# Patient Record
Sex: Male | Born: 1945 | Race: White | Hispanic: No | Smoking: Never smoker
Health system: Southern US, Community
[De-identification: ages and names within clinical notes are randomized; demographics above are authoritative.]

## PROBLEM LIST (undated history)

## (undated) DIAGNOSIS — D129 Benign neoplasm of anus and anal canal: Secondary | ICD-10-CM

## (undated) DIAGNOSIS — Z9989 Dependence on other enabling machines and devices: Secondary | ICD-10-CM

## (undated) DIAGNOSIS — Z862 Personal history of diseases of the blood and blood-forming organs and certain disorders involving the immune mechanism: Secondary | ICD-10-CM

## (undated) DIAGNOSIS — M25569 Pain in unspecified knee: Secondary | ICD-10-CM

## (undated) DIAGNOSIS — G4733 Obstructive sleep apnea (adult) (pediatric): Secondary | ICD-10-CM

## (undated) DIAGNOSIS — N400 Enlarged prostate without lower urinary tract symptoms: Secondary | ICD-10-CM

## (undated) DIAGNOSIS — R0602 Shortness of breath: Secondary | ICD-10-CM

## (undated) DIAGNOSIS — E119 Type 2 diabetes mellitus without complications: Secondary | ICD-10-CM

## (undated) DIAGNOSIS — R51 Headache: Secondary | ICD-10-CM

## (undated) DIAGNOSIS — M549 Dorsalgia, unspecified: Secondary | ICD-10-CM

## (undated) DIAGNOSIS — F32A Depression, unspecified: Secondary | ICD-10-CM

## (undated) DIAGNOSIS — I639 Cerebral infarction, unspecified: Secondary | ICD-10-CM

## (undated) DIAGNOSIS — R109 Unspecified abdominal pain: Secondary | ICD-10-CM

## (undated) DIAGNOSIS — M1990 Unspecified osteoarthritis, unspecified site: Secondary | ICD-10-CM

## (undated) DIAGNOSIS — IMO0001 Reserved for inherently not codable concepts without codable children: Secondary | ICD-10-CM

## (undated) DIAGNOSIS — Z8639 Personal history of other endocrine, nutritional and metabolic disease: Secondary | ICD-10-CM

## (undated) DIAGNOSIS — D128 Benign neoplasm of rectum: Secondary | ICD-10-CM

## (undated) DIAGNOSIS — I509 Heart failure, unspecified: Secondary | ICD-10-CM

## (undated) DIAGNOSIS — I4891 Unspecified atrial fibrillation: Secondary | ICD-10-CM

## (undated) DIAGNOSIS — F329 Major depressive disorder, single episode, unspecified: Secondary | ICD-10-CM

## (undated) DIAGNOSIS — K219 Gastro-esophageal reflux disease without esophagitis: Secondary | ICD-10-CM

## (undated) DIAGNOSIS — K5730 Diverticulosis of large intestine without perforation or abscess without bleeding: Secondary | ICD-10-CM

## (undated) DIAGNOSIS — G8929 Other chronic pain: Secondary | ICD-10-CM

## (undated) DIAGNOSIS — F419 Anxiety disorder, unspecified: Secondary | ICD-10-CM

## (undated) DIAGNOSIS — H409 Unspecified glaucoma: Secondary | ICD-10-CM

## (undated) DIAGNOSIS — R609 Edema, unspecified: Secondary | ICD-10-CM

## (undated) DIAGNOSIS — M2550 Pain in unspecified joint: Secondary | ICD-10-CM

## (undated) DIAGNOSIS — C61 Malignant neoplasm of prostate: Secondary | ICD-10-CM

## (undated) DIAGNOSIS — Z972 Presence of dental prosthetic device (complete) (partial): Secondary | ICD-10-CM

## (undated) DIAGNOSIS — J189 Pneumonia, unspecified organism: Secondary | ICD-10-CM

## (undated) DIAGNOSIS — I10 Essential (primary) hypertension: Secondary | ICD-10-CM

## (undated) DIAGNOSIS — G43D0 Abdominal migraine, not intractable: Secondary | ICD-10-CM

## (undated) DIAGNOSIS — Z9889 Other specified postprocedural states: Secondary | ICD-10-CM

## (undated) DIAGNOSIS — K429 Umbilical hernia without obstruction or gangrene: Secondary | ICD-10-CM

## (undated) DIAGNOSIS — C4490 Unspecified malignant neoplasm of skin, unspecified: Secondary | ICD-10-CM

## (undated) DIAGNOSIS — I6529 Occlusion and stenosis of unspecified carotid artery: Secondary | ICD-10-CM

## (undated) DIAGNOSIS — E785 Hyperlipidemia, unspecified: Secondary | ICD-10-CM

## (undated) HISTORY — DX: Occlusion and stenosis of unspecified carotid artery: I65.29

## (undated) HISTORY — DX: Dorsalgia, unspecified: M54.9

## (undated) HISTORY — PX: TONSILLECTOMY: SUR1361

## (undated) HISTORY — PX: PROSTATE BIOPSY: SHX241

## (undated) HISTORY — DX: Depression, unspecified: F32.A

## (undated) HISTORY — DX: Dependence on other enabling machines and devices: Z99.89

## (undated) HISTORY — DX: Personal history of diseases of the blood and blood-forming organs and certain disorders involving the immune mechanism: Z86.2

## (undated) HISTORY — DX: Gastro-esophageal reflux disease without esophagitis: K21.9

## (undated) HISTORY — DX: Pain in unspecified joint: M25.50

## (undated) HISTORY — DX: Benign neoplasm of anus and anal canal: D12.9

## (undated) HISTORY — DX: Unspecified osteoarthritis, unspecified site: M19.90

## (undated) HISTORY — DX: Shortness of breath: R06.02

## (undated) HISTORY — PX: FRACTURE SURGERY: SHX138

## (undated) HISTORY — PX: OTHER SURGICAL HISTORY: SHX169

## (undated) HISTORY — PX: CARDIAC CATHETERIZATION: SHX172

## (undated) HISTORY — DX: Pain in unspecified knee: M25.569

## (undated) HISTORY — DX: Edema, unspecified: R60.9

## (undated) HISTORY — PX: SHOULDER ARTHROSCOPY: SHX128

## (undated) HISTORY — PX: CAROTID ENDARTERECTOMY: SUR193

## (undated) HISTORY — PX: APPENDECTOMY: SHX54

## (undated) HISTORY — PX: HERNIA REPAIR: SHX51

## (undated) HISTORY — DX: Anxiety disorder, unspecified: F41.9

## (undated) HISTORY — DX: Hyperlipidemia, unspecified: E78.5

## (undated) HISTORY — DX: Heart failure, unspecified: I50.9

## (undated) HISTORY — DX: Type 2 diabetes mellitus without complications: E11.9

## (undated) HISTORY — DX: Obstructive sleep apnea (adult) (pediatric): G47.33

## (undated) HISTORY — DX: Personal history of diseases of the blood and blood-forming organs and certain disorders involving the immune mechanism: Z86.39

## (undated) HISTORY — DX: Essential (primary) hypertension: I10

## (undated) HISTORY — DX: Malignant neoplasm of prostate: C61

## (undated) HISTORY — DX: Unspecified glaucoma: H40.9

## (undated) HISTORY — DX: Benign neoplasm of rectum: D12.8

## (undated) HISTORY — DX: Diverticulosis of large intestine without perforation or abscess without bleeding: K57.30

## (undated) HISTORY — DX: Major depressive disorder, single episode, unspecified: F32.9

---

## 1989-08-01 HISTORY — PX: LEG SURGERY: SHX1003

## 2000-02-19 ENCOUNTER — Inpatient Hospital Stay (HOSPITAL_COMMUNITY): Admission: EM | Admit: 2000-02-19 | Discharge: 2000-02-23 | Payer: Self-pay | Admitting: Family Medicine

## 2000-02-19 ENCOUNTER — Encounter: Payer: Self-pay | Admitting: Family Medicine

## 2000-02-19 ENCOUNTER — Encounter: Payer: Self-pay | Admitting: Emergency Medicine

## 2000-02-29 ENCOUNTER — Encounter: Admission: RE | Admit: 2000-02-29 | Discharge: 2000-02-29 | Payer: Self-pay | Admitting: Family Medicine

## 2000-03-03 ENCOUNTER — Encounter: Admission: RE | Admit: 2000-03-03 | Discharge: 2000-03-03 | Payer: Self-pay | Admitting: Family Medicine

## 2000-04-05 ENCOUNTER — Encounter: Admission: RE | Admit: 2000-04-05 | Discharge: 2000-04-05 | Payer: Self-pay | Admitting: Family Medicine

## 2000-05-04 ENCOUNTER — Encounter: Admission: RE | Admit: 2000-05-04 | Discharge: 2000-05-04 | Payer: Self-pay | Admitting: Family Medicine

## 2000-05-31 ENCOUNTER — Encounter: Admission: RE | Admit: 2000-05-31 | Discharge: 2000-05-31 | Payer: Self-pay | Admitting: Family Medicine

## 2000-06-30 ENCOUNTER — Encounter: Admission: RE | Admit: 2000-06-30 | Discharge: 2000-06-30 | Payer: Self-pay | Admitting: *Deleted

## 2000-06-30 ENCOUNTER — Encounter: Admission: RE | Admit: 2000-06-30 | Discharge: 2000-06-30 | Payer: Self-pay | Admitting: Family Medicine

## 2000-06-30 ENCOUNTER — Encounter: Payer: Self-pay | Admitting: *Deleted

## 2000-07-11 ENCOUNTER — Encounter: Admission: RE | Admit: 2000-07-11 | Discharge: 2000-07-11 | Payer: Self-pay | Admitting: Family Medicine

## 2000-08-01 HISTORY — PX: SHOULDER ARTHROSCOPY: SHX128

## 2000-08-31 ENCOUNTER — Encounter: Admission: RE | Admit: 2000-08-31 | Discharge: 2000-08-31 | Payer: Self-pay | Admitting: Family Medicine

## 2000-09-26 ENCOUNTER — Encounter: Admission: RE | Admit: 2000-09-26 | Discharge: 2000-09-26 | Payer: Self-pay | Admitting: Family Medicine

## 2000-10-02 ENCOUNTER — Encounter: Admission: RE | Admit: 2000-10-02 | Discharge: 2000-10-02 | Payer: Self-pay | Admitting: Family Medicine

## 2000-10-23 ENCOUNTER — Encounter: Admission: RE | Admit: 2000-10-23 | Discharge: 2000-10-23 | Payer: Self-pay | Admitting: Family Medicine

## 2000-11-21 ENCOUNTER — Encounter: Admission: RE | Admit: 2000-11-21 | Discharge: 2000-11-21 | Payer: Self-pay | Admitting: Family Medicine

## 2000-12-12 ENCOUNTER — Inpatient Hospital Stay (HOSPITAL_COMMUNITY): Admission: EM | Admit: 2000-12-12 | Discharge: 2000-12-14 | Payer: Self-pay | Admitting: Emergency Medicine

## 2000-12-12 ENCOUNTER — Encounter: Payer: Self-pay | Admitting: Emergency Medicine

## 2000-12-21 ENCOUNTER — Encounter: Admission: RE | Admit: 2000-12-21 | Discharge: 2000-12-21 | Payer: Self-pay | Admitting: Family Medicine

## 2000-12-29 ENCOUNTER — Encounter: Admission: RE | Admit: 2000-12-29 | Discharge: 2000-12-29 | Payer: Self-pay | Admitting: Family Medicine

## 2001-03-30 ENCOUNTER — Encounter: Admission: RE | Admit: 2001-03-30 | Discharge: 2001-03-30 | Payer: Self-pay | Admitting: Family Medicine

## 2001-04-16 ENCOUNTER — Encounter: Admission: RE | Admit: 2001-04-16 | Discharge: 2001-04-16 | Payer: Self-pay | Admitting: Family Medicine

## 2001-05-02 ENCOUNTER — Encounter: Admission: RE | Admit: 2001-05-02 | Discharge: 2001-05-02 | Payer: Self-pay | Admitting: Sports Medicine

## 2001-05-16 ENCOUNTER — Encounter: Admission: RE | Admit: 2001-05-16 | Discharge: 2001-05-16 | Payer: Self-pay | Admitting: Family Medicine

## 2001-05-18 ENCOUNTER — Encounter: Admission: RE | Admit: 2001-05-18 | Discharge: 2001-05-18 | Payer: Self-pay | Admitting: Family Medicine

## 2001-05-18 ENCOUNTER — Encounter: Payer: Self-pay | Admitting: Sports Medicine

## 2001-05-30 ENCOUNTER — Encounter: Admission: RE | Admit: 2001-05-30 | Discharge: 2001-05-30 | Payer: Self-pay | Admitting: Family Medicine

## 2001-06-06 ENCOUNTER — Encounter: Admission: RE | Admit: 2001-06-06 | Discharge: 2001-06-06 | Payer: Self-pay | Admitting: Family Medicine

## 2001-06-19 ENCOUNTER — Encounter: Admission: RE | Admit: 2001-06-19 | Discharge: 2001-06-19 | Payer: Self-pay | Admitting: Family Medicine

## 2001-06-22 ENCOUNTER — Encounter: Admission: RE | Admit: 2001-06-22 | Discharge: 2001-06-22 | Payer: Self-pay | Admitting: Family Medicine

## 2001-07-19 ENCOUNTER — Encounter: Admission: RE | Admit: 2001-07-19 | Discharge: 2001-07-19 | Payer: Self-pay | Admitting: Family Medicine

## 2001-08-02 ENCOUNTER — Encounter: Admission: RE | Admit: 2001-08-02 | Discharge: 2001-08-02 | Payer: Self-pay | Admitting: Family Medicine

## 2001-08-15 ENCOUNTER — Encounter: Admission: RE | Admit: 2001-08-15 | Discharge: 2001-08-15 | Payer: Self-pay | Admitting: Family Medicine

## 2001-08-29 ENCOUNTER — Encounter: Admission: RE | Admit: 2001-08-29 | Discharge: 2001-08-29 | Payer: Self-pay | Admitting: Family Medicine

## 2001-09-13 ENCOUNTER — Encounter: Admission: RE | Admit: 2001-09-13 | Discharge: 2001-09-13 | Payer: Self-pay | Admitting: Family Medicine

## 2001-10-03 ENCOUNTER — Encounter: Admission: RE | Admit: 2001-10-03 | Discharge: 2001-10-03 | Payer: Self-pay | Admitting: Family Medicine

## 2001-10-17 ENCOUNTER — Encounter: Admission: RE | Admit: 2001-10-17 | Discharge: 2001-10-17 | Payer: Self-pay | Admitting: Family Medicine

## 2001-10-29 ENCOUNTER — Encounter: Admission: RE | Admit: 2001-10-29 | Discharge: 2001-10-29 | Payer: Self-pay | Admitting: Family Medicine

## 2001-12-13 ENCOUNTER — Encounter: Admission: RE | Admit: 2001-12-13 | Discharge: 2001-12-13 | Payer: Self-pay | Admitting: Family Medicine

## 2001-12-14 ENCOUNTER — Encounter: Admission: RE | Admit: 2001-12-14 | Discharge: 2001-12-14 | Payer: Self-pay | Admitting: Family Medicine

## 2001-12-18 ENCOUNTER — Encounter: Admission: RE | Admit: 2001-12-18 | Discharge: 2001-12-18 | Payer: Self-pay | Admitting: Family Medicine

## 2002-01-02 ENCOUNTER — Encounter: Admission: RE | Admit: 2002-01-02 | Discharge: 2002-01-02 | Payer: Self-pay | Admitting: Family Medicine

## 2002-01-17 ENCOUNTER — Encounter: Admission: RE | Admit: 2002-01-17 | Discharge: 2002-01-17 | Payer: Self-pay | Admitting: Family Medicine

## 2002-02-13 ENCOUNTER — Encounter: Admission: RE | Admit: 2002-02-13 | Discharge: 2002-02-13 | Payer: Self-pay | Admitting: Family Medicine

## 2002-02-21 ENCOUNTER — Encounter: Admission: RE | Admit: 2002-02-21 | Discharge: 2002-02-21 | Payer: Self-pay | Admitting: Sports Medicine

## 2002-03-06 ENCOUNTER — Encounter: Admission: RE | Admit: 2002-03-06 | Discharge: 2002-03-06 | Payer: Self-pay | Admitting: Family Medicine

## 2002-03-20 ENCOUNTER — Encounter: Admission: RE | Admit: 2002-03-20 | Discharge: 2002-03-20 | Payer: Self-pay | Admitting: Family Medicine

## 2002-04-17 ENCOUNTER — Encounter: Admission: RE | Admit: 2002-04-17 | Discharge: 2002-04-17 | Payer: Self-pay | Admitting: Family Medicine

## 2002-05-01 ENCOUNTER — Encounter: Admission: RE | Admit: 2002-05-01 | Discharge: 2002-05-01 | Payer: Self-pay | Admitting: Family Medicine

## 2002-05-16 ENCOUNTER — Encounter: Admission: RE | Admit: 2002-05-16 | Discharge: 2002-05-16 | Payer: Self-pay | Admitting: Family Medicine

## 2002-05-22 ENCOUNTER — Encounter: Admission: RE | Admit: 2002-05-22 | Discharge: 2002-05-22 | Payer: Self-pay | Admitting: Family Medicine

## 2002-06-21 ENCOUNTER — Encounter: Admission: RE | Admit: 2002-06-21 | Discharge: 2002-06-21 | Payer: Self-pay | Admitting: Family Medicine

## 2002-07-04 ENCOUNTER — Encounter: Admission: RE | Admit: 2002-07-04 | Discharge: 2002-07-04 | Payer: Self-pay | Admitting: Sports Medicine

## 2002-08-19 ENCOUNTER — Encounter: Admission: RE | Admit: 2002-08-19 | Discharge: 2002-08-19 | Payer: Self-pay | Admitting: Sports Medicine

## 2002-08-30 ENCOUNTER — Encounter: Admission: RE | Admit: 2002-08-30 | Discharge: 2002-08-30 | Payer: Self-pay | Admitting: Family Medicine

## 2002-12-16 ENCOUNTER — Inpatient Hospital Stay (HOSPITAL_COMMUNITY): Admission: EM | Admit: 2002-12-16 | Discharge: 2002-12-18 | Payer: Self-pay | Admitting: Emergency Medicine

## 2002-12-18 ENCOUNTER — Encounter: Payer: Self-pay | Admitting: Sports Medicine

## 2002-12-25 ENCOUNTER — Encounter: Admission: RE | Admit: 2002-12-25 | Discharge: 2002-12-25 | Payer: Self-pay | Admitting: Family Medicine

## 2002-12-31 ENCOUNTER — Encounter: Admission: RE | Admit: 2002-12-31 | Discharge: 2002-12-31 | Payer: Self-pay | Admitting: Family Medicine

## 2003-01-23 ENCOUNTER — Encounter: Admission: RE | Admit: 2003-01-23 | Discharge: 2003-01-23 | Payer: Self-pay | Admitting: Family Medicine

## 2003-02-17 ENCOUNTER — Encounter: Payer: Self-pay | Admitting: Otolaryngology

## 2003-02-17 ENCOUNTER — Ambulatory Visit (HOSPITAL_COMMUNITY): Admission: RE | Admit: 2003-02-17 | Discharge: 2003-02-17 | Payer: Self-pay | Admitting: Otolaryngology

## 2003-06-09 ENCOUNTER — Encounter: Admission: RE | Admit: 2003-06-09 | Discharge: 2003-06-09 | Payer: Self-pay | Admitting: Family Medicine

## 2003-09-16 ENCOUNTER — Encounter: Admission: RE | Admit: 2003-09-16 | Discharge: 2003-09-16 | Payer: Self-pay | Admitting: Family Medicine

## 2003-10-13 ENCOUNTER — Encounter: Admission: RE | Admit: 2003-10-13 | Discharge: 2003-10-13 | Payer: Self-pay | Admitting: Family Medicine

## 2003-12-09 ENCOUNTER — Encounter: Admission: RE | Admit: 2003-12-09 | Discharge: 2003-12-09 | Payer: Self-pay | Admitting: Family Medicine

## 2004-01-15 ENCOUNTER — Ambulatory Visit (HOSPITAL_COMMUNITY): Admission: RE | Admit: 2004-01-15 | Discharge: 2004-01-15 | Payer: Self-pay | Admitting: Preventative Medicine

## 2004-02-20 ENCOUNTER — Encounter: Admission: RE | Admit: 2004-02-20 | Discharge: 2004-02-20 | Payer: Self-pay | Admitting: Family Medicine

## 2004-03-10 ENCOUNTER — Ambulatory Visit (HOSPITAL_COMMUNITY): Admission: RE | Admit: 2004-03-10 | Discharge: 2004-03-10 | Payer: Self-pay | Admitting: Gastroenterology

## 2004-03-10 ENCOUNTER — Encounter (INDEPENDENT_AMBULATORY_CARE_PROVIDER_SITE_OTHER): Payer: Self-pay | Admitting: *Deleted

## 2004-03-10 DIAGNOSIS — K5730 Diverticulosis of large intestine without perforation or abscess without bleeding: Secondary | ICD-10-CM

## 2004-03-10 DIAGNOSIS — D128 Benign neoplasm of rectum: Secondary | ICD-10-CM

## 2004-03-10 HISTORY — DX: Benign neoplasm of rectum: D12.8

## 2004-03-10 HISTORY — DX: Diverticulosis of large intestine without perforation or abscess without bleeding: K57.30

## 2004-08-01 HISTORY — PX: TENDON REPAIR: SHX5111

## 2004-12-22 ENCOUNTER — Ambulatory Visit (HOSPITAL_COMMUNITY): Admission: RE | Admit: 2004-12-22 | Discharge: 2004-12-22 | Payer: Self-pay | Admitting: Family Medicine

## 2004-12-22 ENCOUNTER — Ambulatory Visit: Payer: Self-pay | Admitting: Family Medicine

## 2004-12-29 ENCOUNTER — Ambulatory Visit: Payer: Self-pay | Admitting: Family Medicine

## 2005-05-03 ENCOUNTER — Ambulatory Visit: Payer: Self-pay | Admitting: Family Medicine

## 2005-05-25 ENCOUNTER — Ambulatory Visit: Payer: Self-pay | Admitting: Family Medicine

## 2005-06-08 ENCOUNTER — Ambulatory Visit: Payer: Self-pay | Admitting: Family Medicine

## 2005-07-08 ENCOUNTER — Ambulatory Visit: Payer: Self-pay | Admitting: Family Medicine

## 2005-08-09 ENCOUNTER — Ambulatory Visit: Payer: Self-pay | Admitting: Sports Medicine

## 2005-09-16 ENCOUNTER — Ambulatory Visit: Payer: Self-pay | Admitting: Sports Medicine

## 2005-09-30 ENCOUNTER — Ambulatory Visit: Payer: Self-pay | Admitting: Sports Medicine

## 2005-11-02 ENCOUNTER — Ambulatory Visit: Payer: Self-pay | Admitting: Family Medicine

## 2005-12-09 ENCOUNTER — Ambulatory Visit: Payer: Self-pay | Admitting: Family Medicine

## 2006-06-06 ENCOUNTER — Ambulatory Visit: Payer: Self-pay | Admitting: Family Medicine

## 2006-06-13 ENCOUNTER — Ambulatory Visit: Payer: Self-pay | Admitting: Family Medicine

## 2006-06-21 ENCOUNTER — Ambulatory Visit: Payer: Self-pay | Admitting: Family Medicine

## 2006-06-27 ENCOUNTER — Ambulatory Visit: Payer: Self-pay | Admitting: Psychology

## 2006-07-05 ENCOUNTER — Ambulatory Visit: Payer: Self-pay | Admitting: Family Medicine

## 2006-08-04 ENCOUNTER — Encounter: Admission: RE | Admit: 2006-08-04 | Discharge: 2006-08-04 | Payer: Self-pay | Admitting: Sports Medicine

## 2006-08-07 ENCOUNTER — Ambulatory Visit: Payer: Self-pay | Admitting: Psychology

## 2006-09-04 ENCOUNTER — Ambulatory Visit: Payer: Self-pay | Admitting: Psychology

## 2006-09-28 DIAGNOSIS — D179 Benign lipomatous neoplasm, unspecified: Secondary | ICD-10-CM | POA: Insufficient documentation

## 2006-09-28 DIAGNOSIS — K5732 Diverticulitis of large intestine without perforation or abscess without bleeding: Secondary | ICD-10-CM | POA: Insufficient documentation

## 2006-09-28 DIAGNOSIS — G44209 Tension-type headache, unspecified, not intractable: Secondary | ICD-10-CM

## 2006-09-28 DIAGNOSIS — E7800 Pure hypercholesterolemia, unspecified: Secondary | ICD-10-CM

## 2006-09-28 DIAGNOSIS — K219 Gastro-esophageal reflux disease without esophagitis: Secondary | ICD-10-CM

## 2006-09-28 DIAGNOSIS — G2589 Other specified extrapyramidal and movement disorders: Secondary | ICD-10-CM

## 2006-09-28 DIAGNOSIS — F5232 Male orgasmic disorder: Secondary | ICD-10-CM

## 2006-09-28 DIAGNOSIS — D126 Benign neoplasm of colon, unspecified: Secondary | ICD-10-CM

## 2006-09-28 DIAGNOSIS — E785 Hyperlipidemia, unspecified: Secondary | ICD-10-CM | POA: Insufficient documentation

## 2006-09-28 DIAGNOSIS — E669 Obesity, unspecified: Secondary | ICD-10-CM

## 2006-09-28 DIAGNOSIS — F329 Major depressive disorder, single episode, unspecified: Secondary | ICD-10-CM

## 2006-09-28 DIAGNOSIS — F528 Other sexual dysfunction not due to a substance or known physiological condition: Secondary | ICD-10-CM

## 2006-10-30 ENCOUNTER — Telehealth: Payer: Self-pay | Admitting: *Deleted

## 2006-11-01 ENCOUNTER — Ambulatory Visit: Payer: Self-pay | Admitting: Family Medicine

## 2006-11-01 DIAGNOSIS — K429 Umbilical hernia without obstruction or gangrene: Secondary | ICD-10-CM | POA: Insufficient documentation

## 2006-11-20 ENCOUNTER — Encounter: Admission: RE | Admit: 2006-11-20 | Discharge: 2006-11-20 | Payer: Self-pay | Admitting: General Surgery

## 2006-11-22 ENCOUNTER — Ambulatory Visit (HOSPITAL_BASED_OUTPATIENT_CLINIC_OR_DEPARTMENT_OTHER): Admission: RE | Admit: 2006-11-22 | Discharge: 2006-11-22 | Payer: Self-pay | Admitting: General Surgery

## 2006-11-29 ENCOUNTER — Encounter: Payer: Self-pay | Admitting: Family Medicine

## 2007-04-17 ENCOUNTER — Encounter: Payer: Self-pay | Admitting: Family Medicine

## 2007-04-19 ENCOUNTER — Encounter: Payer: Self-pay | Admitting: Family Medicine

## 2007-04-19 ENCOUNTER — Ambulatory Visit: Payer: Self-pay | Admitting: Family Medicine

## 2007-04-19 DIAGNOSIS — R609 Edema, unspecified: Secondary | ICD-10-CM

## 2007-04-19 LAB — CONVERTED CEMR LAB
AST: 17 units/L (ref 0–37)
CO2: 23 meq/L (ref 19–32)
Creatinine, Ser: 1.15 mg/dL (ref 0.40–1.50)
Direct LDL: 189 mg/dL — ABNORMAL HIGH
Glucose, Bld: 88 mg/dL (ref 70–99)
MCHC: 32.4 g/dL (ref 30.0–36.0)
MCV: 87.7 fL (ref 78.0–100.0)
RBC: 5.94 M/uL — ABNORMAL HIGH (ref 4.22–5.81)
RDW: 14.5 % — ABNORMAL HIGH (ref 11.5–14.0)
Total Protein: 6.8 g/dL (ref 6.0–8.3)

## 2007-04-20 ENCOUNTER — Encounter: Payer: Self-pay | Admitting: Family Medicine

## 2007-04-30 ENCOUNTER — Telehealth: Payer: Self-pay | Admitting: *Deleted

## 2007-05-25 ENCOUNTER — Ambulatory Visit: Payer: Self-pay | Admitting: Family Medicine

## 2007-10-16 ENCOUNTER — Encounter (INDEPENDENT_AMBULATORY_CARE_PROVIDER_SITE_OTHER): Payer: Self-pay | Admitting: *Deleted

## 2007-10-16 ENCOUNTER — Encounter: Admission: RE | Admit: 2007-10-16 | Discharge: 2007-10-16 | Payer: Self-pay | Admitting: *Deleted

## 2007-10-16 ENCOUNTER — Ambulatory Visit: Payer: Self-pay | Admitting: Family Medicine

## 2007-10-16 DIAGNOSIS — M79609 Pain in unspecified limb: Secondary | ICD-10-CM

## 2007-10-16 LAB — CONVERTED CEMR LAB: Anti Nuclear Antibody(ANA): NEGATIVE

## 2007-11-09 ENCOUNTER — Ambulatory Visit: Payer: Self-pay | Admitting: Psychology

## 2007-11-14 ENCOUNTER — Ambulatory Visit: Payer: Self-pay | Admitting: Psychology

## 2007-11-15 ENCOUNTER — Encounter: Payer: Self-pay | Admitting: Family Medicine

## 2007-11-15 ENCOUNTER — Ambulatory Visit: Payer: Self-pay | Admitting: Family Medicine

## 2007-11-15 LAB — CONVERTED CEMR LAB
AST: 19 units/L (ref 0–37)
CO2: 24 meq/L (ref 19–32)
Calcium: 8.9 mg/dL (ref 8.4–10.5)
Chloride: 106 meq/L (ref 96–112)
Direct LDL: 148 mg/dL — ABNORMAL HIGH
Total Bilirubin: 0.5 mg/dL (ref 0.3–1.2)
Total Protein: 6.4 g/dL (ref 6.0–8.3)

## 2007-11-26 ENCOUNTER — Ambulatory Visit: Payer: Self-pay | Admitting: Psychology

## 2007-11-27 ENCOUNTER — Encounter: Payer: Self-pay | Admitting: Family Medicine

## 2007-12-11 ENCOUNTER — Ambulatory Visit: Payer: Self-pay | Admitting: Psychology

## 2007-12-13 ENCOUNTER — Encounter: Payer: Self-pay | Admitting: Family Medicine

## 2007-12-27 ENCOUNTER — Ambulatory Visit: Payer: Self-pay | Admitting: Family Medicine

## 2007-12-27 ENCOUNTER — Encounter: Payer: Self-pay | Admitting: Family Medicine

## 2007-12-27 ENCOUNTER — Encounter: Payer: Self-pay | Admitting: *Deleted

## 2007-12-27 LAB — CONVERTED CEMR LAB: PSA: 2.81 ng/mL (ref 0.10–4.00)

## 2007-12-31 ENCOUNTER — Telehealth: Payer: Self-pay | Admitting: *Deleted

## 2008-01-01 ENCOUNTER — Ambulatory Visit: Payer: Self-pay | Admitting: Psychology

## 2008-01-01 ENCOUNTER — Telehealth: Payer: Self-pay | Admitting: *Deleted

## 2008-01-03 ENCOUNTER — Telehealth: Payer: Self-pay | Admitting: *Deleted

## 2008-01-16 ENCOUNTER — Ambulatory Visit: Payer: Self-pay | Admitting: Psychology

## 2008-01-25 ENCOUNTER — Ambulatory Visit: Payer: Self-pay | Admitting: Psychology

## 2008-02-12 ENCOUNTER — Ambulatory Visit: Payer: Self-pay | Admitting: Psychology

## 2008-02-27 ENCOUNTER — Ambulatory Visit: Payer: Self-pay | Admitting: Psychology

## 2008-03-12 ENCOUNTER — Ambulatory Visit: Payer: Self-pay | Admitting: Psychology

## 2008-03-26 ENCOUNTER — Ambulatory Visit: Payer: Self-pay | Admitting: Psychology

## 2008-04-16 ENCOUNTER — Telehealth: Payer: Self-pay | Admitting: *Deleted

## 2008-04-23 ENCOUNTER — Ambulatory Visit: Payer: Self-pay | Admitting: Psychology

## 2008-05-21 ENCOUNTER — Ambulatory Visit: Payer: Self-pay | Admitting: Psychology

## 2008-05-28 ENCOUNTER — Ambulatory Visit: Payer: Self-pay | Admitting: Family Medicine

## 2008-05-28 DIAGNOSIS — I10 Essential (primary) hypertension: Secondary | ICD-10-CM

## 2008-05-29 ENCOUNTER — Ambulatory Visit: Payer: Self-pay | Admitting: Family Medicine

## 2008-05-29 ENCOUNTER — Encounter: Payer: Self-pay | Admitting: Family Medicine

## 2008-05-29 LAB — CONVERTED CEMR LAB
CO2: 22 meq/L (ref 19–32)
Chloride: 105 meq/L (ref 96–112)
Cholesterol: 190 mg/dL (ref 0–200)
Glucose, Bld: 100 mg/dL — ABNORMAL HIGH (ref 70–99)
Potassium: 4.5 meq/L (ref 3.5–5.3)
Sodium: 139 meq/L (ref 135–145)
Total CHOL/HDL Ratio: 5.3
VLDL: 22 mg/dL (ref 0–40)

## 2008-06-04 ENCOUNTER — Ambulatory Visit: Payer: Self-pay | Admitting: Psychology

## 2008-06-09 ENCOUNTER — Encounter: Payer: Self-pay | Admitting: Family Medicine

## 2008-06-11 ENCOUNTER — Telehealth: Payer: Self-pay | Admitting: Family Medicine

## 2008-06-12 ENCOUNTER — Telehealth: Payer: Self-pay | Admitting: Family Medicine

## 2008-06-18 ENCOUNTER — Ambulatory Visit: Payer: Self-pay | Admitting: Psychology

## 2008-06-19 ENCOUNTER — Ambulatory Visit: Payer: Self-pay | Admitting: Family Medicine

## 2008-07-02 ENCOUNTER — Ambulatory Visit: Payer: Self-pay | Admitting: Psychology

## 2008-07-16 ENCOUNTER — Ambulatory Visit: Payer: Self-pay | Admitting: Psychology

## 2008-07-21 ENCOUNTER — Ambulatory Visit: Payer: Self-pay | Admitting: Family Medicine

## 2008-08-20 ENCOUNTER — Ambulatory Visit: Payer: Self-pay | Admitting: Psychology

## 2008-09-10 ENCOUNTER — Ambulatory Visit: Payer: Self-pay | Admitting: Psychology

## 2008-09-24 ENCOUNTER — Ambulatory Visit: Payer: Self-pay | Admitting: Psychology

## 2008-10-22 ENCOUNTER — Ambulatory Visit: Payer: Self-pay | Admitting: Psychology

## 2008-11-05 ENCOUNTER — Ambulatory Visit: Payer: Self-pay | Admitting: Psychology

## 2008-12-03 ENCOUNTER — Ambulatory Visit: Payer: Self-pay | Admitting: Psychology

## 2008-12-17 ENCOUNTER — Ambulatory Visit: Payer: Self-pay | Admitting: Psychology

## 2009-02-04 ENCOUNTER — Ambulatory Visit: Payer: Self-pay | Admitting: Psychology

## 2009-02-11 ENCOUNTER — Ambulatory Visit: Payer: Self-pay | Admitting: Psychology

## 2009-03-06 ENCOUNTER — Ambulatory Visit: Payer: Self-pay | Admitting: Psychology

## 2009-03-25 ENCOUNTER — Ambulatory Visit: Payer: Self-pay | Admitting: Psychology

## 2009-03-30 ENCOUNTER — Telehealth: Payer: Self-pay | Admitting: Family Medicine

## 2009-04-22 ENCOUNTER — Ambulatory Visit: Payer: Self-pay | Admitting: Psychology

## 2009-04-23 ENCOUNTER — Ambulatory Visit (HOSPITAL_BASED_OUTPATIENT_CLINIC_OR_DEPARTMENT_OTHER): Admission: RE | Admit: 2009-04-23 | Discharge: 2009-04-23 | Payer: Self-pay | Admitting: Orthopedic Surgery

## 2009-05-04 ENCOUNTER — Telehealth: Payer: Self-pay | Admitting: Family Medicine

## 2009-05-28 ENCOUNTER — Ambulatory Visit: Payer: Self-pay | Admitting: Psychology

## 2009-07-01 ENCOUNTER — Ambulatory Visit: Payer: Self-pay | Admitting: Psychology

## 2009-07-15 ENCOUNTER — Ambulatory Visit: Payer: Self-pay | Admitting: Psychology

## 2009-08-12 ENCOUNTER — Ambulatory Visit: Payer: Self-pay | Admitting: Psychology

## 2009-10-06 ENCOUNTER — Ambulatory Visit: Payer: Self-pay | Admitting: Psychology

## 2009-10-16 ENCOUNTER — Encounter: Payer: Self-pay | Admitting: Family Medicine

## 2009-10-21 ENCOUNTER — Ambulatory Visit: Payer: Self-pay | Admitting: Psychology

## 2009-11-04 ENCOUNTER — Ambulatory Visit: Payer: Self-pay | Admitting: Psychology

## 2009-11-09 ENCOUNTER — Ambulatory Visit: Payer: Self-pay | Admitting: Family Medicine

## 2009-11-09 ENCOUNTER — Encounter: Payer: Self-pay | Admitting: Family Medicine

## 2009-11-09 LAB — CONVERTED CEMR LAB
AST: 18 units/L (ref 0–37)
Albumin: 4.5 g/dL (ref 3.5–5.2)
Alkaline Phosphatase: 54 units/L (ref 39–117)
Glucose, Bld: 99 mg/dL (ref 70–99)
LDL Cholesterol: 136 mg/dL — ABNORMAL HIGH (ref 0–99)
MCHC: 32.5 g/dL (ref 30.0–36.0)
Potassium: 3.8 meq/L (ref 3.5–5.3)
RBC: 5.59 M/uL (ref 4.22–5.81)
Sodium: 140 meq/L (ref 135–145)
Total Protein: 6.3 g/dL (ref 6.0–8.3)
Triglycerides: 138 mg/dL (ref <150)
WBC: 5.5 10*3/uL (ref 4.0–10.5)

## 2009-11-13 ENCOUNTER — Telehealth: Payer: Self-pay | Admitting: Family Medicine

## 2009-11-26 ENCOUNTER — Telehealth: Payer: Self-pay | Admitting: Family Medicine

## 2009-12-08 ENCOUNTER — Encounter: Payer: Self-pay | Admitting: Family Medicine

## 2009-12-16 ENCOUNTER — Ambulatory Visit: Payer: Self-pay | Admitting: Psychology

## 2010-01-13 ENCOUNTER — Ambulatory Visit: Payer: Self-pay | Admitting: Psychology

## 2010-02-10 ENCOUNTER — Ambulatory Visit: Payer: Self-pay | Admitting: Psychology

## 2010-02-11 ENCOUNTER — Encounter: Payer: Self-pay | Admitting: Sports Medicine

## 2010-02-11 ENCOUNTER — Ambulatory Visit: Payer: Self-pay | Admitting: Family Medicine

## 2010-02-11 DIAGNOSIS — F4321 Adjustment disorder with depressed mood: Secondary | ICD-10-CM | POA: Insufficient documentation

## 2010-02-24 ENCOUNTER — Ambulatory Visit: Payer: Self-pay | Admitting: Family Medicine

## 2010-03-10 ENCOUNTER — Ambulatory Visit: Payer: Self-pay | Admitting: Psychology

## 2010-03-14 ENCOUNTER — Encounter: Payer: Self-pay | Admitting: Sports Medicine

## 2010-03-29 ENCOUNTER — Ambulatory Visit: Payer: Self-pay | Admitting: Family Medicine

## 2010-05-06 ENCOUNTER — Ambulatory Visit: Payer: Self-pay | Admitting: Psychology

## 2010-06-02 ENCOUNTER — Ambulatory Visit: Payer: Self-pay | Admitting: Psychology

## 2010-07-28 ENCOUNTER — Ambulatory Visit: Payer: Self-pay | Admitting: Psychology

## 2010-08-01 HISTORY — PX: COLONOSCOPY: SHX174

## 2010-08-22 ENCOUNTER — Encounter: Payer: Self-pay | Admitting: Orthopedic Surgery

## 2010-08-25 ENCOUNTER — Ambulatory Visit
Admission: RE | Admit: 2010-08-25 | Discharge: 2010-08-25 | Payer: Self-pay | Source: Home / Self Care | Attending: Psychology | Admitting: Psychology

## 2010-08-30 ENCOUNTER — Telehealth: Payer: Self-pay | Admitting: *Deleted

## 2010-08-31 NOTE — Letter (Signed)
Summary: Handout Printed  Printed Handout:  - Grief Reaction

## 2010-08-31 NOTE — Assessment & Plan Note (Signed)
Summary: f/up,tcb   Vital Signs:  Patient profile:   65 year old male Weight:      299.1 pounds Temp:     98.1 degrees F oral Pulse rate:   68 / minute Pulse rhythm:   regular BP sitting:   144 / 92  (left arm) Cuff size:   large  Vitals Entered By: Loralee Pacas CMA (November 09, 2009 10:53 AM)  Primary Care Provider:  Asher Muir MD   History of Present Illness: Here for routine follow up.  discussed:  1.  hypertension--high today with sbp in 140s.  consitent even on manual recheck.  on hctz 25.  2.  hypercholesterolemia--on zetia.  due for flp.  tolerating meds well.    3.  depression--doing well on citalopram 40.  wife has metastatic breast cancer.  it has been difficult, but he is holding up well under the circumstances.  4.  gerd--on protonix.    Current Medications (verified): 1)  Pantoprazole Sodium 40 Mg  Tbec (Pantoprazole Sodium) .[name].. 1 By Mouth Once Daily 2)  Ibuprofen 600 Mg  Tabs (Ibuprofen) .[name].. 1 By Mouth Three Times A Day As Needed Pain 3)  Zetia 10 Mg  Tabs (Ezetimibe) .[name].. 1 Tab By Mouth At Bedtime. 4)  Restoril 15 Mg Caps (Temazepam) .... Take 1 By Mouth At Bedtime As Needed Sleep 5)  Baby Aspirin 81 Mg Chew (Aspirin) .... Take 1 By Mouth Daily 6)  Hydrochlorothiazide 25 Mg Tabs (Hydrochlorothiazide) .... Take 1 Tab By Mouth Daily 7)  Citalopram Hydrobromide 40 Mg Tabs (Citalopram Hydrobromide) .[name].. 1 Tab By Mouth Daily  Allergies: No Known Drug Allergies  Review of Systems       The patient complains of weight gain.  The patient denies anorexia, chest pain, dyspnea on exertion, and abdominal pain.   Psych:  Denies suicidal thoughts/plans.  Physical Exam  General:  Well-developed,overweight,in no acute distress; alert,appropriate and cooperative throughout examination Lungs:  Normal respiratory effort, chest expands symmetrically. Lungs are clear to auscultation, no crackles or wheezes. Heart:  Normal rate and regular rhythm. S1 and S2 normal  without gallop, murmur, click, rub or other extra sounds. Psych:  Oriented X3, memory intact for recent and remote, normally interactive, good eye contact, not anxious appearing, and not depressed appearing.   Additional Exam:  vital signs reviewed    Impression & Recommendations:  Problem # 1:  HYPERTENSION, BENIGN ESSENTIAL (ICD-401.1) Assessment Deteriorated add lisinopri.  recheck bmet in a few weeks His updated medication list for this problem includes:    Lisinopril-hydrochlorothiazide 20-12.5 Mg Tabs (Lisinopril-hydrochlorothiazide) .[name]... 1 tab by mouth daily for blood pressure  Orders: Comp Met-[facility] ([id number]) CBC-[facility] ([id number])Future Orders: Basic Met-[facility] ([id number]) ... 11/08/2010  Problem # 2:  HYPERCHOLESTEROLEMIA (ICD-272.0) Assessment: Unchanged check flp today His updated medication list for this problem includes:    Zetia 10 Mg Tabs (Ezetimibe) .[name]... 1 tab by mouth at bedtime.  Orders: Comp Met[facility] [id number]) Lipid-[facility] ([id number])  Problem # 3:  GASTROESOPHAGEAL REFLUX, NO ESOPHAGITIS (ICD-530.81) Assessment: Unchanged continue protonix His updated medication list for this problem includes:    Pantoprazole Sodium 40 Mg Tbec (Pantoprazole sodium) .[name]... 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  no changes The following medications were removed from the medication list:    Celexa 40 Mg Tabs (Citalopram hydrobromide) .[name]... Take 1 tablet by mouth daily His updated medication list for this problem includes:  Citalopram Hydrobromide 40 Mg Tabs (Citalopram hydrobromide) .[name]... 1 tab by mouth daily  Complete Medication List: 1)  Pantoprazole Sodium 40 Mg Tbec (Pantoprazole sodium) .[name].. 1 by mouth once daily 2)  Ibuprofen 600 Mg Tabs (Ibuprofen) .[name].. 1 by mouth three times a day as needed pain 3)  Zetia 10 Mg Tabs (Ezetimibe) .[name].. 1 tab by mouth at bedtime. 4)   Restoril 15 Mg Caps (Temazepam) .... Take 1 by mouth at bedtime as needed sleep 5)  Baby Aspirin 81 Mg Chew (Aspirin) .... Take 1 by mouth daily 6)  Lisinopril-hydrochlorothiazide 20-12.5 Mg Tabs (Lisinopril-hydrochlorothiazide) .[name].. 1 tab by mouth daily for blood pressure 7)  Citalopram Hydrobromide 40 Mg Tabs (Citalopram hydrobromide) .[name].. 1 tab by mouth daily  Other Orders: Future Orders: PSA-[facility] ([id number]) ... 11/10/2010  Patient Instructions: 1)  It was nice to see you today. 2)  I will call you or send you a letter with lab results. 3)  Please schedule a follow-up appointment in 6 months.  4)  Please schedule a lab appointment  and nurse visit for blood pressure. Prescriptions: RESTORIL 15 MG CAPS (TEMAZEPAM) take 1 by mouth at bedtime as needed sleep  #30 x 3   Entered and Authorized by:   Asher Muir MD   Signed by:   Asher Muir MD on 11/09/2009   Method used:   Print then Give to Patient   RxID:   [id number] CITALOPRAM HYDROBROMIDE 40 MG TABS (CITALOPRAM HYDROBROMIDE) 1 tab by mouth daily  #30 x 12   Entered and Authorized by:   Asher Muir MD   Signed by:   Asher Muir MD on 11/09/2009   Method used:   Electronically to        [facility] (retail)       [address]       [place], [place]  [address]       Ph: [phone]       Fax: [phone]   RxID:   [id number] ZETIA 10 MG  TABS (EZETIMIBE) 1 tab by mouth at bedtime.  #30 x 11   Entered and Authorized by:   Asher Muir MD   Signed by:   Asher Muir MD on 11/09/2009   Method used:   Electronically to        [facility] (retail)       [address]       [place], [place]  [address]       Ph: [phone]       Fax: [phone]   RxID:   [id number] LISINOPRIL-HYDROCHLOROTHIAZIDE 20-12.5 MG TABS (LISINOPRIL-HYDROCHLOROTHIAZIDE) 1 tab by mouth daily for blood pressure  #90 x 1   Entered and  Authorized by:   Asher Muir MD   Signed by:   Asher Muir MD on 11/09/2009   Method used:   Electronically to        [facility] (retail)       [address]       [place], [place]  [address]       Ph: [phone]       Fax: [phone]   RxID:   [id number]   Prevention & Chronic Care Immunizations   Influenza vaccine: Not documented    Tetanus booster: 05/29/2008: Tdap   Tetanus booster due: 11/29/2005    Pneumococcal vaccine: Done.  (  05/01/2005)   Pneumococcal vaccine due: None    H. zoster vaccine: 11/15/2007: Zostavax  Colorectal Screening   Hemoccult: Done.  (02/03/2005)   Hemoccult due: Not Indicated    Colonoscopy: Done.  (03/01/2004)   Colonoscopy due: 03/01/2014  Other Screening   PSA: 2.81  (12/27/2007)   PSA ordered.   PSA due due: 12/26/2008   Smoking status: never  (11/01/2006)  Lipids   Total Cholesterol: 190  (05/29/2008)   LDL: 132  (05/29/2008)   LDL Direct: 148  (11/15/2007)   HDL: 36  (05/29/2008)   Triglycerides: 111  (05/29/2008)    SGOT (AST): 19  (11/15/2007)   SGPT (ALT): 33  (11/15/2007) CMP ordered    Alkaline phosphatase: 54  (11/15/2007)   Total bilirubin: 0.5  (11/15/2007)    Lipid flowsheet reviewed?: Yes   Progress toward LDL goal: Unchanged    Stage of readiness to change (lipid management): Action  Hypertension   Last Blood Pressure: 144 / 92  (11/09/2009)   Serum creatinine: 1.20  (05/29/2008)   Serum potassium 4.5  (05/29/2008) CMP ordered     Hypertension flowsheet reviewed?: Yes   Progress toward BP goal: Deteriorated    Stage of readiness to change (hypertension management): Action  Self-Management Support :    Hypertension self-management support: BP self-monitoring log, Written self-care plan  (11/09/2009)   Hypertension self-care plan printed.    Lipid self-management support: Lipid monitoring log, Written self-care plan  (11/09/2009)   Lipid self-care  plan printed.  Appended Document: Orders Update    Clinical Lists Changes  Orders: Added new Test order of [facility]- Est  Level 4 ([address]) - Signed

## 2010-08-31 NOTE — Assessment & Plan Note (Signed)
Summary: f/u eo   Vital Signs:  Patient profile:   65 year old male Height:      71 inches Weight:      301 pounds BMI:     42.13 Temp:     98.1 degrees F oral Pulse rate:   81 / minute BP sitting:   108 / 73  (left arm) Cuff size:   large CC: f/u depression Is Patient Diabetic? No Pain Assessment Patient in pain? no        Primary Care Provider:  . [facility]  CC:  f/u depression.  History of Present Illness: 7M with hx depression on celexa 40 (one half tab)  Preivous Hx: Wife passed away 8 wks ago from breast Ca.  Pt feeling very sad.  Lonely.  No SI/HI.  PHQ-9 10 initially (moderate depression) Trouble sleeping.  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  Doesn't have a lot of support from the congregation.  One woman gave him a list of single women she thought he should date the day after his wife passed, this hurt him greatly.  He already has a Haematologist that he talks to, Dr. Dellia Cloud.  Tried Wellbutrin, he stopped this 2/2 abd discomfort.    Today: Feeling much better, PHQ 9 score in exam section.  Anhedonia improving.  Doing jobs around the house that he and his wife had originally planned to do.    Due for CPE.  Habits & Providers  Alcohol-Tobacco-Diet     Tobacco Status: never  Current Medications (verified): 1)  Pantoprazole Sodium 40 Mg  Tbec (Pantoprazole Sodium) .[name].. 1 By Mouth Once Daily 2)  Ibuprofen 600 Mg  Tabs (Ibuprofen) .[name].. 1 By Mouth Three Times A Day As Needed Pain 3)  Zetia 10 Mg  Tabs (Ezetimibe) .[name].. 1 Tab By Mouth At Bedtime. 4)  Restoril 15 Mg Caps (Temazepam) .... Take 1 By Mouth At Bedtime As Needed Sleep 5)  Baby Aspirin 81 Mg Chew (Aspirin) .... Take 1 By Mouth Daily 6)  Lisinopril-Hydrochlorothiazide 20-12.5 Mg Tabs (Lisinopril-Hydrochlorothiazide) .[name].. 1 Tab By Mouth Daily For Blood Pressure 7)  Citalopram Hydrobromide 40 Mg Tabs (Citalopram Hydrobromide) .[name].. 1 Tab By  Mouth Daily  Allergies (verified): No Known Drug Allergies  Review of Systems       See HPI  Physical Exam  General:  Well-developed,well-nourished,in no acute distress; alert,appropriate and cooperative throughout examination Lungs:  Normal respiratory effort, chest expands symmetrically. Lungs are clear to auscultation, no crackles or wheezes. Heart:  Normal rate and regular rhythm. S1 and S2 normal without gallop, murmur, click, rub or other extra sounds. Psych:  Cognition and judgment appear intact. Alert and cooperative with normal attention span and concentration. No apparent delusions, illusions, hallucinations Additional Exam:  PHQ-9: 2.  Not/somewhat difficult   Impression & Recommendations:  Problem # 1:  GRIEF REACTION, ACUTE (ICD-309.0) Assessment Improved Resolving as expected.  He is doing very well.  PHQ-9 score significantly improved. Cont current meds.  No changes.  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  His updated medication list for this problem includes:    Citalopram Hydrobromide 40 Mg Tabs (Citalopram hydrobromide) .[name]... 1 tab by mouth daily  Problem # 3:  HYPERTENSION, BENIGN ESSENTIAL (ICD-401.1) Assessment: Improved BP excellent, no changes.  Orders: [facility]- Est Level  3 ([address])  His updated medication list for this problem includes:    Lisinopril-hydrochlorothiazide 20-12.5 Mg Tabs (Lisinopril-hydrochlorothiazide) .[name]... 1 tab by mouth daily for blood pressure  Problem # 4:  Preventive Health Care (ICD-V70.0) Assessment: Comment Only Pt to RTC for CPE.  Complete Medication List: 1)  Pantoprazole Sodium 40 Mg Tbec (Pantoprazole sodium) .[name].. 1 by mouth once daily 2)  Ibuprofen 600 Mg Tabs (Ibuprofen) .[name].. 1 by mouth three times a day as needed pain 3)  Zetia 10 Mg Tabs (Ezetimibe) .[name].. 1 tab by mouth at bedtime. 4)  Restoril 15 Mg Caps (Temazepam) ....  Take 1 by mouth at bedtime as needed sleep 5)  Baby Aspirin 81 Mg Chew (Aspirin) .... Take 1 by mouth daily 6)  Lisinopril-hydrochlorothiazide 20-12.5 Mg Tabs (Lisinopril-hydrochlorothiazide) .[name].. 1 tab by mouth daily for blood pressure 7)  Citalopram Hydrobromide 40 Mg Tabs (Citalopram hydrobromide) .[name].. 1 tab by mouth daily

## 2010-08-31 NOTE — Letter (Signed)
Summary: Generic Letter  [facility]  [address]   [place], [place] [address]   Phone: [phone]  Fax: [phone]    10/16/2009  [name] [name] [address] [place], [place]  [address]  Dear [name] [name],   You have not been seen in our office in some time.  Please call [phone] and schedule an office visit.  We need to check your cholesterol and blood pressure.  I look forward to seeing you.         Sincerely,   Asher Muir MD  Appended Document: Generic Letter mailed.

## 2010-08-31 NOTE — Progress Notes (Signed)
  Phone Note Outgoing Call   Call placed by: Asher Muir MD,  November 26, 2009 9:11 AM Summary of Call: left vm asking pt to call back to discuss cholesterol and blood glucose Initial call taken by: Asher Muir MD,  November 26, 2009 9:11 AM

## 2010-08-31 NOTE — Progress Notes (Signed)
Summary: call to discuss labs  Phone Note Outgoing Call   Call placed by: Asher Muir MD,  November 13, 2009 9:39 AM Summary of Call: called and left vm to call me back to discuss labs.   Initial call taken by: Asher Muir MD,  November 13, 2009 9:39 AM

## 2010-08-31 NOTE — Assessment & Plan Note (Signed)
Summary: depression from wife's death,df   Vital Signs:  Patient profile:   65 year old male Weight:      298.1 pounds Temp:     98.1 degrees F oral Pulse rate:   61 / minute Pulse rhythm:   regular BP sitting:   118 / 81  (left arm) Cuff size:   large  Vitals Entered By: Loralee Pacas CMA (February 11, 2010 10:24 AM) CC: depression   Primary Care Provider:  . [facility]  CC:  depression.  History of Present Illness: 54M with hx depression on celexa 40 (one half tab)  Wife passed away 2 wks ago from breast Ca.  Pt feeling very sad.  Lonely.  No SI/HI.  PHQ-9 10 (moderate depression) Trouble sleeping.  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  Doesn't have a lot of support from the congregation.  One woman gave him a list of single women she thought he should date the day after his wife passed, this hurt him greatly.  He already has a Haematologist that he talks to, Dr. Dellia Cloud.  Would like some pharmacologic help.  Is on white team and needs PCP.  Will think about who he wants.  I offered and he said he would think about it.  I spent 25 mins with this pt counselling him on depression and grief and discussing treatments.  Current Medications (verified): 1)  Pantoprazole Sodium 40 Mg  Tbec (Pantoprazole Sodium) .[name].. 1 By Mouth Once Daily 2)  Ibuprofen 600 Mg  Tabs (Ibuprofen) .[name].. 1 By Mouth Three Times A Day As Needed Pain 3)  Zetia 10 Mg  Tabs (Ezetimibe) .[name].. 1 Tab By Mouth At Bedtime. 4)  Restoril 15 Mg Caps (Temazepam) .... Take 1 By Mouth At Bedtime As Needed Sleep 5)  Baby Aspirin 81 Mg Chew (Aspirin) .... Take 1 By Mouth Daily 6)  Lisinopril-Hydrochlorothiazide 20-12.5 Mg Tabs (Lisinopril-Hydrochlorothiazide) .[name].. 1 Tab By Mouth Daily For Blood Pressure 7)  Citalopram Hydrobromide 40 Mg Tabs (Citalopram Hydrobromide) .[name].. 1 Tab By Mouth Daily 8)  Wellbutrin Xl 300 Mg Xr24h-Tab (Bupropion Hcl) .... One Tab By Mouth  Qam  Allergies (verified): No Known Drug Allergies  Review of Systems       See HPI  Physical Exam  General:  Well-developed,well-nourished,in no acute distress; alert,appropriate and cooperative throughout examination Psych:  Oriented X3, memory intact for recent and remote, normally interactive, good eye contact, not agitated, not suicidal, not homicidal  He did have depressed affect, was tearful, and slightly anxious.     Impression & Recommendations:  Problem # 1:  GRIEF REACTION, ACUTE (ICD-309.0) Assessment New Normal grief reaction.  Will augment SSRI with Buproprion.  Pt to RTC 1-2 wks to see how he's doing.  He was very appreciative that we talked today for so long.  Normal grief: <1 year.    Orders: [facility]- Est  Level 4 ([address])  Complete Medication List: 1)  Pantoprazole Sodium 40 Mg Tbec (Pantoprazole sodium) .[name].. 1 by mouth once daily 2)  Ibuprofen 600 Mg Tabs (Ibuprofen) .[name].. 1 by mouth three times a day as needed pain 3)  Zetia 10 Mg Tabs (Ezetimibe) .[name].. 1 tab by mouth at bedtime. 4)  Restoril 15 Mg Caps (Temazepam) .... Take 1 by mouth at bedtime as needed sleep 5)  Baby Aspirin 81 Mg Chew (Aspirin) .... Take 1 by mouth daily 6)  Lisinopril-hydrochlorothiazide 20-12.5 Mg Tabs (  Lisinopril-hydrochlorothiazide) .[name].. 1 tab by mouth daily for blood pressure 7)  Citalopram Hydrobromide 40 Mg Tabs (Citalopram hydrobromide) .[name].. 1 tab by mouth daily 8)  Wellbutrin Xl 300 Mg Xr24h-tab (Bupropion hcl) .... One tab by mouth qam  Patient Instructions: 1)  Great to meet you, 2)  I am very sorry to hear about your loss, the grieving process can take a long time. 3)  Lets add Wellbutrin XL to your medicines. 4)  Come back to see me in 1-2 weeks to see how you are doing. 5)  -Dr. Karie Schwalbe. Prescriptions: WELLBUTRIN XL 300 MG XR24H-TAB (BUPROPION HCL) One tab by mouth qAM  #30 x 0   Entered and Authorized by:   Rodney Langton MD   Signed by:   Rodney Langton MD on  02/11/2010   Method used:   Electronically to        [facility] (retail)       [address]       [place], [place]  [address]       Ph: [phone]       Fax: [phone]   RxID:   [id number]

## 2010-08-31 NOTE — Miscellaneous (Signed)
Summary: PHQ-9 Score  Score: 10, moderately difficult.  Suspect worsening of depression, was 5 at last visit.  He has an appt with me on the 29th, will address then. Rodney Langton MD  March 16, 2010 12:24 PM

## 2010-08-31 NOTE — Letter (Signed)
Summary: Generic Letter  [facility]  [address]   [place], [place] [address]   Phone: [phone]  Fax: [phone]    12/08/2009  [name] [name] [address] [place], [place]  [address]  Dear [name],  Please call the office when you have a chance to talk about your cholesterol and blood sugar.  I know you are very busy with your wife, and I hope she is doing well.  I look forward to talking with you when you have a few minutes.         Sincerely,   Asher Muir MD  Appended Document: Generic Letter mailed.

## 2010-08-31 NOTE — Assessment & Plan Note (Signed)
Summary: F/U /KH   Vital Signs:  Patient profile:   65 year old male Height:      71 inches Weight:      298.8 pounds BMI:     41.82 Temp:     98.2 degrees F oral Pulse rate:   80 / minute BP sitting:   120 / 77  (left arm) Cuff size:   large  Vitals Entered By: Garen Grams LPN (February 24, 2010 9:01 AM) CC: f/u grief reaction Is Patient Diabetic? No Pain Assessment Patient in pain? no        Primary Care Provider:  . [facility]  CC:  f/u grief reaction.  History of Present Illness: 64M with hx depression on celexa 40 (one half tab)  Preivous Hx: Wife passed away 5 wks ago from breast Ca.  Pt feeling very sad.  Lonely.  No SI/HI.  PHQ-9 10 (moderate depression) Trouble sleeping.  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  Doesn't have a lot of support from the congregation.  One woman gave him a list of single women she thought he should date the day after his wife passed, this hurt him greatly.  He already has a Haematologist that he talks to, Dr. Dellia Cloud.  Tried Wellbutrin, he stopped this 2/2 abd discomfort.    Today: Feeling better little by little, cont to work and preach, keeping busy keeps his mind off his grief.  This works for him.  We looked at pics of his wife and talked about their life.  He has been seeing his therapist and this is helping a lot.  He still has some anhedonia.    I spent 25 mins with this pt counselling.  Habits & Providers  Alcohol-Tobacco-Diet     Tobacco Status: never  Current Medications (verified): 1)  Pantoprazole Sodium 40 Mg  Tbec (Pantoprazole Sodium) .[name].. 1 By Mouth Once Daily 2)  Ibuprofen 600 Mg  Tabs (Ibuprofen) .[name].. 1 By Mouth Three Times A Day As Needed Pain 3)  Zetia 10 Mg  Tabs (Ezetimibe) .[name].. 1 Tab By Mouth At Bedtime. 4)  Restoril 15 Mg Caps (Temazepam) .... Take 1 By Mouth At Bedtime As Needed Sleep 5)  Baby Aspirin 81 Mg Chew (Aspirin) .... Take 1 By Mouth  Daily 6)  Lisinopril-Hydrochlorothiazide 20-12.5 Mg Tabs (Lisinopril-Hydrochlorothiazide) .[name].. 1 Tab By Mouth Daily For Blood Pressure 7)  Citalopram Hydrobromide 40 Mg Tabs (Citalopram Hydrobromide) .[name].. 1 Tab By Mouth Daily 8)  Wellbutrin Xl 300 Mg Xr24h-Tab (Bupropion Hcl) .... One Tab By Mouth Qam  Allergies (verified): No Known Drug Allergies  Review of Systems       See HPI  Physical Exam  General:  Well-developed,well-nourished,in no acute distress; alert,appropriate and cooperative throughout examination Psych:  Oriented X3, memory intact for recent and remote, normally interactive, good eye contact, not agitated, not suicidal, not homicidal  He did have mildly depressed affect, not tearful today, and not anxious.   Additional Exam:  PHQ-9: 5-6, was 10 at last visit.   Impression & Recommendations:  Problem # 1:  GRIEF REACTION, ACUTE (ICD-309.0) Assessment Improved Subjectively and objectively improving, I am ok with him discontinuing Wellbutrin as he is doing so well.  I will see him again in a month.  Orders: [facility]- Est  Level 4 ([address])  Complete Medication List: 1)  Pantoprazole Sodium 40 Mg Tbec (Pantoprazole sodium) .[name].. 1 by mouth once  daily 2)  Ibuprofen 600 Mg Tabs (Ibuprofen) .[name].. 1 by mouth three times a day as needed pain 3)  Zetia 10 Mg Tabs (Ezetimibe) .[name].. 1 tab by mouth at bedtime. 4)  Restoril 15 Mg Caps (Temazepam) .... Take 1 by mouth at bedtime as needed sleep 5)  Baby Aspirin 81 Mg Chew (Aspirin) .... Take 1 by mouth daily 6)  Lisinopril-hydrochlorothiazide 20-12.5 Mg Tabs (Lisinopril-hydrochlorothiazide) .[name].. 1 tab by mouth daily for blood pressure 7)  Citalopram Hydrobromide 40 Mg Tabs (Citalopram hydrobromide) .[name].. 1 tab by mouth daily

## 2010-09-08 NOTE — Progress Notes (Signed)
Summary: Phn Msg  Phone Note Refill Request Call back at Home Phone [phone]   pt needs to speak with RN re: an antidepressant he was on in the past, needs to know the name of the 1st one he was given  Initial call taken by: Knox Royalty,  August 30, 2010 11:40 AM  Follow-up for Phone Call        informed pt of medication Follow-up by: Loralee Pacas CMA,  August 30, 2010 5:18 PM

## 2010-09-22 ENCOUNTER — Ambulatory Visit (INDEPENDENT_AMBULATORY_CARE_PROVIDER_SITE_OTHER): Payer: 59 | Admitting: Psychology

## 2010-09-22 DIAGNOSIS — F331 Major depressive disorder, recurrent, moderate: Secondary | ICD-10-CM

## 2010-09-27 ENCOUNTER — Encounter: Payer: Self-pay | Admitting: Family Medicine

## 2010-09-27 ENCOUNTER — Ambulatory Visit (INDEPENDENT_AMBULATORY_CARE_PROVIDER_SITE_OTHER): Payer: 59 | Admitting: Family Medicine

## 2010-09-27 DIAGNOSIS — E7800 Pure hypercholesterolemia, unspecified: Secondary | ICD-10-CM

## 2010-09-27 DIAGNOSIS — F4321 Adjustment disorder with depressed mood: Secondary | ICD-10-CM

## 2010-09-27 DIAGNOSIS — K219 Gastro-esophageal reflux disease without esophagitis: Secondary | ICD-10-CM

## 2010-09-27 DIAGNOSIS — E669 Obesity, unspecified: Secondary | ICD-10-CM

## 2010-09-27 DIAGNOSIS — I10 Essential (primary) hypertension: Secondary | ICD-10-CM

## 2010-09-27 MED ORDER — EZETIMIBE 10 MG PO TABS: 10.0000 mg | ORAL_TABLET | Freq: Every day | ORAL | Status: DC

## 2010-09-27 NOTE — Progress Notes (Signed)
  Subjective:    Patient ID: [name] [name], male    DOB: 11-07-1945, 65 y.o.   MRN: [id number]  Hypertension This is a chronic problem. The current episode started more than 1 year ago. The problem is unchanged. The problem is controlled. Pertinent negatives include no blurred vision, headaches, palpitations, peripheral edema, shortness of breath or sweats. There are no associated agents to hypertension. Risk factors for coronary artery disease include male gender, obesity and stress. Past treatments include ACE inhibitors, diuretics and lifestyle changes. The current treatment provides moderate improvement. There are no compliance problems.  There is no history of angina, kidney disease, CAD/MI, CVA, heart failure, left ventricular hypertrophy, PVD or retinopathy.   2. Grief reaction. Being treated with SSRI because of grief reaction. Wife died of metastatic breast cancer 7 monthes ago. He is gradually improving. No suicidal thoughts.  3. HLD - zetia   Review of Systems  Eyes: Negative for blurred vision.  Respiratory: Negative for shortness of breath.   Cardiovascular: Negative for palpitations.  Gastrointestinal: Negative for abdominal distention.  Genitourinary: Negative for urgency, frequency, decreased urine volume and difficulty urinating.  Musculoskeletal: Positive for arthralgias.  Neurological: Negative for headaches.       Objective:   Physical Exam  Constitutional: He appears well-developed.       Obese  HENT:  Head: Normocephalic and atraumatic.  Eyes: Pupils are equal, round, and reactive to light.  Neck: Normal range of motion. Neck supple. No JVD present. No tracheal deviation present. No thyromegaly present.  Cardiovascular: Normal rate, regular rhythm and normal heart sounds.   No murmur heard. Pulmonary/Chest: Effort normal and breath sounds normal.  Abdominal: Soft. Bowel sounds are normal. He exhibits no distension. There is no tenderness.  Neurological: He is  alert.  Psychiatric: He has a normal mood and affect. His behavior is normal.          Assessment & Plan:  Pt. ios a 65 y/o c/m here for his annual history and physical 1. Obesity is his main issue. I counseled him about diet and exercise.

## 2010-10-20 ENCOUNTER — Ambulatory Visit: Payer: 59 | Admitting: Psychology

## 2010-11-05 LAB — BASIC METABOLIC PANEL
CO2: 27 mEq/L (ref 19–32)
Calcium: 9.1 mg/dL (ref 8.4–10.5)
GFR calc Af Amer: >60 mL/min (ref >60)
GFR calc non Af Amer: >60 mL/min (ref >60)
Sodium: 140 mEq/L (ref 135–145)

## 2010-11-17 ENCOUNTER — Ambulatory Visit (INDEPENDENT_AMBULATORY_CARE_PROVIDER_SITE_OTHER): Payer: 59 | Admitting: Psychology

## 2010-11-17 DIAGNOSIS — F331 Major depressive disorder, recurrent, moderate: Secondary | ICD-10-CM

## 2010-12-17 NOTE — H&P (Signed)
[place]. [facility]  Patient:    [name] [name], [name] [name]                     MRN: [id number] Adm. Date:  12/13/00 Attending:  Mena Pauls, M.D. Dictator:   Zella Ball, M.D. CC:         Maryelizabeth Rowan, M.D. in the [facility] [address] [address]                         History and Physical  DATE OF BIRTH:  1946/01/08  ADMITTING PRIMARY DIAGNOSIS:  Diverticulitis.  SECONDARY DIAGNOSES: 1. Hypertension. 2. Gastroesophageal reflux disease. 3. Hypercholesterolemia. 4. History of depression.  CHIEF COMPLAINT:  Abdominal pain, rectal bleeding.  PRESENTING HISTORY:  This is a 65 year old white male with a history of abdominal pain x 2-3 weeks.  Patient states the pain has been intermittent, worse with food, so much so that patient has been avoiding eating lately.  He states he will get his pain episodically.  It will start in his low back and kind of goes around on both sides to localize in his lower abdominal region midline.  Patient denies any nausea or vomiting with this pain; however, he has had diarrhea for the past two weeks, also intermittent, but when he is having it he will have three to four loose stools per day.  Patient states that he had not had a fever with this and denies any shaking chills and, as stated before, no nausea or vomiting.  Today, patient noticed that there was some blood in a diarrheal stool that he had and this concerned him; therefore, he called the [facility] who in turn told the patient to come to the emergency department.  PRIOR GASTROINTESTINAL HISTORY:  Patient states that he had a colonoscopy "a few years ago."  At the time, he states that his physician was concerned about the possibility of a cancer, but to his recollection the colonoscopy was completely normal.  REVIEW OF SYSTEMS:  As in HPI.  Patient also states he has had some lightheadedness with rising over the past couple of days; however,  no loss of consciousness.  MEDICATIONS: 1. Diovan 80 mg p.o. q.d. 2. Enteric-coated aspirin 325 mg q.d. 3. Sporanox 200 mg b.i.d. the first week of every month. 4. Vitamin C, E, and a multivitamin.  PAST MEDICAL HISTORY:  As stated previously.  SOCIAL HISTORY:  Patient denies smoking or alcohol use.  He works two jobs. 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  FAMILY HISTORY:  His father had diverticulitis or diverticulosis and emphysema.  His mother had MIs x 2.  PHYSICAL EXAMINATION:  VITAL SIGNS:  Patient was afebrile at 97.7, pulse 76, respirations 18, blood pressure 132/86, saturation 98% on room air.  GENERAL:  This is an alert, pleasant male in no acute distress.  HEENT:  Normocephalic, atraumatic.  CARDIOVASCULAR:  Heart sounds are regular rate and rhythm without murmur.  PULMONARY:  Clear to auscultation anteriorly.  ABDOMEN:  Diffusely tender.  Positive tympanic bowel sounds.  No guarding, no organomegaly appreciated.  Patient localized his area of most tenderness to his lower mid abdominal region.  EXTREMITIES:  No edema, no cyanosis.  RECTAL:  Heme positive stools.  Rectum felt to be distended with gas.  LABORATORY DATA:  White count 7.8 with 74%  polymorph nuclear cells, 15% lymphocytes.  Hemoglobin 15.5, platelets 182.  Sodium 136, potassium 3.9, chloride 106, bicarb 26, BUN 23, creatinine 1.3, glucose 89, calcium 8.8, total protein 6.5, albumin 3.6, AST 16, ALT 15, alk phos 51, total bili 1. UA shows specific gravity 1.035, trace hemoglobin, small amount of bilirubin, 15 ketones, negative leukocyte esterase and nitrites.  CT of abdomen and pelvis showed some stranding around sigmoid colon consistent with diverticulitis, no free air in the abdomen, no evidence of abscess.  Also a 1.7 cm nodule in the right adrenal gland, likely adenoma, but recommend MRI as  follow-up.  ASSESSMENT AND PLAN:  This is a 65 year old white male with evidence of diverticulitis on CT, signs and symptoms consistent with this.  No evidence of bowel perforation or sepsis.  Believe that patient heme positive stools and bright red blood per rectum could be consistent with this inflammatory process.  1. Diverticulitis.    a. Admit to floor.    b. Patient is to be n.p.o. with ice chips.    c. Unasyn 3 g IV q.6h.    d. Phenergan and morphine IV p.r.n. for pain.  We will follow patient       symptomatically.  Likely patient will need a sigmoidoscopy or       colonoscopy; however, doubt that in the acute setting this is       necessary unless patient does not appear to be improving.  Will       switch to oral antibiotics once patient is tolerating orals. 2. Dehydration with patients somewhat high specific gravity of 1.035 and his    complaint of lightheadedness with standing seems to be consistent with    a mild dehydration secondary to his decreased p.o. intake over the past    several days. We will IV hydrate the patient overnight and follow him    symptomatically for this. 3. Hypertension.  Will hold the patients blood pressure medications for now    and watch his blood pressure and we will start his oral medications when    he can tolerate p.o. intake. 4. Question of right adrenal mass.  This is an incidental finding on the CT of    abdomen and pelvis at this point; however, will discuss with team    whether we should go ahead and get an MRI for this. 5. History of GERD.  Will start patient on Protonix 40 mg IV while in-house. DD:  12/13/00 TD:  12/13/00 Job: [id number]

## 2010-12-17 NOTE — H&P (Signed)
[place]. [facility]  Patient:    [name] [name], [name] [name]                     MRN: [id number] Adm. Date:  02/19/00 Attending:  Gaynell Face L. Deirdre Priest, M.D. Dictator:   Raynelle Jan                         History and Physical  DATE OF BIRTH:  31-Jan-1946  PROBLEM LIST: 1. Chest pain. 2. Hypertension. 3. History of coronary artery disease with a normal echo approximately six    months ago. 4. Hypocholesterolemia. 5. GERD. 6. Status post right arthroscopic knee and ankle surgery approximately one and    one-half weeks ago. 7. Multiple surgeries of the right lower extremity secondary to compartment    syndrome.  HISTORY OF PRESENT ILLNESS:  This is a 65 year old male with history of possible coronary artery disease and hypercholesterolemia who presents to the ED with a one day history of epigastric and substernal chest pain with radiation to the right arm.  Pain began this morning around breakfast time and was associated with nausea, diaphoresis, and shortness of breath.  Once in the ED he received aspirin and sublingual nitroglycerin with moderate pain relief. He rated the pain as an 8/10 and this morning on evaluation in the ED rates his pain currently at 1/10.  Of note, he is status post arthroscopic surgery of the right knee and ankle approximately one and one-half weeks ago with resulting immobilization.  He has also just returned from a beach trip during which on Thursday he rode approximately four hours nonstop in a car from [place].  He denies any recent calf pain, redness, or swelling of that right lower extremity.  PAST MEDICAL HISTORY: 1. Hypertension. 2. GERD. 3. Multiple surgeries of the right leg secondary to compartment syndrome from    a fall. 4. Hypercholesterolemia. 5. Status post appendectomy approximately two years ago. 6. Questionable coronary artery disease in that he was told several years ago    by his primary M.D. that he  had experienced an MI in the past based on an    EKG.  MEDICATIONS: 1. Celebrex 200 mg p.o. q.d. 2. Enteric coated aspirin 325 mg p.o. q.d. 3. Diovan 80 mg p.o. q.d. 4. Percocet p.r.n. surgical pain. 5. Prevacid 30 mg p.o. q.d. 6. Multivitamin p.o. q.d. 7. Vitamin E q.d. 8. Vitamin C q.d.  ALLERGIES:  He states he is allergic to LODINE as it caused a GI bleed.  FAMILY HISTORY:  His mother died of coronary artery disease when she was elderly.  Father had COPD.  He has three brothers and sisters with rheumatic heart disease.  SOCIAL HISTORY:  He is a nonsmoker, nondrinker.  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  He also works part time as a Engineer, mining.  Is married.  Has three children.  Lives in [place].  REVIEW OF SYSTEMS:  Negative for fever, chills, weight loss, myalgias.  He has had no upper respiratory complaints, cough.  He did note shortness of breath this morning in association with this chest pain.  He has noticed symptoms of heartburn and reflux; however, he denies any other abdominal pain, vomiting, diarrhea, melena.  He has had some constipation in relation to the narcotic use.  He denies dysuria, hematuria.  He has had right lower extremity leg pain with bruising  and immobilization of that extremity.  He notes no skin rashes or neurological defect.  PHYSICAL EXAMINATION:  VITAL SIGNS:  Afebrile at 96.0.  Pulse 76.  Blood pressure 135/78. Respirations 26.  He was saturating 97% in room air.  GENERAL:  He was alert and oriented, in no distress in the ED.  HEENT:  Pupils are equal, round and reactive to light.  Extraocular movements are intact.  Oropharynx was without erythema or exudates.  NECK:  Supple without lymphadenopathy or JVD.  CARDIOVASCULAR:  Normal S1, S2 with a regular rate and rhythm.  No murmurs, rubs, or gallops.  Chest wall was nontender to palpation.  LUNGS:  Clear to auscultation bilaterally  with good air movement throughout.  ABDOMEN:  Soft, nontender, nondistended with positive bowel sounds.  RECTAL:  Heme negative with no masses.  EXTREMITIES:  Right lower extremity had multiple surgical scars and an acute surgical wound on the knee and ankle with Steri-Strips intact.  He was tender to palpation over the extremity diffusely with ecchymosis on the calf and posterior thigh region.  The posterior portion of the knee was mildly warm to touch.  Pedal pulses were 2+ bilaterally.  NEUROLOGIC:  Cranial nerves were intact.  He had no focal neurological deficits.  LABORATORIES:  White count 5.9, H&H 15.1 and 43.0, platelets 266. Comprehensive metabolic panel:  Sodium 132, potassium 3.7, chloride 103, bicarbonate 24, BUN 24, creatinine 1.3, glucose 105, calcium 9.1, total protein 6.6, albumin 3.9, SGOT 27, SGPT 33, alkaline phosphatase 56, total bilirubin 1.0.  Coags were within normal limits.  Cardiac enzymes:  CK 23, MB fraction 0.7, index 3.1, troponin 0.4.  I-STAT VBG showed a pH of 7.438, PCO2 31.4, bicarbonate 21.  Chest x-ray showed no active disease.  EKG showed a normal sinus rhythm with nonspecific ST and T-wave changes.  ASSESSMENT AND PLAN:  This is a 65 year old male with cardiac risk factors including hypertension, hypercholesterolemia, age, and a possible history of coronary artery disease who presents with acute onset chest pain.  The differential of his chest pain includes acute myocardial infarction/unstable angina given his cardiac risk factors, pulmonary embolism given the history of immobilization, or possible pain secondary to a gastrointestinal cause.  We will admit and rule out for myocardial infarction with serial enzymes and cardiac monitoring; however, we will also obtain a spiral CT to rule out PE.  # 1- CARDIAC:  As he is already on a heparin and nitroglycerin drip, we will continue these while awaiting his cardiac enzymes.  We will continue him  on  aspirin and start a low dose beta blocker.  Will check an EKG in the morning and a fasting lipid panel.  We will need to watch his hemoglobin as he is on heparin and has a history of a gastrointestinal bleed in the past.  # 2 - DYSPNEA WITH A HISTORY OF IMMOBILIZATION:  We will continue him on heparin until the results of his spiral CT are obtained.  # 3 - GASTROESOPHAGEAL REFLUX DISEASE:  We will continue him on a protime pump inhibitor.  # 4 - HYPERCHOLESTEROLEMIA:  We will obtain a fasting lipid panel and consider starting him on Statin therapy. DD:  02/19/00 TD:  02/19/00 Job: [id number]

## 2010-12-17 NOTE — Op Note (Signed)
NAME:  [name] [name], [name] [name]                      ACCOUNT NO.:  [id number]   MEDICAL RECORD NO.:  [id number]                   PATIENT TYPE:  AMB   LOCATION:  ENDO                                 FACILITY:  [facility]   PHYSICIAN:  Graylin Shiver, M.D.                DATE OF BIRTH:  15-May-1946   DATE OF PROCEDURE:  03/10/2004  DATE OF DISCHARGE:                                 OPERATIVE REPORT   PROCEDURE:  Colonoscopy with biopsy.   INDICATION FOR PROCEDURE:  Heme-positive stool.  Family history of colon  polyps.   Informed consent was obtained after explanation of the risks of bleeding,  infection, and perforation.   PREMEDICATIONS:  1. Fentanyl 87.5 mcg IV.  2. Versed 7 mg IV.   DESCRIPTION OF PROCEDURE:  With the patient in the left lateral decubitus  position, a rectal exam was performed; no masses were felt.  The Olympus  colonoscope was inserted into the rectum and advanced around the colon to  the cecum.  Cecal landmarks were identified.  The ileocecal valve was  intubated, and the first few centimeters of terminal ileum were inspected  and looked normal.  The cecum looked normal.  The ascending colon looked  normal.  The transverse colon looked normal.  The descending colon looked  normal.  The sigmoid showed a few diverticula.  The rectum showed a small 3  mm polyp biopsied off with cold forceps.  He tolerated the procedure well  without complications.   IMPRESSION:  1. Few diverticula in the sigmoid.  2. Small rectal polyp.  Diagnosis code is 211.4.   PLAN:  The biopsy will be checked.                                               Graylin Shiver, M.D.    [name] [name]  D:  03/10/2004  T:  03/10/2004  Job:  [id number]   cc:   [name] Beath, MD  Fax: [phone]

## 2010-12-17 NOTE — Discharge Summary (Signed)
[place]. [facility]  Patient:    [name] [name], [name] [name]                   MRN: [id number] Adm. Date:  [id number] Disc. Date: [id number] Attending:  McDiarmid, Leighton Roach. Dictator:   Ebbie Ridge, M.D. CC:         Maryelizabeth Rowan, M.D., [facility]   Discharge Summary  DISCHARGE DIAGNOSES: 1. Diverticulitis. 2. Hypertension. 3. Gastroesophageal reflux disease. 4. Hypercholesterolemia. 5. History of depression.  DISCHARGE MEDICATIONS: 1. Cipro 500 mg p.o. b.i.d. x 10 days. 2. Flagyl 500 mg p.o. t.i.d. x 10 days. 3. Diovan 80 mg p.o. q.d. 4. Aspirin 325 mg p.o. q.d. 5. Multivitamin, vitamin C, vitamin E as previously used. 6. Sporanox as previously used. 7. Darvocet-N 100 one pill p.o. q.6h. p.r.n. pain.  LABORATORY AND X-RAY DATA:  Laboratory data at discharge:  Sodium 139, potassium 3.7, chloride 107, bicarb 26, glucose 94, BUN 10, creatinine 1.3, total bilirubin 0.9, alk phos 41, AST 10, ALT 12, total protein 5.8, albumin 3.1 (low), calcium 8.5.  Lipid profile:  Total cholesterol 209, triglycerides 108, HDL 32, LDL 155, total cholesterol/HDL ratio 6.5.  Prostate-specific antigen is normal at 1.11.  HISTORY OF PRESENT ILLNESS:  Briefly, [name] [name] is a 65 year old white male who presented to the emergency room with a two- to three-week history of intermittent abdominal pain that was worse after eating.  He had had decreased appetite and diarrhea with three to four loose stools per day.  He denied nausea or vomiting.  The pain would initiate the patients back and then go around to the front side, localizing to the midlower abdomen.  On the day of admission, the patient noticed bright red blood in his diarrhea, and after calling the family practice center, he was told to come to the emergency department.  REVIEW OF SYSTEMS:  Significant for lightheadedness with rising over the two days prior to admission.  HOME  MEDICATIONS: 1. Diovan 80 mg p.o. q.d. 2. Aspirin 325 mg p.o. q.d. 3. Sporanox 200 mg b.i.d. 4. Multivitamin and vitamin C and E.  PAST MEDICAL HISTORY: 1. Hypertension. 2. GERD. 3. Hypercholesterolemia. 4. History of depression. 5. Cardiac catheterization in 2001, completely clean. 6. Last colonoscopy "a few years ago" which was normal.  ALLERGIES:  No known drug allergies.  SOCIAL HISTORY:  The patient denies tobacco or alcohol.  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  FAMILY HISTORY:  The patients father had diverticulosis and emphysema. Mother has had two MIs.  PHYSICAL EXAMINATION ON ADMISSION:  GENERAL APPEARANCE:  He was alert and pleasant in no acute distress.  VITAL SIGNS:  The patient was afebrile with a temperature 97.7, pulse 76, respiratory rate 18, blood pressure 132/86, pulse oximetry 98% on room air.  CARDIOVASCULAR:  Heart rate was regular without murmur.  RESPIRATORY:  Lungs were clear to auscultation.  NEUROLOGIC:  There were no focal neurological deficits.  ABDOMEN:  Diffusely tender with tympanic bowel sounds, no guarding.  RECTAL:  Exam revealed heme-positive stool and gaseous distension of the rectum.  LABORATORY DATA ON ADMISSION:  White count 7.8 with 74% neutrophils, 15% lymphocytes.  Hemoglobin was 15.5 and platelets 182.  Electrolytes were all within normal limits.  Urinalysis revealed trace hemoglobin and 15 ketones with a specific gravity of 1.035, but was negative for leukocyte esterase or nitrites.  There was few bacteria, 0-3 white blood cells,  and 4-6 red blood cells.  PROCEDURES:  CT of abdomen and pelvis on May 14 revealed a 1.7 cm nodule i the right adrenal gland, likely an adenoma.  Recommend further evaluation with MRI or follow-up with a noncontrast thin-section CT through the adrenal gland. Diverticula with stranding around the sigmoid colon compatible with diverticulitis was also  seen.  HOSPITAL COURSE: #1 - DIVERTICULITIS:  The patient was admitted and made n.p.o.  Unasyn 3 g IV q.6h. was started for diverticulitis.  He received Phenergan for nausea and morphine for pain.  By the following day, his pain had improved, but he was hesitant to attempt oral intake.  By that evening, he was tolerating clear liquids.  On the day of discharge, the patients pain had improved, and he was tolerating clear liquids with diarrhea only.  He is to continue Cipro and Flagyl as an outpatient.  Because he had his first episode of diverticulitis at such an early age, follow-up with a gastroenterologist or surgeon may be recommended.  There is some evidence of that patients early diverticulitis are at high risk for second and third recurrences, and may benefit from surgical resection of the involved areas.  This can be discussed at follow-up.  #2 - HYPERTENSION:  Blood pressure was stable throughout the admission on the patients previously prescribed Diovan.  #3 - ADRENAL NODULE:  This is likely an incidental finding.  The radiologist did recommend further evaluation with MRI or follow-up with a noncontrast thin-section CT through the adrenal gland.  This can be discussed with the patient at follow-up.  It was not felt that further workup in the hospital was necessary.  #4 - HEALTH MAINTENANCE:  The patient takes daily aspirin and multivitamins. His primary care physician, Dr. Duanne Guess, visited him daily during his hospitalization.  PSA and lipid profile were checked.  The patient will follow up at a previously scheduled complete physical exam appointment with Dr. Duanne Guess next week at the family practice center.  DISCHARGE FOLLOWUP:  Follow-up appointment scheduled with Dr. Duanne Guess at the family practice center next week. DD:  12/14/00 TD:  12/16/00 Job: [id number]

## 2010-12-17 NOTE — H&P (Signed)
NAME:  [name] [name], [name] [name]                      ACCOUNT NO.:  [id number]   MEDICAL RECORD NO.:  [id number]                   PATIENT TYPE:  INP   LOCATION:  4729                                 FACILITY:  [facility]   PHYSICIAN:  Sibyl Parr. Fields, M.D.                DATE OF BIRTH:  [birth year]   DATE OF ADMISSION:  12/16/2002  DATE OF DISCHARGE:                                HISTORY & PHYSICAL   PRIMARY CARE PHYSICIAN:  Dr. Maryelizabeth Rowan at [facility].   CHIEF COMPLAINT:  Chest pain.   HISTORY OF PRESENT ILLNESS:  The patient is a 65 year old Caucasian male  with past medical history significant for hypertension,  hypercholesterolemia, GERD, and depression, presenting secondary to chest  pain.  Onset of severe chest pain during sexual intercourse on day prior to  presentation.  Left anterior chest pain that felt like a cannon shot in his  chest.  Positive pressure, positive shortness of breath, positive for  diaphoresis; no nausea, no vomiting; positive for dizziness relating to  episode.  Chest pain was relieved 45 minutes later with rest.  Patient also  experienced bilateral arm pain during episode.  The patient reported he had  very mild chest pain approximately 2 days prior to presentation that lasted  approximately 5 to 10 minutes and resolved.  Patient reports that he had  varying soreness in his chest and arms today and presented to the emergency  department after pain became progressively worse and rating it as 3/10 as  compared to the night before pain of 10/10.  The patient does admit to  severe stress at home as well as at work.  The patient is also status post  left arthroscopic knee surgery one week previously.  The patient does have  significant left calf tenderness that started approximately five days prior  to presentation.   PAST MEDICAL HISTORY:  1. Hypercholesterolemia, diet-controlled.  2. Obesity with a recent 60-pound weight loss over  the past 6 months,     intentional.  3. Tension headaches.  4. Depression.  5. Hypertension, currently not on any medications due to recent weight loss.  6. GERD.  7. Diverticulosis.  8. Insomnia.   PAST SURGICAL PROCEDURES:  1. Multiple right lower extremity surgeries secondary to compartment     syndrome.  2. Left rotator cuff surgery in 1999.  3. Cardiac catheterization in 2001 which was within normal limits by Dr.     Nicki Guadalajara.  4. Status post echocardiogram which was within normal limits in January     2004.   ALLERGIES:  LODINE which causes GI bleeding.   MEDICATIONS:  1. Aspirin 325 mg p.o. daily.  2. Multivitamin orally daily.  3. Saw palmetto orally daily.  4. Vitamin C orally daily.  5. Vitamin E orally daily.  6. Calcium orally daily.   SOCIAL HISTORY:  The patient is married for the  past seven years.  The  patient has 3 children from a previous marriage who are 64 years old, 64  years old, and 64 years old, and all live in the area.  The patient has two  jobs; he is a Research officer, political party in the evening and he also works at a  [employer] data [employer] in [place].  No tobacco, alcohol or drugs.  Positive  significant stress at work and patient has significant stress at home  secondary to his wife having severe depression and anxiety with multiple  psychiatric admissions.   FAMILY HISTORY:  Mother is alive at the age of 49 years old and has had  multiple MIs in her 82s.  Father is 22 years old and has end-stage COPD and  has suffered a stroke in the past.  The patient has two brothers who are  healthy and two sisters who are healthy and brothers and sisters have not  had MIs.   REVIEW OF SYSTEMS:  CONSTITUTIONAL:  Positive dizziness yesterday with chest  pain, the patient has undergone a 60-pound intentional weight loss on [employer]  Weight [employer].  CARDIOVASCULAR:  Positive chest pain, left anterior chest, as  described above.  RESPIRATORY:  Positive shortness of  breath only with chest  pain.  The patient has not been short of breath or dyspneic in between chest  pain episodes.  No cough.  The patient did experience pleuritic chest pain  today with deep inspiration.  GI:  No nausea, vomiting or diarrhea.  No  bloody stools.  No melena.  SKIN:  No rashes.  NEUROLOGIC:  No weakness  other than chronic lower extremity weakness.  PSYCHIATRIC:  Increased stress  recently with marriage and work.  MUSCULOSKELETAL:  Positive left calf pain  for five days.  EYES:  No blurry vision.  ENT:  No runny nose.  No nasal  congestion.  GU:  No dysuria and no hematuria.   PHYSICAL EXAMINATION:  VITALS:  Temperature is 97.0, pulse is 70,  respirations are 22, blood pressure is 130/73 and O2 saturations 71% on room  air.  GENERAL:  Well-developed, well-nourished male, slightly diaphoretic and  slightly anxious on presentation.  Judgment and insight are intact.  The  patient is alert and oriented x3.  HEENT:  Pupils are equal, round and reactive to light.  Conjunctivae are  within normal limits.  Sclerae are nonicteric.  Oropharynx without erythema  and the upper plate is well-visualized.  NECK:  Neck is supple without lymphadenopathy or thyromegaly.  LUNGS:  Lungs are clear to auscultation bilaterally.  No wheezing, rales or  rhonchi.  No tachypnea.  No accessory muscle use.  No evidence of  respiratory distress.  CARDIOVASCULAR:  Regular rate and rhythm.  No murmurs, gallops or rubs.  Normal PMI is appreciated and there is no tenderness to palpation over  anterior chest wall.  GI:  Soft, nontender and nondistended.  Positive bowel sounds, normoactive.  No hepatosplenomegaly.  EXTREMITIES:  Right lower extremity is significantly larger than the left  lower extremity and this is chronic secondary to several surgeries on the  right lower extremity.  No pitting edema appreciated.  SKIN:  There are no rashes appreciated.  Skin is warm and dry.  The patient does have a  vertical skin flap located in the medial anterior calf region  that is well-healed without erythema or drainage.  LYMPHATIC:  There is no cervical or inguinal lymphadenopathy.  NEUROLOGICAL:  Cranial nerves II-XII are intact.  Motor  is 5/5 throughout.   LABORATORY DATA:  Hemoglobin is 16.4, hematocrit 48.8, white blood cells  50.0, platelets 203,000.  PT 13.3, INR 1.0, PTT 33.  D-dimer is 0.29.  CK of  54, CK-MB of 1.7, troponin I of 0.03.  Sodium 139, potassium 3.4, chloride  103, bicarb 24, BUN 22, glucose 86.   Chest x-ray revealed normal lung fields and no active disease.   EKG revealed normal sinus rhythm with rate of 66.  There is T wave inversion  of V1 through V3, however, nothing acute.   CT of the chest is without evidence of pulmonary embolism or DVT.   ASSESSMENT AND PLAN:  [age]-year-old male with cardiac risk factors  including hypertension, hyperlipidemia, obesity, presenting with chest pain.   1. Chest pain:  Differential diagnosis includes acute myocardial infarction,     pulmonary embolism, dissecting aorta, anxiety, gastroesophageal reflux     disease, musculoskeletal strain.  We will admit to telemetry and cycle     enzymes as well as follow serial electrocardiograms.  We will also     initiate beta blocker, aspirin, morphine, oxygen supplementation.  The     likelihood that this is cardiac in origin is unlikely secondary to normal     cardiac enzymes approximately 24 hours after event.  CT of chest is     without evidence of pulmonary embolism and normal D-dimer makes pulmonary     embolism less likely.  We will check blood pressures in both arms to rule     out aortic dissection, however, no evidence of aortic aneurysm on CT or     chest x-ray.  We will also initiate a proton pump inhibitor for possible     gastroesophageal reflux disease symptoms.  There is no evidence of     musculoskeletal strain on physical examination, so this is less likely.  2.  Hypoxia:  Differential diagnosis includes pulmonary embolism and     pneumonia.  There is no evidence of pulmonary embolism on CT of chest and     there is no evidence of pneumonia on chest x-ray or CT.  We will check an     arterial blood gas and follow O2 saturations closely.  We will wean O2     supplementation as indicated.  3. Left knee arthroscopy with left calf pain:  The patient is at increased     risk of deep venous thrombosis.  Despite normal D-dimer, we will check a     lower extremity Doppler in the morning and initiate heparin therapy.  4. Hypercholesterolemia, diet-controlled:  We will check a fasting lipid     panel in the morning.  5. Depression and anxiety:  The patient has had significant stress at home     and this may be contributing to presentation.  6. Obesity:  The patient has had a 60-pound weight loss and I have     congratulated him on this success.     Nilda Simmer, M.D.                        Sibyl Parr. Darrick Penna, M.D.   KS/MEDQ  D:  12/16/2002  T:  12/17/2002  Job:  [id number]

## 2010-12-17 NOTE — Consult Note (Signed)
NAME:  [name] [name], [name] [name]                      ACCOUNT NO.:  [id number]   MEDICAL RECORD NO.:  [id number]                   PATIENT TYPE:  INP   LOCATION:  4729                                 FACILITY:  [facility]   PHYSICIAN:  Rollene Rotunda, M.D.                DATE OF BIRTH:  [birth year]   DATE OF CONSULTATION:  12/17/2002  DATE OF DISCHARGE:                                   CONSULTATION   REFERRING PHYSICIAN:  Dr. Royal Hawthorn B. Fields.   REASON FOR CONSULTATION:  Evaluate patient with chest pain.   HISTORY OF PRESENT ILLNESS:  The patient is a 65 year old gentleman with a  past cardiac history including a workup for chest pain with a  catheterization in 2001.  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  He has had a  stress echocardiogram in December 2003 per Dr. Macarthur Critchley. Torelli.  In 2001,  he reported chest discomfort.  In 2003, he does not recall having these  symptoms.   He says that three days prior to admission, he developed some mild short-  lived chest discomfort.  He had a recurrence of this early in the a.m. of  admission.  He had a severe episode of 10/10 chest discomfort the day of  admission.  This was like someone hit him in the chest.  It was  substernal.  It did not radiate to his neck or to his arms.  He had not had  discomfort like this before.  He did not describe associated nausea,  vomiting or diaphoresis.  He did not have palpitations, presyncope or  syncope.  Of note, he was having intercourse during each of the three  episodes of chest discomfort.   He has been admitted, where he has ruled out for myocardial infarction.  He  had a CT which was negative for any evidence of pulmonary emboli.  He has  had a Doppler of his lower extremity which is negative for any evidence of  clot.  He has, however, had recurrent chest discomfort.  He has been treated  with morphine.   PAST MEDICAL HISTORY:   Hypertension x3 years, hyperlipidemia, morbid  obesity, trauma to his right leg with multiple surgeries for compartment  syndrome, diverticulosis, gastroesophageal reflux disease, anxiety,  insomnia.   PAST SURGICAL HISTORY:  Multiple surgeries on the right lower extremity,  left rotator cuff surgery, left knee surgery, arthroscopic, last week (of  note, he was sedentary for 48 hours following this).   ALLERGIES:  LOPID caused nausea, LODINE.   MEDICATIONS:  Aspirin, multivitamin.   SOCIAL HISTORY:  The patient lives in [place] with his wife.  He works in  Airline pilot and as a Optician, dispensing and for a Patent attorney.  He has never smoked  cigarettes and does not drink alcohol.   FAMILY HISTORY:  Family history is noncontributory for early coronary  artery  disease.  His mother is alive with heart disease at 80.   REVIEW OF SYSTEMS:  Review of systems as stated in the HPI, positive for  chronic joint pains in his lower extremities, 60-pound intentional weight  loss using the [employer].  Otherwise, as stated in the HPI  and negative for other systems.   PHYSICAL EXAMINATION:  GENERAL:  The patient is in no distress.  VITAL SIGNS:  Blood pressure 106/72, heart rate 60 and regular.  HEENT:  Eyelids unremarkable; pupils equal, round and reactive to light;  fundi not visualized.  Oral mucosa unremarkable.  NECK:  No jugular venous distention, wave form within normal limits, carotid  upstroke brisk and symmetric, no bruits, no thyromegaly.  LYMPHATICS:  No cervical, axillary or inguinal adenopathy.  LUNGS:  Lungs clear to auscultation bilaterally.  BACK:  No costovertebral angle tenderness.  CHEST:  Unremarkable.  HEART:  PMI not displaced or sustained, S1 and S2 within normal limits, no  S3, no S4, no murmurs.  ABDOMEN:  Abdomen flat; positive bowel sounds, normal in frequency and  patch; no bruits, no rebound, no guarding, no midline pulsatile mass, no  organomegaly.  SKIN:   No rashes, no nodules.  EXTREMITIES:  Pulses 2+ throughout, multiple well-healed surgical scars on  the right lower extremity, arthroscopic surgery scars in the left knee, left  calf tenderness without cords, no cyanosis, no clubbing.  NEUROLOGIC:  Oriented to person, place and time; cranial nerves II-XII  grossly intact; motor grossly intact.   LABORATORY DATA:  WBC 4.6, hemoglobin 15.0, sodium 140, potassium 4.1, BUN  13, creatinine 1.2, CK-MB negative x3, troponin negative x3.   EKG:  Sinus rhythm, rate 66, axis within normal limits, intervals within  normal limits, nonspecific anterior T wave changes.   ASSESSMENT AND PLAN:  1. Chest discomfort:  The patient has chest discomfort that seems to have,     for the most part, been exertional, although he has had some resting     episodes minimally here in the hospital.  He has had a negative workup in     the past with catheterization and apparent stress echocardiogram (I do     not have the stress echocardiogram report).  I do think it is prudent to     screen him with a stress test.  He would be unable to exercise.     Therefore, he will have an adenosine Cardiolite.  He has already had a     workup for a pulmonary embolism, which I think has been adequately     excluded.  2. Hypertension:  Blood pressure is well-controlled and will be followed by     his primary care physician.  3. Risk reduction:  We will check a lipid profile.                                               Rollene Rotunda, M.D.    JH/MEDQ  D:  12/17/2002  T:  12/18/2002  Job:  [id number]   cc:   Royal Hawthorn B. Fields, M.D.  [address] [place]  [place] [address]  Fax: [phone]

## 2010-12-17 NOTE — Cardiovascular Report (Signed)
[place]  Patient:    [name] [name], [name] [name]                   MRN: [id number] Proc. Date: 02/22/00 Adm. Date:  [id number] Disc. Date: [id number] Attending:  Doneta Public CC:         Lennette Bihari, M.D.             Rock Nephew, M.D.             Estill Batten. Deirdre Priest, M.D.             Dr. Zenda Alpers at [facility]                        Cardiac Catheterization  INDICATIONS:  Mr. [name] [name] is a 65 year old white male who was admitted by the teaching service with substernal chest pressure associated with diaphoresis and arm radiation.  Cardiac risk factors are notable for hyperlipidemia, family history of coronary artery disease, as well as hypertension.  LABORATORY DATA:  Notable for mild glucose intolerance.  The patient ruled out for myocardial infarction.  Due to nitrate responsiveness of chest pain, he is referred for definitive cardiac catheterization.  HEMODYNAMIC DATA:  ______  aortic pressure is 120/75, left ventricular pressure 120/8.  ANGIOGRAPHIC DATA:  Left main coronary artery was angiographically normal and bifurcated into a left anterior descending artery and left circumflex.  The left anterior descending artery gave rise to two prominent diagonal vessels, several small septal perforating arteries.  The left anterior descending artery and its branches were angiographically normal.  The circumflex vessel was a dominant vessel that gave rise to two major marginal vessels, and ended in a posterior descending coronary artery posterolateral system.  The right coronary artery had an anomalous takeoff and seemed to arise from either just above the non or left coronary cusp.  It was very difficult despite multiple catheters to selectively engage the ostium of this vessel, however, there was adequate visualization with the AL2 catheter which essentially revealed normal nondominant right  coronary system.  Biplane cine-arteriography revealed normal left ventricular function without focal segmental wall motion abnormalities.  The aortic root was somewhat tortuous.  An FL5 was necessary for the left coronary catheter.  A central aortography was performed.  There was no aortic regurgitation.  This was also done to help localize the origin of the right coronary artery.  A distal aortography was also performed in light of the patients hypertensive history.  There was no evidence of renal artery stenosis.  There was no significant aortoiliac disease.  Following the catheterization, the right groin site was closed with the Perclose device.  IMPRESSION: 1. Normal left ventricular function. 2. 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 3. No evidence of renal artery stenosis. DD:  02/22/00 TD:  02/23/00 Job: [id number]

## 2010-12-17 NOTE — Discharge Summary (Signed)
NAME:  [name] [name], [name] [name]                      ACCOUNT NO.:  [id number]   MEDICAL RECORD NO.:  [id number]                   PATIENT TYPE:  INP   LOCATION:  4729                                 FACILITY:  [facility]   PHYSICIAN:  Billey Gosling, M.D.                 DATE OF BIRTH:  1946-05-03   DATE OF ADMISSION:  12/16/2002  DATE OF DISCHARGE:  12/18/2002                                 DISCHARGE SUMMARY   DISCHARGE DIAGNOSES:  1. Atypical chest pain.  2. Hypoxia.  3. Status post left knee arthroscopy.  4. Left knee hemarthrosis.  5. Hypercholesterolemia, diet controlled.  6. Diabetes mellitus, type 2, diet controlled.  7. Obesity.  8. Anxiety.   PROCEDURES:  1. Spiral CT.  2. Adenosine stress test.  3. Lower extremity Dopplers.   CONSULTS:  1. Orthopedic surgery, Loreta Ave, M.D.  2. Cardiology, Page.   DISCHARGE MEDICATIONS:  1. Lopressor 12.5 mg b.i.d.  2. Aspirin 325 mg daily.  3. Protonix 40 mg daily.  4. Ultram 50 mg q.6h. p.r.n. severe pain.  5. Nitroglycerin 0.4 mg sublingual every five minutes x 3 p.r.n. chest pain.  6. Valium 2 mg q.8h. p.r.n. anxiety.  7. Tylenol 650 mg q.4-6h. p.r.n. pain.  8. Lipitor 10 mg daily.   DISCHARGE DISPOSITION AND FOLLOWUP:  The patient is discharged home and will  call family practice to set up a followup appointment with Maryelizabeth Rowan,  M.D., in the next week.   ADMISSION HISTORY:  This 65 year old Caucasian male presented to the [facility] Emergency Department with chest pain, onset during sexual intercourse  on the day prior to admission.  He stated that the pain lasted 45 minutes  and went away with rest.  He had associated shortness of breath and  diaphoresis with it.  The pain came on again the day of admission and was  worse.  Therefore he came to the emergency department.  The patient was  admitted for rule out of myocardial infarction.   HOSPITAL COURSE:  #1 - ATYPICAL CHEST PAIN:  The patient had another  episode  of chest pain during admission at rest and an EKG was obtained, which showed  no abnormalities.  He was ruled out for myocardial infarction by three sets  of cardiac enzymes.  Pulmonary embolism was ruled out with a spiral CT given  his recent orthoscopic knee surgery.  Lower extremity Dopplers were also  negative for DVT.  Given the patient's worrisome history of chest pain on  exertion, cardiology was consulted and they performed a stress Cardiolite  which showed an ejection fraction of 58% and no ischemia.  The patient will  be discharged home on Protonix since the chest pain may be secondary to  gastritis or reflux.  The patient will also be discharged on Valium since he  endorsed feelings of increased stress lately and the chest pain may also be  attributed  to that.   #2 - ANXIETY:  The patient related that he had increased stress secondary to  marital discord.  He was discharged with a prescription for Valium 2 mg  p.r.n., #50 with no refills.  He will follow up with his primary physician.   #3 - LEFT KNEE HEMARTHROSIS:  The patient was started on heparin on the  night of admission while he was being ruled out for MI and developed  increased swelling and bruising around his left knee where he had recent  arthroscopic surgery.  Orthopedics was consulted and agreed with current  management of ice packs and pain medicine as needed.  The patient's knee  decreased in swelling, as well as ecchymosis improved after discontinuing  the heparin.   #4 - HYPOXIA:  The patient's oxygen saturation on admission was reported at  74% on room air.  An ABG was obtained, which was within normal limits and  the patient then was documented as having oxygen saturation of 96-98% on  room for the rest of his hospitalization.  The chest x-ray was within normal  limits with no apparent disease and this may have been transient or false  documentation.   #5 - HYPERCHOLESTEROLEMIA:  The patient had  recently lost weight on [employer]  Weight Loss and had been diet controlled.  A lipid profile done in the  hospital showed an LDL of 135 and his goal less than 130.  He will be  started on Lipitor 10 mg daily and will need a repeat lipid panel as an  outpatient.   LABORATORY DATA:  The BNP on admission was within normal limits.  The AST  and ALT were 18 and 23, respectively.  The white blood count was 4.6,  hemoglobin 15.0, hematocrit 43.4, and platelet count 161.  The lipid profile  showed cholesterol 186, triglycerides 50, HDL 41, and LDL 135.  The TSH was  2.10.                                               Billey Gosling, M.D.    AS/MEDQ  D:  12/19/2002  T:  12/20/2002  Job:  [id number]   cc:   Maryelizabeth Rowan, M.D.  [facility]  [place], [place] [address]  Fax: [phone]

## 2010-12-17 NOTE — Consult Note (Signed)
NAME:  [name] [name], [name] [name]                      ACCOUNT NO.:  [id number]   MEDICAL RECORD NO.:  [id number]                   PATIENT TYPE:  INP   LOCATION:  4729                                 FACILITY:  [facility]   PHYSICIAN:  Loreta Ave, M.D.              DATE OF BIRTH:  1946/04/13   DATE OF CONSULTATION:  12/18/2002  DATE OF DISCHARGE:                                   CONSULTATION   REPORT TITLE:  ORTHOPEDIC CONSULTATION.   REFERRING PHYSICIAN:  Royal Hawthorn B. Darrick Penna, M.D.   IDENTIFYING INFORMATION:  The patient is a 65 year old married white male  with chief complaint of painful left knee.   HISTORY OF PRESENT ILLNESS:  This patient had undergone arthroscopic  debridement of the left knee last week and was to be seen in our office on  Dec 17, 2002, for his postoperative visit.  Apparently he has a past medical  history of hypertension as well as chest pain.  He apparently had an onset  of chest pain and was brought to [facility] and was admitted on Dec 16, 2002.   At that time, he underwent heparinization and a major workup for DVT and  pulmonary embolus.  These all proved negative.  However, since his  heparinization, there has been marked swelling and pain in his left knee.  At that time, we were consulted to follow up and recheck the patient.  He  denies any neurovascular compromise at this time.  He does complain of pain  in his left calf.  This has been present for approximately 7-8 days.  Apparently after his heparinization, the knee became quite swollen and had a  fairly large effusion by his history.  Over the past 24 hours, this has  improved.  He continues to have pain in his left calf.   PAST MEDICAL HISTORY:  This is significant for depression, hypertension,  GERD, diverticulosis, headaches, obesity with recent weight loss,  hypercholesterolemia.   PAST SURGICAL HISTORY:  This includes a right lower extremity IM nailing  with compartment syndrome, left rotator cuff  surgery, cardiac  catheterization in 2001, which was normal and an echocardiogram in 2004,  which was normal.   ALLERGIES:  LODINE which gave him GI bleed.   MEDICATIONS:  Multivitamins, saw palmetto, and aspirin 325.   FAMILY HISTORY:  This is positive for heart disease in a mother, stroke in a  father .  Brother and sisters are unremarkable.   SOCIAL HISTORY:  This is a 65 year old married white male.  He denies use of  tobacco or alcohol.   REVIEW OF SYSTEMS:  This is well documented in the chart.   PHYSICAL EXAMINATION:  GENERAL:  This is a 65 year old white male well  developed, well nourished, alert, pleasant and cooperative.  He is in  moderate distress secondary to left leg pain.  VITAL SIGNS:  Temperature 97.0, pulse 70, respiratory rate 22, blood  pressure 130/73.  SKIN:  Intact with surgical incisions well approximated with nylon suture.  EXTREMITIES:  No erythema or ecchymosis at this time.  He has a trace to 1+  effusion.  Calf has some tenderness to palpation.  No tightness of the  compartments.  He has smooth motion of the knee with pain.  Good ligament  stability.  Neurovascularly intact distally.   LABORATORY DATA:  These are documented on the chart.  His hemoglobin on Dec 17, 2002, was 15 with hematocrit of 43.4%, white count was 4600.  His  chemistries are essentially unremarkable except for decreased protein and  albumin.  His cardiac enzymes are unremarkable.   There were no radiographic studies of the knee and are not necessary.  His  CT scan reveals no pulmonary embolus or DVTs.   IMPRESSION:  1. Status post left knee arthroscopy.  2. Chest pain, etiology unknown.  3. Hemarthrosis secondary to heparinization which is resolving.   RECOMMENDATIONS:  At this time, no intervention from orthopedics is  necessary other than the use of ice.  Once he is cleared from cardiology, we  will begin physical therapy.  He is understanding of this.   Thank you for  this consultation.     Oris Drone Petrarca, P.A.-C.                Loreta Ave, M.D.    BDP/MEDQ  D:  12/18/2002  T:  12/18/2002  Job:  [id number]   cc:   Sibyl Parr. Fields, M.D.  [address] [place]  [place] [address]  Fax: [phone]

## 2010-12-17 NOTE — Op Note (Signed)
[name] [name], [name] [name]            ACCOUNT NO.:  [id number]   MEDICAL RECORD NO.:  [id number]          PATIENT TYPE:  AMB   LOCATION:  [facility]                         FACILITY:  [facility]   PHYSICIAN:  Angelia Mould. Derrell Lolling, M.D.DATE OF BIRTH:  Jan 22, 1946   DATE OF PROCEDURE:  11/22/2006  DATE OF DISCHARGE:                               OPERATIVE REPORT   PREOPERATIVE DIAGNOSIS:  Painful umbilical hernia.   POSTOPERATIVE DIAGNOSIS:  Painful umbilical hernia.   OPERATION PERFORMED:  Umbilicus herniorrhaphy.   SURGEON:  Dr. Claud Kelp   OPERATIVE INDICATIONS:  This is a 65 year old white man, who has noticed  painful bulge at his umbilicus for about 2-3 months.  This has been  getting worse.  He has some reflux symptoms as well.  The patient pain  is very focal at the umbilicus.  On exam, he is 6 feet 0 inches tall  weighing 286 pounds.  He has a small umbilical hernia that protrudes and  can mostly reduce.  No other evidence of hernia.  No palpable mass.  He  is brought to the operating room electively.   OPERATIVE TECHNIQUE:  Following the induction of general endotracheal  anesthesia, the patient was identified as correct patient and correct  procedure.  Intravenous antibiotics were given.  The abdomen was prepped  and draped in a sterile fashion.  Marcaine 0.5% with epinephrine was  used as a local infiltration anesthetic.  A curved transverse incision  was made at the lower rim of the umbilicus.  Dissection was carried down  through subcutaneous tissue to the abdominal wall fascia around the  umbilicus.  I dissected the umbilical skin off of the hernia.  The  hernia sac was trimmed at its edges.  There was only preperitoneal fat  in the hernia.  The subcutaneous fat was undermined a little bit  circumferentially.  The hernia defect was only about 1.5-2.0 cm in  diameter.  It was very small.  I did not feel that mesh was warranted.  I could palpate within the defect and found no  other defects superiorly  or inferiorly.  I closed the hernia defect transversely with interrupted  sutures of #1 Novofil.  Laterally, these were simple sutures and  centrally, I used a vest-over-pants technique.  After all the sutures  were placed, I then pulled them up which created a very nice overlap of  the fascia.  We then tied all of the sutures.  The wound was irrigated  with saline.  The umbilicus was tacked back to the fascia with an  interrupted suture of 3-0 Vicryl.  The subcutaneous tissue was  closed with 3-0 Vicryl sutures and the skin closed with a running  subcuticular suture of 4-0 Monocryl and Steri-Strips.  Clean bandages  were placed and the patient taken to the recovery room in stable  condition.  Estimated blood loss was about 10 mL.  Complications none.  Sponge, needle, and instrument counts were correct.      Angelia Mould. Derrell Lolling, M.D.  Electronically Signed     HMI/MEDQ  D:  11/22/2006  T:  11/22/2006  Job:  [id number]   cc:   [facility]   Dr. Zenda Alpers, [facility]

## 2010-12-17 NOTE — Discharge Summary (Signed)
[facility]  Patient:    [name] [name], [name] [name]                   MRN: [id number] Adm. Date:  [id number] Disc. Date: [id number] Attending:  Doneta Public CC:         Maryelizabeth Rowan, M.D., [facility]                           Discharge Summary  DATE OF BIRTH:  March 15, 1946  PRIMARY DIAGNOSIS:  Chest pain.  SECONDARY DIAGNOSES: 1. Hypertension. 2. Gastroesophageal reflux disease. 3. Elevated LDL cholesterol as well as total cholesterol.  HISTORY OF PRESENT ILLNESS:  This 65 year old white male presented to the ER complaining of sharp substernal chest pain that was more intense than the chest pain he had had previously.  This was not associated with any radiation or diaphoresis.  The patient has a significant medical history of hypertension, recently diagnosed and recently placed on Diovan as well as gastroesophageal reflux disease for which he was recently placed on Prevacid.  Other significant history includes multiple orthopedic surgeries to the right knee and ankle after a fall from a ladder.  Most recently in March, the patient has had surgery on both right knee and ankle and has not yet been fully ambulatory.  HOSPITAL COURSE:  The patient was admitted to telemetry and worked up for rule out MI.  Cardiac enzymes and ECG were essentially normal.  Cardiology was consulted and cardiac catheterization was suggested and performed. Catheterization was unremarkable showing a normal ejection fraction, normal coronary arteries and normal left ventricular function.  While in hospital, cholesterol panels were obtained which showed elevated total cholesterol at 215, elevated LDL at 154.  A nutrition consult was obtained and the patient was counseled according to a low fat, low cholesterol diet by the nutritionist.  PROCEDURES:  Included a cardiac catheterization.  CONSULTATIONS:  Include, cardiologist, Dr.  Tresa Endo.  DISCHARGE CONDITION:  The patient was discharged home in a stable condition and was instructed to follow up at the [facility] which he requested to be seen as a new patient at our [facility] and will follow up on August 3.  ACTIVITY:  No strenuous activity for three days including lifting five or more pounds, driving an automobile, and sexual activity.  DIET:  Low fat, low calorie, and low sodium.  WOUND CARE:  The patient has a Perclose at the site of cardiac catheterization and was directed to refrain from tub bathing or swimming pools for at least one week.  However, the patient may shower.  SPECIAL INSTRUCTIONS:  The patient was instructed to call Dr. Ellin Goodie office at [phone] if there is any bleeding or hematoma.  DISCHARGE MEDICATIONS: 1. Celebrex 200 mg daily. 2. Enteric-coated aspirin one tablet daily. 3. Prevacid 30 mg daily one tablet at night. 4. Diovan 80 mg one tablet daily. 5. Percocet as needed for pain. 6. Multivitamin daily. 7. Vitamin E, vitamin C and Sal Palmetto daily. DD:  02/24/00 TD:  02/28/00 Job: [id number]

## 2010-12-22 ENCOUNTER — Ambulatory Visit (INDEPENDENT_AMBULATORY_CARE_PROVIDER_SITE_OTHER): Payer: 59 | Admitting: Psychology

## 2010-12-22 DIAGNOSIS — F331 Major depressive disorder, recurrent, moderate: Secondary | ICD-10-CM

## 2011-01-12 ENCOUNTER — Ambulatory Visit: Payer: 59 | Admitting: Psychology

## 2011-01-21 ENCOUNTER — Telehealth: Payer: Self-pay | Admitting: Family Medicine

## 2011-01-21 NOTE — Telephone Encounter (Signed)
Patient went to ED out of town on 01/19/2011 for dehydration. He was given  three bags of fluids . BP was low upon arrival he states. He came home on the evening of 01/19/2011 and since then has been feeling tired and washed out. Advised patient that he should be drinking lots of clear liquids , gatoraid, etc, also advised that he really needs to go to an Urgent Care to be evaluated to check BP and possibly recheck labs.  He voices understanding and will go now he states.

## 2011-01-21 NOTE — Telephone Encounter (Signed)
Pt calling to speak with RN, was recently hospitalized for dehydration, wants to know if hes doing all the right things?

## 2011-02-08 ENCOUNTER — Ambulatory Visit (INDEPENDENT_AMBULATORY_CARE_PROVIDER_SITE_OTHER): Payer: Self-pay | Admitting: Family Medicine

## 2011-02-08 ENCOUNTER — Encounter: Payer: Self-pay | Admitting: Family Medicine

## 2011-02-08 VITALS — BP 138/87 | HR 73 | Temp 98.0°F | Ht 73.0 in | Wt 315.6 lb

## 2011-02-08 DIAGNOSIS — R5383 Other fatigue: Secondary | ICD-10-CM

## 2011-02-08 DIAGNOSIS — E860 Dehydration: Secondary | ICD-10-CM

## 2011-02-08 LAB — TSH: TSH: 0.833 u[IU]/mL (ref 0.350–4.500)

## 2011-02-08 LAB — CBC
HCT: 49.3 % (ref 39.0–52.0)
MCV: 86.6 fL (ref 78.0–100.0)
RBC: 5.69 MIL/uL (ref 4.22–5.81)
WBC: 7.6 10*3/uL (ref 4.0–10.5)

## 2011-02-08 LAB — BASIC METABOLIC PANEL
Glucose, Bld: 93 mg/dL (ref 70–99)
Potassium: 4.3 mEq/L (ref 3.5–5.3)
Sodium: 138 mEq/L (ref 135–145)

## 2011-02-08 LAB — POCT URINALYSIS DIPSTICK
Bilirubin, UA: NEGATIVE
Glucose, UA: NEGATIVE
Ketones, UA: NEGATIVE
Spec Grav, UA: 1.02

## 2011-02-08 NOTE — Patient Instructions (Signed)
We will get some blood work today.  I will call you if anything is concerning. I would like you to come back to see [name] in about 2 weeks just to check in and see how things are going.

## 2011-02-08 NOTE — Assessment & Plan Note (Signed)
It is likely that the patient's problems are merely caused by a prolonged recovery from his experience. However, as this is a slightly prolonged course for simple dehydration will obtain a CBC, basic metabolic panel, and a TSH. The patient's symptoms could also be related to depression secondary to his wife's death. My initial feelings on this are that it is not likely as the patient points to his episode of dehydration as the acute inciting event. If symptoms persist, however, this is an avenue that will need to be further explored.
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
## 2011-02-09 ENCOUNTER — Ambulatory Visit: Payer: 59 | Admitting: Psychology

## 2011-02-23 ENCOUNTER — Ambulatory Visit (INDEPENDENT_AMBULATORY_CARE_PROVIDER_SITE_OTHER): Payer: 59 | Admitting: Family Medicine

## 2011-02-23 ENCOUNTER — Encounter: Payer: Self-pay | Admitting: Family Medicine

## 2011-02-23 DIAGNOSIS — E860 Dehydration: Secondary | ICD-10-CM

## 2011-02-23 DIAGNOSIS — E669 Obesity, unspecified: Secondary | ICD-10-CM

## 2011-02-23 NOTE — Assessment & Plan Note (Signed)
The patient has a goal of weighing less than 210 pounds. He reports that he has a diet plan in place, and that as soon as his right ankle recovers from an injury he plans on resuming biking for exercise. He reports that most of his weight gain has been over the last year and a half during which time he had been taking care of his wife who was very sick. He is very confident in his ability to lose this weight. Encouragement was provided at this visit.

## 2011-02-23 NOTE — Assessment & Plan Note (Signed)
Does the patient's episode of dehydration and all of his symptoms related to would appear to be completely resolved. The patient's laboratory evaluation was unremarkable. At this point in time I do not feel that any additional workup is required. The patient is instructed to follow-up on an as needed basis.

## 2011-02-23 NOTE — Progress Notes (Signed)
Subjective: the patient returns today for follow-up on his dehydration. At the patient's last visit, he reported that he was still having problems with feeling tired, and washed out. Laboratory studies obtained at that visit demonstrated no significant abnormalities. The patient reports that he is feeling much better today, and has no current active concerns. He denies significant weakness, fatigue, chest pain, shortness of breath, or lower extremity swelling.  Objective: vitals reviewed. Blood pressure remains mildly elevated, however the patient does does not wish to change any medications at this point in time. General: no acute distress. Cardiovascular: regular rate and rhythm, no murmurs rubs or gallops. Respiratory: cleared auscultation bilaterally. Extremities: 2+ pulses, no edema, less than 2 second capillary refill

## 2011-02-23 NOTE — Patient Instructions (Signed)
It was great to see you today! I'm glad you are feeling so much better. The labs that we did at your last visit were all normal. I am glad that you are so motivated to lose weight. Please let me know if there's anything I can do to help.

## 2011-06-16 ENCOUNTER — Ambulatory Visit (INDEPENDENT_AMBULATORY_CARE_PROVIDER_SITE_OTHER): Payer: 59 | Admitting: Family Medicine

## 2011-06-16 ENCOUNTER — Encounter: Payer: Self-pay | Admitting: Family Medicine

## 2011-06-16 DIAGNOSIS — I10 Essential (primary) hypertension: Secondary | ICD-10-CM

## 2011-06-16 DIAGNOSIS — E7800 Pure hypercholesterolemia, unspecified: Secondary | ICD-10-CM

## 2011-06-16 MED ORDER — EZETIMIBE 10 MG PO TABS: 10.0000 mg | ORAL_TABLET | Freq: Every day | ORAL | Status: DC

## 2011-06-16 MED ORDER — LISINOPRIL-HYDROCHLOROTHIAZIDE 20-12.5 MG PO TABS: 1.0000 | ORAL_TABLET | Freq: Every day | ORAL | Status: DC

## 2011-06-16 NOTE — Assessment & Plan Note (Signed)
Patient is not fasting today. Has a previous history of a bad reaction to a medicine for cholesterol, he does not know which. There are some financial issues surrounding his current cholesterol medication. I will hold off on having him restart this on to give obtain fasting lipid panel. I've given him a prescription so that he can present at several pharmacies to decide which works best for him.

## 2011-06-16 NOTE — Assessment & Plan Note (Signed)
Given the patient's morbid obesity, I feel that a goal of less than 140/90 would be ideal. Accordingly we will restart his combination antihypertensive. I have printed this prescription and given it to him so that he can precipitate several pharmacies.

## 2011-06-16 NOTE — Patient Instructions (Signed)
It was great to see you today!  Sorry things are stressful with family. I am giving you printed copies of your prescriptions so you can shop around for the best price. I have put in orders for you to get some fasting blood work later this month.  I will let you know if you need to be on the Zetia after I get the results.

## 2011-06-16 NOTE — Progress Notes (Signed)
Subjective: The patient reports that for the last 8 months he has not been taking any of his medications. He has been having problems with his insurance, and is just not felt like taking them. He presents today to discuss what medicines he needs to restart. Is not having any chest pain, shortness of breath, headaches, nausea, vomiting, diarrhea, bruising, visual changes, or problems urinating. He does report significant family stresses in his life currently and continued weight gain.  Objective:  Filed Vitals:   06/16/11 1420  BP: 147/87  Pulse: 80   Gen: Morbidly obese male, in no acute distress CV: Regular rate and rhythm Resp: Clear to auscultation bilaterally, no focal findings, exam limited by body habitus Abd: Obese, no focal findings Extremities: 2+ pulses, trace lower extremity edema  Assessment/Plan: Please also see individual problems in problem list for problem-specific plans.

## 2011-06-20 ENCOUNTER — Other Ambulatory Visit: Payer: 59

## 2011-06-20 DIAGNOSIS — I10 Essential (primary) hypertension: Secondary | ICD-10-CM

## 2011-06-20 DIAGNOSIS — E7800 Pure hypercholesterolemia, unspecified: Secondary | ICD-10-CM

## 2011-06-20 LAB — LIPID PANEL
LDL Cholesterol: 179 mg/dL — ABNORMAL HIGH (ref 0–99)
Total CHOL/HDL Ratio: 6.6 Ratio
VLDL: 24 mg/dL (ref 0–40)

## 2011-06-20 LAB — COMPREHENSIVE METABOLIC PANEL
ALT: 19 U/L (ref 0–53)
AST: 16 U/L (ref 0–37)
Albumin: 4 g/dL (ref 3.5–5.2)
CO2: 26 mEq/L (ref 19–32)
Calcium: 9.1 mg/dL (ref 8.4–10.5)
Chloride: 105 mEq/L (ref 96–112)
Potassium: 4.3 mEq/L (ref 3.5–5.3)
Sodium: 138 mEq/L (ref 135–145)
Total Protein: 6 g/dL (ref 6.0–8.3)

## 2011-06-20 LAB — CBC
MCV: 84.9 fL (ref 78.0–100.0)
Platelets: 210 10*3/uL (ref 150–400)
RBC: 5.77 MIL/uL (ref 4.22–5.81)
WBC: 5.5 10*3/uL (ref 4.0–10.5)

## 2011-06-20 NOTE — Progress Notes (Signed)
CMP,CBC AND FLP DONE TODAY [name] [name] 

## 2011-06-28 ENCOUNTER — Encounter: Payer: Self-pay | Admitting: Family Medicine

## 2011-06-28 ENCOUNTER — Other Ambulatory Visit: Payer: Self-pay | Admitting: Family Medicine

## 2011-06-28 MED ORDER — EZETIMIBE 10 MG PO TABS: 10.0000 mg | ORAL_TABLET | Freq: Every day | ORAL | Status: DC

## 2011-08-02 DIAGNOSIS — C4490 Unspecified malignant neoplasm of skin, unspecified: Secondary | ICD-10-CM

## 2011-08-02 HISTORY — DX: Unspecified malignant neoplasm of skin, unspecified: C44.90

## 2011-08-05 ENCOUNTER — Encounter: Payer: Self-pay | Admitting: Family Medicine

## 2011-08-05 ENCOUNTER — Ambulatory Visit (INDEPENDENT_AMBULATORY_CARE_PROVIDER_SITE_OTHER): Payer: 59 | Admitting: Family Medicine

## 2011-08-05 ENCOUNTER — Ambulatory Visit: Payer: 59 | Admitting: Family Medicine

## 2011-08-05 DIAGNOSIS — F329 Major depressive disorder, single episode, unspecified: Secondary | ICD-10-CM

## 2011-08-05 DIAGNOSIS — R05 Cough: Secondary | ICD-10-CM

## 2011-08-05 NOTE — Assessment & Plan Note (Addendum)
I suspect depression was his and/or his children's main concern to coming in today.  No SI, HI.  Struggling with the holidays and the recent death of his wife.  Has been helped by medication before, declines need to start at thsi time.  Is very open to referral to Dr. Pascal Lux, states they had a good rapport when he saw her years ago.  I gave him info to contact her for therapy.  Patient to follow-up with PCP in 4 weeks.  I gave him a "prescription" to rest and prioritize his health.  He is considering taking some time off.

## 2011-08-05 NOTE — Progress Notes (Signed)
  Subjective:    Patient ID: [name] [name], male    DOB: 26-Jun-1946, 66 y.o.   MRN: [id number]  HPIHere for evaluation of 4 months of cough  Has been treated for bronchitis several times, with azithromycin, and most recently levofloxacin since 12/31 by an urgent care who states he had a pneumonia on his chest xray.  Does feel like he gets completely better in between episodes lasting for a few weeks before he has another episode.  Symptoms of fatigue, cough with sputum at times, other times dry.  Has dyspnea with walking in the house.  But sometimes it is ok even when walking.  No chest pain.  At times lightheadedness.  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  Has a history of depression with suicidal  Ideation in past, none in recent times.  No significant history of smoking < 100 cigs in a lifetime.  Review of SystemsGen:  No fever, chills, unexplained weight loss Nose:  No rhinorrhea but some nasal congestion,  Throat:  No sore throat or dysphagia CV:  No chest pain, palpitations, PND, dyspnea on exertion, or edema Resp: No wheezing Abd: No nausea, vomting, diarrhea, constipation Neuro:  No headache, numbness, weakness, tingling, syncope.       Objective:   Physical Exam GEN: Alert & Oriented, No acute distress CV:  Regular Rate & Rhythm, no murmur Respiratory:  Normal work of breathing, CTAB Abd:  + BS, soft, no tenderness to palpation Ext: no pre-tibial edema         Assessment & Plan:

## 2011-08-05 NOTE — Assessment & Plan Note (Signed)
Normal course of improvement for recent episode of pneumonia.  Discussed normal course of post viral cough.  Dyspnea and fatigue multifactorial, some will likely resolve with recovery from pneumonia.   Reassured that dyspnea completely resolves in between infections and does not seem progressive.  Reviewed recent labwork.  CBC normal.  High stress and depression likely contributing to chronic fatigue  Advised to finish course of levaquin from Urgent care for treatment of pneumonia.  Follow-up with PCP in 4 weeks to re-evaluate cough/dyspnea.  If continues past expected course, will pursue further evaluation.  Given red flags for earlier return.

## 2011-08-05 NOTE — Patient Instructions (Signed)
I think your bronchitis is moving in the right direction.  Your symptoms are expected 3-4 days after starting treatment for bronchitis or pneumonia  I am reassured that you get better in between your episodes of bronchitis.  The cough can last for 4-6 weeks.  I expect you to continue to feel less tired and breathe better.  If your cough does not go away or you have trouble breathing, please make an appointment and we will pursue further treatment and evaluation based on your symptoms at that time.  I would like you to prioritize your health and take the time you need to rest.  Please contact Dr. Pascal Lux, clinical psychologist, to set up an appointment.  She can be reached at [phone].    Follow-up with Dr. Louanne Belton in 4 weeks

## 2011-08-12 ENCOUNTER — Encounter: Payer: Self-pay | Admitting: Family Medicine

## 2011-08-12 ENCOUNTER — Ambulatory Visit (INDEPENDENT_AMBULATORY_CARE_PROVIDER_SITE_OTHER): Payer: 59 | Admitting: Family Medicine

## 2011-08-12 ENCOUNTER — Telehealth: Payer: Self-pay | Admitting: Family Medicine

## 2011-08-12 VITALS — BP 124/83 | HR 85 | Temp 97.3°F | Ht 73.0 in | Wt 322.7 lb

## 2011-08-12 DIAGNOSIS — R110 Nausea: Secondary | ICD-10-CM

## 2011-08-12 MED ORDER — ONDANSETRON 8 MG PO TBDP: 8.0000 mg | ORAL_TABLET | Freq: Three times a day (TID) | ORAL | Status: AC | PRN

## 2011-08-12 MED ORDER — PROMETHAZINE HCL 25 MG PO TABS: 25.0000 mg | ORAL_TABLET | Freq: Three times a day (TID) | ORAL | Status: DC | PRN

## 2011-08-12 MED ORDER — OMEPRAZOLE 20 MG PO CPDR: 40.0000 mg | DELAYED_RELEASE_CAPSULE | Freq: Every day | ORAL | Status: DC

## 2011-08-12 NOTE — Telephone Encounter (Signed)
Patient reports treatment for bronchitis , pneumonia couple of times since last Fall. Most recently he has just finished a course of Levofloxican for 10 days . Now  for 2 days he has had queeziness and nausea and feels very tired. No fever, cough has much improved but still has a dry cough. Appointment scheduled today for evaluation.

## 2011-08-12 NOTE — Assessment & Plan Note (Signed)
Isolated nausea. S/p long duration of antibiotics levaquin/azithromycin. Gave one week PPI. zofran and phenergan.

## 2011-08-12 NOTE — Patient Instructions (Signed)
It was great to see you today!  Schedule an appointment to see your PCP as needed  I sent in your medications for nausea.  If any of the red flags we discussed comes up, call our office.

## 2011-08-12 NOTE — Progress Notes (Signed)
  Subjective:   1. Nausea.   [name] [name] is a 66 y.o. male who presents for evaluation of nausea and vomiting. Onset of symptoms was 2 days ago. Patient describes nausea as moderate. No Vomiting has occurred. Symptoms have been associated with recent 14 days of antibiotics including levaquin and azithromycin. Patient denies alcohol overuse, fever, hematemesis, melena and diarrhea, burping, constipation, dysuria. Symptoms have been intermittent. Evaluation to date has been none. Treatment to date has been none.  No recent food ingestion that was suspicious. No recent travel. No sick contacts.  2. Pneumonia follow up Patient was diagnosed with pneumonia in [place] by xray. He finished a z-pack which didn't work and then a 10 day course of Levaquin, which worked. He now has nausea.   Review of Systems Pertinent items are noted in HPI. No fever, chills, night sweats, weight loss.  Objective:   Filed Vitals:   08/12/11 1336  BP: 124/83  Pulse: 85  Temp: 97.3 F (36.3 C)  TempSrc: Oral  Height: 6\' 1"  (1.854 m)  Weight: 322 lb 11.2 oz (146.376 kg)  SpO2: 93%   Extremities:  No cyanosis, edema, or deformity noted with good range of motion of all major joints.   Abdomen: soft and non-tender without masses, organomegaly or hernias noted.  No guarding or rebound Obese Lungs:  Normal respiratory effort, chest expands symmetrically. Lungs are clear to auscultation, no crackles or wheezes. Heart - Regular rate and rhythm.  No murmurs, gallops or rubs.     Back - Normal skin, Spine with normal alignment and no deformity.  No tenderness to vertebral process palpation.  Paraspinous muscles are not tender and without spasm.   Range of motion is full at neck and lumbar sacral regions  Assessment:

## 2011-08-12 NOTE — Telephone Encounter (Signed)
Would like to speak to someone about some symptoms he is having.  He isn't sure he needs to be seen.

## 2011-08-22 ENCOUNTER — Ambulatory Visit (INDEPENDENT_AMBULATORY_CARE_PROVIDER_SITE_OTHER): Payer: Self-pay | Admitting: Emergency Medicine

## 2011-08-22 ENCOUNTER — Encounter: Payer: Self-pay | Admitting: Emergency Medicine

## 2011-08-22 VITALS — BP 126/76 | HR 98 | Temp 98.3°F | Ht 73.0 in | Wt 324.0 lb

## 2011-08-22 DIAGNOSIS — J40 Bronchitis, not specified as acute or chronic: Secondary | ICD-10-CM

## 2011-08-22 NOTE — Assessment & Plan Note (Signed)
Episodes of recurrent wheezing, cough, dyspnea that sounded like viral upper rest or infections with possible superimposed bacterial bronchitis. He is now back to baseline. His chest x-ray did not show pneumonia. The lingering issue is the reason behind recurrent infections in such a short time period.  - At this time I will defer pulmonary function testing - If he has a recurrent episode or worsens in any way then we will perform PFTs and possibly recurrent imaging.

## 2011-08-22 NOTE — Progress Notes (Signed)
Subjective:    Patient ID: [name] [name], male    DOB: Dec 05, 1945, 66 y.o.   MRN: [id number]  HPI 66 yo never smoker, hx of HTN. Has been well until 10/12 when he had apparent URI with cough, wheeze. Lasted about 8 days then better. Happened again in 11/12 for about 10 days. Then end of Dec '12 he had another similar illness, was treated with azithro and then levoflox x 10 days. CXR at that time showed no infiltrates. The illness lasted about 20 days. He now finally feels back to baseline.   He has put on 50 lbs over the last yr.    Review of Systems  Constitutional: Negative for fever and unexpected weight change.  HENT: Positive for congestion. Negative for ear pain, nosebleeds, sore throat, rhinorrhea, sneezing, trouble swallowing, dental problem, postnasal drip and sinus pressure.   Eyes: Negative.  Negative for redness and itching.  Respiratory: Positive for cough and shortness of breath. Negative for chest tightness and wheezing.   Cardiovascular: Negative.  Negative for palpitations and leg swelling.  Gastrointestinal: Negative.  Negative for nausea and vomiting.  Genitourinary: Negative.  Negative for dysuria.  Musculoskeletal: Negative for joint swelling.  Skin: Negative.  Negative for rash.  Neurological: Negative.  Negative for headaches.  Hematological: Negative.  Does not bruise/bleed easily.  Psychiatric/Behavioral: Negative for dysphoric mood. The patient is nervous/anxious.     Past Medical History  Diagnosis Date  . GERD (gastroesophageal reflux disease)   . Depression   . Hypertension   . Arthritis      Family History  Problem Relation Age of Onset  . Emphysema Father   . Heart disease Mother   . Cancer Mother     mastoid     History   Social History  . Marital Status: Widowed    Spouse Name: N/A    Number of Children: N/A  . Years of Education: N/A   Occupational History  . Works as a Optician, dispensing x 45 yrs, also worked as a IT sales professional many yrs ago,  no known inhalational injury   Social History Main Topics  . Smoking status: Never Smoker   . Smokeless tobacco: Not on file  . Alcohol Use: No  . Drug Use: No  . Sexually Active: Not on file   Other Topics Concern  . Not on file   Social History Narrative  . No narrative on file     Allergies  Allergen Reactions  . Lodine (Etodolac) Nausea And Vomiting     Outpatient Prescriptions Prior to Visit  Medication Sig Dispense Refill  . aspirin 81 MG EC tablet Take 81 mg by mouth daily.        [name] ezetimibe (ZETIA) 10 MG tablet Take 1 tablet (10 mg total) by mouth daily.  30 tablet  11  . lisinopril-hydrochlorothiazide (PRINZIDE,ZESTORETIC) 20-12.5 MG per tablet Take 1 tablet by mouth daily.  90 tablet  2  . omeprazole (PRILOSEC) 20 MG capsule Take 2 capsules (40 mg total) by mouth daily.  7 capsule  0  . temazepam (RESTORIL) 7.5 MG capsule Take 15 mg by mouth at bedtime as needed.              Objective:   Physical Exam Gen: Pleasant, obese, in no distress,  normal affect  ENT: No lesions,  mouth clear,  oropharynx clear, no postnasal drip  Neck: No JVD, no TMG, no carotid bruits  Lungs: No use of accessory muscles, no dullness to percussion, clear  without rales or rhonchi  Cardiovascular: RRR, heart sounds normal, no murmur or gallops, no peripheral edema  Abdomen: soft and NT, no HSM,  BS normal  Musculoskeletal: No deformities, no cyanosis or clubbing  Neuro: alert, non focal  Skin: Warm, no lesions or rashes     Assessment & Plan:  Bronchitis Episodes of recurrent wheezing, cough, dyspnea that sounded like viral upper rest or infections with possible superimposed bacterial bronchitis. He is now back to baseline. His chest x-ray did not show pneumonia. The lingering issue is the reason behind recurrent infections in such a short time period.  - At this time I will defer pulmonary function testing - If he has a recurrent episode or worsens in any way then we will  perform PFTs and possibly recurrent imaging.

## 2011-08-22 NOTE — Patient Instructions (Signed)
Will not perform any formal breathing tests at this time.  Please call our office if you have recurrent symptoms of an upper respiratory infection. If this process returns, then we will perform more extensive testing.  Follow with Dr Delton Coombes if you develop such symptoms or if you develop any breathing problems.

## 2011-09-05 ENCOUNTER — Ambulatory Visit (INDEPENDENT_AMBULATORY_CARE_PROVIDER_SITE_OTHER): Payer: Self-pay | Admitting: Family Medicine

## 2011-09-05 ENCOUNTER — Encounter: Payer: Self-pay | Admitting: Family Medicine

## 2011-09-05 DIAGNOSIS — R110 Nausea: Secondary | ICD-10-CM

## 2011-09-05 DIAGNOSIS — E669 Obesity, unspecified: Secondary | ICD-10-CM

## 2011-09-05 DIAGNOSIS — I10 Essential (primary) hypertension: Secondary | ICD-10-CM

## 2011-09-05 NOTE — Patient Instructions (Signed)
I want you to get some Metamucil and try taking it two times per year. I want you to try cutting back on your caffeine to only two cups of coffee per year. Come back to see me some time in April.

## 2011-09-05 NOTE — Progress Notes (Signed)
Subjective: The patient is a 66 y.o. year old male who presents today for follow up.  1) HTN:  No dizziness, blacking out.  Very occasional palpitations, (once every month or two).  2) Stools:  Reports some problems with loose stools.  Normal color stool.  Has had some problems with stool leakage.  Up to 6 stools per day.  Has been going on for the last 6 months.  Has noticed sausage makes worse.  Has tried increasing fiber in his diet but not through fiber supplements.  Drinks 3-5 cups coffee per day.  3) Nausea: Still having occasional problems with nausea.  This has been a problem since his abx  Objective:  Filed Vitals:   09/05/11 1407  BP: 105/75  Pulse: 79  Temp: 97.9 F (36.6 C)   Gen: Morbidly obese male, NAD CV: RRR Resp: CTABL Ext: 2+ pulses, no edema  Assessment/Plan: Will suggest decreased caffiene and increased fiber supplement for abd complaints.  Uncertain etiology but ? IBS.  Please also see individual problems in problem list for problem-specific plans.

## 2011-09-06 NOTE — Assessment & Plan Note (Signed)
Well controlled Cont current meds 

## 2011-09-06 NOTE — Assessment & Plan Note (Signed)
Uncertain etiology.  Cont to watch.

## 2011-09-06 NOTE — Assessment & Plan Note (Signed)
Encouraged weight loss.  Discussed importance of aerobic exercise.

## 2011-10-04 ENCOUNTER — Other Ambulatory Visit: Payer: Self-pay | Admitting: Family Medicine

## 2011-10-04 MED ORDER — LISINOPRIL-HYDROCHLOROTHIAZIDE 20-12.5 MG PO TABS: 1.0000 | ORAL_TABLET | Freq: Every day | ORAL | Status: DC

## 2012-01-24 ENCOUNTER — Ambulatory Visit: Payer: Self-pay | Admitting: Family Medicine

## 2012-01-25 ENCOUNTER — Ambulatory Visit (INDEPENDENT_AMBULATORY_CARE_PROVIDER_SITE_OTHER): Payer: Medicare HMO | Admitting: Family Medicine

## 2012-01-25 ENCOUNTER — Encounter: Payer: Self-pay | Admitting: Family Medicine

## 2012-01-25 VITALS — BP 119/78 | HR 75 | Temp 98.2°F | Ht 73.0 in | Wt 328.0 lb

## 2012-01-25 DIAGNOSIS — F3289 Other specified depressive episodes: Secondary | ICD-10-CM

## 2012-01-25 DIAGNOSIS — F329 Major depressive disorder, single episode, unspecified: Secondary | ICD-10-CM

## 2012-01-25 MED ORDER — CITALOPRAM HYDROBROMIDE 20 MG PO TABS: 20.0000 mg | ORAL_TABLET | Freq: Every day | ORAL | Status: DC

## 2012-01-25 NOTE — Patient Instructions (Addendum)
Today we talked about your moods--depression but also grief and life stress. These are normal responses to life Consider taking the advice that you would give others and reach out to those in your life that might support you through this Today we will start celexa  I would like you to see Dr. Louanne Belton in 1-2 weeks, to check in and make sure that things are progressing  I recommend that you meet with our psychologist, Dr. Spero Geralds, for help dealing with your depression.  You can schedule an appointment with her by calling her directly at [phone].

## 2012-01-26 ENCOUNTER — Encounter: Payer: Self-pay | Admitting: Family Medicine

## 2012-01-26 NOTE — Assessment & Plan Note (Signed)
We discussed his options for support in his family. He is also willing to call Dr. Pascal Lux who he is seen in the past. We will restart Celexa at 20 mg. He is to followup with his regular doctor in one to 2 weeks

## 2012-01-26 NOTE — Progress Notes (Signed)
  Subjective:    Patient ID: [name] [name], male    DOB: 1946-01-14, 66 y.o.   MRN: [id number]  HPI  Patient presents for evaluation of mood. He says that since last January he has been feeling low energy and like he is not motivated to do his normal activities. In the last several years he has lost his mother and father in last year he lost his wife. 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 He lives alone in that he has family close by he is not told him about his difficulties with mood. He has not reached out anyone for support. He feels that he might be getting a little but more isolated because he does not have the energy to go and socialize with people. Years ago, he took Celexa which helped. Because he was feeling better at that time he stopped the Celexa. He denies suicidal ideation or homicidal ideation. We discussed how he might tell a member of his congregation how to deal with this. He says that he would say that people were praying for them and that they should get support from family and friends. We discussed that he might take his own advice and open up if he felt comfortable with that. He says that he has several brothers and sisters that he could call for help. He is an oldest child and does not typically asked for help.   Review of Systems See above    Objective:   Physical Exam  Vital signs reviewed General appearance - alert, well appearing, and in no distress Psych-normal eye contact, normal thought processes. Not tearful or anxious      Assessment & Plan:

## 2012-02-08 ENCOUNTER — Ambulatory Visit (INDEPENDENT_AMBULATORY_CARE_PROVIDER_SITE_OTHER): Payer: Medicare HMO | Admitting: Family Medicine

## 2012-02-08 ENCOUNTER — Encounter: Payer: Self-pay | Admitting: Family Medicine

## 2012-02-08 VITALS — BP 140/94 | HR 78 | Temp 97.6°F | Ht 73.0 in | Wt 328.6 lb

## 2012-02-08 DIAGNOSIS — M7710 Lateral epicondylitis, unspecified elbow: Secondary | ICD-10-CM

## 2012-02-08 DIAGNOSIS — F329 Major depressive disorder, single episode, unspecified: Secondary | ICD-10-CM

## 2012-02-08 DIAGNOSIS — R110 Nausea: Secondary | ICD-10-CM

## 2012-02-08 DIAGNOSIS — M7711 Lateral epicondylitis, right elbow: Secondary | ICD-10-CM | POA: Insufficient documentation

## 2012-02-08 DIAGNOSIS — Z23 Encounter for immunization: Secondary | ICD-10-CM

## 2012-02-08 LAB — CBC WITH DIFFERENTIAL/PLATELET
Hemoglobin: 16.5 g/dL (ref 13.0–17.0)
Lymphocytes Relative: 20 % (ref 12–46)
Lymphs Abs: 1.4 10*3/uL (ref 0.7–4.0)
Monocytes Relative: 9 % (ref 3–12)
Neutro Abs: 4.5 10*3/uL (ref 1.7–7.7)
Neutrophils Relative %: 66 % (ref 43–77)
RBC: 5.67 MIL/uL (ref 4.22–5.81)

## 2012-02-08 LAB — COMPREHENSIVE METABOLIC PANEL
Albumin: 4.2 g/dL (ref 3.5–5.2)
Alkaline Phosphatase: 52 U/L (ref 39–117)
CO2: 26 mEq/L (ref 19–32)
Chloride: 101 mEq/L (ref 96–112)
Glucose, Bld: 85 mg/dL (ref 70–99)
Potassium: 4.3 mEq/L (ref 3.5–5.3)
Sodium: 134 mEq/L — ABNORMAL LOW (ref 135–145)
Total Protein: 6.6 g/dL (ref 6.0–8.3)

## 2012-02-08 NOTE — Progress Notes (Signed)
Subjective: The patient is a 66 y.o. year old male who presents today for followup of multiple issues.  1. Depression: Patient recently started back on Celexa 20 mg. He is tolerating this well. He reports that his mood is much improved. He has no suicidal or homicidal thoughts.  2. Nausea: The patient is a problems with nausea chronically now since January 2013. The nausea is not associated with any burning in his throat, is not associated with emesis, and is not associated with any change in his bowel habits. He has not had any significant change in his weight. He has been taking Phenergan was prescribed to him in January. He reports that this is helpful. He has never had problems like this before.  3. Elbow pain: Patient has not been having problems with right elbow pain for the last month or 2. There is been no obvious inciting factor. The patient is right-handed but denies tennis or golf.  Patient's past medical, social, and family history were reviewed and updated as appropriate. History  Substance Use Topics  . Smoking status: Never Smoker   . Smokeless tobacco: Not on file  . Alcohol Use: No   Objective:  Filed Vitals:   02/08/12 0929  BP: 140/94  Pulse: 78  Temp: 97.6 F (36.4 C)   Gen: Morbidly obese male, no acute distress CV: Regular rate and rhythm, no murmurs appreciated Resp: Clear to auscultation bilaterally Abdomen: Obese, nontender, nondistended Ext: 2+ pulses, no edema patient has tenderness over the lateral epicondyles of the right arm. Pain can be elicited by use of the wrist extensors.  Assessment/Plan: Prescription was given for Zostavax. Please also see individual problems in problem list for problem-specific plans.

## 2012-02-08 NOTE — Assessment & Plan Note (Signed)
Much improved. We will plan on continuing Celexa for at least 6 months prior to discussing discontinuation.

## 2012-02-08 NOTE — Assessment & Plan Note (Signed)
Discussed with the patient the importance of avoiding any activities that cause discomfort. Specifically identified teeth brushing as a possible aggravating factor. Have recommended a brace and use of left hand for brushing teeth for the next one month. Patient to return at that time if it is not significantly improved. At that time I would consider a nitroglycerin patch.

## 2012-02-08 NOTE — Patient Instructions (Signed)
It was great to see you today! We will refer you to see a GI doctor for your nausea. I would like you to try to stop brushing your teeth with your right hand. If you need to come back for labs, please schedule a lab appointment at the front desk on your way out. If we drew labs today, I will call you if any results are abnormal.  Otherwise you will get a letter in the mail.

## 2012-02-08 NOTE — Assessment & Plan Note (Signed)
The patient's nausea continues to be problematic. I am somewhat concerned by his persistence. There are no obvious causes. The patient is not necessarily at high risk for any malignancy do to his negative alcohol and tobacco history, but I do feel that he warms and evaluation by GI. I will make this referral today.

## 2012-02-09 ENCOUNTER — Encounter: Payer: Self-pay | Admitting: Gastroenterology

## 2012-02-09 ENCOUNTER — Telehealth: Payer: Self-pay | Admitting: *Deleted

## 2012-02-09 NOTE — Telephone Encounter (Signed)
Patient calls reporting that the injection site where he received Pneumovax yesterday is sore and  has a knot size of a walnut. No warmth or redness to area. Advised to take tylenol , apply ice pack. Watch and call back if worsens  or enlarges call us back.[name] [name]

## 2012-02-13 ENCOUNTER — Encounter: Payer: Self-pay | Admitting: Family Medicine

## 2012-02-13 ENCOUNTER — Telehealth: Payer: Self-pay | Admitting: Gastroenterology

## 2012-02-13 NOTE — Telephone Encounter (Signed)
Pt states he is having a lot of nausea and requesting to be seen sooner. Pt scheduled to see Dr. Arlyce Dice 02/15/12@9 :30am. Pt aware of appt date and time.

## 2012-02-13 NOTE — Telephone Encounter (Signed)
Patient would like to be seen sooner.  Was sick all weekend.

## 2012-02-15 ENCOUNTER — Ambulatory Visit (INDEPENDENT_AMBULATORY_CARE_PROVIDER_SITE_OTHER): Payer: Medicare HMO | Admitting: Gastroenterology

## 2012-02-15 ENCOUNTER — Encounter: Payer: Self-pay | Admitting: Gastroenterology

## 2012-02-15 VITALS — BP 128/86 | HR 72 | Ht 73.0 in | Wt 326.8 lb

## 2012-02-15 DIAGNOSIS — R110 Nausea: Secondary | ICD-10-CM

## 2012-02-15 NOTE — Assessment & Plan Note (Addendum)
Free standing nausea suggests a medication effect. Ulcer or nonulcer dyspepsia should be ruled out. Gastroparesis is also a consideration.  Recommendations #1 hold Prilosec #2 upper endoscopy #3 if symptoms are not improved after holding Prilosec and endoscopy is negative I will taper and then discontinue Celexa #4 to consider gastric emptying scan pending results of above

## 2012-02-15 NOTE — Patient Instructions (Addendum)
Hold Omeprazole  Your Endoscopy has been scheduled Separate instructions have been given

## 2012-02-15 NOTE — Progress Notes (Signed)
History of Present Illness: [name] [name] 66 year old white male referred at the request of Dr. Louanne Belton for evaluation of nausea. This has been a persistent problem over the past 7-8 months. It is characterized by freestanding nausea that's unrelated to eating. He's had no vomiting. Nausea waxes and wanes but is always present. In October or November, 2012 he started omeprazole and Celexa. He denies pyrosis, sore throat or coughing. He occasionally has a mild crampy lower abdominal discomfort. He has been mildly constipated. He apparently underwent colonoscopy within the last 2 years because of a history of colon polyps.    Past Medical History  Diagnosis Date  . GERD (gastroesophageal reflux disease)   . Depression   . Hypertension   . Arthritis   . Colon polyp    Past Surgical History  Procedure Date  . Appendectomy   . Fracture surgery   . Shoulder arthroscopy   . Leg surgery    family history includes Cancer in his mother; Emphysema in his father; Heart disease in his mother; and Lung cancer in his mother. Current Outpatient Prescriptions  Medication Sig Dispense Refill  . aspirin 81 MG EC tablet Take 81 mg by mouth daily.        . citalopram (CELEXA) 20 MG tablet Take 1 tablet (20 mg total) by mouth daily.  30 tablet  3  . ezetimibe (ZETIA) 10 MG tablet Take 1 tablet (10 mg total) by mouth daily.  30 tablet  11  . lisinopril-hydrochlorothiazide (PRINZIDE,ZESTORETIC) 20-12.5 MG per tablet Take 1 tablet by mouth daily.  90 tablet  2  . omeprazole (PRILOSEC) 20 MG capsule Take 20 mg by mouth daily.      . ondansetron (ZOFRAN) 4 MG tablet Take 4 mg by mouth as needed.      . promethazine (PHENERGAN) 25 MG tablet Take 25 mg by mouth as needed.      . saw palmetto 160 MG capsule Take 160 mg by mouth 2 (two) times daily.      . promethazine (PHENERGAN) 25 MG tablet Take 1 tablet (25 mg total) by mouth every 8 (eight) hours as needed for nausea.  20 tablet  0   Allergies as of 02/15/2012 -  Review Complete 02/15/2012  Allergen Reaction Noted  . Lodine (etodolac) Nausea And Vomiting 09/27/2010    reports that he has never smoked. He has never used smokeless tobacco. He reports that he does not drink alcohol or use illicit drugs.     Review of Systems: Pertinent positive and negative review of systems were noted in the above HPI section. All other review of systems were otherwise negative.  Vital signs were reviewed in today's medical record Physical Exam: General: Obese male in no acute distress Head: Normocephalic and atraumatic Eyes:  sclerae anicteric, EOMI Ears: Normal auditory acuity Mouth: No deformity or lesions Neck: Supple, no masses or thyromegaly Lungs: Clear throughout to auscultation Heart: Regular rate and rhythm; no murmurs, rubs or bruits Abdomen: Soft, non tender and non distended. No masses, hepatosplenomegaly or hernias noted. Normal Bowel sounds. There is no succussion splash Rectal:deferred Musculoskeletal: Symmetrical with no gross deformities  Skin: No lesions on visible extremities Pulses:  Normal pulses noted Extremities: No clubbing, cyanosis, edema or deformities noted Neurological: Alert oriented x 4, grossly nonfocal Cervical Nodes:  No significant cervical adenopathy Inguinal Nodes: No significant inguinal adenopathy Psychological:  Alert and cooperative. Normal mood and affect

## 2012-02-16 ENCOUNTER — Encounter: Payer: Self-pay | Admitting: Gastroenterology

## 2012-03-01 ENCOUNTER — Ambulatory Visit (AMBULATORY_SURGERY_CENTER): Payer: Medicare HMO | Admitting: Gastroenterology

## 2012-03-01 VITALS — BP 132/80 | HR 73 | Temp 97.2°F | Resp 17 | Ht 73.0 in | Wt 326.0 lb

## 2012-03-01 DIAGNOSIS — R110 Nausea: Secondary | ICD-10-CM

## 2012-03-01 MED ORDER — SODIUM CHLORIDE 0.9 % IV SOLN: 500.0000 mL | INTRAVENOUS | Status: DC

## 2012-03-01 NOTE — Progress Notes (Signed)
Patient did not experience any of the following events: a burn prior to discharge; a fall within the facility; wrong site/side/patient/procedure/implant event; or a hospital transfer or hospital admission upon discharge from the facility. (G8907) Patient did not have preoperative order for IV antibiotic SSI prophylaxis. (G8918)  

## 2012-03-01 NOTE — Op Note (Signed)
[facility] [address] [place], [place]  [address]  ENDOSCOPY PROCEDURE REPORT  PATIENT:  [name], [name]  MR#:  [id number] BIRTHDATE:  [birth year], 66 yrs. old  GENDER:  male  ENDOSCOPIST:  Jackston [place]. Arlyce Dice, MD Referred by:  PROCEDURE DATE:  03/01/2012 PROCEDURE:  EGD, diagnostic 43235 ASA CLASS:  Class II INDICATIONS:  nausea  MEDICATIONS:   MAC sedation, administered by CRNA propofol 150mg IV, glycopyrrolate (Robinal) 0.2 mg IV, 0.6cc simethancone 0.6 cc PO TOPICAL ANESTHETIC:  Cetacaine Spray  DESCRIPTION OF PROCEDURE:   After the risks and benefits of the procedure were explained, informed consent was obtained.  The LB [id number] endoscope was introduced through the mouth and advanced to the third portion of the duodenum.  The instrument was slowly withdrawn as the mucosa was fully examined. <<PROCEDUREIMAGES>>  The upper, middle, and distal third of the esophagus were carefully inspected and no abnormalities were noted. The z-line was well seen at the GEJ. The endoscope was pushed into the fundus which was normal including a retroflexed view. The antrum,gastric body, first and second part of the duodenum were unremarkable (see image1, image2, and image3).    Retroflexed views revealed no abnormalities.    The scope was then withdrawn from the patient and the procedure completed.  COMPLICATIONS:  None  ENDOSCOPIC IMPRESSION: 1) Normal EGD RECOMMENDATIONS: 1) My office will arrange for you to have a Gastric Emptying Scan performed. This is a radiology test that gives an idea of how well your stomach functions.  ______________________________ Barbette Hair Arlyce Dice, MD  CC:  Majel Homer MD  n. Rosalie DoctorBarbette Hair. Kaplan at 03/01/2012 08:55 AM  Ardell Isaacs, [id number]

## 2012-03-01 NOTE — Progress Notes (Signed)
Propofol per [facility] crna. See scanned intra procedure report.  All meds titrated per crna ewm 

## 2012-03-01 NOTE — Patient Instructions (Addendum)
Discharge instructions given with verbal understanding. Office will arrange for you to have a Gastric Emptying Scan. Resume previous medications. YOU HAD AN ENDOSCOPIC PROCEDURE TODAY AT THE [facility]: Refer to the procedure report that was given to you for any specific questions about what was found during the examination.  If the procedure report does not answer your questions, please call your gastroenterologist to clarify.  If you requested that your care partner not be given the details of your procedure findings, then the procedure report has been included in a sealed envelope for you to review at your convenience later.  YOU SHOULD EXPECT: Some feelings of bloating in the abdomen. Passage of more gas than usual.  Walking can help get rid of the air that was put into your GI tract during the procedure and reduce the bloating. If you had a lower endoscopy (such as a colonoscopy or flexible sigmoidoscopy) you may notice spotting of blood in your stool or on the toilet paper. If you underwent a bowel prep for your procedure, then you may not have a normal bowel movement for a few days.  DIET: Your first meal following the procedure should be a light meal and then it is ok to progress to your normal diet.  A half-sandwich or bowl of soup is an example of a good first meal.  Heavy or fried foods are harder to digest and may make you feel nauseous or bloated.  Likewise meals heavy in dairy and vegetables can cause extra gas to form and this can also increase the bloating.  Drink plenty of fluids but you should avoid alcoholic beverages for 24 hours.  ACTIVITY: Your care partner should take you home directly after the procedure.  You should plan to take it easy, moving slowly for the rest of the day.  You can resume normal activity the day after the procedure however you should NOT DRIVE or use heavy machinery for 24 hours (because of the sedation medicines used during the test).    SYMPTOMS  TO REPORT IMMEDIATELY: A gastroenterologist can be reached at any hour.  During normal business hours, 8:30 AM to 5:00 PM Monday through Friday, call [phone].  After hours and on weekends, please call the GI answering service at [phone] who will take a message and have the physician on call contact you.   Following upper endoscopy (EGD)  Vomiting of blood or coffee ground material  New chest pain or pain under the shoulder blades  Painful or persistently difficult swallowing  New shortness of breath  Fever of 100F or higher  Black, tarry-looking stools  FOLLOW UP: If any biopsies were taken you will be contacted by phone or by letter within the next 1-3 weeks.  Call your gastroenterologist if you have not heard about the biopsies in 3 weeks.  Our staff will call the home number listed on your records the next business day following your procedure to check on you and address any questions or concerns that you may have at that time regarding the information given to you following your procedure. This is a courtesy call and so if there is no answer at the home number and we have not heard from you through the emergency physician on call, we will assume that you have returned to your regular daily activities without incident.  SIGNATURES/CONFIDENTIALITY: You and/or your care partner have signed paperwork which will be entered into your electronic medical record.  These signatures attest to the  fact that that the information above on your After Visit Summary has been reviewed and is understood.  Full responsibility of the confidentiality of this discharge information lies with you and/or your care-partner.

## 2012-03-02 ENCOUNTER — Telehealth: Payer: Self-pay

## 2012-03-02 ENCOUNTER — Other Ambulatory Visit: Payer: Self-pay | Admitting: Gastroenterology

## 2012-03-02 ENCOUNTER — Telehealth: Payer: Self-pay | Admitting: *Deleted

## 2012-03-02 DIAGNOSIS — R109 Unspecified abdominal pain: Secondary | ICD-10-CM

## 2012-03-02 NOTE — Telephone Encounter (Signed)
  Follow up Call-  Call back number 03/01/2012  Post procedure Call Back phone  # [phone]  Permission to leave phone message Yes     Patient questions:  Do you have a fever, pain , or abdominal swelling? no Pain Score  0 *  Have you tolerated food without any problems? yes  Have you been able to return to your normal activities? yes  Do you have any questions about your discharge instructions: Diet   no Medications  no Follow up visit  no  Do you have questions or concerns about your Care? no  Actions: * If pain score is 4 or above: No action needed, pain <4.

## 2012-03-02 NOTE — Telephone Encounter (Signed)
Pt aware of appt date and time

## 2012-03-02 NOTE — Telephone Encounter (Signed)
Pt scheduled for GES at [facility] 03/08/12 arrival time 7:45am for an 8am appt. Pt to be NPO after midnight and hold Prilosec the day before procedure. Left message for pt to call back.

## 2012-03-05 ENCOUNTER — Ambulatory Visit: Payer: Medicare HMO | Admitting: Gastroenterology

## 2012-03-08 ENCOUNTER — Encounter: Payer: Medicare HMO | Admitting: Gastroenterology

## 2012-03-08 ENCOUNTER — Encounter (HOSPITAL_COMMUNITY)
Admission: RE | Admit: 2012-03-08 | Discharge: 2012-03-08 | Disposition: A | Discharge status: Home / Self Care | Payer: Medicare HMO | Source: Ambulatory Visit | Attending: Gastroenterology | Admitting: Gastroenterology

## 2012-03-08 DIAGNOSIS — R109 Unspecified abdominal pain: Secondary | ICD-10-CM

## 2012-03-08 DIAGNOSIS — R110 Nausea: Secondary | ICD-10-CM | POA: Insufficient documentation

## 2012-03-08 MED ORDER — TECHNETIUM TC 99M SULFUR COLLOID
2.2000 | Freq: Once | INTRAVENOUS | Status: AC | PRN
  Administered 2012-03-08: 2.2 via INTRAVENOUS

## 2012-03-26 ENCOUNTER — Telehealth: Payer: Self-pay | Admitting: *Deleted

## 2012-03-26 ENCOUNTER — Ambulatory Visit (INDEPENDENT_AMBULATORY_CARE_PROVIDER_SITE_OTHER): Payer: Medicare HMO | Admitting: Gastroenterology

## 2012-03-26 ENCOUNTER — Encounter: Payer: Self-pay | Admitting: Gastroenterology

## 2012-03-26 VITALS — BP 112/74 | HR 72 | Ht 73.0 in | Wt 332.4 lb

## 2012-03-26 DIAGNOSIS — R143 Flatulence: Secondary | ICD-10-CM

## 2012-03-26 DIAGNOSIS — R141 Gas pain: Secondary | ICD-10-CM

## 2012-03-26 DIAGNOSIS — R110 Nausea: Secondary | ICD-10-CM

## 2012-03-26 NOTE — Telephone Encounter (Signed)
Dr Arlyce Dice asked that I contact patient with recommendations to hold lisinopril (Prinzide) x 1 week and then call us back with condition update. Patient verbalizes understanding and states that he will call back in 1 week to let us know how he is doing.

## 2012-03-26 NOTE — Assessment & Plan Note (Addendum)
I remain concerned that this could be a medication-effect.  There has been no significant improvement after discontinuing Celexa or Prilosec.  Recommendations #1 hold prinzide, then zetia if no improvement after discontinuing the former (I will check with Dr. Luan Pulling whether a substitute antihypertensive as necessary) #2 to consider CT of the head if no improvement after withholding all medications

## 2012-03-26 NOTE — Patient Instructions (Addendum)
Please follow up in 6 weeks.

## 2012-03-26 NOTE — Assessment & Plan Note (Signed)
Unclear etiology. Perhaps this is diet-related although there is no clear-cut correlation.  Recommendations #1 dietary history when symptomatic

## 2012-03-26 NOTE — Progress Notes (Signed)
History of Present Illness:  Mr. Edelman continues to complain of intermittent but severe bouts of nausea. This is despite discontinuing both Prilosec and Celexa. Upper endoscopy and gastric emptying scan were normal. His only other GI complaint is occasional episodes of excess flatus. Colonoscopy a couple of years ago apparently was normal.    Review of Systems: Pertinent positive and negative review of systems were noted in the above HPI section. All other review of systems were otherwise negative.    Current Medications, Allergies, Past Medical History, Past Surgical History, Family History and Social History were reviewed in [employer] [facility] electronic medical record  Vital signs were reviewed in today's medical record. Physical Exam: General: Well developed , well nourished, no acute distress

## 2012-04-03 ENCOUNTER — Telehealth: Payer: Self-pay | Admitting: Gastroenterology

## 2012-04-03 NOTE — Telephone Encounter (Signed)
Pt states he still had nausea when he held the prinzide. Per OV note if the symptoms persist pt should hold the Zetia. Instructed pt to hold the Zetia and call us back in a week with an update. Dr. Arlyce Dice aware.

## 2012-04-03 NOTE — Telephone Encounter (Signed)
ok 

## 2012-04-30 ENCOUNTER — Telehealth: Payer: Self-pay | Admitting: Gastroenterology

## 2012-04-30 NOTE — Telephone Encounter (Signed)
Pt states he is off all of his meds and is still having nausea, and dizziness. Pt states he just feels awful. Pt states Dr. Arlyce Dice told him that if the symptoms continued off of the medications that he would do some more tests. Pt scheduled to see Dr. Arlyce Dice Friday 05/04/12@1 :45pm. Will call pt if there is a cancellation for a sooner appt.

## 2012-05-04 ENCOUNTER — Ambulatory Visit: Payer: Medicare HMO | Admitting: Gastroenterology

## 2012-05-04 ENCOUNTER — Encounter: Payer: Self-pay | Admitting: *Deleted

## 2012-05-07 ENCOUNTER — Telehealth: Payer: Self-pay | Admitting: *Deleted

## 2012-05-07 ENCOUNTER — Ambulatory Visit (INDEPENDENT_AMBULATORY_CARE_PROVIDER_SITE_OTHER): Payer: Medicare HMO | Admitting: Physician Assistant

## 2012-05-07 ENCOUNTER — Encounter: Payer: Self-pay | Admitting: Physician Assistant

## 2012-05-07 VITALS — BP 146/88 | HR 84 | Ht 73.0 in | Wt 338.2 lb

## 2012-05-07 DIAGNOSIS — R42 Dizziness and giddiness: Secondary | ICD-10-CM

## 2012-05-07 DIAGNOSIS — R110 Nausea: Secondary | ICD-10-CM

## 2012-05-07 DIAGNOSIS — R51 Headache: Secondary | ICD-10-CM

## 2012-05-07 NOTE — Progress Notes (Signed)
Reviewed and agree with management. [place]wood D. Egan Berkheimer, M.D., FACG  

## 2012-05-07 NOTE — Telephone Encounter (Signed)
Patient has an appointment with [facility] on 05-08-2012 at 9:30am  Called patient and patient is aware of appointment

## 2012-05-07 NOTE — Progress Notes (Signed)
Subjective:    Patient ID: [name] [name], male    DOB: [birth year], 66 y.o.   MRN: [id number]  HPI [name] [name] is a pleasant 66 year old white male known to Dr. Arlyce Dice him a primary patient of Dr. Luan Pulling at [facility] internal medicine clinic Patient had been seen here on a couple of occasions this summer with complaints of nausea. Is nausea has been episodic in severe at times but not associated with vomiting or abdominal pain. Dr. Arlyce Dice did upper endoscopy which was negative and a gastric emptying scan which was normal. He had also suggested his nausea may be medication related and had asked him to stop his Prilosec and Celexa which had no effect on his nausea. He also had, off of his Prinzide and Zetia ,but he but says this did not change his symptoms either. Patient relates that he had onset of symptoms in [place] where he 2013 but did not seek medical care until April when he was able to get Medicare. He says his symptoms were somewhat progressive during those months but since then have not progressed any further. He says he has an episode 3 or 4 times monthly and that his symptoms usually last for at least 24 hours. On further questioning he usually has onset of nausea first and then lightheadedness and sometimes dizziness. He also typically has a headache which is right-sided in the area of his temple. He describes the headache is pounding or throbbing. Past whether he always has a headache with these episodes he says yes. Headaches or not necessarily severe and he has been taking over-the-counter anti-inflammatories as needed He does not seem to have any sound or lites sensitivity. He has been using Zofran for nausea and does find this helpful. He says he usually feels best sitting or lying down during these episodes. He denies any palpitations diaphoresis chest pain shortness of breath etc. with these episodes. On further questioning he does have fairly strong family history of migraines, and his mother and  also in several siblings.    Review of Systems  Constitutional: Negative.   Eyes: Negative.   Respiratory: Negative.   Gastrointestinal: Positive for nausea.  Genitourinary: Negative.   Musculoskeletal: Negative.   Skin: Negative.   Neurological: Positive for dizziness and headaches.  Hematological: Negative.   Psychiatric/Behavioral: Negative.    Outpatient Prescriptions Prior to Visit  Medication Sig Dispense Refill  . aspirin 81 MG EC tablet Take 81 mg by mouth daily.        [name] ezetimibe (ZETIA) 10 MG tablet Take 1 tablet (10 mg total) by mouth daily.  30 tablet  11  . lisinopril-hydrochlorothiazide (PRINZIDE,ZESTORETIC) 20-12.5 MG per tablet Take 1 tablet by mouth daily.  90 tablet  2  . Multiple Vitamins-Minerals (MULTIVITAMIN WITH MINERALS) tablet Take 1 tablet by mouth daily.      . psyllium (METAMUCIL) 58.6 % powder Take 1 packet by mouth 3 (three) times daily.       Allergies  Allergen Reactions  . Lodine (Etodolac) Nausea And Vomiting       Patient Active Problem List  Diagnosis  . COLON POLYP  . LIPOMA  . HYPERCHOLESTEROLEMIA  . OBESITY, NOS  . ERECTILE DYSFUNCTION  . TENSION HEADACHE  . DEPRESSIVE DISORDER, NOS  . RESTLESS LEGS SYNDROME  . HYPERTENSION, BENIGN ESSENTIAL  . GASTROESOPHAGEAL REFLUX, NO ESOPHAGITIS  . HERNIA, UMBILICAL  . HAND PAIN, BILATERAL  . Cough  . Nausea  . Lateral epicondylitis of right elbow  . Excessive gas  History   Social History  . Marital Status: Widowed    Spouse Name: N/A    Number of Children: N/A  . Years of Education: N/A   Occupational History  . Not on file.   Social History Main Topics  . Smoking status: Never Smoker   . Smokeless tobacco: Never Used  . Alcohol Use: No  . Drug Use: No  . Sexually Active: Not on file   Other Topics Concern  . Not on file   Social History Narrative  . No narrative on file    Objective:   Physical Exam well-developed obese white male in no acute distress,  pleasant. Blood pressure 146/88 pulse 84 height 6 foot 1 wait 338. Discussion only today,no further exam        Assessment & Plan:  #65 66 year old male with 10 month history of episodic nausea occurring 3-4 times monthly and lasting at least 24 hours. Each episode associated with lightheadedness dizziness and right-sided headache. I do not think he has a primary GI etiology for his nausea, suspect she may be having migraines. Cannot rule out other  central etiology.  Plan; patient will resume his Prinzide , and Zetia. He has been off the Celexa for some time so will leave that off for now. Will refer to neurology for further workup, and deferred to neurology regarding imaging Continue Zofran as needed Patient was given a couple of samples of Relpax 40 mg tablets and asked to take one half tablet when he has another episode to see if this will be of benefit.

## 2012-05-07 NOTE — Patient Instructions (Addendum)
Per Mike Gip, PAC please start taking Prinizide and Zetia as directed   I will make an appointment with [facility] and call you back with the appointment information

## 2012-05-17 ENCOUNTER — Ambulatory Visit: Payer: Medicare HMO | Admitting: Gastroenterology

## 2012-07-02 ENCOUNTER — Ambulatory Visit (INDEPENDENT_AMBULATORY_CARE_PROVIDER_SITE_OTHER): Payer: Medicare HMO | Admitting: Family Medicine

## 2012-07-02 ENCOUNTER — Encounter: Payer: Self-pay | Admitting: Family Medicine

## 2012-07-02 ENCOUNTER — Telehealth: Payer: Self-pay | Admitting: Gastroenterology

## 2012-07-02 VITALS — BP 120/78 | HR 78 | Temp 98.5°F | Ht 73.0 in | Wt 336.0 lb

## 2012-07-02 DIAGNOSIS — R109 Unspecified abdominal pain: Secondary | ICD-10-CM

## 2012-07-02 NOTE — Progress Notes (Signed)
  Subjective:    Patient ID: [name] [name], male    DOB: [birth year], 66 y.o.   MRN: [id number]  HPI Abdomen pain Sudden onset of lower abdomen pain associated with nausea and diarrhea and one episode of bloody diarrhea.  Lasted 24hours.  He took Konjac Root diet supplement the day before symptoms started.  Has not had any symptoms since and has not take the Root.  No fever or vomiting or weight loss or lightheadness or fatigue  Review of Symptoms - see HPI  PMH - Smoking status noted.  Has seen GI Arlyce Dice) and Neurologist at [facility] for chronic episodes of nausea and then headache.  Currenlty being treated for migriaines. Unsure when last had colonoscopy    Review of Systems     Objective:   Physical Exam Obese no acute distress Abdomen: soft and non-tender without masses, organomegaly or hernias noted.  No guarding or rebound         Assessment & Plan:

## 2012-07-02 NOTE — Assessment & Plan Note (Signed)
Transient episode likely related to taking Konjac Root.  Given he had some GI bleeding will need follow up evaluation likely colonoscopy.   No anemia symptoms and blood loss sounded minimum so no blood work today

## 2012-07-02 NOTE — Telephone Encounter (Signed)
Pt states that for the past several days he has had abdominal cramping and pain along with rectal bleeding. Requesting to be seen. Pt scheduled to see Dr. Arlyce Dice tomorrow at 9:15am. Pt aware of appt date and time.

## 2012-07-02 NOTE — Patient Instructions (Addendum)
See Dr Arlyce Dice to discuss to your rectal bleeding and need for a colonoscopy  If you have any recurrence of severe abdomen pain or bleeding call us immediately  Follow up with Dr Louanne Belton to discuss other approaches

## 2012-07-03 ENCOUNTER — Ambulatory Visit (INDEPENDENT_AMBULATORY_CARE_PROVIDER_SITE_OTHER): Payer: Medicare HMO | Admitting: Gastroenterology

## 2012-07-03 ENCOUNTER — Encounter: Payer: Self-pay | Admitting: Gastroenterology

## 2012-07-03 VITALS — BP 132/80 | HR 87 | Ht 73.0 in | Wt 336.2 lb

## 2012-07-03 DIAGNOSIS — R110 Nausea: Secondary | ICD-10-CM

## 2012-07-03 DIAGNOSIS — K625 Hemorrhage of anus and rectum: Secondary | ICD-10-CM

## 2012-07-03 DIAGNOSIS — E669 Obesity, unspecified: Secondary | ICD-10-CM

## 2012-07-03 NOTE — Patient Instructions (Addendum)
Go to the basement and pick up your hemoccult kit today Please return within 2 weeks

## 2012-07-03 NOTE — Assessment & Plan Note (Signed)
She has tried weight loss diets and exercise without success. I suggested he consider a nutrition consult were trying [employer]

## 2012-07-03 NOTE — Assessment & Plan Note (Signed)
Limited rectal bleeding could have been do to hemorrhoids. Ischemic colitis is also a consideration in view of his abdominal pain. In any event, symptoms have entirely subsided.  Recommendations #1 followup Hemoccults; if negative no further workup at this time

## 2012-07-03 NOTE — Assessment & Plan Note (Signed)
His no longer is an active problem

## 2012-07-03 NOTE — Progress Notes (Signed)
History of Present Illness: Patient has returned for evaluation of abdominal pain and rectal bleeding. Last week he took several " weight loss pills" at which point he developed severe crampy lower abdominal pain and diarrhea. On several occasions he saw a small amounts of blood mixed with the stools. He immediately discontinued the supplements and over the course of 2-3 days symptoms subsided. He's had no further bleeding, diarrhea or pain. Last colonoscopy was approximately 2 years ago. Currently he is feeling well. He has occasional bouts of nausea.    Past Medical History  Diagnosis Date  . GERD (gastroesophageal reflux disease)   . Depression   . Hypertension   . Arthritis   . 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 Evette Cristal   . Diverticula of colon 03-10-2004    Dr. Evette Cristal   . Hyperlipemia    Past Surgical History  Procedure Date  . Appendectomy   . Fracture surgery   . Shoulder arthroscopy   . Leg surgery    family history includes Cancer in his mother; Emphysema in his father; Heart disease in his mother; and Lung cancer in his mother. Current Outpatient Prescriptions  Medication Sig Dispense Refill  . aspirin 81 MG EC tablet Take 81 mg by mouth daily.        [name] ezetimibe (ZETIA) 10 MG tablet Take 10 mg by mouth daily.      [name] lisinopril-hydrochlorothiazide (PRINZIDE,ZESTORETIC) 20-12.5 MG per tablet Take 1 tablet by mouth daily.  90 tablet  2  . Multiple Vitamins-Minerals (MULTIVITAMIN WITH MINERALS) tablet Take 1 tablet by mouth daily.      . psyllium (METAMUCIL) 58.6 % powder Take 1 packet by mouth 3 (three) times daily.      . temazepam (RESTORIL) 15 MG capsule Take 15 mg by mouth at bedtime as needed.      . [DISCONTINUED] ezetimibe (ZETIA) 10 MG tablet Take 1 tablet (10 mg total) by mouth daily.  30 tablet  11   Allergies as of 07/03/2012 - Review Complete 07/03/2012  Allergen Reaction Noted  . Lodine (etodolac) Nausea And Vomiting 09/27/2010    reports  that he has never smoked. He has never used smokeless tobacco. He reports that he does not drink alcohol or use illicit drugs.     Review of Systems: Pertinent positive and negative review of systems were noted in the above HPI section. All other review of systems were otherwise negative.  Vital signs were reviewed in today's medical record Physical Exam: General: Well developed , well nourished, no acute distress

## 2012-07-13 ENCOUNTER — Other Ambulatory Visit: Payer: Self-pay | Admitting: *Deleted

## 2012-07-13 MED ORDER — EZETIMIBE 10 MG PO TABS: 10.0000 mg | ORAL_TABLET | Freq: Every day | ORAL | Status: DC

## 2012-09-26 ENCOUNTER — Ambulatory Visit (INDEPENDENT_AMBULATORY_CARE_PROVIDER_SITE_OTHER): Payer: Medicare HMO | Admitting: Family Medicine

## 2012-09-26 ENCOUNTER — Encounter: Payer: Self-pay | Admitting: Family Medicine

## 2012-09-26 VITALS — BP 123/75 | HR 67 | Temp 98.2°F | Ht 73.0 in | Wt 318.7 lb

## 2012-09-26 DIAGNOSIS — L989 Disorder of the skin and subcutaneous tissue, unspecified: Secondary | ICD-10-CM

## 2012-09-26 DIAGNOSIS — C44621 Squamous cell carcinoma of skin of unspecified upper limb, including shoulder: Secondary | ICD-10-CM

## 2012-09-26 DIAGNOSIS — F329 Major depressive disorder, single episode, unspecified: Secondary | ICD-10-CM

## 2012-09-26 MED ORDER — HYDROCODONE-ACETAMINOPHEN 10-325 MG PO TABS: 1.0000 | ORAL_TABLET | Freq: Three times a day (TID) | ORAL | Status: DC | PRN

## 2012-09-26 MED ORDER — SERTRALINE HCL 100 MG PO TABS: 100.0000 mg | ORAL_TABLET | Freq: Every day | ORAL | Status: DC

## 2012-09-26 NOTE — Patient Instructions (Signed)
It was great to see you today! I am giving you some pain medication in case your arms hurts a lot. Come back in 7 days to have your sutures removed. Stop taking your Celexa and start taking Zoloft.  Initially start with 1/2 pill then after 1 week, go up to one pill. Come back to see me in about a month.

## 2012-09-28 ENCOUNTER — Telehealth: Payer: Self-pay | Admitting: *Deleted

## 2012-09-28 NOTE — Telephone Encounter (Signed)
Informed [facility] Pathology that biopsy site is right elbow per Dr. Louanne Belton.  Gaylene Brooks, RN

## 2012-09-28 NOTE — Telephone Encounter (Signed)
Patient had biopsy yesterday.  [facility] needs to verify biopsy site and which site to list on the report.  Requisition states site is right arm, but biopsy bottle says right elbow.   Will route note to Dr. Louanne Belton and call pathology back.  Gaylene Brooks, RN

## 2012-10-01 ENCOUNTER — Telehealth: Payer: Self-pay | Admitting: Family Medicine

## 2012-10-01 ENCOUNTER — Ambulatory Visit (INDEPENDENT_AMBULATORY_CARE_PROVIDER_SITE_OTHER): Payer: Medicare HMO | Admitting: Family Medicine

## 2012-10-01 ENCOUNTER — Encounter: Payer: Self-pay | Admitting: Family Medicine

## 2012-10-01 VITALS — BP 123/75 | HR 68 | Temp 98.2°F | Ht 73.0 in | Wt 317.1 lb

## 2012-10-01 DIAGNOSIS — T8140XA Infection following a procedure, unspecified, initial encounter: Secondary | ICD-10-CM

## 2012-10-01 DIAGNOSIS — L0390 Cellulitis, unspecified: Secondary | ICD-10-CM

## 2012-10-01 DIAGNOSIS — L0291 Cutaneous abscess, unspecified: Secondary | ICD-10-CM

## 2012-10-01 MED ORDER — CEPHALEXIN 500 MG PO CAPS: 500.0000 mg | ORAL_CAPSULE | Freq: Two times a day (BID) | ORAL | Status: DC

## 2012-10-01 NOTE — Assessment & Plan Note (Signed)
Cellulitis

## 2012-10-01 NOTE — Telephone Encounter (Signed)
Pt had mole removed on his arm right about the elbow - and it is red and sore and isn't sure if he needs abx -

## 2012-10-01 NOTE — Assessment & Plan Note (Signed)
Change to zoloft.  Will plan to attempt titration to higher dose and see if tolerates.

## 2012-10-01 NOTE — Patient Instructions (Addendum)
You have a skin infection. Please take Keflex 500mg  twice a day for 5 days Take this medicine until it is completely gone even if you are feeling better. Please return to clinic on Thursday for evaluation of your arm   Cellulitis Cellulitis is an infection of the skin and the tissue beneath it. The infected area is usually red and tender. Cellulitis occurs most often in the arms and lower legs.  CAUSES  Cellulitis is caused by bacteria that enter the skin through cracks or cuts in the skin. The most common types of bacteria that cause cellulitis are Staphylococcus and Streptococcus. SYMPTOMS   Redness and warmth.  Swelling.  Tenderness or pain.  Fever. DIAGNOSIS  Your caregiver can usually determine what is wrong based on a physical exam. Blood tests may also be done. TREATMENT  Treatment usually involves taking an antibiotic medicine. HOME CARE INSTRUCTIONS   Take your antibiotics as directed. Finish them even if you start to feel better.  Keep the infected arm or leg elevated to reduce swelling.  Apply a warm cloth to the affected area up to 4 times per day to relieve pain.  Only take over-the-counter or prescription medicines for pain, discomfort, or fever as directed by your caregiver.  Keep all follow-up appointments as directed by your caregiver. SEEK MEDICAL CARE IF:   You notice red streaks coming from the infected area.  Your red area gets larger or turns dark in color.  Your bone or joint underneath the infected area becomes painful after the skin has healed.  Your infection returns in the same area or another area.  You notice a swollen bump in the infected area.  You develop new symptoms. SEEK IMMEDIATE MEDICAL CARE IF:   You have a fever.  You feel very sleepy.  You develop vomiting or diarrhea.  You have a general ill feeling (malaise) with muscle aches and pains. MAKE SURE YOU:   Understand these instructions.  Will watch your  condition.  Will get help right away if you are not doing well or get worse. Document Released: 04/27/2005 Document Revised: 01/17/2012 Document Reviewed: 10/03/2011 [facility] Patient Information 2013 [place], [place].

## 2012-10-01 NOTE — Progress Notes (Signed)
Patient ID: [name] [name], male   DOB: July 08, 1946, 67 y.o.   MRN: [id number] Subjective: The patient is a 67 y.o. year old male who presents today for depression.  Patient has been on celexa for about a year now.  Still noticing some problems with depressed mood and with lack of interest in things he used to enjoy.  No SI/HI.  He has previously been on higher dose of celexa but had probloems with GI side effect.  PHQ-9 today is 5  But is causing problems with life.  Patient also notes a lesion on his right arm (in the lateral elbow region) that is raised, rough, and has been expanding in size over the last number of months.  Patient's past medical, social, and family history were reviewed and updated as appropriate. History  Substance Use Topics  . Smoking status: Never Smoker   . Smokeless tobacco: Never Used  . Alcohol Use: No   Objective:  Filed Vitals:   09/26/12 1522  BP: 123/75  Pulse: 67  Temp: 98.2 F (36.8 C)   Gen: NAD, morbidly obese Ext: Right arm has 8mm raised lesion on the  Lateral elbow.  No color change but is rugated.  Procedure note:  Consent obtained and time out performed.  Area around lesion of right arm was prepped and draped in sterile fashion.  Skin was numbed with 2% lidocain with epi.  Lesion was removed by wide local excision in an elipitical fashion.  Closure was performed with simple interrupted stitches (#8) using 3-0 vicril.  Hemostasis achieved.  Patient tolerated well.  Assessment/Plan: Skin lesion: to pathology.  Likely benign, but uncertain.  Please also see individual problems in problem list for problem-specific plans.

## 2012-10-01 NOTE — Telephone Encounter (Signed)
  Patient states area is red about 1.5 inchs  from center. Tender to touch. Appointment scheduled to come in today to evaluate.  Mole removal 02/26.

## 2012-10-01 NOTE — Progress Notes (Signed)
Wilkin Lippy is a 67 y.o. male who presents to [facility] today for skin check.  Excision of mole on Wednesday. Become red and swollen adn painful on Saturday. worsenign every day. Denies fever, rash, n/v/d. Denies any purulent or bloody discharge.    The following portions of the patient's history were reviewed and updated as appropriate: allergies, current medications, past medical history, family and social history, and problem list.  Patient is a nonsmoker  Past Medical History  Diagnosis Date  . GERD (gastroesophageal reflux disease)   . Depression   . Hypertension   . Arthritis   . 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 Evette Cristal   . Diverticula of colon 03-10-2004    Dr. Evette Cristal   . Hyperlipemia     ROS as above otherwise neg.    Medications reviewed. Current Outpatient Prescriptions  Medication Sig Dispense Refill  . aspirin 81 MG EC tablet Take 81 mg by mouth daily.        [name] ezetimibe (ZETIA) 10 MG tablet Take 1 tablet (10 mg total) by mouth daily.  90 tablet  3  . HYDROcodone-acetaminophen (NORCO) 10-325 MG per tablet Take 1 tablet by mouth every 8 (eight) hours as needed for pain.  30 tablet  0  . lisinopril-hydrochlorothiazide (PRINZIDE,ZESTORETIC) 20-12.5 MG per tablet Take 1 tablet by mouth daily.  90 tablet  2  . Multiple Vitamins-Minerals (MULTIVITAMIN WITH MINERALS) tablet Take 1 tablet by mouth daily.      . psyllium (METAMUCIL) 58.6 % powder Take 1 packet by mouth 3 (three) times daily.      . sertraline (ZOLOFT) 100 MG tablet Take 1 tablet (100 mg total) by mouth daily.  30 tablet  3  . tamsulosin (FLOMAX) 0.4 MG CAPS       . temazepam (RESTORIL) 15 MG capsule Take 15 mg by mouth at bedtime as needed.       No current facility-administered medications for this visit.    Exam: BP 123/75  Pulse 68  Temp(Src) 98.2 F (36.8 C) (Oral)  Ht 6\' 1"  (1.854 m)  Wt 317 lb 1 oz (143.819 kg)  BMI 41.84 kg/m2 Gen: Well NAD HEENT: EOMI,  MMM Lungs:  Nl  WOB SKin: Marked erythema and induration around incision site on R forearm. Stitches in place. Ttp. No rash Neuro: mentation nml, CN grossly intact, cerebellar fxn nml Psych: appropriate Msk: No joint effusion or pain. FROM of R elbow.    No results found for this or any previous visit (from the past 72 hour(s)).   Spent greater than in direct pt care.

## 2012-10-02 ENCOUNTER — Encounter: Payer: Self-pay | Admitting: Family Medicine

## 2012-10-02 DIAGNOSIS — T8149XA Infection following a procedure, other surgical site, initial encounter: Secondary | ICD-10-CM | POA: Insufficient documentation

## 2012-10-02 NOTE — Assessment & Plan Note (Signed)
Area marked and dated w/ skin pen  Sutures removed w/ purulent discharge from 3 suture sites.  Cx sent Area cleaned and Steristrips applied Did not feel that wound needed to be open at this time as no apparent large abscess felt. Keflex BID for 5 days Reevaluation in 3 days.  VS stable no evidence of sepsis at this time Precautions given

## 2012-10-03 LAB — WOUND CULTURE
Gram Stain: NONE SEEN
Organism ID, Bacteria: NO GROWTH

## 2012-10-04 ENCOUNTER — Ambulatory Visit (INDEPENDENT_AMBULATORY_CARE_PROVIDER_SITE_OTHER): Payer: Medicare HMO | Admitting: Family Medicine

## 2012-10-04 ENCOUNTER — Encounter: Payer: Self-pay | Admitting: Family Medicine

## 2012-10-04 VITALS — BP 109/69 | HR 74 | Temp 98.2°F | Ht 73.0 in | Wt 316.0 lb

## 2012-10-04 DIAGNOSIS — Z5189 Encounter for other specified aftercare: Secondary | ICD-10-CM

## 2012-10-04 MED ORDER — MUPIROCIN 2 % EX OINT: TOPICAL_OINTMENT | Freq: Two times a day (BID) | CUTANEOUS | Status: DC

## 2012-10-04 NOTE — Progress Notes (Signed)
[name] [name] is a 67 y.o. male who presents to [facility] today for wound f/u.  Significant improvement in wound pain. Pt just started working at [employer] Wound opened this am w/ purulent discharge. No joint pain or decreased ROM. Denies fevers, malaise.    The following portions of the patient's history were reviewed and updated as appropriate: allergies, current medications, past medical history, family and social history, and problem list.  Patient is a nonsmoker  Past Medical History  Diagnosis Date  . GERD (gastroesophageal reflux disease)   . Depression   . Hypertension   . Arthritis   . 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 Evette Cristal   . Diverticula of colon 03-10-2004    Dr. Evette Cristal   . Hyperlipemia     ROS as above otherwise neg.    Medications reviewed. Current Outpatient Prescriptions  Medication Sig Dispense Refill  . aspirin 81 MG EC tablet Take 81 mg by mouth daily.        . cephALEXin (KEFLEX) 500 MG capsule Take 1 capsule (500 mg total) by mouth 2 (two) times daily.  10 capsule  0  . ezetimibe (ZETIA) 10 MG tablet Take 1 tablet (10 mg total) by mouth daily.  90 tablet  3  . HYDROcodone-acetaminophen (NORCO) 10-325 MG per tablet Take 1 tablet by mouth every 8 (eight) hours as needed for pain.  30 tablet  0  . lisinopril-hydrochlorothiazide (PRINZIDE,ZESTORETIC) 20-12.5 MG per tablet Take 1 tablet by mouth daily.  90 tablet  2  . Multiple Vitamins-Minerals (MULTIVITAMIN WITH MINERALS) tablet Take 1 tablet by mouth daily.      . mupirocin ointment (BACTROBAN) 2 % Apply topically 2 (two) times daily. Apply for 5 days  22 g  0  . psyllium (METAMUCIL) 58.6 % powder Take 1 packet by mouth 3 (three) times daily.      . sertraline (ZOLOFT) 100 MG tablet Take 1 tablet (100 mg total) by mouth daily.  30 tablet  3  . tamsulosin (FLOMAX) 0.4 MG CAPS       . temazepam (RESTORIL) 15 MG capsule Take 15 mg by mouth at bedtime as needed.       No  current facility-administered medications for this visit.    Exam: BP 109/69  Pulse 74  Temp(Src) 98.2 F (36.8 C) (Oral)  Ht 6\' 1"  (1.854 m)  Wt 316 lb (143.337 kg)  BMI 41.7 kg/m2 Gen: Well NAD HEENT: EOMI,  MMM MSK: R arm wound erythema significantly reduced. Minimal around wound edges. Wound dehiscence w/ small 1cm wound pocket below the skin edges. Granulation tissue present around the wound edges.   No results found for this or any previous visit (from the past 72 hour(s)).  Wound cleaned w/ hydrogen peroxide and NS. No purulent discharge from wound. Well healing    Reviewed path report w/ pt

## 2012-10-04 NOTE — Assessment & Plan Note (Signed)
Significant improvmeent Dehiscence of the incision Small underlying pocket Anticipate excellent wound healigng by secondary intenction Continue Keflex Mupirocine ointment until granulation tissue covers wound F/u w/ Dr Louanne Belton in 3 wks Reviewed path reprot Wound Cx no growth to date

## 2012-10-04 NOTE — Patient Instructions (Signed)
You are doing well The wound is healing well Continue to put on the mupirocin ointment daily Continue taking your antibiotics until they are gone Follow up w/ Dr Louanne Belton in 3 weeks This will heal slowly.

## 2012-10-08 ENCOUNTER — Telehealth: Payer: Self-pay | Admitting: Family Medicine

## 2012-10-08 NOTE — Telephone Encounter (Signed)
Pt called.  Reports arm is doing better.  No fevers/chills, minimal pain.  No more drainage.  Patient informed of results (SCC, margins clear) and need to see derm for total body exam.  Pt has seen derm before and will contact us with who so I can send path report.

## 2012-10-24 ENCOUNTER — Observation Stay (HOSPITAL_COMMUNITY)
Admission: EM | Admit: 2012-10-24 | Discharge: 2012-10-25 | Disposition: A | Discharge status: Home / Self Care | Payer: Medicare HMO | Attending: Internal Medicine | Admitting: Internal Medicine

## 2012-10-24 ENCOUNTER — Encounter (HOSPITAL_COMMUNITY): Payer: Self-pay

## 2012-10-24 ENCOUNTER — Emergency Department (HOSPITAL_COMMUNITY): Payer: Medicare HMO

## 2012-10-24 DIAGNOSIS — F329 Major depressive disorder, single episode, unspecified: Secondary | ICD-10-CM | POA: Insufficient documentation

## 2012-10-24 DIAGNOSIS — K429 Umbilical hernia without obstruction or gangrene: Secondary | ICD-10-CM

## 2012-10-24 DIAGNOSIS — I10 Essential (primary) hypertension: Secondary | ICD-10-CM | POA: Insufficient documentation

## 2012-10-24 DIAGNOSIS — K219 Gastro-esophageal reflux disease without esophagitis: Secondary | ICD-10-CM | POA: Insufficient documentation

## 2012-10-24 DIAGNOSIS — F3289 Other specified depressive episodes: Secondary | ICD-10-CM | POA: Insufficient documentation

## 2012-10-24 DIAGNOSIS — E785 Hyperlipidemia, unspecified: Secondary | ICD-10-CM | POA: Diagnosis present

## 2012-10-24 DIAGNOSIS — E669 Obesity, unspecified: Secondary | ICD-10-CM | POA: Insufficient documentation

## 2012-10-24 DIAGNOSIS — R059 Cough, unspecified: Secondary | ICD-10-CM | POA: Insufficient documentation

## 2012-10-24 DIAGNOSIS — R05 Cough: Secondary | ICD-10-CM | POA: Diagnosis present

## 2012-10-24 DIAGNOSIS — G2589 Other specified extrapyramidal and movement disorders: Secondary | ICD-10-CM

## 2012-10-24 DIAGNOSIS — Z8701 Personal history of pneumonia (recurrent): Secondary | ICD-10-CM | POA: Diagnosis present

## 2012-10-24 DIAGNOSIS — D126 Benign neoplasm of colon, unspecified: Secondary | ICD-10-CM

## 2012-10-24 DIAGNOSIS — R0989 Other specified symptoms and signs involving the circulatory and respiratory systems: Principal | ICD-10-CM | POA: Insufficient documentation

## 2012-10-24 DIAGNOSIS — F5232 Male orgasmic disorder: Secondary | ICD-10-CM

## 2012-10-24 DIAGNOSIS — E7800 Pure hypercholesterolemia, unspecified: Secondary | ICD-10-CM

## 2012-10-24 DIAGNOSIS — R0600 Dyspnea, unspecified: Secondary | ICD-10-CM

## 2012-10-24 DIAGNOSIS — R0609 Other forms of dyspnea: Principal | ICD-10-CM | POA: Insufficient documentation

## 2012-10-24 DIAGNOSIS — G44209 Tension-type headache, unspecified, not intractable: Secondary | ICD-10-CM

## 2012-10-24 DIAGNOSIS — D179 Benign lipomatous neoplasm, unspecified: Secondary | ICD-10-CM

## 2012-10-24 LAB — CBC WITH DIFFERENTIAL/PLATELET
Basophils Relative: 0 % (ref 0–1)
Eosinophils Absolute: 0.4 10*3/uL (ref 0.0–0.7)
HCT: 45.7 % (ref 39.0–52.0)
Hemoglobin: 16 g/dL (ref 13.0–17.0)
Lymphs Abs: 0.8 10*3/uL (ref 0.7–4.0)
MCH: 28.7 pg (ref 26.0–34.0)
MCHC: 35 g/dL (ref 30.0–36.0)
MCV: 81.9 fL (ref 78.0–100.0)
Monocytes Absolute: 0.5 10*3/uL (ref 0.1–1.0)
Monocytes Relative: 7 % (ref 3–12)
RBC: 5.58 MIL/uL (ref 4.22–5.81)

## 2012-10-24 LAB — BASIC METABOLIC PANEL
BUN: 23 mg/dL (ref 6–23)
Creatinine, Ser: 1 mg/dL (ref 0.50–1.35)
GFR calc Af Amer: 89 mL/min — ABNORMAL LOW (ref >90)
GFR calc non Af Amer: 77 mL/min — ABNORMAL LOW (ref >90)
Glucose, Bld: 121 mg/dL — ABNORMAL HIGH (ref 70–99)

## 2012-10-24 LAB — TROPONIN I
Troponin I: <0.3 ng/mL (ref <0.30)
Troponin I: <0.3 ng/mL (ref <0.30)

## 2012-10-24 MED ORDER — HYDROCODONE-ACETAMINOPHEN 5-325 MG PO TABS
1.0000 | ORAL_TABLET | ORAL | Status: DC | PRN
  Administered 2012-10-25: 1 via ORAL
  Filled 2012-10-24: qty 1

## 2012-10-24 MED ORDER — MORPHINE SULFATE 4 MG/ML IJ SOLN
6.0000 mg | Freq: Once | INTRAMUSCULAR | Status: AC
  Administered 2012-10-24: 6 mg via INTRAVENOUS
  Filled 2012-10-24: qty 1

## 2012-10-24 MED ORDER — ACETAMINOPHEN 650 MG RE SUPP: 650.0000 mg | Freq: Four times a day (QID) | RECTAL | Status: DC | PRN

## 2012-10-24 MED ORDER — TRAZODONE HCL 50 MG PO TABS: 50.0000 mg | ORAL_TABLET | Freq: Every evening | ORAL | Status: DC | PRN

## 2012-10-24 MED ORDER — MORPHINE SULFATE 2 MG/ML IJ SOLN
INTRAMUSCULAR | Status: AC
  Filled 2012-10-24: qty 1

## 2012-10-24 MED ORDER — ONDANSETRON HCL 4 MG/2ML IJ SOLN: 4.0000 mg | Freq: Four times a day (QID) | INTRAMUSCULAR | Status: DC | PRN

## 2012-10-24 MED ORDER — PSYLLIUM 95 % PO PACK
1.0000 | PACK | Freq: Every day | ORAL | Status: DC
  Administered 2012-10-25: 1 via ORAL
  Filled 2012-10-24 (×3): qty 1

## 2012-10-24 MED ORDER — ALUM & MAG HYDROXIDE-SIMETH 200-200-20 MG/5ML PO SUSP: 30.0000 mL | Freq: Four times a day (QID) | ORAL | Status: DC | PRN

## 2012-10-24 MED ORDER — ALPRAZOLAM 0.5 MG PO TABS
0.5000 mg | ORAL_TABLET | Freq: Three times a day (TID) | ORAL | Status: DC | PRN
  Administered 2012-10-24 – 2012-10-25 (×2): 0.5 mg via ORAL
  Filled 2012-10-24 (×2): qty 1

## 2012-10-24 MED ORDER — ASPIRIN EC 81 MG PO TBEC
81.0000 mg | DELAYED_RELEASE_TABLET | Freq: Every day | ORAL | Status: DC
  Administered 2012-10-25: 81 mg via ORAL
  Filled 2012-10-24: qty 1

## 2012-10-24 MED ORDER — ENOXAPARIN SODIUM 40 MG/0.4ML ~~LOC~~ SOLN
40.0000 mg | SUBCUTANEOUS | Status: DC
  Administered 2012-10-24: 40 mg via SUBCUTANEOUS
  Filled 2012-10-24: qty 0.4

## 2012-10-24 MED ORDER — SERTRALINE HCL 50 MG PO TABS
100.0000 mg | ORAL_TABLET | Freq: Every day | ORAL | Status: DC
  Administered 2012-10-25: 100 mg via ORAL
  Filled 2012-10-24: qty 2

## 2012-10-24 MED ORDER — ALBUTEROL SULFATE (5 MG/ML) 0.5% IN NEBU
2.5000 mg | INHALATION_SOLUTION | Freq: Four times a day (QID) | RESPIRATORY_TRACT | Status: DC
  Administered 2012-10-25 (×2): 2.5 mg via RESPIRATORY_TRACT
  Filled 2012-10-24 (×2): qty 0.5

## 2012-10-24 MED ORDER — GUAIFENESIN-DM 100-10 MG/5ML PO SYRP
5.0000 mL | ORAL_SOLUTION | ORAL | Status: DC | PRN
  Administered 2012-10-25: 5 mL via ORAL
  Filled 2012-10-24: qty 5

## 2012-10-24 MED ORDER — SODIUM CHLORIDE 0.9 % IJ SOLN
3.0000 mL | Freq: Two times a day (BID) | INTRAMUSCULAR | Status: DC
  Administered 2012-10-24 – 2012-10-25 (×2): 3 mL via INTRAVENOUS

## 2012-10-24 MED ORDER — ALBUTEROL SULFATE (5 MG/ML) 0.5% IN NEBU
5.0000 mg | INHALATION_SOLUTION | Freq: Once | RESPIRATORY_TRACT | Status: AC
  Administered 2012-10-24: 5 mg via RESPIRATORY_TRACT
  Filled 2012-10-24: qty 1

## 2012-10-24 MED ORDER — ALBUTEROL SULFATE (5 MG/ML) 0.5% IN NEBU
2.5000 mg | INHALATION_SOLUTION | Freq: Four times a day (QID) | RESPIRATORY_TRACT | Status: DC
  Administered 2012-10-24: 2.5 mg via RESPIRATORY_TRACT
  Filled 2012-10-24: qty 0.5

## 2012-10-24 MED ORDER — ALBUTEROL SULFATE (5 MG/ML) 0.5% IN NEBU: 5.0000 mg | INHALATION_SOLUTION | Freq: Once | RESPIRATORY_TRACT | Status: DC

## 2012-10-24 MED ORDER — SODIUM CHLORIDE 0.9 % IV SOLN
INTRAVENOUS | Status: DC
  Administered 2012-10-24: 10:00:00 via INTRAVENOUS

## 2012-10-24 MED ORDER — IPRATROPIUM BROMIDE 0.02 % IN SOLN
0.5000 mg | Freq: Once | RESPIRATORY_TRACT | Status: AC
  Administered 2012-10-24: 0.5 mg via RESPIRATORY_TRACT
  Filled 2012-10-24: qty 2.5

## 2012-10-24 MED ORDER — DEXAMETHASONE SODIUM PHOSPHATE 4 MG/ML IJ SOLN
10.0000 mg | Freq: Once | INTRAMUSCULAR | Status: AC
  Administered 2012-10-24: 10 mg via INTRAVENOUS
  Filled 2012-10-24: qty 1

## 2012-10-24 MED ORDER — ONDANSETRON HCL 4 MG PO TABS: 4.0000 mg | ORAL_TABLET | Freq: Four times a day (QID) | ORAL | Status: DC | PRN

## 2012-10-24 MED ORDER — EZETIMIBE 10 MG PO TABS
10.0000 mg | ORAL_TABLET | Freq: Every day | ORAL | Status: DC
  Administered 2012-10-25: 10 mg via ORAL
  Filled 2012-10-24: qty 1

## 2012-10-24 MED ORDER — ACETAMINOPHEN 325 MG PO TABS: 650.0000 mg | ORAL_TABLET | Freq: Four times a day (QID) | ORAL | Status: DC | PRN

## 2012-10-24 MED ORDER — ONDANSETRON HCL 4 MG/2ML IJ SOLN
INTRAMUSCULAR | Status: AC
  Filled 2012-10-24: qty 2

## 2012-10-24 MED ORDER — ALBUTEROL SULFATE (5 MG/ML) 0.5% IN NEBU: 2.5000 mg | INHALATION_SOLUTION | RESPIRATORY_TRACT | Status: DC | PRN

## 2012-10-24 MED ORDER — BIOTENE DRY MOUTH MT LIQD
15.0000 mL | Freq: Two times a day (BID) | OROMUCOSAL | Status: DC
  Administered 2012-10-24 – 2012-10-25 (×2): 15 mL via OROMUCOSAL

## 2012-10-24 MED ORDER — KETOROLAC TROMETHAMINE 30 MG/ML IJ SOLN
30.0000 mg | Freq: Once | INTRAMUSCULAR | Status: AC
  Administered 2012-10-24: 30 mg via INTRAVENOUS
  Filled 2012-10-24: qty 1

## 2012-10-24 MED ORDER — IOHEXOL 350 MG/ML SOLN
80.0000 mL | Freq: Once | INTRAVENOUS | Status: AC | PRN
  Administered 2012-10-24: 80 mL via INTRAVENOUS

## 2012-10-24 MED ORDER — IOHEXOL 350 MG/ML SOLN
100.0000 mL | Freq: Once | INTRAVENOUS | Status: AC | PRN
  Administered 2012-10-24: 100 mL via INTRAVENOUS

## 2012-10-24 MED ORDER — RACEPINEPHRINE HCL 2.25 % IN NEBU
0.5000 mL | INHALATION_SOLUTION | Freq: Once | RESPIRATORY_TRACT | Status: AC
  Administered 2012-10-24: 0.5 mL via RESPIRATORY_TRACT
  Filled 2012-10-24: qty 0.5

## 2012-10-24 MED ORDER — PREDNISONE 20 MG PO TABS
40.0000 mg | ORAL_TABLET | Freq: Every day | ORAL | Status: DC
  Administered 2012-10-25: 40 mg via ORAL
  Filled 2012-10-24: qty 2

## 2012-10-24 NOTE — H&P (Signed)
[facility] History and Physical  Marten Iles JXB:[id number] DOB: January 13, 1946 DOA: 10/24/2012  Referring physician:  PCP: Majel Homer, MD  Specialists:   Chief Complaint: dyspnea  HPI: [name] [name] is a 67 y.o. male pmhx of HTN, anxiety, depression, GERD, hyperlipidemia who presents to ED cc dyspnea. Information obtained from pt. States 2 days ago had a "tickle" in my throat and coughed intermittently. Yesterday awakened with "head cold". Took mucinex without relief. Last evening coughing worsened and persisted. He reports subjective fever with chills. He states he stayed awake most of night due to coughing. He reports coughing to point that his "sides hurt". This am he awakened with shortness of breath and "dry throat that i could not swallow or talk".Denies headache, visual disturbances, facial droop or slurred speech. No numbness/tingling of extremities. Reports that breathing became so difficult he had to call EMS. Until this am has been in usual state of health. 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 Symptoms came on gradually, have persisted and worsened. Nothing makes better or worse. Characterized as moderate. In ED chest xray with no cardiopulmonary abnormalities. CT angio chest without central or lobar PE. VSS sats >90% on room air and on 2L. SBP 98-105, afebrile no white count. TRH asked to admit  Of note pt on Nutrisystem diet since 1/14 and reports 33lb weight loss. In addition, has been worked up by GI and neuro for ongoing cyclical abdominal pain. States diagnosed stomach migraines and given medication but states has not started the medication  Review of Systems: The patient denies anorexia, vision loss, decreased hearing,  syncope,  peripheral edema, balance deficits, hemoptysis, abdominal pain, melena, hematochezia, severe indigestion/heartburn, hematuria, incontinence, genital sores, muscle weakness, suspicious skin lesions, transient blindness,  difficulty walking, unusual weight change, abnormal bleeding, enlarged lymph nodes, angioedema, and breast masses.    Past Medical History  Diagnosis Date  . GERD (gastroesophageal reflux disease)   . Depression   . Hypertension   . Arthritis   . 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 Evette Cristal   . Diverticula of colon 03-10-2004    Dr. Evette Cristal   . Hyperlipemia    Past Surgical History  Procedure Laterality Date  . Appendectomy    . Fracture surgery    . Shoulder arthroscopy    . Leg surgery    . Arm surgery     Social History:  reports that he has never smoked. He has never used smokeless tobacco. He reports that he does not drink alcohol or use illicit drugs. Lives alone in [place] wife died 3 years ago. 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 Independent ADL's Allergies  Allergen Reactions  . Lodine (Etodolac) Nausea And Vomiting and Other (See Comments)    Internal Bleeding.     Family History  Problem Relation Age of Onset  . Emphysema Father     copd  . Heart disease Mother   . Cancer Mother     mastoid ear  . Lung cancer Mother    4 siblings who collective pmhx includes HTN and blood clots. No cancer, MI, stroke  Prior to Admission medications   Medication Sig Start Date End Date Taking? Authorizing Provider  aspirin 81 MG EC tablet Take 81 mg by mouth daily.     Yes Historical Provider, MD  ezetimibe (ZETIA) 10 MG tablet Take 1 tablet (10 mg total) by mouth daily. 07/13/12  Yes Brent Bulla, MD  lisinopril-hydrochlorothiazide (PRINZIDE,ZESTORETIC) 20-12.5 MG per tablet Take 1 tablet by mouth daily. 10/04/11  Yes Brent Bulla, MD  Multiple Vitamins-Minerals (MULTIVITAMIN WITH MINERALS) tablet Take 1 tablet by mouth daily.   Yes Historical Provider, MD  psyllium (METAMUCIL) 58.6 % powder Take 1 packet by mouth daily as needed (Fiber).    Yes Historical Provider, MD  Saw Palmetto 80 MG CAPS Take 1 capsule by mouth daily.   Yes  Historical Provider, MD  sertraline (ZOLOFT) 100 MG tablet Take 100 mg by mouth daily.   Yes Historical Provider, MD   Physical Exam: Filed Vitals:   10/24/12 1130 10/24/12 1208 10/24/12 1230 10/24/12 1400  BP: 108/62  107/52 98/48  Pulse: 83  75 76  Temp:      TempSrc:      Resp: 14  19 17   Height:      Weight:      SpO2: 94% 95% 95% 94%     General:  Obese, NAD  Eyes: PERRL EOMI no scleral icterus  ENT: ears clear, no nasal drainage, oropharynx without exudate, slightly dry pink  Neck: supple no JVD full rom. No lymphadenopathy  Cardiovascular: RRR No MGR no LEE PPP bilaterally  Respiratory: normal effort dry, tight sounding cough during exam. BS clear with good air flow no wheeze/rhonch  Abdomen: obese, soft +BS in all quadrants non-tender to palpation  Skin: skin warm dry No rash. Dime size healing wound from recent mole excision. Right leg with well healed scar from multiple surgeries due to trauma 1999  Musculoskeletal: no joint swelling/erythema full ROM  Psychiatric: calm cooperative  Neurologic: cranial nerve II-XII intact speech clear facial symmetry  Labs on Admission:  Basic Metabolic Panel:  Recent Labs Lab 10/24/12 0815  NA 136  K 3.8  CL 104  CO2 22  GLUCOSE 121*  BUN 23  CREATININE 1.00  CALCIUM 9.5   Liver Function Tests: No results found for this basename: AST, ALT, ALKPHOS, BILITOT, PROT, ALBUMIN,  in the last 168 hours No results found for this basename: LIPASE, AMYLASE,  in the last 168 hours No results found for this basename: AMMONIA,  in the last 168 hours CBC:  Recent Labs Lab 10/24/12 0815  WBC 7.3  NEUTROABS 5.5  HGB 16.0  HCT 45.7  MCV 81.9  PLT 244   Cardiac Enzymes:  Recent Labs Lab 10/24/12 0815 10/24/12 1153  TROPONINI <0.30 <0.30    BNP (last 3 results)  Recent Labs  10/24/12 0815  PROBNP 14.4   CBG: No results found for this basename: GLUCAP,  in the last 168 hours  Radiological Exams on  Admission: Dg Chest 2 View  10/24/2012  *RADIOLOGY REPORT*  Clinical Data: Shortness of breath  CHEST - 2 VIEW  Comparison: 08/01/2011  Findings: The heart size is normal.  No pleural effusion or edema. No airspace consolidation identified.  Review of the visualized osseous structures is unremarkable.  IMPRESSION:  1.  No acute cardiopulmonary abnormalities.   Original Report Authenticated By: Signa Kell, M.D.    Ct Angio Chest W/cm &/or Wo Cm  10/24/2012  *RADIOLOGY REPORT*  Clinical Data: Anterior right-sided chest pain with difficulty swallowing.  CT ANGIOGRAPHY CHEST  Technique:  Multidetector CT imaging of the chest using the standard protocol during bolus administration of intravenous contrast. Multiplanar reconstructed images including MIPs were obtained and reviewed to evaluate the vascular anatomy.  Contrast: OMNIPAQUE IOHEXOL 350 MG/ML SOLN, 80mL OMNIPAQUE IOHEXOL 350 MG/ML SOLN  Comparison: Chest radiograph 10/24/2012.  Findings: Evaluation for pulmonary emboli is limited by suboptimal opacification of the segmental and subsegmental pulmonary arteries, despite a second IV contrast bolus attempt.  No central or lobar pulmonary embolus.  No pathologically enlarged mediastinal, hilar or axillary lymph nodes.  Atherosclerotic calcification of the arterial vasculature, including coronary arteries.  Heart size normal.  No pericardial effusion.  Scattered scarring at the lung bases.  Calcified granuloma in the right lower lobe.  No pleural fluid.  Airway is unremarkable.  Incidental imaging of the upper abdomen shows no acute findings. No worrisome lytic or sclerotic lesions.  IMPRESSION:  1.  Detection of pulmonary emboli is limited in the segmental and subsegmental pulmonary arteries due to suboptimal contrast opacification, despite two IV contrast bolus attempts.  No central or lobar pulmonary embolus. 2.  No acute findings to explain the patient's given symptoms.   Original Report Authenticated  By: Leanna Battles, M.D.     EKG: Independently reviewed.   Assessment/Plan Principal Problem:   Dyspnea: likely related to viral type bronchitis. Will admit to observation. Chest xray neg, CT chest without central or lobar PE. [id number]. Troponin neg x2 Good air flow on exam with normal effort. Pt does have intermittent dry tight cough. In ED he received nebs, decadron, morphine, zofran, toradol. Much improved on my exam. Will continue nebs. Provide robitussin.  Active Problems:  Cough: intermittent. Likely due to viral type process. See #1.   HYPERCHOLESTEROLEMIA: continue home meds   OBESITY, NOS: BMI 39. Reports currently on Nutrisystem plan and has lost 33 lbs this year   DEPRESSIVE DISORDER, NOS: stable continue home med   HYPERTENSION, BENIGN ESSENTIAL: controlled. SBP range 98-115. Continue home ACE inhibitor and HCTZ low dose   GASTROESOPHAGEAL REFLUX, NO ESOPHAGITIS: stable at baseline. Continue home med    Code Status: full Family Communication: son at bedside Disposition Plan: home likely in am  Time spent: 30 minutes  [facility]   If 7PM-7AM, please contact night-coverage [email or web address] Password [facility] 10/24/2012, 2:13 PM  Attending note:  Patient admitted for acute shortness of breath and wheezing.  May be developing a viral upper resp tract infection.  Started on supportive treatment.  Anxiety is likely paying a major role here in exacerbating his symptoms.  Will continue supportive treatment overnight and keep as observation status.  Ambulate tomorrow on room air.  Currently his lungs are clear and he does not appear to be significantly short of breath.  His work up is unremarkable.  Possible discharge home tomorrow if remains stable.

## 2012-10-24 NOTE — ED Provider Notes (Signed)
History  This chart was scribed for [name] Co, MD by Bennett Scrape, ED Scribe. This patient was seen in room APA14/APA14 and the patient's care was started at 8:13 AM.  CSN: [id number]  Arrival date & time 10/24/12  0803   First MD Initiated Contact with Patient 10/24/12 0813      Chief Complaint  Patient presents with  . Shortness of Breath     The history is provided by the patient. No language interpreter was used.    [name] [name] is a 67 y.o. male brought in by ambulance, who presents to the Emergency Department complaining of one hour of sudden onset, gradually worsening, constant SOB with associated diaphoresis and throat pain described as tightness that he woke up with. The SOB is worse with exertion and the throat pain is worse with coughing and swallowing. He reports one prior episode "several years ago" diagnosed as a bronchial spasms but denies having frequent episodes of the same. He reports prior lung issues diagnosed as bronchitis and PNA and states that he has used inhalers before. He states that he has followed up with a pulmonologist and had negative XRs. He denies having a bronchoscopy performed. He denies having a h/o cardiac problems but reports that his mother had 2 prior MIs. He has a h/o HTN and HLD but denies DM and smoking. He reports a mild cough and fever that started yesterday but denies acute CP, abdominal pain, nausea, emesis, leg swelling, diarrhea and nasal congestion as associated symptoms. He also has a h/o GERD and depression and he denies alcohol use.  PCP is Dr. Luan Pulling with [facility]  Past Medical History  Diagnosis Date  . GERD (gastroesophageal reflux disease)   . Depression   . Hypertension   . Arthritis   . 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 Evette Cristal   . Diverticula of colon 03-10-2004    Dr. Evette Cristal   . Hyperlipemia     Past Surgical History  Procedure Laterality Date  . Appendectomy    .  Fracture surgery    . Shoulder arthroscopy    . Leg surgery    . Arm surgery      Family History  Problem Relation Age of Onset  . Emphysema Father     copd  . Heart disease Mother   . Cancer Mother     mastoid ear  . Lung cancer Mother     History  Substance Use Topics  . Smoking status: Never Smoker   . Smokeless tobacco: Never Used  . Alcohol Use: No      Review of Systems  A complete 10 system review of systems was obtained and all systems are negative except as noted in the HPI and PMH.   Allergies  Lodine  Home Medications   Current Outpatient Rx  Name  Route  Sig  Dispense  Refill  . aspirin 81 MG EC tablet   Oral   Take 81 mg by mouth daily.           . cephALEXin (KEFLEX) 500 MG capsule   Oral   Take 1 capsule (500 mg total) by mouth 2 (two) times daily.   10 capsule   0   . ezetimibe (ZETIA) 10 MG tablet   Oral   Take 1 tablet (10 mg total) by mouth daily.   90 tablet   3   . HYDROcodone-acetaminophen (NORCO) 10-325 MG per tablet  Oral   Take 1 tablet by mouth every 8 (eight) hours as needed for pain.   30 tablet   0   . lisinopril-hydrochlorothiazide (PRINZIDE,ZESTORETIC) 20-12.5 MG per tablet   Oral   Take 1 tablet by mouth daily.   90 tablet   2   . Multiple Vitamins-Minerals (MULTIVITAMIN WITH MINERALS) tablet   Oral   Take 1 tablet by mouth daily.         . mupirocin ointment (BACTROBAN) 2 %   Topical   Apply topically 2 (two) times daily. Apply for 5 days   22 g   0   . psyllium (METAMUCIL) 58.6 % powder   Oral   Take 1 packet by mouth 3 (three) times daily.         . sertraline (ZOLOFT) 100 MG tablet   Oral   Take 1 tablet (100 mg total) by mouth daily.   30 tablet   3   . tamsulosin (FLOMAX) 0.4 MG CAPS               . temazepam (RESTORIL) 15 MG capsule   Oral   Take 15 mg by mouth at bedtime as needed.           Triage Vitals: BP 138/58  Temp(Src) 98.2 F (36.8 C) (Oral)  Resp 28  Ht 6\' 1"   (1.854 m)  Wt 295 lb (133.811 kg)  BMI 38.93 kg/m2  SpO2 100%  Physical Exam  Nursing note and vitals reviewed. Constitutional: He is oriented to person, place, and time. He appears well-developed and well-nourished.  HENT:  Head: Normocephalic and atraumatic.  Posterior pharynx is normal  Eyes: EOM are normal.  Neck: Normal range of motion.  Mild tenderness of the neck bilaterally, no crepitance, no significant lymphadenopathy, no thyroidmegaly    Cardiovascular: Normal rate, regular rhythm, normal heart sounds and intact distal pulses.   Pulmonary/Chest: Effort normal and breath sounds normal. No respiratory distress.  Clear lungs bilaterally  Abdominal: Soft. He exhibits no distension. There is no tenderness.  Musculoskeletal: Normal range of motion.  Lymphadenopathy:    He has no cervical adenopathy.  Neurological: He is alert and oriented to person, place, and time.  Skin: Skin is warm and dry.  Psychiatric: He has a normal mood and affect. Judgment normal.    ED Course  Procedures (including critical care time)  DIAGNOSTIC STUDIES: Oxygen Saturation is 100% on 2 L [place], normal by my interpretation.    COORDINATION OF CARE: 8:19 AM-Discussed treatment plan which includes a breathing treatment, CXR, CBC panel, and troponin with pt at bedside and pt agreed to plan.   8:27 AM-Pt rechecked and reports improvement with the breathing treatment.  9:32 AM- Pt reports that the breathing treatment did improve his cough as well but c/o bilateral CP with coughing only currently. Informed pt of negative lab and radiology results. Upon re-exam, pt's lungs are still clear. Will order CT of chest.  9:45 AM- Ordered 6 mg morphine and 60 mg Toradol injection  11:02 AM- Pt rechecked and reports improvement. He is resting comfortably.  11:32 AM- Pt rechecked and reports that although he feels improved, his breathing is not back to normal. Sitting up and coughing makes the pt SOB. He has not  tried ambulating yet. Discussed admission for overnight and pt agreed.   Date: 10/24/2012  Rate: 78  Rhythm: normal sinus rhythm  QRS Axis: normal  Intervals: normal  ST/T Wave abnormalities: normal  Conduction Disutrbances: none  Narrative Interpretation:   Old EKG Reviewed: No significant changes noted   Labs Reviewed  BASIC METABOLIC PANEL - Abnormal; Notable for the following:    Glucose, Bld 121 (*)    GFR calc non Af Amer 77 (*)    GFR calc Af Amer 89 (*)    All other components within normal limits  CBC WITH DIFFERENTIAL  TROPONIN I  PRO B NATRIURETIC PEPTIDE   Dg Chest 2 View  10/24/2012  *RADIOLOGY REPORT*  Clinical Data: Shortness of breath  CHEST - 2 VIEW  Comparison: 08/01/2011  Findings: The heart size is normal.  No pleural effusion or edema. No airspace consolidation identified.  Review of the visualized osseous structures is unremarkable.  IMPRESSION:  1.  No acute cardiopulmonary abnormalities.   Original Report Authenticated By: Signa Kell, M.D.    Ct Angio Chest W/cm &/or Wo Cm  10/24/2012  *RADIOLOGY REPORT*  Clinical Data: Anterior right-sided chest pain with difficulty swallowing.  CT ANGIOGRAPHY CHEST  Technique:  Multidetector CT imaging of the chest using the standard protocol during bolus administration of intravenous contrast. Multiplanar reconstructed images including MIPs were obtained and reviewed to evaluate the vascular anatomy.  Contrast: OMNIPAQUE IOHEXOL 350 MG/ML SOLN, 80mL OMNIPAQUE IOHEXOL 350 MG/ML SOLN  Comparison: Chest radiograph 10/24/2012.  Findings: Evaluation for pulmonary emboli is limited by suboptimal opacification of the segmental and subsegmental pulmonary arteries, despite a second IV contrast bolus attempt.  No central or lobar pulmonary embolus.  No pathologically enlarged mediastinal, hilar or axillary lymph nodes.  Atherosclerotic calcification of the arterial vasculature, including coronary arteries.  Heart size normal.  No  pericardial effusion.  Scattered scarring at the lung bases.  Calcified granuloma in the right lower lobe.  No pleural fluid.  Airway is unremarkable.  Incidental imaging of the upper abdomen shows no acute findings. No worrisome lytic or sclerotic lesions.  IMPRESSION:  1.  Detection of pulmonary emboli is limited in the segmental and subsegmental pulmonary arteries due to suboptimal contrast opacification, despite two IV contrast bolus attempts.  No central or lobar pulmonary embolus. 2.  No acute findings to explain the patient's given symptoms.   Original Report Authenticated By: Leanna Battles, M.D.        I personally reviewed the imaging tests through PACS system I reviewed available ER/hospitalization records through the EMR   1. Dyspnea       MDM   The patient continues to have some shortness of breath with minimal exertion.  No obvious pulmonary embolism however the CT scan was not optimal.  My suspicion for ACS is low as the patient's EKG is normal sounds like his neck pain is worse with palpation and coughing.  Tolerating secretions.  No frank wheezing but some improvement with albuterol.  The patient continues to be dyspneic at this time.  Although the patient in the hospital for ongoing evaluation.  I will attempts racemic epinephrine at this time to see if this helps the symptoms.  No lymphadenopathy.     I personally performed the services described in this documentation, which was scribed in my presence. The recorded information has been reviewed and is accurate.   '  [name] Co, MD 10/24/12 1200

## 2012-10-24 NOTE — ED Notes (Signed)
Patient states he does not need anything at this time. 

## 2012-10-24 NOTE — ED Notes (Addendum)
The patient appears very anxious at times with an increased respiratory rate, states when he has a coughing episode that a feeling comes over him and he feels like he can't breathe at all, states he feels like his throat is closing up.  Pt reassured and respiratory rate returned to normal.  Family now at bedside.

## 2012-10-24 NOTE — ED Notes (Signed)
Tachypnea noted on return from x-ray at 29, pt slowly returned to normal with a rate of 20 currently

## 2012-10-24 NOTE — ED Notes (Signed)
MD at bedside. 

## 2012-10-24 NOTE — ED Notes (Signed)
Complains of throat aching.

## 2012-10-24 NOTE — ED Notes (Signed)
Pt reports cold symptoms since yesterday.  This am woke up around 0700 very SOB, unable to swallow.  Reports felt like throat was closing.  Pt arrived EMS.  Pt labored respirations.  Reports fever and  productive cough.

## 2012-10-24 NOTE — ED Notes (Signed)
The patient reports that he had pneumonia several years ago and his shortness of breath feels similar to that episode.

## 2012-10-25 DIAGNOSIS — E669 Obesity, unspecified: Secondary | ICD-10-CM

## 2012-10-25 MED ORDER — ALBUTEROL SULFATE HFA 108 (90 BASE) MCG/ACT IN AERS: 2.0000 | INHALATION_SPRAY | Freq: Four times a day (QID) | RESPIRATORY_TRACT | Status: DC | PRN

## 2012-10-25 MED ORDER — ALPRAZOLAM 0.5 MG PO TABS: 0.5000 mg | ORAL_TABLET | Freq: Three times a day (TID) | ORAL | Status: DC | PRN

## 2012-10-25 MED ORDER — PREDNISONE 20 MG PO TABS: 40.0000 mg | ORAL_TABLET | Freq: Every day | ORAL | Status: DC

## 2012-10-25 MED ORDER — HYDROCODONE-ACETAMINOPHEN 5-325 MG PO TABS: 1.0000 | ORAL_TABLET | Freq: Four times a day (QID) | ORAL | Status: DC | PRN

## 2012-10-25 MED ORDER — GUAIFENESIN-DM 100-10 MG/5ML PO SYRP: 5.0000 mL | ORAL_SOLUTION | ORAL | Status: DC | PRN

## 2012-10-25 NOTE — Progress Notes (Signed)
Pt. D/c to home. Pt. Previously evaluated for O2 at home and does not qualify. IV removed, prescriptions provided, d/c instructions provided.

## 2012-10-25 NOTE — Care Management Note (Signed)
    Page 1 of 1   10/25/2012     11:42:31 AM   CARE MANAGEMENT NOTE 10/25/2012  Patient:  [name] [name]   Account Number:  [id number]  Date Initiated:  10/25/2012  Documentation initiated by:  [name] [name]  Subjective/Objective Assessment:   Pt admitted from home where he lives alone. Ambulated in hall and per RN does not qualify for home O2. No HH needs identified     Action/Plan:   Anticipated DC Date:  10/25/2012   Anticipated DC Plan:  HOME/SELF CARE      DC Planning Services  CM consult      Choice offered to / List presented to:             Status of service:  Completed, signed off Medicare Important Message given?   (If response is "NO", the following Medicare IM given date fields will be blank) Date Medicare IM given:   Date Additional Medicare IM given:    Discharge Disposition:  HOME/SELF CARE  Per UR Regulation:    If discussed at Long Length of Stay Meetings, dates discussed:    Comments:  10/25/12 [name] Holms RN BSN CM

## 2012-10-25 NOTE — Progress Notes (Signed)
UR Chart Review Completed  

## 2012-10-25 NOTE — Progress Notes (Signed)
Pt. Ambulated aprrox 200 feet on RA. After Approx 100 feet, Pt. Was SOB and felt weak. O2 saturation for duration of walking 94-96% with HR in 80s. Pt.

## 2012-10-25 NOTE — Discharge Summary (Signed)
Physician Discharge Summary  [name] [name] WUJ:[id number] DOB: 1945/11/22 DOA: 10/24/2012  PCP: Majel Homer, MD  Admit date: 10/24/2012 Discharge date: 10/25/2012  Time spent: 40  minutes  Recommendations for Outpatient Follow-up:  1. Has appointment PCP 10/30/12 for symptom follow up. Recommend evaluating anxiety level as well.  Discharge Diagnoses:  Principal Problem:   Dyspnea Active Problems:   HYPERCHOLESTEROLEMIA   OBESITY, NOS   DEPRESSIVE DISORDER, NOS   HYPERTENSION, BENIGN ESSENTIAL   GASTROESOPHAGEAL REFLUX, NO ESOPHAGITIS   Cough   Discharge Condition: stable  Diet recommendation: reg  Filed Weights   10/24/12 0805 10/25/12 0554  Weight: 133.811 kg (295 lb) 143.9 kg (317 lb 3.9 oz)    History of present illness:  [name] [name] is a 67 y.o. male pmhx of HTN, anxiety, depression, GERD, hyperlipidemia who presented to ED on 10/24/12 cc dyspnea. Information obtained from pt. Stated 2 days prior had a "tickle" in throat and coughed intermittently. One day prior he awakened with "head cold". Took mucinex without relief. Coughing worsened and persisted. He reported subjective fever with chills. He stated he stayed awake most of night due to coughing. He reported coughing to point that his "sides hurt". The am of admission he awakened with shortness of breath and "dry throat that i could not swallow or talk".Denied headache, visual disturbances, facial droop or slurred speech. No numbness/tingling of extremities. Reported that breathing became so difficult he had to call EMS.  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 In ED chest xray with no cardiopulmonary abnormalities. CT angio chest without central or lobar PE. VSS sats >90% on room air and on 2L. SBP 98-105, afebrile no white count. TRH asked to admit   Hospital Course:  Dyspnea: likely related to viral type bronchitis. Pt admitted for observation.  Chest xray neg, CT chest without central or lobar PE.  [id number]. Troponin neg x2. He maintained good air flow and oxygen saturation levels >90 on room air. On exam he had normal effort . Pt did have intermittent dry tight cough. Pt verbalized fear with persistent coughing that he cannot breathe. He received nebs, and steroids. No fever, no white count no indication of infection.  Will be discharged with 4 more days prednisone, albuterol inhaler and robitussin. Also providing with low dose xanax anxiety.  He has appointment 10/30/12 with PCP Active Problems:  Cough: intermittent, dry and non-productive. Likely due to viral type process. See #1.   HYPERCHOLESTEROLEMIA: stable   OBESITY, NOS: BMI 39. Reports currently on Nut[employer]ystem plan and has lost 33 lbs this year   DEPRESSIVE DISORDER, NOS: stable. Did demonstrate sign anxiety. Provided with low dose xanax with favorable results, particularly as it pertained to respiratory effort.    HYPERTENSION, BENIGN ESSENTIAL: controlled.   GASTROESOPHAGEAL REFLUX, NO ESOPHAGITIS: stable at baseline.     Procedures:    Consultations:  none  Discharge Exam: Filed Vitals:   10/24/12 2054 10/25/12 0554 10/25/12 0730 10/25/12 1111  BP: 130/80 123/71    Pulse: 81 66    Temp: 97.6 F (36.4 C) 97.6 F (36.4 C)    TempSrc: Oral Oral    Resp: 20 20    Height:      Weight:  143.9 kg (317 lb 3.9 oz)    SpO2: 90% 98% 98% 97%    General: obese, alert NAD Cardiovascular: RRR No MGR No LE edema Respiratory: normal effort BS clear bilaterally no wheeze, no rhonchi good air flow  Discharge Instructions  Discharge Orders   Future Appointments Provider Department Dept Phone   10/31/2012 10:30 AM Brent Bulla, MD [facility] [phone]   Future Orders Complete By Expires     Call MD for:  difficulty breathing, headache or visual disturbances  As directed     Call MD for:  persistant dizziness or light-headedness  As directed     Call MD for:  temperature >100.4  As directed      Diet - low sodium heart healthy  As directed     Increase activity slowly  As directed         Medication List    TAKE these medications       albuterol 108 (90 BASE) MCG/ACT inhaler  Commonly known as:  PROVENTIL HFA;VENTOLIN HFA  Inhale 2 puffs into the lungs every 6 (six) hours as needed for wheezing.     ALPRAZolam 0.5 MG tablet  Commonly known as:  XANAX  Take 1 tablet (0.5 mg total) by mouth 3 (three) times daily as needed for anxiety.     aspirin 81 MG EC tablet  Take 81 mg by mouth daily.     ezetimibe 10 MG tablet  Commonly known as:  ZETIA  Take 1 tablet (10 mg total) by mouth daily.     guaiFENesin-dextromethorphan 100-10 MG/5ML syrup  Commonly known as:  ROBITUSSIN DM  Take 5 mLs by mouth every 4 (four) hours as needed for cough.     HYDROcodone-acetaminophen 5-325 MG per tablet  Commonly known as:  NORCO/VICODIN  Take 1 tablet by mouth every 6 (six) hours as needed.     lisinopril-hydrochlorothiazide 20-12.5 MG per tablet  Commonly known as:  PRINZIDE,ZESTORETIC  Take 1 tablet by mouth daily.     multivitamin with minerals tablet  Take 1 tablet by mouth daily.     predniSONE 20 MG tablet  Commonly known as:  DELTASONE  Take 2 tablets (40 mg total) by mouth daily.     psyllium 58.6 % powder  Commonly known as:  METAMUCIL  Take 1 packet by mouth daily as needed (Fiber).     Saw Palmetto 80 MG Caps  Take 1 capsule by mouth daily.     sertraline 100 MG tablet  Commonly known as:  ZOLOFT  Take 100 mg by mouth daily.       Follow-up Information   Follow up with Majel Homer, MD On 10/31/2012. (has appointment 10:30 am)    Contact information:   [address] [place] [address] [phone]        The results of significant diagnostics from this hospitalization (including imaging, microbiology, ancillary and laboratory) are listed below for reference.    Significant Diagnostic Studies: Dg Chest 2 View  10/24/2012  *RADIOLOGY  REPORT*  Clinical Data: Shortness of breath  CHEST - 2 VIEW  Comparison: 08/01/2011  Findings: The heart size is normal.  No pleural effusion or edema. No airspace consolidation identified.  Review of the visualized osseous structures is unremarkable.  IMPRESSION:  1.  No acute cardiopulmonary abnormalities.   Original Report Authenticated By: Signa Kell, M.D.    Ct Angio Chest W/cm &/or Wo Cm  10/24/2012  *RADIOLOGY REPORT*  Clinical Data: Anterior right-sided chest pain with difficulty swallowing.  CT ANGIOGRAPHY CHEST  Technique:  Multidetector CT imaging of the chest using the standard protocol during bolus administration of intravenous contrast. Multiplanar reconstructed images including MIPs were obtained and reviewed to evaluate the vascular anatomy.  Contrast: OMNIPAQUE IOHEXOL 350 MG/ML SOLN, 80mL OMNIPAQUE IOHEXOL 350 MG/ML SOLN  Comparison: Chest radiograph 10/24/2012.  Findings: Evaluation for pulmonary emboli is limited by suboptimal opacification of the segmental and subsegmental pulmonary arteries, despite a second IV contrast bolus attempt.  No central or lobar pulmonary embolus.  No pathologically enlarged mediastinal, hilar or axillary lymph nodes.  Atherosclerotic calcification of the arterial vasculature, including coronary arteries.  Heart size normal.  No pericardial effusion.  Scattered scarring at the lung bases.  Calcified granuloma in the right lower lobe.  No pleural fluid.  Airway is unremarkable.  Incidental imaging of the upper abdomen shows no acute findings. No worrisome lytic or sclerotic lesions.  IMPRESSION:  1.  Detection of pulmonary emboli is limited in the segmental and subsegmental pulmonary arteries due to suboptimal contrast opacification, despite two IV contrast bolus attempts.  No central or lobar pulmonary embolus. 2.  No acute findings to explain the patient's given symptoms.   Original Report Authenticated By: Leanna Battles, M.D.     Microbiology: No  results found for this or any previous visit (from the past 240 hour(s)).   Labs: Basic Metabolic Panel:  Recent Labs Lab 10/24/12 0815  NA 136  K 3.8  CL 104  CO2 22  GLUCOSE 121*  BUN 23  CREATININE 1.00  CALCIUM 9.5   Liver Function Tests: No results found for this basename: AST, ALT, ALKPHOS, BILITOT, PROT, ALBUMIN,  in the last 168 hours No results found for this basename: LIPASE, AMYLASE,  in the last 168 hours No results found for this basename: AMMONIA,  in the last 168 hours CBC:  Recent Labs Lab 10/24/12 0815  WBC 7.3  NEUTROABS 5.5  HGB 16.0  HCT 45.7  MCV 81.9  PLT 244   Cardiac Enzymes:  Recent Labs Lab 10/24/12 0815 10/24/12 1153 10/24/12 1741  TROPONINI <0.30 <0.30 <0.30   BNP: BNP (last 3 results)  Recent Labs  10/24/12 0815  PROBNP 14.4   CBG: No results found for this basename: GLUCAP,  in the last 168 hours     Signed:  Gwenyth Bender  [facility] 10/25/2012, 12:57 PM  Attending note:  Patient seen and independently examined.  Patient was initially admitted for subjective shortness of breath. Objective workup has been unremarkable. He does not have a significant findings on chest x-ray or CT chest. White count is normal and patient has been afebrile. His oxygen saturations are normal on room air. He may have developed a viral bronchitis. Per ER reports, he did have some wheezing on initial presentation. Currently his lungs are clear. Even given a prescription for prednisone and albuterol when necessary. It is likely that anxiety is playing a major role in the patient's symptoms. He has been given a prescription for Xanax until he sees his primary care doctor. I do not think he requires further inpatient hospital stay.

## 2012-10-31 ENCOUNTER — Ambulatory Visit (INDEPENDENT_AMBULATORY_CARE_PROVIDER_SITE_OTHER): Payer: Medicare HMO | Admitting: Family Medicine

## 2012-10-31 ENCOUNTER — Telehealth: Payer: Self-pay | Admitting: Family Medicine

## 2012-10-31 ENCOUNTER — Ambulatory Visit
Admission: RE | Admit: 2012-10-31 | Discharge: 2012-10-31 | Disposition: A | Discharge status: Home / Self Care | Payer: Medicare HMO | Source: Ambulatory Visit | Attending: Family Medicine | Admitting: Family Medicine

## 2012-10-31 ENCOUNTER — Encounter: Payer: Self-pay | Admitting: Family Medicine

## 2012-10-31 VITALS — BP 113/67 | HR 90 | Wt 310.2 lb

## 2012-10-31 DIAGNOSIS — R0989 Other specified symptoms and signs involving the circulatory and respiratory systems: Secondary | ICD-10-CM

## 2012-10-31 DIAGNOSIS — R05 Cough: Secondary | ICD-10-CM

## 2012-10-31 DIAGNOSIS — R0602 Shortness of breath: Secondary | ICD-10-CM

## 2012-10-31 DIAGNOSIS — R0600 Dyspnea, unspecified: Secondary | ICD-10-CM

## 2012-10-31 MED ORDER — LEVOFLOXACIN 500 MG PO TABS: 500.0000 mg | ORAL_TABLET | Freq: Every day | ORAL | Status: DC

## 2012-10-31 MED ORDER — IPRATROPIUM BROMIDE 0.02 % IN SOLN
0.5000 mg | Freq: Once | RESPIRATORY_TRACT | Status: AC
  Administered 2012-10-31: 0.5 mg via RESPIRATORY_TRACT

## 2012-10-31 MED ORDER — ALBUTEROL SULFATE (2.5 MG/3ML) 0.083% IN NEBU
2.5000 mg | INHALATION_SOLUTION | Freq: Once | RESPIRATORY_TRACT | Status: AC
  Administered 2012-10-31: 2.5 mg via RESPIRATORY_TRACT

## 2012-10-31 MED ORDER — BENZONATATE 200 MG PO CAPS: 200.0000 mg | ORAL_CAPSULE | Freq: Two times a day (BID) | ORAL | Status: DC | PRN

## 2012-10-31 NOTE — Telephone Encounter (Signed)
Please let him know he does not have a large pneumonia.

## 2012-10-31 NOTE — Patient Instructions (Signed)
I have sent in an antibiotic to take once per day for the next 7 days. I want you to use your albuterol every 4 hours while awake for the next 7 days. Please go to [facility] to get a chest x-ray.  If you have a bad pneumonia, I will let you know later today. Please get back in to see [name] either on Friday or Monday.

## 2012-10-31 NOTE — Telephone Encounter (Signed)
Spoke with patient and informed him of below 

## 2012-11-05 ENCOUNTER — Encounter: Payer: Self-pay | Admitting: Family Medicine

## 2012-11-05 ENCOUNTER — Ambulatory Visit (INDEPENDENT_AMBULATORY_CARE_PROVIDER_SITE_OTHER): Payer: Medicare HMO | Admitting: Family Medicine

## 2012-11-05 VITALS — BP 140/67 | HR 74 | Temp 98.2°F | Ht 73.0 in | Wt 307.0 lb

## 2012-11-05 DIAGNOSIS — R0600 Dyspnea, unspecified: Secondary | ICD-10-CM

## 2012-11-05 DIAGNOSIS — R05 Cough: Secondary | ICD-10-CM

## 2012-11-05 DIAGNOSIS — R0989 Other specified symptoms and signs involving the circulatory and respiratory systems: Secondary | ICD-10-CM

## 2012-11-05 NOTE — Patient Instructions (Signed)
It was good to see you today! Please take your albuterol only as needed now. If you are still having problems with nasal congestion in a week, give me a call and I will send in a nasal steroid. Make an appointment to see me in 30 days.

## 2012-11-08 NOTE — Assessment & Plan Note (Signed)
As patient has not responded as expected for an asthma/COPD exacerbation (of note, he has no personal smoking history) am concerned about development of infection.  Will treat as CAP with levaquin, scheduled albuterol, CXR, and RTC for recheck in several days.  Will also give tessalon for cough as this is one of his more bothersome symptoms.

## 2012-11-08 NOTE — Progress Notes (Signed)
Patient ID: [name] [name], male   DOB: 15-Dec-1945, 67 y.o.   MRN: [id number] Subjective: The patient is a 67 y.o. year old male who presents today for hfu.  Admitted for shortness of breath/wheezing.  Discharged several days ago.  Sent home on prednisone.  No abx administered.  Patient continues to have significant shortness of breath and DOE.  Large amounts of coughing, some bad enough that he gets close to passing out.  No fevers/chills, cough is moderately productive.  Minimal rhinorrhea.  Patient's past medical, social, and family history were reviewed and updated as appropriate. History  Substance Use Topics  . Smoking status: Never Smoker   . Smokeless tobacco: Never Used  . Alcohol Use: No   Objective:  Filed Vitals:   10/31/12 1032  BP: 113/67  Pulse: 90   Gen: Working hard to breath, unwell appearing HEENT: Minimal clear rhinorrhea CV: RRR Resp: Diffuse wheezes with some ronchi bilaterally.  Significant amount of coughing  Assessment/Plan:  Please also see individual problems in problem list for problem-specific plans.

## 2012-11-12 ENCOUNTER — Encounter: Payer: Self-pay | Admitting: Family Medicine

## 2012-11-12 NOTE — Assessment & Plan Note (Signed)
Improved. We will back to albuterol usage 2 as needed. Patient will followup in several weeks. At that time, assuming he is asymptomatic, we will obtain pulmonary function tests. I wonder if, with the patient's repeated history of pneumonia, he has an underlying reactive airway disease component.

## 2012-11-12 NOTE — Progress Notes (Signed)
Patient ID: [name] [name], male   DOB: 1946/07/10, 67 y.o.   MRN: [id number] Subjective: The patient is a 67 y.o. year old male who presents today for followup.  Patient reports that his breathing is significantly better as is his cough. He is no longer getting short of breath with any exertion. He still does have some problems with cough but reports that Tessalon controls it. He is sleeping much better. He has good appetite. No nausea or vomiting. He still feels a little bit tired and run down.  Patient's past medical, social, and family history were reviewed and updated as appropriate. History  Substance Use Topics  . Smoking status: Never Smoker   . Smokeless tobacco: Never Used  . Alcohol Use: No   Objective:  Filed Vitals:   11/05/12 1059  BP: 140/67  Pulse: 74  Temp: 98.2 F (36.8 C)   Gen: No acute distress, morbidly obese CV: Regular rate and rhythm, no murmurs Resp: Initially faint crackles at the bases but with deep breaths these disappeared. Otherwise lungs are clear bilaterally with good air movement   Assessment/Plan:  Please also see individual problems in problem list for problem-specific plans.

## 2012-11-12 NOTE — Assessment & Plan Note (Signed)
Continue Tessalon as needed

## 2012-12-03 ENCOUNTER — Other Ambulatory Visit: Payer: Self-pay | Admitting: *Deleted

## 2012-12-03 MED ORDER — LISINOPRIL-HYDROCHLOROTHIAZIDE 20-12.5 MG PO TABS: 1.0000 | ORAL_TABLET | Freq: Every day | ORAL | Status: DC

## 2012-12-05 ENCOUNTER — Ambulatory Visit (INDEPENDENT_AMBULATORY_CARE_PROVIDER_SITE_OTHER): Payer: Medicare HMO | Admitting: Family Medicine

## 2012-12-05 ENCOUNTER — Encounter: Payer: Self-pay | Admitting: Family Medicine

## 2012-12-05 VITALS — BP 125/84 | HR 76 | Temp 98.1°F | Ht 73.0 in | Wt 308.2 lb

## 2012-12-05 DIAGNOSIS — F329 Major depressive disorder, single episode, unspecified: Secondary | ICD-10-CM

## 2012-12-05 DIAGNOSIS — Z8701 Personal history of pneumonia (recurrent): Secondary | ICD-10-CM

## 2012-12-05 DIAGNOSIS — N400 Enlarged prostate without lower urinary tract symptoms: Secondary | ICD-10-CM

## 2012-12-05 MED ORDER — TAMSULOSIN HCL 0.4 MG PO CAPS: 0.4000 mg | ORAL_CAPSULE | Freq: Two times a day (BID) | ORAL | Status: DC

## 2012-12-05 NOTE — Assessment & Plan Note (Signed)
We'll give the patient's dose of Zoloft the same for now. It appears to have helped at least somewhat. Throat is low energy are more likely related to his weight and lack of exercise.

## 2012-12-05 NOTE — Assessment & Plan Note (Signed)
Due to the patient's repeated problems with pneumonia I will obtain pulmonary function tests. At length and B. both pre-and post bronchodilator. I wonder if the patient has an underlying reactive airway disease component and might benefit from inhaled corticosteroids.

## 2012-12-05 NOTE — Patient Instructions (Signed)
It was good to see you today! We are leaving your Zoloft dose the same. Increase your Flomax to twice per day. Come back some time in the next several weeks for pulmonary functions tests with Dr. Raymondo Band.  Try not to use any albuterol for several days before this.

## 2012-12-05 NOTE — Progress Notes (Signed)
Patient ID: [name] [name], male   DOB: Jul 25, 1946, 67 y.o.   MRN: [id number] Subjective: The patient is a 67 y.o. year old male who presents today for followup.  1. Depression: Changed from Celexa to Zoloft about 3 months ago. Tolerating this medicine well. PH q. 9 has decreased from 5-2. Patient still does have some problems with low energy but otherwise reports that his mood is good. No problems with GI side effects. No problems with thoughts of self-harm.  2. Breathing: Patient reports that, since his last visit, he is not had any problems with his breathing. He is not using albuterol and reports no problems with shortness of breath, cough, fevers, chills, or chest pain.  3. BPH: Patient's [name] urologist a month ago and was started on Flomax for a strain and trouble initiating urination. He reports this helps some but not completely. He still has some of the above problems.  Patient's past medical, social, and family history were reviewed and updated as appropriate. History  Substance Use Topics  . Smoking status: Never Smoker   . Smokeless tobacco: Never Used  . Alcohol Use: No   Objective:  Filed Vitals:   12/05/12 0856  BP: 125/84  Pulse: 76  Temp: 98.1 F (36.7 C)   Gen: No acute distress, morbidly obese CV: Regular rate and rhythm, no murmurs appreciated Resp: Clear to auscultation bilaterally. No wheezes rales or rhonchi.  Assessment/Plan:  Please also see individual problems in problem list for problem-specific plans.

## 2012-12-05 NOTE — Assessment & Plan Note (Signed)
Increase Flomax dose from 0.4 mg daily to 0.8 mg daily. If this does not cause his symptoms to abate appropriately, I would plan to add finasteride.

## 2012-12-19 ENCOUNTER — Encounter (HOSPITAL_COMMUNITY): Payer: Self-pay | Admitting: *Deleted

## 2012-12-19 ENCOUNTER — Inpatient Hospital Stay (HOSPITAL_COMMUNITY)
Admission: EM | Admit: 2012-12-19 | Discharge: 2012-12-21 | DRG: 194 | Disposition: A | Discharge status: Home / Self Care | Payer: Medicare HMO | Attending: Internal Medicine | Admitting: Internal Medicine

## 2012-12-19 ENCOUNTER — Telehealth: Payer: Self-pay | Admitting: Family Medicine

## 2012-12-19 ENCOUNTER — Emergency Department (HOSPITAL_COMMUNITY): Payer: Medicare HMO

## 2012-12-19 DIAGNOSIS — F329 Major depressive disorder, single episode, unspecified: Secondary | ICD-10-CM

## 2012-12-19 DIAGNOSIS — N400 Enlarged prostate without lower urinary tract symptoms: Secondary | ICD-10-CM

## 2012-12-19 DIAGNOSIS — G8929 Other chronic pain: Secondary | ICD-10-CM | POA: Diagnosis present

## 2012-12-19 DIAGNOSIS — R7989 Other specified abnormal findings of blood chemistry: Secondary | ICD-10-CM

## 2012-12-19 DIAGNOSIS — F3289 Other specified depressive episodes: Secondary | ICD-10-CM

## 2012-12-19 DIAGNOSIS — K429 Umbilical hernia without obstruction or gangrene: Secondary | ICD-10-CM

## 2012-12-19 DIAGNOSIS — Z8701 Personal history of pneumonia (recurrent): Secondary | ICD-10-CM

## 2012-12-19 DIAGNOSIS — E7800 Pure hypercholesterolemia, unspecified: Secondary | ICD-10-CM

## 2012-12-19 DIAGNOSIS — Z7982 Long term (current) use of aspirin: Secondary | ICD-10-CM

## 2012-12-19 DIAGNOSIS — J189 Pneumonia, unspecified organism: Principal | ICD-10-CM

## 2012-12-19 DIAGNOSIS — Z79899 Other long term (current) drug therapy: Secondary | ICD-10-CM

## 2012-12-19 DIAGNOSIS — R1032 Left lower quadrant pain: Secondary | ICD-10-CM

## 2012-12-19 DIAGNOSIS — R509 Fever, unspecified: Secondary | ICD-10-CM

## 2012-12-19 DIAGNOSIS — G44209 Tension-type headache, unspecified, not intractable: Secondary | ICD-10-CM

## 2012-12-19 DIAGNOSIS — E785 Hyperlipidemia, unspecified: Secondary | ICD-10-CM | POA: Diagnosis present

## 2012-12-19 DIAGNOSIS — G2589 Other specified extrapyramidal and movement disorders: Secondary | ICD-10-CM

## 2012-12-19 DIAGNOSIS — K219 Gastro-esophageal reflux disease without esophagitis: Secondary | ICD-10-CM

## 2012-12-19 DIAGNOSIS — D126 Benign neoplasm of colon, unspecified: Secondary | ICD-10-CM

## 2012-12-19 DIAGNOSIS — I10 Essential (primary) hypertension: Secondary | ICD-10-CM

## 2012-12-19 DIAGNOSIS — Z6838 Body mass index (BMI) 38.0-38.9, adult: Secondary | ICD-10-CM

## 2012-12-19 DIAGNOSIS — E871 Hypo-osmolality and hyponatremia: Secondary | ICD-10-CM

## 2012-12-19 DIAGNOSIS — R109 Unspecified abdominal pain: Secondary | ICD-10-CM

## 2012-12-19 DIAGNOSIS — D179 Benign lipomatous neoplasm, unspecified: Secondary | ICD-10-CM

## 2012-12-19 DIAGNOSIS — F5232 Male orgasmic disorder: Secondary | ICD-10-CM

## 2012-12-19 DIAGNOSIS — E669 Obesity, unspecified: Secondary | ICD-10-CM

## 2012-12-19 HISTORY — DX: Unspecified abdominal pain: R10.9

## 2012-12-19 HISTORY — DX: Other chronic pain: G89.29

## 2012-12-19 HISTORY — DX: Benign prostatic hyperplasia without lower urinary tract symptoms: N40.0

## 2012-12-19 HISTORY — DX: Abdominal migraine, not intractable: G43.D0

## 2012-12-19 LAB — CBC WITH DIFFERENTIAL/PLATELET
Basophils Absolute: 0 10*3/uL (ref 0.0–0.1)
Eosinophils Absolute: 0.1 10*3/uL (ref 0.0–0.7)
Lymphs Abs: 0.9 10*3/uL (ref 0.7–4.0)
MCH: 28 pg (ref 26.0–34.0)
Neutrophils Relative %: 77 % (ref 43–77)
Platelets: 189 10*3/uL (ref 150–400)
RBC: 5.1 MIL/uL (ref 4.22–5.81)
WBC: 8.6 10*3/uL (ref 4.0–10.5)

## 2012-12-19 LAB — URINALYSIS, ROUTINE W REFLEX MICROSCOPIC
Glucose, UA: NEGATIVE mg/dL
Ketones, ur: NEGATIVE mg/dL

## 2012-12-19 LAB — CK TOTAL AND CKMB (NOT AT ARMC)
CK, MB: 1.6 ng/mL (ref 0.3–4.0)
Relative Index: INVALID (ref 0.0–2.5)
Total CK: 34 U/L (ref 7–232)

## 2012-12-19 LAB — COMPREHENSIVE METABOLIC PANEL
ALT: 87 U/L — ABNORMAL HIGH (ref 0–53)
AST: 66 U/L — ABNORMAL HIGH (ref 0–37)
Albumin: 3.2 g/dL — ABNORMAL LOW (ref 3.5–5.2)
Alkaline Phosphatase: 80 U/L (ref 39–117)
GFR calc Af Amer: 77 mL/min — ABNORMAL LOW (ref >90)
Glucose, Bld: 131 mg/dL — ABNORMAL HIGH (ref 70–99)
Potassium: 3.3 mEq/L — ABNORMAL LOW (ref 3.5–5.1)
Sodium: 128 mEq/L — ABNORMAL LOW (ref 135–145)
Total Protein: 6.8 g/dL (ref 6.0–8.3)

## 2012-12-19 LAB — URINE MICROSCOPIC-ADD ON

## 2012-12-19 MED ORDER — ONDANSETRON HCL 4 MG/2ML IJ SOLN: 4.0000 mg | Freq: Three times a day (TID) | INTRAMUSCULAR | Status: DC | PRN

## 2012-12-19 MED ORDER — ONDANSETRON HCL 4 MG/2ML IJ SOLN
4.0000 mg | Freq: Four times a day (QID) | INTRAMUSCULAR | Status: DC | PRN
  Administered 2012-12-20: 4 mg via INTRAVENOUS
  Filled 2012-12-19: qty 2

## 2012-12-19 MED ORDER — LEVOFLOXACIN IN D5W 500 MG/100ML IV SOLN
INTRAVENOUS | Status: AC
  Filled 2012-12-19: qty 100

## 2012-12-19 MED ORDER — ENOXAPARIN SODIUM 80 MG/0.8ML ~~LOC~~ SOLN
70.0000 mg | SUBCUTANEOUS | Status: DC
  Administered 2012-12-19 – 2012-12-20 (×2): 70 mg via SUBCUTANEOUS
  Filled 2012-12-19 (×2): qty 0.8

## 2012-12-19 MED ORDER — DEXTROSE 5 % IV SOLN
1.0000 g | Freq: Once | INTRAVENOUS | Status: DC
  Filled 2012-12-19: qty 1

## 2012-12-19 MED ORDER — SODIUM CHLORIDE 0.9 % IV SOLN: INTRAVENOUS | Status: AC

## 2012-12-19 MED ORDER — VANCOMYCIN HCL 10 G IV SOLR
1250.0000 mg | Freq: Two times a day (BID) | INTRAVENOUS | Status: DC
  Administered 2012-12-20 (×2): 1250 mg via INTRAVENOUS
  Filled 2012-12-19 (×3): qty 1250

## 2012-12-19 MED ORDER — ALBUTEROL SULFATE HFA 108 (90 BASE) MCG/ACT IN AERS: 2.0000 | INHALATION_SPRAY | Freq: Four times a day (QID) | RESPIRATORY_TRACT | Status: DC | PRN

## 2012-12-19 MED ORDER — DEXTROSE 5 % IV SOLN
2.0000 g | Freq: Once | INTRAVENOUS | Status: AC
  Administered 2012-12-19: 2 g via INTRAVENOUS

## 2012-12-19 MED ORDER — ACETAMINOPHEN 650 MG RE SUPP: 650.0000 mg | Freq: Once | RECTAL | Status: DC

## 2012-12-19 MED ORDER — VANCOMYCIN HCL 10 G IV SOLR
1250.0000 mg | Freq: Two times a day (BID) | INTRAVENOUS | Status: DC
  Filled 2012-12-19: qty 1250

## 2012-12-19 MED ORDER — IOHEXOL 300 MG/ML  SOLN: 50.0000 mL | Freq: Once | INTRAMUSCULAR | Status: DC | PRN

## 2012-12-19 MED ORDER — ONDANSETRON HCL 4 MG/2ML IJ SOLN
4.0000 mg | INTRAMUSCULAR | Status: DC | PRN
  Administered 2012-12-19: 4 mg via INTRAVENOUS
  Filled 2012-12-19: qty 2

## 2012-12-19 MED ORDER — VANCOMYCIN HCL IN DEXTROSE 1-5 GM/200ML-% IV SOLN
1000.0000 mg | Freq: Once | INTRAVENOUS | Status: AC
  Administered 2012-12-19: 1000 mg via INTRAVENOUS
  Filled 2012-12-19: qty 200

## 2012-12-19 MED ORDER — IBUPROFEN 400 MG PO TABS
400.0000 mg | ORAL_TABLET | Freq: Once | ORAL | Status: AC
  Administered 2012-12-19: 400 mg via ORAL
  Filled 2012-12-19: qty 1

## 2012-12-19 MED ORDER — HYDRALAZINE HCL 20 MG/ML IJ SOLN: 10.0000 mg | Freq: Four times a day (QID) | INTRAMUSCULAR | Status: DC | PRN

## 2012-12-19 MED ORDER — EZETIMIBE 10 MG PO TABS
10.0000 mg | ORAL_TABLET | Freq: Every day | ORAL | Status: DC
Start: 2012-12-20 — End: 2012-12-21
  Administered 2012-12-20 – 2012-12-21 (×2): 10 mg via ORAL
  Filled 2012-12-19 (×2): qty 1

## 2012-12-19 MED ORDER — LEVOFLOXACIN IN D5W 500 MG/100ML IV SOLN
500.0000 mg | INTRAVENOUS | Status: DC
  Administered 2012-12-19 – 2012-12-20 (×2): 500 mg via INTRAVENOUS
  Filled 2012-12-19 (×2): qty 100

## 2012-12-19 MED ORDER — DEXTROSE 5 % IV SOLN
INTRAVENOUS | Status: AC
  Filled 2012-12-19: qty 1

## 2012-12-19 MED ORDER — SODIUM CHLORIDE 0.9 % IV SOLN
INTRAVENOUS | Status: DC
  Administered 2012-12-19: 16:00:00 via INTRAVENOUS

## 2012-12-19 MED ORDER — VANCOMYCIN HCL IN DEXTROSE 1-5 GM/200ML-% IV SOLN
INTRAVENOUS | Status: AC
  Filled 2012-12-19: qty 400

## 2012-12-19 MED ORDER — CEFEPIME HCL 1 G IJ SOLR
INTRAMUSCULAR | Status: AC
  Filled 2012-12-19: qty 1

## 2012-12-19 MED ORDER — MORPHINE SULFATE 4 MG/ML IJ SOLN
4.0000 mg | INTRAMUSCULAR | Status: DC | PRN
  Administered 2012-12-19: 4 mg via INTRAVENOUS
  Filled 2012-12-19: qty 1

## 2012-12-19 MED ORDER — ASPIRIN EC 81 MG PO TBEC
81.0000 mg | DELAYED_RELEASE_TABLET | Freq: Every day | ORAL | Status: DC
  Administered 2012-12-19 – 2012-12-21 (×3): 81 mg via ORAL
  Filled 2012-12-19 (×3): qty 1

## 2012-12-19 MED ORDER — MORPHINE SULFATE 2 MG/ML IJ SOLN
2.0000 mg | INTRAMUSCULAR | Status: DC | PRN
  Administered 2012-12-20 – 2012-12-21 (×4): 2 mg via INTRAVENOUS
  Filled 2012-12-19 (×4): qty 1

## 2012-12-19 MED ORDER — ACETAMINOPHEN 650 MG RE SUPP
975.0000 mg | Freq: Once | RECTAL | Status: AC
  Administered 2012-12-19: 975 mg via RECTAL
  Filled 2012-12-19: qty 1

## 2012-12-19 MED ORDER — DEXTROSE 5 % IV SOLN
1.0000 g | Freq: Three times a day (TID) | INTRAVENOUS | Status: DC
  Administered 2012-12-20 – 2012-12-21 (×4): 1 g via INTRAVENOUS
  Filled 2012-12-19 (×5): qty 1

## 2012-12-19 MED ORDER — DEXTROSE 5 % IV SOLN: 1.0000 g | Freq: Three times a day (TID) | INTRAVENOUS | Status: DC

## 2012-12-19 MED ORDER — IOHEXOL 300 MG/ML  SOLN
100.0000 mL | Freq: Once | INTRAMUSCULAR | Status: AC | PRN
  Administered 2012-12-19: 100 mL via INTRAVENOUS

## 2012-12-19 MED ORDER — ONDANSETRON HCL 4 MG PO TABS: 4.0000 mg | ORAL_TABLET | Freq: Four times a day (QID) | ORAL | Status: DC | PRN

## 2012-12-19 MED ORDER — TEMAZEPAM 15 MG PO CAPS
15.0000 mg | ORAL_CAPSULE | Freq: Every evening | ORAL | Status: DC | PRN
  Administered 2012-12-19: 15 mg via ORAL
  Filled 2012-12-19: qty 1

## 2012-12-19 MED ORDER — SERTRALINE HCL 50 MG PO TABS
100.0000 mg | ORAL_TABLET | Freq: Every day | ORAL | Status: DC
Start: 2012-12-20 — End: 2012-12-21
  Administered 2012-12-20 – 2012-12-21 (×2): 100 mg via ORAL
  Filled 2012-12-19 (×2): qty 2

## 2012-12-19 NOTE — ED Notes (Addendum)
Pt c/o LLQ pain since Sunday as well as SOB. Pt is diaphoretic on arrival with temp of 103.0. Pt states his last BM was Monday and he's "barely" eaten since that BM. Pt denies chest pain. Pt also reports headache. Pt denies V/D but reports nausea. Pt had 400mg  of motrin pta.

## 2012-12-19 NOTE — ED Notes (Signed)
Left lower quadrant pain for the past 4 days, getting worse, diaphoretic on arrival

## 2012-12-19 NOTE — ED Provider Notes (Signed)
History     CSN: [id number]  Arrival date & time 12/19/12  1546   First MD Initiated Contact with Patient 12/19/12 1549      Chief Complaint  Patient presents with  . Abdominal Pain     HPI Pt was seen at 1600.   Per pt, c/o gradual onset and persistence of constant LLQ abd "pain" for the past 3 to 4 days.  Has been associated with nausea and home fevers/chills. States he has been taking motrin with partial relief.  Denies vomiting/diarrhea, no back pain, no rash, no CP/SOB, no black or blood in stools, no testicular pain/swelling, no dysuria/hematuria.       Past Medical History  Diagnosis Date  . GERD (gastroesophageal reflux disease)   . Depression   . Hypertension   . Arthritis   . 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 Evette Cristal   . Diverticula of colon 03-10-2004    Dr. Evette Cristal   . Hyperlipemia   . BPH (benign prostatic hypertrophy)   . Chronic abdominal pain     cyclical  . Abdominal migraine     Past Surgical History  Procedure Laterality Date  . Appendectomy    . Fracture surgery    . Shoulder arthroscopy    . Leg surgery    . Arm surgery      Family History  Problem Relation Age of Onset  . Emphysema Father     copd  . Heart disease Mother   . Cancer Mother     mastoid ear  . Lung cancer Mother     History  Substance Use Topics  . Smoking status: Never Smoker   . Smokeless tobacco: Never Used  . Alcohol Use: No      Review of Systems ROS: Statement: All systems negative except as marked or noted in the HPI; Constitutional: +fever and chills. ; ; Eyes: Negative for eye pain, redness and discharge. ; ; ENMT: Negative for ear pain, hoarseness, nasal congestion, sinus pressure and sore throat. ; ; Cardiovascular: Negative for chest pain, palpitations, diaphoresis, dyspnea and peripheral edema. ; ; Respiratory: Negative for cough, wheezing and stridor. ; ; Gastrointestinal: +nausea, abd pain. Negative for vomiting, diarrhea, blood in  stool, hematemesis, jaundice and rectal bleeding. . ; ; Genitourinary: Negative for dysuria, flank pain and hematuria. ; ; Genital:  No penile drainage or rash, no testicular pain or swelling, no scrotal rash or swelling.;; Musculoskeletal: Negative for back pain and neck pain. Negative for swelling and trauma.; ; Skin: Negative for pruritus, rash, abrasions, blisters, bruising and skin lesion.; ; Neuro: Negative for headache, lightheadedness and neck stiffness. Negative for weakness, altered level of consciousness , altered mental status, extremity weakness, paresthesias, involuntary movement, seizure and syncope.       Allergies  Lodine  Home Medications   Current Outpatient Rx  Name  Route  Sig  Dispense  Refill  . aspirin 81 MG EC tablet   Oral   Take 81 mg by mouth daily.           [name] ezetimibe (ZETIA) 10 MG tablet   Oral   Take 1 tablet (10 mg total) by mouth daily.   90 tablet   3   . lisinopril-hydrochlorothiazide (PRINZIDE,ZESTORETIC) 20-12.5 MG per tablet   Oral   Take 1 tablet by mouth daily.   90 tablet   2   . Multiple Vitamins-Minerals (MULTIVITAMIN WITH MINERALS) tablet   Oral   Take  1 tablet by mouth daily.         . Saw Palmetto 80 MG CAPS   Oral   Take 1 capsule by mouth daily.         . tamsulosin (FLOMAX) 0.4 MG CAPS   Oral   Take 1 capsule (0.4 mg total) by mouth 2 (two) times daily.   60 capsule   2   . temazepam (RESTORIL) 15 MG capsule   Oral   Take 15 mg by mouth at bedtime as needed for sleep.         [name] albuterol (PROVENTIL HFA;VENTOLIN HFA) 108 (90 BASE) MCG/ACT inhaler   Inhalation   Inhale 2 puffs into the lungs every 6 (six) hours as needed for wheezing.   1 Inhaler   2   . psyllium (METAMUCIL) 58.6 % powder   Oral   Take 1 packet by mouth daily as needed (Fiber).          . sertraline (ZOLOFT) 100 MG tablet   Oral   Take 100 mg by mouth daily.           BP 123/64  Pulse 66  Temp(Src) 98.8 F (37.1 C) (Oral)   Resp 20  Ht 6\' 1"  (1.854 m)  Wt 295 lb (133.811 kg)  BMI 38.93 kg/m2  SpO2 97%  Physical Exam 1605: Physical examination:  Nursing notes reviewed; Vital signs and O2 SAT reviewed; +febrile;; Constitutional: Well developed, Well nourished, Well hydrated, Uncomfortable appearing; Head:  Normocephalic, atraumatic; Eyes: EOMI, PERRL, No scleral icterus; ENMT: Mouth and pharynx normal, Mucous membranes moist; Neck: Supple, Full range of motion, No lymphadenopathy; Cardiovascular: Regular rate and rhythm, No gallop; Respiratory: Breath sounds clear & equal bilaterally, No rales, rhonchi, wheezes.  Speaking full sentences with ease, Normal respiratory effort/excursion; Chest: Nontender, Movement normal; Abdomen: Soft, +LLQ tenderness to palp. No rebound or guarding. Nondistended, Normal bowel sounds; Rectal exam performed w/permission of pt and ED RN chaperone present.  Anal tone normal.  Non-tender, soft yellow-brown stool in rectal vault, heme neg.  No fissures, no external hemorrhoids, no palp masses.;; Genitourinary: No CVA tenderness; Extremities: Pulses normal, No tenderness, No edema, No calf edema or asymmetry.; Neuro: AA&Ox3, Major CN grossly intact.  Speech clear. No gross focal motor or sensory deficits in extremities.; Skin: Color normal, Warm, diaphoretic.   ED Course  Procedures     MDM  MDM Reviewed: previous chart, nursing note and vitals Reviewed previous: labs and ECG Interpretation: labs, ECG, x-ray and CT scan    Date: 12/19/2012  Rate: 78  Rhythm: normal sinus rhythm  QRS Axis: normal  Intervals: normal  ST/T Wave abnormalities: normal  Conduction Disutrbances:nonspecific intraventricular conduction delay  Narrative Interpretation:   Old EKG Reviewed: unchanged; no significant changes from previous EKG dated 04/22/2009.  Results for orders placed during the hospital encounter of 12/19/12  URINALYSIS, ROUTINE W REFLEX MICROSCOPIC      Result Value Range   Color, Urine  AMBER (*) YELLOW   APPearance CLEAR  CLEAR   Specific Gravity, Urine 1.010  1.005 - 1.030   pH 6.0  5.0 - 8.0   Glucose, UA NEGATIVE  NEGATIVE mg/dL   Hgb urine dipstick LARGE (*) NEGATIVE   Bilirubin Urine NEGATIVE  NEGATIVE   Ketones, ur NEGATIVE  NEGATIVE mg/dL   Protein, ur TRACE (*) NEGATIVE mg/dL   Urobilinogen, UA 1.0  0.0 - 1.0 mg/dL   Nitrite NEGATIVE  NEGATIVE   Leukocytes, UA NEGATIVE  NEGATIVE  CBC  WITH DIFFERENTIAL      Result Value Range   WBC 8.6  4.0 - 10.5 K/uL   RBC 5.10  4.22 - 5.81 MIL/uL   Hemoglobin 14.3  13.0 - 17.0 g/dL   HCT 16.1  09.6 - 04.5 %   MCV 80.8  78.0 - 100.0 fL   MCH 28.0  26.0 - 34.0 pg   MCHC 34.7  30.0 - 36.0 g/dL   RDW 40.9  81.1 - 91.4 %   Platelets 189  150 - 400 K/uL   Neutrophils Relative % 77  43 - 77 %   Neutro Abs 6.7  1.7 - 7.7 K/uL   Lymphocytes Relative 10 (*) 12 - 46 %   Lymphs Abs 0.9  0.7 - 4.0 K/uL   Monocytes Relative 12  3 - 12 %   Monocytes Absolute 1.0  0.1 - 1.0 K/uL   Eosinophils Relative 1  0 - 5 %   Eosinophils Absolute 0.1  0.0 - 0.7 K/uL   Basophils Relative 0  0 - 1 %   Basophils Absolute 0.0  0.0 - 0.1 K/uL  COMPREHENSIVE METABOLIC PANEL      Result Value Range   Sodium 128 (*) 135 - 145 mEq/L   Potassium 3.3 (*) 3.5 - 5.1 mEq/L   Chloride 94 (*) 96 - 112 mEq/L   CO2 23  19 - 32 mEq/L   Glucose, Bld 131 (*) 70 - 99 mg/dL   BUN 20  6 - 23 mg/dL   Creatinine, Ser 7.82  0.50 - 1.35 mg/dL   Calcium 9.0  8.4 - 95.6 mg/dL   Total Protein 6.8  6.0 - 8.3 g/dL   Albumin 3.2 (*) 3.5 - 5.2 g/dL   AST 66 (*) 0 - 37 U/L   ALT 87 (*) 0 - 53 U/L   Alkaline Phosphatase 80  39 - 117 U/L   Total Bilirubin 1.0  0.3 - 1.2 mg/dL   GFR calc non Af Amer 67 (*) >90 mL/min   GFR calc Af Amer 77 (*) >90 mL/min  LIPASE, BLOOD      Result Value Range   Lipase 20  11 - 59 U/L  LACTIC ACID, PLASMA      Result Value Range   Lactic Acid, Venous 0.9  0.5 - 2.2 mmol/L  TROPONIN I      Result Value Range   Troponin I <0.30   <0.30 ng/mL  URINE MICROSCOPIC-ADD ON      Result Value Range   WBC, UA 0-2  <3 WBC/hpf   Bacteria, UA RARE  RARE   Urine-Other MUCOUS PRESENT     Dg Chest 2 View 12/19/2012   *RADIOLOGY REPORT*  Clinical Data:  hypertension and fever.  Shortness of breath.  CHEST - 2 VIEW  Comparison: 10/31/2012  Findings: Heart size is normal.  There is no pleural effusion or edema identified.  Asymmetric opacity within the lingular portion of the left lung is identified and is concerning for infection. Mild spondylosis identified within the thoracic spine.  IMPRESSION:  1. Lingular opacities worrisome for pneumonia.   Original Report Authenticated By: Signa Kell, M.D.   Ct Abdomen Pelvis W Contrast 12/19/2012   *RADIOLOGY REPORT*  Clinical Data: Left lower quadrant pain.  Nausea.  Fever.  History of diverticulitis.  CT ABDOMEN AND PELVIS WITH CONTRAST  Technique:  Multidetector CT imaging of the abdomen and pelvis was performed following the standard protocol during bolus administration of intravenous contrast.  Contrast:  OMNIPAQUE IOHEXOL 300 MG/ML  SOLN  Comparison: 01/15/2004  Findings: Images of the lung bases show airspace disease in the visualized portion of the lingula, suspicious for pneumonia.  The liver, gallbladder, pancreas, and kidneys are normal appearance.  No evidence of hydronephrosis.  A benign right adrenal adenoma remains stable.  Borderline splenomegaly is also stable. Spleen measures 13-14 cm length.  No soft tissue masses or lymphadenopathy identified within the abdomen or pelvis.  No evidence of inflammatory process or abnormal fluid collections. No evidence of bowel wall thickening or dilatation.  A small periumbilical hernia is seen containing only fat.  There is no evidence of herniated bowel loops.  IMPRESSION:  1.  Lingular air space disease, suspicious for pneumonia. 2.  No acute intra-abdominal or pelvic findings. 3.  Small periumbilical hernia containing only fat. 4.  Stable  benign right adrenal adenoma and borderline splenomegaly.   Original Report Authenticated By: Myles Rosenthal, M.D.    Results for ASHTYN, FREILICH (MRN [id number]) as of 12/19/2012 18:59  Ref. Range 02/08/2011 11:17 06/20/2011 09:09 02/08/2012 10:59 10/24/2012 08:15 12/19/2012 16:02  Sodium Latest Range: 135-145 mEq/L 138 138 134 (L) 136 128 (L)    Results for JAYSEN, WEY (MRN [id number]) as of 12/19/2012 18:59  Ref. Range 11/09/2009 18:29 06/20/2011 09:09 02/08/2012 10:59 12/19/2012 16:02  AST Latest Range: 0-37 U/L 18 16 19  66 (H)  ALT Latest Range: 0-53 U/L 32 19 28 87 (H)     1730:  Fever improved after APAP.  Will start HCAP abx (pt's last hospital admit was 09/2012).  New hyponatremia and mildly elevated LFT's today.  Dx and testing d/w pt.  Questions answered.  Verb understanding, agreeable to admit.  T/C to [facility] Dr. Dutch Quint, case discussed, including:  HPI, pertinent PM/SHx, VS/PE, dx testing, ED course and treatment:  Agreeable to admit, requests to write temporary orders, obtain medical bed to team 2.            Laray Anger, DO 12/21/12 1818

## 2012-12-19 NOTE — H&P (Signed)
[facility] History and Physical  [name] [name] ZOX:[id number] DOB: 01-Sep-1945 DOA: 12/19/2012   PCP: Majel Homer, MD  Specialists: None  Chief Complaint: Fever, and abdominal pain since Sunday  HPI: [name] [name] is a 67 y.o. male with a past medical history of hypertension, benign prostatic hypertrophy, history of diverticulosis and diverticulitis, who was in his usual state of health till Sunday, when he started having abdominal pain in the left lower quadrant. The pain was 9/10 in intensity. There was no radiation. There was no precipitating, aggravating or relieving factors. Denies any diarrhea. Had some nausea without any vomiting. He also has been having a dry cough. But denies any shortness of breath or chest pain. Had a fever up to 103 today and was 101 yesterday. He's had generalized weakness and has been dizzy, but denies any syncopal episodes. Admits to poor oral intake. His dose was Flomax was recently increased to twice a day from once a day about 2 weeks ago. Denies any recent travel. No sick contacts otherwise.  Home Medications: Prior to Admission medications   Medication Sig Start Date End Date Taking? Authorizing Provider  aspirin 81 MG EC tablet Take 81 mg by mouth daily.     Yes Historical Provider, MD  ezetimibe (ZETIA) 10 MG tablet Take 1 tablet (10 mg total) by mouth daily. 07/13/12  Yes Brent Bulla, MD  lisinopril-hydrochlorothiazide (PRINZIDE,ZESTORETIC) 20-12.5 MG per tablet Take 1 tablet by mouth daily. 12/03/12  Yes Brent Bulla, MD  Multiple Vitamins-Minerals (MULTIVITAMIN WITH MINERALS) tablet Take 1 tablet by mouth daily.   Yes Historical Provider, MD  Saw Palmetto 80 MG CAPS Take 1 capsule by mouth daily.   Yes Historical Provider, MD  tamsulosin (FLOMAX) 0.4 MG CAPS Take 1 capsule (0.4 mg total) by mouth 2 (two) times daily. 12/05/12  Yes Brent Bulla, MD  temazepam (RESTORIL) 15 MG capsule Take 15 mg by mouth at bedtime as needed for sleep.   Yes  Historical Provider, MD  albuterol (PROVENTIL HFA;VENTOLIN HFA) 108 (90 BASE) MCG/ACT inhaler Inhale 2 puffs into the lungs every 6 (six) hours as needed for wheezing. 10/25/12   Gwenyth Bender, NP  psyllium (METAMUCIL) 58.6 % powder Take 1 packet by mouth daily as needed (Fiber).     Historical Provider, MD  sertraline (ZOLOFT) 100 MG tablet Take 100 mg by mouth daily.    Historical Provider, MD    Allergies:  Allergies  Allergen Reactions  . Lodine (Etodolac) Nausea And Vomiting and Other (See Comments)    Internal Bleeding.     Past Medical History: Past Medical History  Diagnosis Date  . GERD (gastroesophageal reflux disease)   . Depression   . Hypertension   . Arthritis   . 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 Evette Cristal   . Diverticula of colon 03-10-2004    Dr. Evette Cristal   . Hyperlipemia   . BPH (benign prostatic hypertrophy)   . Chronic abdominal pain     cyclical  . Abdominal migraine     Past Surgical History  Procedure Laterality Date  . Appendectomy    . Fracture surgery    . Shoulder arthroscopy    . Leg surgery    . Arm surgery      Social History:  reports that he has never smoked. He has never used smokeless tobacco. He reports that he does not drink alcohol or use illicit drugs.  Living Situation: Lives alone Activity Level: Independent  with daily activities   Family History:  Family History  Problem Relation Age of Onset  . Emphysema Father     copd  . Heart disease Mother   . Cancer Mother     mastoid ear  . Lung cancer Mother      Review of Systems - History obtained from the patient General ROS: positive for  - fatigue Psychological ROS: negative Ophthalmic ROS: negative ENT ROS: negative Allergy and Immunology ROS: negative Hematological and Lymphatic ROS: negative Endocrine ROS: negative Respiratory ROS: positive for - cough Cardiovascular ROS: no chest pain or dyspnea on exertion Gastrointestinal ROS: as in  hpi Genito-Urinary ROS: no dysuria, trouble voiding, or hematuria Musculoskeletal ROS: negative Neurological ROS: no TIA or stroke symptoms Dermatological ROS: negative  Physical Examination  Filed Vitals:   12/19/12 1554 12/19/12 1804 12/19/12 1809 12/19/12 2034  BP: 148/74  123/64 118/67  Pulse: 78  66 65  Temp: 103 F (39.4 C) 98.8 F (37.1 C)  97.4 F (36.3 C)  TempSrc: Rectal Oral  Oral  Resp:   20 20  Height:    6\' 1"  (1.854 m)  Weight:    138.4 kg (305 lb 1.9 oz)  SpO2: 96%  97% 97%    General appearance: alert, cooperative, appears stated age, no distress and moderately obese Head: Normocephalic, without obvious abnormality, atraumatic Eyes: conjunctivae/corneas clear. PERRL, EOM's intact. Throat: lips, mucosa, and tongue normal; teeth and gums normal Neck: no adenopathy, no carotid bruit, no JVD, supple, symmetrical, trachea midline and thyroid not enlarged, symmetric, no tenderness/mass/nodules Back: symmetric, no curvature. ROM normal. No CVA tenderness. Resp: clear to auscultation bilaterally Cardio: regular rate and rhythm, S1, S2 normal, no murmur, click, rub or gallop GI: soft, non-tender; bowel sounds normal; no masses,  no organomegaly Extremities: extremities normal, atraumatic, no cyanosis or edema Pulses: 2+ and symmetric Skin: Skin color, texture, turgor normal. No rashes or lesions Lymph nodes: Cervical, supraclavicular, and axillary nodes normal. Neurologic: He is alert and oriented x3. No focal neurological deficits are present  Laboratory Data: Results for orders placed during the hospital encounter of 12/19/12 (from the past 48 hour(s))  CBC WITH DIFFERENTIAL     Status: Abnormal   Collection Time    12/19/12  4:02 PM      Result Value Range   WBC 8.6  4.0 - 10.5 K/uL   RBC 5.10  4.22 - 5.81 MIL/uL   Hemoglobin 14.3  13.0 - 17.0 g/dL   HCT 45.4  09.8 - 11.9 %   MCV 80.8  78.0 - 100.0 fL   MCH 28.0  26.0 - 34.0 pg   MCHC 34.7  30.0 - 36.0  g/dL   RDW 14.7  82.9 - 56.2 %   Platelets 189  150 - 400 K/uL   Neutrophils Relative % 77  43 - 77 %   Neutro Abs 6.7  1.7 - 7.7 K/uL   Lymphocytes Relative 10 (*) 12 - 46 %   Lymphs Abs 0.9  0.7 - 4.0 K/uL   Monocytes Relative 12  3 - 12 %   Monocytes Absolute 1.0  0.1 - 1.0 K/uL   Eosinophils Relative 1  0 - 5 %   Eosinophils Absolute 0.1  0.0 - 0.7 K/uL   Basophils Relative 0  0 - 1 %   Basophils Absolute 0.0  0.0 - 0.1 K/uL  COMPREHENSIVE METABOLIC PANEL     Status: Abnormal   Collection Time    12/19/12  4:02  PM      Result Value Range   Sodium 128 (*) 135 - 145 mEq/L   Potassium 3.3 (*) 3.5 - 5.1 mEq/L   Chloride 94 (*) 96 - 112 mEq/L   CO2 23  19 - 32 mEq/L   Glucose, Bld 131 (*) 70 - 99 mg/dL   BUN 20  6 - 23 mg/dL   Creatinine, Ser 1.61  0.50 - 1.35 mg/dL   Calcium 9.0  8.4 - 09.6 mg/dL   Total Protein 6.8  6.0 - 8.3 g/dL   Albumin 3.2 (*) 3.5 - 5.2 g/dL   AST 66 (*) 0 - 37 U/L   ALT 87 (*) 0 - 53 U/L   Alkaline Phosphatase 80  39 - 117 U/L   Total Bilirubin 1.0  0.3 - 1.2 mg/dL   GFR calc non Af Amer 67 (*) >90 mL/min   GFR calc Af Amer 77 (*) >90 mL/min   Comment:            The eGFR has been calculated     using the CKD EPI equation.     This calculation has not been     validated in all clinical     situations.     eGFR's persistently     <90 mL/min signify     possible Chronic Kidney Disease.  LIPASE, BLOOD     Status: None   Collection Time    12/19/12  4:02 PM      Result Value Range   Lipase 20  11 - 59 U/L  TROPONIN I     Status: None   Collection Time    12/19/12  4:02 PM      Result Value Range   Troponin I <0.30  <0.30 ng/mL   Comment:            Due to the release kinetics of cTnI,     a negative result within the first hours     of the onset of symptoms does not rule out     myocardial infarction with certainty.     If myocardial infarction is still suspected,     repeat the test at appropriate intervals.  LACTIC ACID, PLASMA      Status: None   Collection Time    12/19/12  4:05 PM      Result Value Range   Lactic Acid, Venous 0.9  0.5 - 2.2 mmol/L  URINALYSIS, ROUTINE W REFLEX MICROSCOPIC     Status: Abnormal   Collection Time    12/19/12  5:56 PM      Result Value Range   Color, Urine AMBER (*) YELLOW   Comment: BIOCHEMICALS MAY BE AFFECTED BY COLOR   APPearance CLEAR  CLEAR   Specific Gravity, Urine 1.010  1.005 - 1.030   pH 6.0  5.0 - 8.0   Glucose, UA NEGATIVE  NEGATIVE mg/dL   Hgb urine dipstick LARGE (*) NEGATIVE   Bilirubin Urine NEGATIVE  NEGATIVE   Ketones, ur NEGATIVE  NEGATIVE mg/dL   Protein, ur TRACE (*) NEGATIVE mg/dL   Urobilinogen, UA 1.0  0.0 - 1.0 mg/dL   Nitrite NEGATIVE  NEGATIVE   Leukocytes, UA NEGATIVE  NEGATIVE  URINE MICROSCOPIC-ADD ON     Status: None   Collection Time    12/19/12  5:56 PM      Result Value Range   WBC, UA 0-2  <3 WBC/hpf   Bacteria, UA RARE  RARE   Urine-Other MUCOUS  PRESENT      Radiology Reports: Dg Chest 2 View  12/19/2012   *RADIOLOGY REPORT*  Clinical Data:  hypertension and fever.  Shortness of breath.  CHEST - 2 VIEW  Comparison: 10/31/2012  Findings: Heart size is normal.  There is no pleural effusion or edema identified.  Asymmetric opacity within the lingular portion of the left lung is identified and is concerning for infection. Mild spondylosis identified within the thoracic spine.  IMPRESSION:  1. Lingular opacities worrisome for pneumonia.   Original Report Authenticated By: Signa Kell, M.D.   Ct Abdomen Pelvis W Contrast  12/19/2012   *RADIOLOGY REPORT*  Clinical Data: Left lower quadrant pain.  Nausea.  Fever.  History of diverticulitis.  CT ABDOMEN AND PELVIS WITH CONTRAST  Technique:  Multidetector CT imaging of the abdomen and pelvis was performed following the standard protocol during bolus administration of intravenous contrast.  Contrast: OMNIPAQUE IOHEXOL 300 MG/ML  SOLN  Comparison: 01/15/2004  Findings: Images of the lung bases  show airspace disease in the visualized portion of the lingula, suspicious for pneumonia.  The liver, gallbladder, pancreas, and kidneys are normal appearance.  No evidence of hydronephrosis.  A benign right adrenal adenoma remains stable.  Borderline splenomegaly is also stable. Spleen measures 13-14 cm length.  No soft tissue masses or lymphadenopathy identified within the abdomen or pelvis.  No evidence of inflammatory process or abnormal fluid collections. No evidence of bowel wall thickening or dilatation.  A small periumbilical hernia is seen containing only fat.  There is no evidence of herniated bowel loops.  IMPRESSION:  1.  Lingular air space disease, suspicious for pneumonia. 2.  No acute intra-abdominal or pelvic findings. 3.  Small periumbilical hernia containing only fat. 4.  Stable benign right adrenal adenoma and borderline splenomegaly.   Original Report Authenticated By: Myles Rosenthal, M.D.    Electrocardiogram: Sinus rhythm at 70 beats a minute. Normal axis. No Q waves. No concerning ST or T-wave changes. Intervals are normal.  Problem List  Active Problems:   HYPERTENSION, BENIGN ESSENTIAL   GASTROESOPHAGEAL REFLUX, NO ESOPHAGITIS   BPH (benign prostatic hyperplasia)   HCAP (healthcare-associated pneumonia)   Elevated LFTs   Hyponatremia   Fever   LLQ abdominal pain   Assessment: This is a 67 year old, Caucasian male, with a past medical history as stated earlier, who presents with fever and left lower cord, and abdominal pain. CT of his abdomen Pelvis was unremarkable for any abdominal findings. However, he was found to have findings suggestive of pneumonia. He also has transaminitis and hyponatremia  Plan: #1 healthcare associated pneumonia: He'll be treated with vancomycin and cefepime. Because of the hyponatremia and the abnormal LFTs Legionella is to be considered, and he'll be put on Levaquin as well. Blood cultures will be obtained.  #2 left lower quadrant abdominal  pain: CT was unremarkable. He is currently pain-free. Continue to monitor.  #3 transaminitis: Continue to trend LFTs. Hepatitis panel be checked. CT does not show any discrete lesions in the liver.  #4 hyponatremia: IV fluids will be provided.  #5 history of hypertension: Hold his diuretics. Hydralazine as needed.  DVT Prophylaxis: Lovenox Code Status: He is a full code Family Communication: No family at bedside  Disposition Plan: Admit to MedSurg bed.   Further management decisions will depend on results of further testing and patient's response to treatment.  [place]  [facility] Pager [phone]  If 7PM-7AM, please contact night-coverage [email or web address] Password [facility]  12/19/2012, 8:42 PM

## 2012-12-19 NOTE — Telephone Encounter (Signed)
Pt is feeling lousy/headache/stomach problems/can't eat or drink

## 2012-12-19 NOTE — Progress Notes (Signed)
ANTIBIOTIC CONSULT NOTE - INITIAL  Pharmacy Consult for Vancomycin & Cefepime Indication: pneumonia  Allergies  Allergen Reactions  . Lodine (Etodolac) Nausea And Vomiting and Other (See Comments)    Internal Bleeding.     Patient Measurements: Height: 6\' 1"  (185.4 cm) Weight: 295 lb (133.811 kg) IBW/kg (Calculated) : 79.9  Vital Signs: Temp: 98.8 F (37.1 C) (05/21 1804) Temp src: Oral (05/21 1804) BP: 123/64 mmHg (05/21 1809) Pulse Rate: 66 (05/21 1809) Intake/Output from previous day:   Intake/Output from this shift:    Labs:  Recent Labs  12/19/12 1602  WBC 8.6  HGB 14.3  PLT 189  CREATININE 1.12   Estimated Creatinine Clearance: 93.1 ml/min (by C-G formula based on Cr of 1.12). No results found for this basename: VANCOTROUGH, VANCOPEAK, VANCORANDOM, GENTTROUGH, GENTPEAK, GENTRANDOM, TOBRATROUGH, TOBRAPEAK, TOBRARND, AMIKACINPEAK, AMIKACINTROU, AMIKACIN,  in the last 72 hours   Microbiology: No results found for this or any previous visit (from the past 720 hour(s)).  Medical History: Past Medical History  Diagnosis Date  . GERD (gastroesophageal reflux disease)   . Depression   . Hypertension   . Arthritis   . 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 Evette Cristal   . Diverticula of colon 03-10-2004    Dr. Evette Cristal   . Hyperlipemia   . BPH (benign prostatic hypertrophy)   . Chronic abdominal pain     cyclical  . Abdominal migraine     Medications:  Scheduled:    Assessment: 67 yo obese M presents with PNA to receive empiric, broad-spectrum antibiotics with Vancomycin & Cefepime.  He was febrile on admission.  Renal function is at patient's baseline  Goal of Therapy:  Vancomycin trough level 15-20 mcg/ml  Plan:  Vancomycin 2gm IV load x1 now then 1250mg  IV Q12h Check Vancomycin trough at steady state Change Cefepime to 2gm IV load.  Recommend maintenance dose of 2gm IV Q8h. Monitor renal function and cx data   Elson Clan 12/19/2012,6:36 PM

## 2012-12-19 NOTE — Telephone Encounter (Signed)
C/o "head feels like it will explode when I cough."  C/o abd pain and nausea.  Denies any vomiting.  Has an appt tomorrow morning with Dr. Louanne Belton, but requesting to be seen today if possible.  No appts available today and offered to work patient in this afternoon or go to urgent care/ED this evening.  Patient stated "I don't think I can sit around waiting because I don't feel good."  Will keep his appt tomorrow morning and call back or go to ED/urgent care if symptoms worsen.  Gaylene Brooks, RN

## 2012-12-20 ENCOUNTER — Ambulatory Visit: Payer: Medicare HMO | Admitting: Pharmacist

## 2012-12-20 LAB — URINE CULTURE
Colony Count: NO GROWTH
Culture: NO GROWTH

## 2012-12-20 LAB — CBC
HCT: 41.3 % (ref 39.0–52.0)
MCHC: 33.7 g/dL (ref 30.0–36.0)
Platelets: 167 10*3/uL (ref 150–400)
RDW: 15 % (ref 11.5–15.5)

## 2012-12-20 LAB — PROTIME-INR: INR: 1.27 (ref 0.00–1.49)

## 2012-12-20 LAB — COMPREHENSIVE METABOLIC PANEL
ALT: 69 U/L — ABNORMAL HIGH (ref 0–53)
Alkaline Phosphatase: 77 U/L (ref 39–117)
CO2: 27 mEq/L (ref 19–32)
Chloride: 96 mEq/L (ref 96–112)
GFR calc Af Amer: 85 mL/min — ABNORMAL LOW (ref >90)
GFR calc non Af Amer: 74 mL/min — ABNORMAL LOW (ref >90)
Glucose, Bld: 126 mg/dL — ABNORMAL HIGH (ref 70–99)
Potassium: 3.7 mEq/L (ref 3.5–5.1)
Sodium: 131 mEq/L — ABNORMAL LOW (ref 135–145)
Total Bilirubin: 1.2 mg/dL (ref 0.3–1.2)
Total Protein: 6.4 g/dL (ref 6.0–8.3)

## 2012-12-20 LAB — HIV ANTIBODY (ROUTINE TESTING W REFLEX): HIV: NONREACTIVE

## 2012-12-20 MED ORDER — DICYCLOMINE HCL 20 MG PO TABS
20.0000 mg | ORAL_TABLET | Freq: Three times a day (TID) | ORAL | Status: DC
  Filled 2012-12-20 (×4): qty 1

## 2012-12-20 MED ORDER — ACETAMINOPHEN 325 MG PO TABS
650.0000 mg | ORAL_TABLET | Freq: Four times a day (QID) | ORAL | Status: DC | PRN
  Administered 2012-12-20 – 2012-12-21 (×2): 650 mg via ORAL
  Filled 2012-12-20 (×2): qty 2

## 2012-12-20 MED ORDER — GUAIFENESIN ER 600 MG PO TB12
1200.0000 mg | ORAL_TABLET | Freq: Two times a day (BID) | ORAL | Status: DC
  Administered 2012-12-20 – 2012-12-21 (×3): 1200 mg via ORAL
  Filled 2012-12-20 (×3): qty 2

## 2012-12-20 MED ORDER — BENZONATATE 100 MG PO CAPS
200.0000 mg | ORAL_CAPSULE | Freq: Three times a day (TID) | ORAL | Status: DC
  Administered 2012-12-20 – 2012-12-21 (×4): 200 mg via ORAL
  Filled 2012-12-20 (×4): qty 2

## 2012-12-20 MED ORDER — DICYCLOMINE HCL 10 MG PO CAPS
20.0000 mg | ORAL_CAPSULE | Freq: Three times a day (TID) | ORAL | Status: DC
  Administered 2012-12-20 – 2012-12-21 (×3): 20 mg via ORAL
  Filled 2012-12-20 (×3): qty 2

## 2012-12-20 NOTE — Progress Notes (Signed)
UR chart review completed.  

## 2012-12-20 NOTE — Progress Notes (Signed)
[facility] PROGRESS NOTE  [name] [name] ZOX:[id number] DOB: 11/06/1945 DOA: 12/19/2012 PCP: Majel Homer, MD  Assessment/Plan: 1. Pneumonia. Since patient was recently in the hospital, he will be treated as a healthcare associated pneumonia. He is on vancomycin and cefepime. Due to concerns for possible underlying Legionella, he's also been started on Levaquin. We'll continue IV antibiotics for now. If he does not have any further fevers, he can be transitioned to by mouth antibiotics. Continue pulmonary hygiene. Wean down oxygen as tolerated 2. Chronic abdominal pain. Patient reports being diagnosed with "abdominal migraines" he reports his left lower quadrant pain is chronic pain. We'll continue current medications. CT scan of the abdomen and pelvis was unremarkable. 3. Hyponatremia. Likely related to volume depletion. Improved with IV fluids. 4. Elevated liver function tests. Likely reactive to acute illness. Hepatitis panel is being checked. 5. History of hypertension. Stable  Code Status: full code Family Communication: discussed with patient Disposition Plan: discharge home once improved   Consultants:  none  Procedures:  none  Antibiotics:  Vancomycin 5/21  Cefepime 5/21  Levofloxacin 5/21  HPI/Subjective: Has headache with coughing episodes, has continued LLQ pain  Objective: Filed Vitals:   12/19/12 2034 12/20/12 0300 12/20/12 0522 12/20/12 0807  BP: 118/67  122/72   Pulse: 65  63 62  Temp: 97.4 F (36.3 C)  99.2 F (37.3 C)   TempSrc: Oral  Oral   Resp: 20  20 18   Height: 6\' 1"  (1.854 m)     Weight: 138.4 kg (305 lb 1.9 oz)  138.7 kg (305 lb 12.5 oz)   SpO2: 97% 97% 95% 96%    Intake/Output Summary (Last 24 hours) at 12/20/12 1149 Last data filed at 12/20/12 0500  Gross per 24 hour  Intake 1842.34 ml  Output    850 ml  Net 992.34 ml   Filed Weights   12/19/12 1550 12/19/12 2034 12/20/12 0522  Weight: 133.811 kg (295 lb) 138.4 kg (305 lb 1.9  oz) 138.7 kg (305 lb 12.5 oz)    Exam:   General:  NAD  Cardiovascular: S1, S2, RRR  Respiratory: occasional rhonchi bilaterally  Abdomen: soft, obese, nt, bs+  Musculoskeletal: no pedal edema b/l   Data Reviewed: Basic Metabolic Panel:  Recent Labs Lab 12/19/12 1602 12/20/12 0517  NA 128* 131*  K 3.3* 3.7  CL 94* 96  CO2 23 27  GLUCOSE 131* 126*  BUN 20 15  CREATININE 1.12 1.03  CALCIUM 9.0 8.5   Liver Function Tests:  Recent Labs Lab 12/19/12 1602 12/20/12 0517  AST 66* 36  ALT 87* 69*  ALKPHOS 80 77  BILITOT 1.0 1.2  PROT 6.8 6.4  ALBUMIN 3.2* 2.9*    Recent Labs Lab 12/19/12 1602  LIPASE 20   No results found for this basename: AMMONIA,  in the last 168 hours CBC:  Recent Labs Lab 12/19/12 1602 12/20/12 0517  WBC 8.6 5.8  NEUTROABS 6.7  --   HGB 14.3 13.9  HCT 41.2 41.3  MCV 80.8 82.4  PLT 189 167   Cardiac Enzymes:  Recent Labs Lab 12/19/12 1602 12/19/12 2058  CKTOTAL  --  34  CKMB  --  1.6  TROPONINI <0.30  --    BNP (last 3 results)  Recent Labs  10/24/12 0815  PROBNP 14.4   CBG: No results found for this basename: GLUCAP,  in the last 168 hours  Recent Results (from the past 240 hour(s))  CULTURE, BLOOD (ROUTINE X 2)  Status: None   Collection Time    12/19/12  8:43 PM      Result Value Range Status   Specimen Description BLOOD LEFT ARM   Final   Special Requests BOTTLES DRAWN AEROBIC AND ANAEROBIC 8CC EACH   Final   Culture NO GROWTH 1 DAY   Final   Report Status PENDING   Incomplete  CULTURE, BLOOD (ROUTINE X 2)     Status: None   Collection Time    12/19/12  8:58 PM      Result Value Range Status   Specimen Description RIGHT ANTECUBITAL   Final   Special Requests BOTTLES DRAWN AEROBIC AND ANAEROBIC 8CC EACH   Final   Culture NO GROWTH 1 DAY   Final   Report Status PENDING   Incomplete     Studies: Dg Chest 2 View  12/19/2012   *RADIOLOGY REPORT*  Clinical Data:  hypertension and fever.  Shortness  of breath.  CHEST - 2 VIEW  Comparison: 10/31/2012  Findings: Heart size is normal.  There is no pleural effusion or edema identified.  Asymmetric opacity within the lingular portion of the left lung is identified and is concerning for infection. Mild spondylosis identified within the thoracic spine.  IMPRESSION:  1. Lingular opacities worrisome for pneumonia.   Original Report Authenticated By: Signa Kell, M.D.   Ct Abdomen Pelvis W Contrast  12/19/2012   *RADIOLOGY REPORT*  Clinical Data: Left lower quadrant pain.  Nausea.  Fever.  History of diverticulitis.  CT ABDOMEN AND PELVIS WITH CONTRAST  Technique:  Multidetector CT imaging of the abdomen and pelvis was performed following the standard protocol during bolus administration of intravenous contrast.  Contrast: OMNIPAQUE IOHEXOL 300 MG/ML  SOLN  Comparison: 01/15/2004  Findings: Images of the lung bases show airspace disease in the visualized portion of the lingula, suspicious for pneumonia.  The liver, gallbladder, pancreas, and kidneys are normal appearance.  No evidence of hydronephrosis.  A benign right adrenal adenoma remains stable.  Borderline splenomegaly is also stable. Spleen measures 13-14 cm length.  No soft tissue masses or lymphadenopathy identified within the abdomen or pelvis.  No evidence of inflammatory process or abnormal fluid collections. No evidence of bowel wall thickening or dilatation.  A small periumbilical hernia is seen containing only fat.  There is no evidence of herniated bowel loops.  IMPRESSION:  1.  Lingular air space disease, suspicious for pneumonia. 2.  No acute intra-abdominal or pelvic findings. 3.  Small periumbilical hernia containing only fat. 4.  Stable benign right adrenal adenoma and borderline splenomegaly.   Original Report Authenticated By: Myles Rosenthal, M.D.    Scheduled Meds: . sodium chloride   Intravenous STAT  . aspirin EC  81 mg Oral Daily  . benzonatate  200 mg Oral TID  . ceFEPime  (MAXIPIME) IV  1 g Intravenous Q8H  . dicyclomine  20 mg Oral TID AC  . enoxaparin (LOVENOX) injection  70 mg Subcutaneous Q24H  . ezetimibe  10 mg Oral Daily  . guaiFENesin  1,200 mg Oral BID  . levofloxacin (LEVAQUIN) IV  500 mg Intravenous Q24H  . sertraline  100 mg Oral Daily  . vancomycin  1,250 mg Intravenous Q12H   Continuous Infusions:   Active Problems:   HYPERTENSION, BENIGN ESSENTIAL   GASTROESOPHAGEAL REFLUX, NO ESOPHAGITIS   BPH (benign prostatic hyperplasia)   HCAP (healthcare-associated pneumonia)   Elevated LFTs   Hyponatremia   Fever   LLQ abdominal pain  Time spent:    [facility]  [facility] Pager [phone]. If 7PM-7AM, please contact night-coverage at [email or web address], password [facility] 12/20/2012, 11:49 AM  LOS: 1 day

## 2012-12-21 DIAGNOSIS — I10 Essential (primary) hypertension: Secondary | ICD-10-CM

## 2012-12-21 DIAGNOSIS — E871 Hypo-osmolality and hyponatremia: Secondary | ICD-10-CM

## 2012-12-21 DIAGNOSIS — E669 Obesity, unspecified: Secondary | ICD-10-CM

## 2012-12-21 LAB — COMPREHENSIVE METABOLIC PANEL
ALT: 88 U/L — ABNORMAL HIGH (ref 0–53)
AST: 52 U/L — ABNORMAL HIGH (ref 0–37)
Albumin: 2.6 g/dL — ABNORMAL LOW (ref 3.5–5.2)
Alkaline Phosphatase: 78 U/L (ref 39–117)
Calcium: 8.7 mg/dL (ref 8.4–10.5)
GFR calc Af Amer: 83 mL/min — ABNORMAL LOW (ref >90)
Potassium: 4.1 mEq/L (ref 3.5–5.1)
Sodium: 134 mEq/L — ABNORMAL LOW (ref 135–145)
Total Protein: 6.1 g/dL (ref 6.0–8.3)

## 2012-12-21 LAB — CBC
MCH: 27.8 pg (ref 26.0–34.0)
MCHC: 33.6 g/dL (ref 30.0–36.0)
Platelets: 174 10*3/uL (ref 150–400)
RDW: 15.1 % (ref 11.5–15.5)

## 2012-12-21 LAB — HEPATITIS PANEL, ACUTE: HCV Ab: NEGATIVE

## 2012-12-21 MED ORDER — BENZONATATE 200 MG PO CAPS: 200.0000 mg | ORAL_CAPSULE | Freq: Three times a day (TID) | ORAL | Status: DC

## 2012-12-21 MED ORDER — LEVOFLOXACIN 500 MG PO TABS: 500.0000 mg | ORAL_TABLET | Freq: Every day | ORAL | Status: AC

## 2012-12-21 MED ORDER — GUAIFENESIN ER 600 MG PO TB12: 1200.0000 mg | ORAL_TABLET | Freq: Two times a day (BID) | ORAL | Status: DC

## 2012-12-21 NOTE — Progress Notes (Signed)
Patient with orders to be discharge home. Discharge instructions given, patient verbalized understanding. Patient in stable condition upon discharge. Patient left with a friend via private vehicle.

## 2012-12-21 NOTE — Discharge Summary (Signed)
Physician Discharge Summary  [name] [name] JYN:[id number] DOB: Mar 23, 1946 DOA: 12/19/2012  PCP: [name] Homer, MD  Admit date: 12/19/2012 Discharge date: 12/21/2012  Time spent: Greater than 30 minutes  Recommendations for Outpatient Follow-up:  1. Follow with primary care physician in 2 weeks' time. Will need repeat chest x-ray in approximately 4-6 weeks' time.   Discharge Diagnoses:  1. Lingular pneumonia, improving. 2. Hypertension. 3. Elevated liver enzymes, will need to followup. Hepatitis serology pending. 4. Hyponatremia secondary to hypovolemia, improved. 5. Obesity.   Discharge Condition: Stable and improved.  Diet recommendation: Low glycemic index nutrition.  Filed Weights   12/19/12 1550 12/19/12 2034 12/20/12 0522  Weight: 133.811 kg (295 lb) 138.4 kg (305 lb 1.9 oz) 138.7 kg (305 lb 12.5 oz)    History of present illness:  This very pleasant 67 year old man presents to the hospital with symptoms of fever and dry cough. Please see initial history as outlined below: HPI: [name] [name] is a 67 y.o. male with a past medical history of hypertension, benign prostatic hypertrophy, history of diverticulosis and diverticulitis, who was in his usual state of health till Sunday, when he started having abdominal pain in the left lower quadrant. The pain was 9/10 in intensity. There was no radiation. There was no precipitating, aggravating or relieving factors. Denies any diarrhea. Had some nausea without any vomiting. He also has been having a dry cough. But denies any shortness of breath or chest pain. Had a fever up to 103 today and was 101 yesterday. He's had generalized weakness and has been dizzy, but denies any syncopal episodes. Admits to poor oral intake. His dose was Flomax was recently increased to twice a day from once a day about 2 weeks ago. Denies any recent travel. No sick contacts otherwise.  Hospital Course:  The patient also complained of chronic abdominal pain  which is not major issue. He has improved but certainly since admission. He does not have a fever any longer. He does have a dry cough. He is not dyspneic as he was when he first presented. He is a nonsmoker. His chest x-ray shows lingular pneumonia. This will need followup. He is stable to discharge home with another seven-day course of antibiotics.  Procedures:  None.   Consultations:  None.  Discharge Exam: Filed Vitals:   12/20/12 0300 12/20/12 0522 12/20/12 0807 12/21/12 0500  BP:  122/72  131/78  Pulse:  63 62 61  Temp:  99.2 F (37.3 C)  97.3 F (36.3 C)  TempSrc:  Oral  Oral  Resp:  20 18 20   Height:      Weight:  138.7 kg (305 lb 12.5 oz)    SpO2: 97% 95% 96% 98%    General: He looks systemically well. Is not toxic or septic. Cardiovascular: Heart sounds are present and normal without murmurs or added sounds. Respiratory: Lung fields are actually clinically clear. He is alert and orientated.  Discharge Instructions  Discharge Orders   Future Orders Complete By Expires     Diet - low sodium heart healthy  As directed     Increase activity slowly  As directed         Medication List    TAKE these medications       albuterol 108 (90 BASE) MCG/ACT inhaler  Commonly known as:  PROVENTIL HFA;VENTOLIN HFA  Inhale 2 puffs into the lungs every 6 (six) hours as needed for wheezing.     aspirin 81 MG EC tablet  Take 81 mg by  mouth daily.     benzonatate 200 MG capsule  Commonly known as:  TESSALON  Take 1 capsule (200 mg total) by mouth 3 (three) times daily.     ezetimibe 10 MG tablet  Commonly known as:  ZETIA  Take 1 tablet (10 mg total) by mouth daily.     guaiFENesin 600 MG 12 hr tablet  Commonly known as:  MUCINEX  Take 2 tablets (1,200 mg total) by mouth 2 (two) times daily.     levofloxacin 500 MG tablet  Commonly known as:  LEVAQUIN  Take 1 tablet (500 mg total) by mouth daily.     lisinopril-hydrochlorothiazide 20-12.5 MG per tablet  Commonly  known as:  PRINZIDE,ZESTORETIC  Take 1 tablet by mouth daily.     multivitamin with minerals tablet  Take 1 tablet by mouth daily.     psyllium 58.6 % powder  Commonly known as:  METAMUCIL  Take 1 packet by mouth daily as needed (Fiber).     Saw Palmetto 80 MG Caps  Take 1 capsule by mouth daily.     sertraline 100 MG tablet  Commonly known as:  ZOLOFT  Take 100 mg by mouth daily.     tamsulosin 0.4 MG Caps  Commonly known as:  FLOMAX  Take 1 capsule (0.4 mg total) by mouth 2 (two) times daily.     temazepam 15 MG capsule  Commonly known as:  RESTORIL  Take 15 mg by mouth at bedtime as needed for sleep.       Allergies  Allergen Reactions  . Lodine (Etodolac) Nausea And Vomiting and Other (See Comments)    Internal Bleeding.       The results of significant diagnostics from this hospitalization (including imaging, microbiology, ancillary and laboratory) are listed below for reference.    Significant Diagnostic Studies: Dg Chest 2 View  12/19/2012   *RADIOLOGY REPORT*  Clinical Data:  hypertension and fever.  Shortness of breath.  CHEST - 2 VIEW  Comparison: 10/31/2012  Findings: Heart size is normal.  There is no pleural effusion or edema identified.  Asymmetric opacity within the lingular portion of the left lung is identified and is concerning for infection. Mild spondylosis identified within the thoracic spine.  IMPRESSION:  1. Lingular opacities worrisome for pneumonia.   Original Report Authenticated By: Signa Kell, M.D.   Ct Abdomen Pelvis W Contrast  12/19/2012   *RADIOLOGY REPORT*  Clinical Data: Left lower quadrant pain.  Nausea.  Fever.  History of diverticulitis.  CT ABDOMEN AND PELVIS WITH CONTRAST  Technique:  Multidetector CT imaging of the abdomen and pelvis was performed following the standard protocol during bolus administration of intravenous contrast.  Contrast: OMNIPAQUE IOHEXOL 300 MG/ML  SOLN  Comparison: 01/15/2004  Findings: Images of the  lung bases show airspace disease in the visualized portion of the lingula, suspicious for pneumonia.  The liver, gallbladder, pancreas, and kidneys are normal appearance.  No evidence of hydronephrosis.  A benign right adrenal adenoma remains stable.  Borderline splenomegaly is also stable. Spleen measures 13-14 cm length.  No soft tissue masses or lymphadenopathy identified within the abdomen or pelvis.  No evidence of inflammatory process or abnormal fluid collections. No evidence of bowel wall thickening or dilatation.  A small periumbilical hernia is seen containing only fat.  There is no evidence of herniated bowel loops.  IMPRESSION:  1.  Lingular air space disease, suspicious for pneumonia. 2.  No acute intra-abdominal or pelvic findings. 3.  Small  periumbilical hernia containing only fat. 4.  Stable benign right adrenal adenoma and borderline splenomegaly.   Original Report Authenticated By: Myles Rosenthal, M.D.    Microbiology: Recent Results (from the past 240 hour(s))  URINE CULTURE     Status: None   Collection Time    12/19/12  5:56 PM      Result Value Range Status   Specimen Description URINE, CLEAN CATCH   Final   Special Requests NONE   Final   Culture  Setup Time 12/19/2012 18:39   Final   Colony Count NO GROWTH   Final   Culture NO GROWTH   Final   Report Status 12/20/2012 FINAL   Final  CULTURE, BLOOD (ROUTINE X 2)     Status: None   Collection Time    12/19/12  8:43 PM      Result Value Range Status   Specimen Description BLOOD LEFT FOREARM   Final   Special Requests BOTTLES DRAWN AEROBIC AND ANAEROBIC 8CC EACH   Final   Culture NO GROWTH 2 DAYS   Final   Report Status PENDING   Incomplete  CULTURE, BLOOD (ROUTINE X 2)     Status: None   Collection Time    12/19/12  8:58 PM      Result Value Range Status   Specimen Description RIGHT ANTECUBITAL   Final   Special Requests BOTTLES DRAWN AEROBIC AND ANAEROBIC 8CC EACH   Final   Culture NO GROWTH 2 DAYS   Final   Report  Status PENDING   Incomplete     Labs: Basic Metabolic Panel:  Recent Labs Lab 12/19/12 1602 12/20/12 0517 12/21/12 0531  NA 128* 131* 134*  K 3.3* 3.7 4.1  CL 94* 96 98  CO2 23 27 27   GLUCOSE 131* 126* 124*  BUN 20 15 12   CREATININE 1.12 1.03 1.06  CALCIUM 9.0 8.5 8.7   Liver Function Tests:  Recent Labs Lab 12/19/12 1602 12/20/12 0517 12/21/12 0531  AST 66* 36 52*  ALT 87* 69* 88*  ALKPHOS 80 77 78  BILITOT 1.0 1.2 0.6  PROT 6.8 6.4 6.1  ALBUMIN 3.2* 2.9* 2.6*    Recent Labs Lab 12/19/12 1602  LIPASE 20    CBC:  Recent Labs Lab 12/19/12 1602 12/20/12 0517 12/21/12 0531  WBC 8.6 5.8 4.6  NEUTROABS 6.7  --   --   HGB 14.3 13.9 13.5  HCT 41.2 41.3 40.2  MCV 80.8 82.4 82.9  PLT 189 167 174   Cardiac Enzymes:  Recent Labs Lab 12/19/12 1602 12/19/12 2058  CKTOTAL  --  34  CKMB  --  1.6  TROPONINI <0.30  --    BNP: BNP (last 3 results)  Recent Labs  10/24/12 0815  PROBNP 14.4         Signed:  Amenah Tucci C  [facility] 12/21/2012, 8:08 AM

## 2012-12-21 NOTE — Care Management Note (Signed)
    Page 1 of 1   12/21/2012     8:31:06 AM   CARE MANAGEMENT NOTE 12/21/2012  Patient:  [name] [name],[name] [name]   Account Number:  [id number]  Date Initiated:  12/21/2012  Documentation initiated by:  Sharrie Rothman  Subjective/Objective Assessment:   Pt admitted from home with pneumonia. Pt lives alone and will return at discharge. Pt is independent with ADL's and stated that he still works.     Action/Plan:   No CM needs noted.   Anticipated DC Date:  12/21/2012   Anticipated DC Plan:  HOME/SELF CARE      DC Planning Services  CM consult      Choice offered to / List presented to:             Status of service:  Completed, signed off Medicare Important Message given?  NA - LOS <3 / Initial given by admissions (If response is "NO", the following Medicare IM given date fields will be blank) Date Medicare IM given:   Date Additional Medicare IM given:    Discharge Disposition:  HOME/SELF CARE  Per UR Regulation:    If discussed at Long Length of Stay Meetings, dates discussed:    Comments:  12/21/12 0830 Arlyss Queen, RN BSN CM

## 2012-12-24 LAB — CULTURE, BLOOD (ROUTINE X 2)
Culture: NO GROWTH
Culture: NO GROWTH

## 2013-01-04 ENCOUNTER — Encounter: Payer: Self-pay | Admitting: Family Medicine

## 2013-01-04 ENCOUNTER — Ambulatory Visit (INDEPENDENT_AMBULATORY_CARE_PROVIDER_SITE_OTHER): Payer: Medicare HMO | Admitting: Family Medicine

## 2013-01-04 VITALS — BP 110/64 | HR 77 | Temp 97.8°F | Ht 73.0 in | Wt 300.8 lb

## 2013-01-04 DIAGNOSIS — R7989 Other specified abnormal findings of blood chemistry: Secondary | ICD-10-CM

## 2013-01-04 DIAGNOSIS — G4700 Insomnia, unspecified: Secondary | ICD-10-CM

## 2013-01-04 DIAGNOSIS — J189 Pneumonia, unspecified organism: Secondary | ICD-10-CM

## 2013-01-04 MED ORDER — ZOLPIDEM TARTRATE 5 MG PO TABS: 5.0000 mg | ORAL_TABLET | Freq: Every evening | ORAL | Status: DC | PRN

## 2013-01-04 NOTE — Assessment & Plan Note (Signed)
Repeat CMET today. 

## 2013-01-04 NOTE — Patient Instructions (Addendum)
We are checking your liver function tests again today. Try 2-3 weeks of ambien for your sleep.  If you aren't back to normal at that time, come back in. We will get you in to see a pulmonologist.

## 2013-01-04 NOTE — Assessment & Plan Note (Signed)
Given history of repeated pneumonias with no obvious cause in no obvious immunocompromise state (HIV negative, RPR negative, hepatitis panel negative) hours for the patient to pulmonology for further evaluation. I'm concerned about either environmental allergy contribution, reactive airway disease, or a COPD/emphysema picture from secondhand smoke.

## 2013-01-04 NOTE — Assessment & Plan Note (Signed)
Discussed sleep hygiene, exercise, and other activity with the patient. We will give a short course of Ambien to see if we can reset his sleep cycle. If this is not working patient is to schedule a followup appointment.

## 2013-01-04 NOTE — Progress Notes (Signed)
Patient ID: [name] [name], male   DOB: 05-25-1946, 67 y.o.   MRN: [id number] Subjective: The patient is a 67 y.o. year old male who presents today for hospital followup.  1. Pneumonia: Patient was hospitalized for pneumonia. He reports that since discharge his breathing is improved. He still feels general malaise however he denies any fevers, chills, has minimal cough, denies any shortness of breath, visual changes, headaches, diarrhea, nausea, or vomiting. He has finished his antibiotics. He is not currently taking any other medications for his breathing.  2. Sleep: Patient reports that since getting home from the hospital he has been having significant problems with sleeping. Histolytic sleep latency and easy awakening. He has previously taken temazepam for this but reports that it is not currently working well for him. He reports that he has a hard time "shutting his mind down".  Patient's past medical, social, and family history were reviewed and updated as appropriate. History  Substance Use Topics  . Smoking status: Never Smoker   . Smokeless tobacco: Never Used  . Alcohol Use: No   Objective:  Filed Vitals:   01/04/13 1338  BP: 110/64  Pulse: 77  Temp: 97.8 F (36.6 C)   Gen: No acute distress, morbidly obese CV: Regular rate and rhythm, no murmur is appreciated Resp: Clear to auscultation bilaterally Ext: 2+ pulses, no edema  Assessment/Plan:  Please also see individual problems in problem list for problem-specific plans.

## 2013-01-05 LAB — COMPREHENSIVE METABOLIC PANEL
ALT: 17 U/L (ref 0–53)
CO2: 26 mEq/L (ref 19–32)
Calcium: 9.3 mg/dL (ref 8.4–10.5)
Chloride: 101 mEq/L (ref 96–112)
Creat: 1.01 mg/dL (ref 0.50–1.35)
Glucose, Bld: 91 mg/dL (ref 70–99)

## 2013-01-16 ENCOUNTER — Encounter: Payer: Self-pay | Admitting: Family Medicine

## 2013-01-24 ENCOUNTER — Ambulatory Visit (INDEPENDENT_AMBULATORY_CARE_PROVIDER_SITE_OTHER): Payer: Medicare HMO | Admitting: Emergency Medicine

## 2013-01-24 ENCOUNTER — Encounter: Payer: Self-pay | Admitting: Emergency Medicine

## 2013-01-24 ENCOUNTER — Other Ambulatory Visit: Payer: Self-pay | Admitting: *Deleted

## 2013-01-24 VITALS — BP 102/68 | HR 88 | Temp 97.8°F | Ht 71.0 in | Wt 309.4 lb

## 2013-01-24 DIAGNOSIS — Z8701 Personal history of pneumonia (recurrent): Secondary | ICD-10-CM

## 2013-01-24 NOTE — Patient Instructions (Addendum)
We will perform full pulmonary function testing at your next office visit Follow with Dr Delton Coombes next available with full PFT

## 2013-01-24 NOTE — Assessment & Plan Note (Signed)
Recurrent episodes of PNA vs bronchitis vs bronchospasm?  - need to evaluate for obstruction, obtain PFT - likely need to empirically treat rhinitis - rov next available.

## 2013-01-24 NOTE — Progress Notes (Signed)
  Subjective:    Patient ID: [name] [name], male    DOB: 10-04-1945, 67 y.o.   MRN: [id number]  HPI 67 yo never smoker, hx of HTN. Has been well until 10/12 when he had apparent URI with cough, wheeze. Lasted about 8 days then better. Happened again in 11/12 for about 10 days. Then end of Dec '12 he had another similar illness, was treated with azithro and then levoflox x 10 days. CXR at that time showed no infiltrates. The illness lasted about 20 days. He now finally feels back to baseline.   ROV 01/24/13 -- f/u visit. He has experienced several episodes bronchitis, at least once a year. At least 6 times over last yr he has had fever, wheeze, fatigue, nasal congestion and drainage, cough that becomes productive. Not currently having sx, no current rhinitis, etc. He has lost 25 lbs (intentional)   Review of Systems  Constitutional: Negative for fever and unexpected weight change.  HENT: Positive for congestion. Negative for ear pain, nosebleeds, sore throat, rhinorrhea, sneezing, trouble swallowing, dental problem, postnasal drip and sinus pressure.   Eyes: Negative.  Negative for redness and itching.  Respiratory: Positive for cough and shortness of breath. Negative for chest tightness and wheezing.   Cardiovascular: Negative.  Negative for palpitations and leg swelling.  Gastrointestinal: Negative.  Negative for nausea and vomiting.  Genitourinary: Negative.  Negative for dysuria.  Musculoskeletal: Negative for joint swelling.  Skin: Negative.  Negative for rash.  Neurological: Negative.  Negative for headaches.  Hematological: Does not bruise/bleed easily.  Psychiatric/Behavioral: Negative for dysphoric mood. The patient is nervous/anxious.        Objective:   Physical Exam Filed Vitals:   01/24/13 1621  BP: 102/68  Pulse: 88  Temp: 97.8 F (36.6 C)  TempSrc: Oral  Height: 5\' 11"  (1.803 m)  Weight: 309 lb 6.4 oz (140.343 kg)  SpO2: 96%   Gen: Pleasant, obese, in no distress,   normal affect  ENT: No lesions,  mouth clear,  oropharynx clear, no postnasal drip  Neck: No JVD, no TMG, no carotid bruits  Lungs: No use of accessory muscles, no dullness to percussion, clear without rales or rhonchi  Cardiovascular: RRR, heart sounds normal, no murmur or gallops, no peripheral edema  Musculoskeletal: No deformities, no cyanosis or clubbing  Neuro: alert, non focal  Skin: Warm, no lesions or rashes     Assessment & Plan:  History of pneumonia Recurrent episodes of PNA vs bronchitis vs bronchospasm?  - need to evaluate for obstruction, obtain PFT - likely need to empirically treat rhinitis - rov next available.

## 2013-01-25 ENCOUNTER — Telehealth: Payer: Self-pay | Admitting: Family Medicine

## 2013-01-25 MED ORDER — ZOLPIDEM TARTRATE 5 MG PO TABS: 5.0000 mg | ORAL_TABLET | Freq: Every evening | ORAL | Status: DC | PRN

## 2013-01-25 NOTE — Telephone Encounter (Signed)
[name] [name] is calling from [facility] in [place] needs Dr. Radonna Ricker  DEA #  To be able to fill the medication for [facility]. JW

## 2013-01-25 NOTE — Telephone Encounter (Signed)
Done.  Lawren Sexson L, CMA  

## 2013-01-25 NOTE — Telephone Encounter (Signed)
Could you please call in ambien 5mg , dispense #30 with 1 refill, take QHS PRN sleep.

## 2013-01-25 NOTE — Telephone Encounter (Signed)
Prescription called as requested - Hadassah Pais, RN-BSN

## 2013-02-28 ENCOUNTER — Ambulatory Visit: Payer: Medicare HMO | Admitting: Emergency Medicine

## 2013-03-01 HISTORY — PX: CATARACT EXTRACTION, BILATERAL: SHX1313

## 2013-03-06 ENCOUNTER — Telehealth: Payer: Self-pay | Admitting: Sports Medicine

## 2013-03-06 NOTE — Telephone Encounter (Signed)
Pt came into office needing an referral sent to [facility].

## 2013-03-06 NOTE — Telephone Encounter (Signed)
Pt needs an OV.  Please call and schedule.  Kailyn Vanderslice, Darlyne Russian, CMA

## 2013-03-19 ENCOUNTER — Ambulatory Visit (INDEPENDENT_AMBULATORY_CARE_PROVIDER_SITE_OTHER): Payer: Medicare HMO | Admitting: Emergency Medicine

## 2013-03-19 ENCOUNTER — Encounter: Payer: Self-pay | Admitting: Emergency Medicine

## 2013-03-19 VITALS — BP 116/70 | HR 83 | Temp 98.5°F | Ht 70.0 in | Wt 314.0 lb

## 2013-03-19 DIAGNOSIS — J45909 Unspecified asthma, uncomplicated: Secondary | ICD-10-CM

## 2013-03-19 DIAGNOSIS — Z8701 Personal history of pneumonia (recurrent): Secondary | ICD-10-CM

## 2013-03-19 HISTORY — DX: Unspecified asthma, uncomplicated: J45.909

## 2013-03-19 NOTE — Progress Notes (Signed)
  Subjective:    Patient ID: [name] [name], male    DOB: 1945-08-14, 67 y.o.   MRN: [id number]  HPI 67 yo never smoker, hx of HTN. Has been well until 10/12 when he had apparent URI with cough, wheeze. Lasted about 8 days then better. Happened again in 11/12 for about 10 days. Then end of Dec '12 he had another similar illness, was treated with azithro and then levoflox x 10 days. CXR at that time showed no infiltrates. The illness lasted about 20 days. He now finally feels back to baseline.   ROV 01/24/13 -- f/u visit. He has experienced several episodes bronchitis, at least once a year. At least 6 times over last yr he has had fever, wheeze, fatigue, nasal congestion and drainage, cough that becomes productive. Not currently having sx, no current rhinitis, etc. He has lost 25 lbs (intentional)   ROV 03/19/13 -- follows for recurrent episodes bronchitis vs bronchospasm, multiple flares as outlined above. Usually characterized by nasal sx and then chest sx. 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 No flares since last time.    Review of Systems  Constitutional: Negative for fever and unexpected weight change.  HENT: Positive for congestion. Negative for ear pain, nosebleeds, sore throat, rhinorrhea, sneezing, trouble swallowing, dental problem, postnasal drip and sinus pressure.   Eyes: Negative.  Negative for redness and itching.  Respiratory: Positive for cough and shortness of breath. Negative for chest tightness and wheezing.   Cardiovascular: Negative.  Negative for palpitations and leg swelling.  Gastrointestinal: Negative.  Negative for nausea and vomiting.  Genitourinary: Negative.  Negative for dysuria.  Musculoskeletal: Negative for joint swelling.  Skin: Negative.  Negative for rash.  Neurological: Negative.  Negative for headaches.  Hematological: Does not bruise/bleed easily.  Psychiatric/Behavioral: Negative for dysphoric mood. The patient is nervous/anxious.         Objective:   Physical Exam Filed Vitals:   03/19/13 1556  BP: 116/70  Pulse: 83  Temp: 98.5 F (36.9 C)  TempSrc: Oral  Height: 5\' 10"  (1.778 m)  Weight: 314 lb (142.429 kg)  SpO2: 94%   Gen: Pleasant, obese, in no distress,  normal affect  ENT: No lesions,  mouth clear,  oropharynx clear, no postnasal drip  Neck: No JVD, no TMG, no carotid bruits  Lungs: No use of accessory muscles, no dullness to percussion, clear without rales or rhonchi  Cardiovascular: RRR, heart sounds normal, no murmur or gallops, no peripheral edema  Musculoskeletal: No deformities, no cyanosis or clubbing  Neuro: alert, non focal  Skin: Warm, no lesions or rashes     Assessment & Plan:  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 He has intermittent sx, will not start inhaled steroid at this time.  - albuterol prn - no maintenance meds for now - change ACE-I to ARB - rov 6

## 2013-03-19 NOTE — Progress Notes (Signed)
PFT done today. 

## 2013-03-19 NOTE — Assessment & Plan Note (Signed)
Based on spirometry and BD response. He has intermittent sx, will not start inhaled steroid at this time.  - albuterol prn - no maintenance meds for now - change ACE-I to ARB - rov 6

## 2013-03-19 NOTE — Patient Instructions (Addendum)
Please continue to use your albuterol 2 puffs if needed for wheezing or shortness of breath Stop your lisinopril/HCTZ Start losartan/HCTZ 20-12.5mg  once a day Follow with Dr Delton Coombes in 6 months or sooner if you have any problems

## 2013-03-21 ENCOUNTER — Emergency Department (HOSPITAL_COMMUNITY): Payer: No Typology Code available for payment source

## 2013-03-21 ENCOUNTER — Inpatient Hospital Stay (HOSPITAL_COMMUNITY)
Admission: EM | Admit: 2013-03-21 | Discharge: 2013-03-25 | DRG: 605 | Disposition: A | Discharge status: Home / Self Care | Payer: No Typology Code available for payment source | Attending: General Surgery | Admitting: General Surgery

## 2013-03-21 ENCOUNTER — Encounter (HOSPITAL_COMMUNITY): Payer: Self-pay | Admitting: Emergency Medicine

## 2013-03-21 DIAGNOSIS — I10 Essential (primary) hypertension: Secondary | ICD-10-CM | POA: Diagnosis present

## 2013-03-21 DIAGNOSIS — Y92410 Unspecified street and highway as the place of occurrence of the external cause: Secondary | ICD-10-CM

## 2013-03-21 DIAGNOSIS — E669 Obesity, unspecified: Secondary | ICD-10-CM | POA: Diagnosis present

## 2013-03-21 DIAGNOSIS — S30811A Abrasion of abdominal wall, initial encounter: Secondary | ICD-10-CM

## 2013-03-21 DIAGNOSIS — IMO0002 Reserved for concepts with insufficient information to code with codable children: Secondary | ICD-10-CM | POA: Diagnosis present

## 2013-03-21 DIAGNOSIS — M1710 Unilateral primary osteoarthritis, unspecified knee: Secondary | ICD-10-CM | POA: Diagnosis present

## 2013-03-21 DIAGNOSIS — R109 Unspecified abdominal pain: Secondary | ICD-10-CM

## 2013-03-21 DIAGNOSIS — Z79899 Other long term (current) drug therapy: Secondary | ICD-10-CM

## 2013-03-21 DIAGNOSIS — S8000XA Contusion of unspecified knee, initial encounter: Principal | ICD-10-CM | POA: Diagnosis present

## 2013-03-21 DIAGNOSIS — K219 Gastro-esophageal reflux disease without esophagitis: Secondary | ICD-10-CM | POA: Diagnosis present

## 2013-03-21 DIAGNOSIS — N400 Enlarged prostate without lower urinary tract symptoms: Secondary | ICD-10-CM | POA: Diagnosis present

## 2013-03-21 DIAGNOSIS — S301XXA Contusion of abdominal wall, initial encounter: Secondary | ICD-10-CM

## 2013-03-21 DIAGNOSIS — E785 Hyperlipidemia, unspecified: Secondary | ICD-10-CM | POA: Diagnosis present

## 2013-03-21 DIAGNOSIS — Z6841 Body Mass Index (BMI) 40.0 and over, adult: Secondary | ICD-10-CM

## 2013-03-21 DIAGNOSIS — T07XXXA Unspecified multiple injuries, initial encounter: Secondary | ICD-10-CM | POA: Diagnosis present

## 2013-03-21 HISTORY — DX: Headache: R51

## 2013-03-21 LAB — COMPREHENSIVE METABOLIC PANEL
Alkaline Phosphatase: 60 U/L (ref 39–117)
BUN: 21 mg/dL (ref 6–23)
Chloride: 102 mEq/L (ref 96–112)
Creatinine, Ser: 1.34 mg/dL (ref 0.50–1.35)
GFR calc Af Amer: 62 mL/min — ABNORMAL LOW (ref >90)
Glucose, Bld: 149 mg/dL — ABNORMAL HIGH (ref 70–99)
Potassium: 3.9 mEq/L (ref 3.5–5.1)
Total Bilirubin: 0.4 mg/dL (ref 0.3–1.2)
Total Protein: 6.3 g/dL (ref 6.0–8.3)

## 2013-03-21 LAB — CBC
Platelets: 237 10*3/uL (ref 150–400)
RDW: 15.2 % (ref 11.5–15.5)
WBC: 7.7 10*3/uL (ref 4.0–10.5)

## 2013-03-21 LAB — POCT I-STAT, CHEM 8
Chloride: 105 mEq/L (ref 96–112)
HCT: 48 % (ref 39.0–52.0)
Potassium: 3.9 mEq/L (ref 3.5–5.1)
Sodium: 139 mEq/L (ref 135–145)

## 2013-03-21 LAB — PROTIME-INR: INR: 1.02 (ref 0.00–1.49)

## 2013-03-21 LAB — CG4 I-STAT (LACTIC ACID): Lactic Acid, Venous: 2.52 mmol/L — ABNORMAL HIGH (ref 0.5–2.2)

## 2013-03-21 LAB — SAMPLE TO BLOOD BANK

## 2013-03-21 MED ORDER — LISINOPRIL-HYDROCHLOROTHIAZIDE 20-12.5 MG PO TABS: 1.0000 | ORAL_TABLET | Freq: Every day | ORAL | Status: DC

## 2013-03-21 MED ORDER — EZETIMIBE 10 MG PO TABS
10.0000 mg | ORAL_TABLET | Freq: Every day | ORAL | Status: DC
  Administered 2013-03-22 – 2013-03-25 (×4): 10 mg via ORAL
  Filled 2013-03-21 (×4): qty 1

## 2013-03-21 MED ORDER — TAMSULOSIN HCL 0.4 MG PO CAPS
0.4000 mg | ORAL_CAPSULE | Freq: Two times a day (BID) | ORAL | Status: DC
  Administered 2013-03-22 – 2013-03-25 (×7): 0.4 mg via ORAL
  Filled 2013-03-21 (×8): qty 1

## 2013-03-21 MED ORDER — KCL IN DEXTROSE-NACL 20-5-0.9 MEQ/L-%-% IV SOLN
INTRAVENOUS | Status: DC
  Administered 2013-03-22 – 2013-03-24 (×3): via INTRAVENOUS
  Administered 2013-03-25: 75 mL/h via INTRAVENOUS
  Filled 2013-03-21 (×8): qty 1000

## 2013-03-21 MED ORDER — GATIFLOXACIN 0.5 % OP SOLN
1.0000 [drp] | Freq: Three times a day (TID) | OPHTHALMIC | Status: DC
  Administered 2013-03-22 – 2013-03-25 (×9): 1 [drp] via OPHTHALMIC
  Filled 2013-03-21: qty 2.5

## 2013-03-21 MED ORDER — HYDROMORPHONE HCL PF 1 MG/ML IJ SOLN
1.0000 mg | Freq: Once | INTRAMUSCULAR | Status: AC
  Administered 2013-03-21: 1 mg via INTRAVENOUS
  Filled 2013-03-21: qty 1

## 2013-03-21 MED ORDER — SODIUM CHLORIDE 0.9 % IV BOLUS (SEPSIS)
1000.0000 mL | Freq: Once | INTRAVENOUS | Status: AC
  Administered 2013-03-21: 1000 mL via INTRAVENOUS

## 2013-03-21 MED ORDER — FENTANYL CITRATE 0.05 MG/ML IJ SOLN
INTRAMUSCULAR | Status: AC
  Filled 2013-03-21: qty 2

## 2013-03-21 MED ORDER — FENTANYL CITRATE 0.05 MG/ML IJ SOLN
INTRAMUSCULAR | Status: DC | PRN
  Administered 2013-03-21: 50 ug via INTRAVENOUS

## 2013-03-21 MED ORDER — DIFLUPREDNATE 0.05 % OP EMUL: 1.0000 [drp] | Freq: Three times a day (TID) | OPHTHALMIC | Status: DC

## 2013-03-21 MED ORDER — ONDANSETRON HCL 4 MG/2ML IJ SOLN: 4.0000 mg | Freq: Four times a day (QID) | INTRAMUSCULAR | Status: DC | PRN

## 2013-03-21 MED ORDER — PANTOPRAZOLE SODIUM 40 MG IV SOLR
40.0000 mg | Freq: Every day | INTRAVENOUS | Status: DC
  Filled 2013-03-21 (×2): qty 40

## 2013-03-21 MED ORDER — FENTANYL CITRATE 0.05 MG/ML IJ SOLN
50.0000 ug | Freq: Once | INTRAMUSCULAR | Status: AC
  Administered 2013-03-21: 50 ug via INTRAVENOUS

## 2013-03-21 MED ORDER — HYDROCODONE-ACETAMINOPHEN 5-325 MG PO TABS
1.0000 | ORAL_TABLET | ORAL | Status: DC | PRN
Start: 2013-03-21 — End: 2013-03-22
  Administered 2013-03-22 (×3): 1 via ORAL
  Filled 2013-03-21 (×3): qty 1

## 2013-03-21 MED ORDER — IOHEXOL 300 MG/ML  SOLN
100.0000 mL | Freq: Once | INTRAMUSCULAR | Status: AC | PRN
  Administered 2013-03-21: 100 mL via INTRAVENOUS

## 2013-03-21 MED ORDER — MORPHINE SULFATE 4 MG/ML IJ SOLN
4.0000 mg | INTRAMUSCULAR | Status: DC | PRN
  Administered 2013-03-22 – 2013-03-24 (×7): 4 mg via INTRAVENOUS
  Filled 2013-03-21 (×7): qty 1

## 2013-03-21 MED ORDER — ONDANSETRON HCL 4 MG PO TABS: 4.0000 mg | ORAL_TABLET | Freq: Four times a day (QID) | ORAL | Status: DC | PRN

## 2013-03-21 MED ORDER — SERTRALINE HCL 100 MG PO TABS
100.0000 mg | ORAL_TABLET | Freq: Every day | ORAL | Status: DC
  Administered 2013-03-23 – 2013-03-25 (×3): 100 mg via ORAL
  Filled 2013-03-21 (×6): qty 1

## 2013-03-21 MED ORDER — PANTOPRAZOLE SODIUM 40 MG PO TBEC
40.0000 mg | DELAYED_RELEASE_TABLET | Freq: Every day | ORAL | Status: DC
Start: 2013-03-22 — End: 2013-03-25
  Administered 2013-03-22 – 2013-03-25 (×4): 40 mg via ORAL
  Filled 2013-03-21 (×4): qty 1
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
## 2013-03-22 ENCOUNTER — Observation Stay (HOSPITAL_COMMUNITY): Payer: No Typology Code available for payment source

## 2013-03-22 ENCOUNTER — Encounter (HOSPITAL_COMMUNITY): Payer: Self-pay | Admitting: General Practice

## 2013-03-22 DIAGNOSIS — G8911 Acute pain due to trauma: Secondary | ICD-10-CM

## 2013-03-22 DIAGNOSIS — R109 Unspecified abdominal pain: Secondary | ICD-10-CM

## 2013-03-22 DIAGNOSIS — T07XXXA Unspecified multiple injuries, initial encounter: Secondary | ICD-10-CM | POA: Diagnosis present

## 2013-03-22 LAB — URINALYSIS, ROUTINE W REFLEX MICROSCOPIC
Bilirubin Urine: NEGATIVE
Hgb urine dipstick: NEGATIVE
Ketones, ur: NEGATIVE mg/dL
Protein, ur: NEGATIVE mg/dL
Urobilinogen, UA: 0.2 mg/dL (ref 0.0–1.0)

## 2013-03-22 LAB — CBC
MCH: 27.7 pg (ref 26.0–34.0)
MCH: 28.4 pg (ref 26.0–34.0)
MCHC: 33 g/dL (ref 30.0–36.0)
Platelets: 208 10*3/uL (ref 150–400)
Platelets: 216 10*3/uL (ref 150–400)
RBC: 5.03 MIL/uL (ref 4.22–5.81)
RDW: 15.5 % (ref 11.5–15.5)
WBC: 7.7 10*3/uL (ref 4.0–10.5)

## 2013-03-22 LAB — BASIC METABOLIC PANEL
Calcium: 8.4 mg/dL (ref 8.4–10.5)
Creatinine, Ser: 1.04 mg/dL (ref 0.50–1.35)
GFR calc non Af Amer: 73 mL/min — ABNORMAL LOW (ref >90)
Sodium: 137 mEq/L (ref 135–145)

## 2013-03-22 MED ORDER — TRAMADOL HCL 50 MG PO TABS: 50.0000 mg | ORAL_TABLET | Freq: Four times a day (QID) | ORAL | Status: DC | PRN

## 2013-03-22 MED ORDER — PREDNISOLONE ACETATE 1 % OP SUSP
1.0000 [drp] | Freq: Three times a day (TID) | OPHTHALMIC | Status: DC
  Administered 2013-03-22 – 2013-03-25 (×9): 1 [drp] via OPHTHALMIC
  Filled 2013-03-22: qty 1

## 2013-03-22 MED ORDER — HYDROCHLOROTHIAZIDE 12.5 MG PO CAPS
12.5000 mg | ORAL_CAPSULE | Freq: Every day | ORAL | Status: DC
  Administered 2013-03-22 – 2013-03-25 (×4): 12.5 mg via ORAL
  Filled 2013-03-22 (×4): qty 1

## 2013-03-22 MED ORDER — OXYCODONE HCL 5 MG PO TABS
5.0000 mg | ORAL_TABLET | ORAL | Status: DC | PRN
  Administered 2013-03-22 – 2013-03-24 (×7): 5 mg via ORAL
  Filled 2013-03-22 (×7): qty 1

## 2013-03-22 MED ORDER — LISINOPRIL 20 MG PO TABS
20.0000 mg | ORAL_TABLET | Freq: Every day | ORAL | Status: DC
  Administered 2013-03-22 – 2013-03-25 (×4): 20 mg via ORAL
  Filled 2013-03-22 (×4): qty 1

## 2013-03-22 MED ORDER — TRAMADOL HCL 50 MG PO TABS
50.0000 mg | ORAL_TABLET | Freq: Three times a day (TID) | ORAL | Status: DC
  Administered 2013-03-22 – 2013-03-25 (×8): 50 mg via ORAL
  Filled 2013-03-22 (×8): qty 1

## 2013-03-22 MED ORDER — OXYCODONE-ACETAMINOPHEN 5-325 MG PO TABS
1.0000 | ORAL_TABLET | ORAL | Status: DC | PRN
  Administered 2013-03-22 – 2013-03-24 (×7): 1 via ORAL
  Filled 2013-03-22 (×7): qty 1

## 2013-03-22 NOTE — Progress Notes (Signed)
BP 119/55  Pulse 63  Temp(Src) 97.9 F (36.6 C) (Oral)  Resp 20  Ht 6\' 1"  (1.854 m)  Wt 138.9 kg (306 lb 3.5 oz)  BMI 40.41 kg/m2  SpO2 94% I have reviewed the plain films, ct, and the mri of the cervical spine. There is no soft tissue injury to be seen. The movement seen on the flexion extension films is due to arthritis, not injury. I have noted that there is no neck pain, nor neurologic deficit noted. It is safe to remove the c collar.
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
## 2013-03-22 NOTE — Progress Notes (Signed)
MRIc-spine-Dr. Lindie Spruce discussed with Dr. Franky Macho, ok to DC c-collar and stable for NSU standpoint Rt knee pain-XR unremarkable.  Pt followed by Dr. Thurston Hole.  Spoke with PA, he will see patient at some point today.  Order PT and knee brace, start mobilizing keep overnight for pain control, pt also lives alone so I would like to ensure that he is safe to go home by himself.  Hopefully DC tomorrow.  [name] Leep, NP
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
## 2013-03-22 NOTE — Progress Notes (Signed)
In spite of abnormal movement of the vertebrae on his flexion/extension C-spine, he has no neck pain.  Complaining mostly of right knee pain.  Will check X-ray and get ortho involved.  This patient has been seen and I agree with the findings and treatment plan.  Marta Lamas. Gae Bon, MD, FACS [phone] (pager) [phone] (direct pager) Trauma Surgeon
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
## 2013-03-22 NOTE — Progress Notes (Signed)
UR completed 

## 2013-03-23 MED ORDER — POLYETHYLENE GLYCOL 3350 17 G PO PACK
17.0000 g | PACK | Freq: Every day | ORAL | Status: DC
  Administered 2013-03-23 – 2013-03-25 (×3): 17 g via ORAL
  Filled 2013-03-23 (×4): qty 1
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
## 2013-03-24 ENCOUNTER — Inpatient Hospital Stay (HOSPITAL_COMMUNITY): Payer: No Typology Code available for payment source

## 2013-03-24 LAB — CBC
HCT: 43.5 % (ref 39.0–52.0)
Hemoglobin: 14.8 g/dL (ref 13.0–17.0)
RDW: 15.1 % (ref 11.5–15.5)
WBC: 6 10*3/uL (ref 4.0–10.5)

## 2013-03-24 NOTE — Progress Notes (Signed)
Utilization review completed.  
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
## 2013-03-25 DIAGNOSIS — T148XXA Other injury of unspecified body region, initial encounter: Secondary | ICD-10-CM

## 2013-03-25 DIAGNOSIS — IMO0002 Reserved for concepts with insufficient information to code with codable children: Secondary | ICD-10-CM

## 2013-03-25 MED ORDER — POLYETHYLENE GLYCOL 3350 17 G PO PACK: 17.0000 g | PACK | Freq: Every day | ORAL | Status: DC

## 2013-03-25 MED ORDER — OXYCODONE-ACETAMINOPHEN 5-325 MG PO TABS: 1.0000 | ORAL_TABLET | ORAL | Status: AC | PRN

## 2013-03-25 MED ORDER — OXYCODONE HCL 5 MG PO TABS: 5.0000 mg | ORAL_TABLET | ORAL | Status: AC | PRN

## 2013-03-25 MED ORDER — TRAMADOL HCL 50 MG PO TABS: 50.0000 mg | ORAL_TABLET | Freq: Three times a day (TID) | ORAL | Status: AC

## 2013-03-25 NOTE — Progress Notes (Signed)
CT head done,denies any further dizziness or light headedness,neuro intact.VSS Linward [place] D

## 2013-03-25 NOTE — Discharge Summary (Signed)
Kailynne Ferrington, MD, MPH, FACS Pager: [phone]  
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
## 2013-04-02 ENCOUNTER — Encounter: Payer: Self-pay | Admitting: Sports Medicine

## 2013-04-02 ENCOUNTER — Ambulatory Visit (INDEPENDENT_AMBULATORY_CARE_PROVIDER_SITE_OTHER): Payer: Medicare HMO | Admitting: Sports Medicine

## 2013-04-02 VITALS — BP 132/72 | HR 80 | Temp 98.7°F | Ht 73.0 in | Wt 311.0 lb

## 2013-04-02 DIAGNOSIS — M25552 Pain in left hip: Secondary | ICD-10-CM

## 2013-04-02 DIAGNOSIS — M549 Dorsalgia, unspecified: Secondary | ICD-10-CM | POA: Insufficient documentation

## 2013-04-02 DIAGNOSIS — L989 Disorder of the skin and subcutaneous tissue, unspecified: Secondary | ICD-10-CM

## 2013-04-02 DIAGNOSIS — M25559 Pain in unspecified hip: Secondary | ICD-10-CM

## 2013-04-02 DIAGNOSIS — D049 Carcinoma in situ of skin, unspecified: Secondary | ICD-10-CM | POA: Insufficient documentation

## 2013-04-02 MED ORDER — DICLOFENAC SODIUM 75 MG PO TBEC: 75.0000 mg | DELAYED_RELEASE_TABLET | Freq: Two times a day (BID) | ORAL | Status: DC

## 2013-04-02 MED ORDER — GABAPENTIN 100 MG PO CAPS: 100.0000 mg | ORAL_CAPSULE | Freq: Three times a day (TID) | ORAL | Status: DC

## 2013-04-02 NOTE — Patient Instructions (Addendum)
It was nice to see you today, thanks for coming in!  Give you a prescription for an anti-inflammatory in for I nerve medication.  Please follow up with Doctor Eulah Pont to further evaluate your right knee.  If you're continuing to have left hip pain please let him know as well.  I'll be more than happy to see you back for either of these things in the meantime.   I encouraged to continue working on your weight loss.  I have referred you to dermatology, you'll either here from our office or there office regarding this appointment.  Please plan to return to see me in 3 months or sooner if not better  If you need anything prior to your next visit please call the clinic. Please Bring all medications or accurate medication list with you to each appointment; an accurate medication list is essential in providing you the best care possible.
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
## 2013-04-04 NOTE — Assessment & Plan Note (Addendum)
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

## 2013-04-04 NOTE — Assessment & Plan Note (Signed)
No red flags.  Encourage resuming activity as tolerated.  Mobic as above.
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
## 2013-04-22 ENCOUNTER — Other Ambulatory Visit: Payer: Self-pay | Admitting: Orthopedic Surgery

## 2013-04-23 ENCOUNTER — Encounter (HOSPITAL_BASED_OUTPATIENT_CLINIC_OR_DEPARTMENT_OTHER): Payer: Self-pay | Admitting: *Deleted

## 2013-04-23 NOTE — Progress Notes (Signed)
04/23/13 1333  OBSTRUCTIVE SLEEP APNEA  Have you ever been diagnosed with sleep apnea through a sleep study? No  Do you snore loudly (loud enough to be heard through closed doors)?  0  Do you often feel tired, fatigued, or sleepy during the daytime? 0  Has anyone observed you stop breathing during your sleep? 0  Do you have, or are you being treated for high blood pressure? 1  BMI more than 35 kg/m2? 1  Age over 66 years old? 1  Gender: 1  Obstructive Sleep Apnea Score 4  Score 4 or greater  Results sent to PCP    04/23/13 1333  OBSTRUCTIVE SLEEP APNEA  Have you ever been diagnosed with sleep apnea through a sleep study? No  Do you snore loudly (loud enough to be heard through closed doors)?  0  Do you often feel tired, fatigued, or sleepy during the daytime? 0  Has anyone observed you stop breathing during your sleep? 0  Do you have, or are you being treated for high blood pressure? 1  BMI more than 35 kg/m2? 1  Age over 47 years old? 1  Gender: 1  Obstructive Sleep Apnea Score 4  Score 4 or greater  Results sent to PCP

## 2013-04-23 NOTE — Progress Notes (Signed)
Pt wants to come here for bmet instead AP-all preop instructions reviewed-to bring crutches-denies sleep apnea or cardiac problems

## 2013-04-24 ENCOUNTER — Encounter (HOSPITAL_BASED_OUTPATIENT_CLINIC_OR_DEPARTMENT_OTHER): Payer: Self-pay | Admitting: *Deleted

## 2013-04-24 ENCOUNTER — Encounter (HOSPITAL_BASED_OUTPATIENT_CLINIC_OR_DEPARTMENT_OTHER)
Admission: RE | Admit: 2013-04-24 | Discharge: 2013-04-24 | Disposition: A | Discharge status: Home / Self Care | Payer: Medicare HMO | Source: Ambulatory Visit | Attending: Orthopedic Surgery | Admitting: Orthopedic Surgery

## 2013-04-24 LAB — BASIC METABOLIC PANEL
CO2: 27 mEq/L (ref 19–32)
Calcium: 9.1 mg/dL (ref 8.4–10.5)
Creatinine, Ser: 1.01 mg/dL (ref 0.50–1.35)
GFR calc Af Amer: 87 mL/min — ABNORMAL LOW (ref >90)
GFR calc non Af Amer: 75 mL/min — ABNORMAL LOW (ref >90)
Glucose, Bld: 117 mg/dL — ABNORMAL HIGH (ref 70–99)
Sodium: 137 mEq/L (ref 135–145)
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
## 2013-04-25 ENCOUNTER — Ambulatory Visit (HOSPITAL_BASED_OUTPATIENT_CLINIC_OR_DEPARTMENT_OTHER): Payer: Medicare HMO | Admitting: Anesthesiology

## 2013-04-25 ENCOUNTER — Encounter (HOSPITAL_BASED_OUTPATIENT_CLINIC_OR_DEPARTMENT_OTHER): Payer: Self-pay | Admitting: *Deleted

## 2013-04-25 ENCOUNTER — Encounter (HOSPITAL_BASED_OUTPATIENT_CLINIC_OR_DEPARTMENT_OTHER): Payer: Self-pay | Admitting: Anesthesiology

## 2013-04-25 ENCOUNTER — Ambulatory Visit (HOSPITAL_BASED_OUTPATIENT_CLINIC_OR_DEPARTMENT_OTHER)
Admission: RE | Admit: 2013-04-25 | Discharge: 2013-04-25 | Disposition: A | Discharge status: Home / Self Care | Payer: Medicare HMO | Source: Ambulatory Visit | Attending: Orthopedic Surgery | Admitting: Orthopedic Surgery

## 2013-04-25 ENCOUNTER — Encounter (HOSPITAL_BASED_OUTPATIENT_CLINIC_OR_DEPARTMENT_OTHER)
Admission: RE | Disposition: A | Discharge status: Home / Self Care | Payer: Self-pay | Source: Ambulatory Visit | Attending: Orthopedic Surgery

## 2013-04-25 DIAGNOSIS — M6580 Other synovitis and tenosynovitis, unspecified site: Secondary | ICD-10-CM | POA: Insufficient documentation

## 2013-04-25 DIAGNOSIS — Z01812 Encounter for preprocedural laboratory examination: Secondary | ICD-10-CM | POA: Insufficient documentation

## 2013-04-25 DIAGNOSIS — M24669 Ankylosis, unspecified knee: Secondary | ICD-10-CM | POA: Insufficient documentation

## 2013-04-25 DIAGNOSIS — IMO0002 Reserved for concepts with insufficient information to code with codable children: Secondary | ICD-10-CM | POA: Insufficient documentation

## 2013-04-25 DIAGNOSIS — S83419A Sprain of medial collateral ligament of unspecified knee, initial encounter: Secondary | ICD-10-CM | POA: Insufficient documentation

## 2013-04-25 DIAGNOSIS — M25561 Pain in right knee: Secondary | ICD-10-CM

## 2013-04-25 HISTORY — PX: KNEE ARTHROSCOPY WITH MEDIAL MENISECTOMY: SHX5651

## 2013-04-25 HISTORY — DX: Presence of dental prosthetic device (complete) (partial): Z97.2

## 2013-04-25 LAB — POCT HEMOGLOBIN-HEMACUE: Hemoglobin: 15.2 g/dL (ref 13.0–17.0)

## 2013-04-25 SURGERY — ARTHROSCOPY, KNEE, WITH MEDIAL MENISCECTOMY
Anesthesia: General | Site: Knee | Laterality: Right | Wound class: Clean

## 2013-04-25 MED ORDER — LIDOCAINE HCL (CARDIAC) 20 MG/ML IV SOLN
INTRAVENOUS | Status: DC | PRN
  Administered 2013-04-25: 80 mg via INTRAVENOUS

## 2013-04-25 MED ORDER — HYDROMORPHONE HCL PF 1 MG/ML IJ SOLN
0.2500 mg | INTRAMUSCULAR | Status: DC | PRN
  Administered 2013-04-25: 0.5 mg via INTRAVENOUS

## 2013-04-25 MED ORDER — BUPIVACAINE HCL (PF) 0.5 % IJ SOLN
INTRAMUSCULAR | Status: DC | PRN
  Administered 2013-04-25: 20 mL

## 2013-04-25 MED ORDER — DEXTROSE 5 % IV SOLN
3.0000 g | INTRAVENOUS | Status: AC
  Administered 2013-04-25: 3 g via INTRAVENOUS

## 2013-04-25 MED ORDER — LACTATED RINGERS IV SOLN
INTRAVENOUS | Status: DC
  Administered 2013-04-25: 10:00:00 via INTRAVENOUS

## 2013-04-25 MED ORDER — DEXTROSE-NACL 5-0.45 % IV SOLN: INTRAVENOUS | Status: DC

## 2013-04-25 MED ORDER — OXYCODONE-ACETAMINOPHEN 10-325 MG PO TABS: 1.0000 | ORAL_TABLET | ORAL | Status: DC | PRN

## 2013-04-25 MED ORDER — MIDAZOLAM HCL 5 MG/5ML IJ SOLN
INTRAMUSCULAR | Status: DC | PRN
  Administered 2013-04-25: 1 mg via INTRAVENOUS

## 2013-04-25 MED ORDER — CHLORHEXIDINE GLUCONATE 4 % EX LIQD: 60.0000 mL | Freq: Once | CUTANEOUS | Status: DC

## 2013-04-25 MED ORDER — PROMETHAZINE HCL 25 MG/ML IJ SOLN: 6.2500 mg | INTRAMUSCULAR | Status: DC | PRN

## 2013-04-25 MED ORDER — OXYCODONE HCL 5 MG/5ML PO SOLN: 5.0000 mg | Freq: Once | ORAL | Status: AC | PRN

## 2013-04-25 MED ORDER — FENTANYL CITRATE 0.05 MG/ML IJ SOLN
INTRAMUSCULAR | Status: DC | PRN
  Administered 2013-04-25: 50 ug via INTRAVENOUS
  Administered 2013-04-25 (×2): 25 ug via INTRAVENOUS

## 2013-04-25 MED ORDER — ACETAMINOPHEN 500 MG PO TABS
1000.0000 mg | ORAL_TABLET | Freq: Once | ORAL | Status: AC
  Administered 2013-04-25: 1000 mg via ORAL

## 2013-04-25 MED ORDER — PROPOFOL 10 MG/ML IV BOLUS
INTRAVENOUS | Status: DC | PRN
  Administered 2013-04-25: 200 mg via INTRAVENOUS

## 2013-04-25 MED ORDER — OXYCODONE HCL 5 MG PO TABS
5.0000 mg | ORAL_TABLET | Freq: Once | ORAL | Status: AC | PRN
  Administered 2013-04-25: 5 mg via ORAL

## 2013-04-25 MED ORDER — DEXAMETHASONE SODIUM PHOSPHATE 4 MG/ML IJ SOLN
INTRAMUSCULAR | Status: DC | PRN
  Administered 2013-04-25: 10 mg via INTRAVENOUS

## 2013-04-25 MED ORDER — FENTANYL CITRATE 0.05 MG/ML IJ SOLN: 50.0000 ug | INTRAMUSCULAR | Status: DC | PRN

## 2013-04-25 MED ORDER — METHYLPREDNISOLONE ACETATE 80 MG/ML IJ SUSP
INTRAMUSCULAR | Status: DC | PRN
  Administered 2013-04-25: 80 mg

## 2013-04-25 MED ORDER — MIDAZOLAM HCL 2 MG/2ML IJ SOLN: 1.0000 mg | INTRAMUSCULAR | Status: DC | PRN

## 2013-04-25 MED ORDER — SODIUM CHLORIDE 0.9 % IR SOLN
Status: DC | PRN
  Administered 2013-04-25: 6000 mL

## 2013-04-25 SURGICAL SUPPLY — 42 items
BANDAGE ELASTIC 6 VELCRO ST LF (GAUZE/BANDAGES/DRESSINGS) ×2 IMPLANT
BLADE CUDA 5.5 (BLADE) IMPLANT
BLADE CUDA GRT WHITE 3.5 (BLADE) IMPLANT
BLADE CUTTER GATOR 3.5 (BLADE) ×2 IMPLANT
BLADE CUTTER MENIS 5.5 (BLADE) IMPLANT
BLADE GREAT WHITE 4.2 (BLADE) ×2 IMPLANT
BUR OVAL 4.0 (BURR) IMPLANT
CANISTER OMNI JUG 16 LITER (MISCELLANEOUS) ×2 IMPLANT
CANISTER SUCTION 2500CC (MISCELLANEOUS) IMPLANT
CLOTH BEACON ORANGE TIMEOUT ST (SAFETY) ×2 IMPLANT
CUTTER MENISCUS  4.2MM (BLADE)
CUTTER MENISCUS 4.2MM (BLADE) IMPLANT
DRAPE ARTHROSCOPY W/POUCH 90 (DRAPES) ×2 IMPLANT
DURAPREP 26ML APPLICATOR (WOUND CARE) ×2 IMPLANT
ELECT MENISCUS 165MM 90D (ELECTRODE) IMPLANT
ELECT REM PT RETURN 9FT ADLT (ELECTROSURGICAL)
ELECTRODE REM PT RTRN 9FT ADLT (ELECTROSURGICAL) IMPLANT
GAUZE XEROFORM 1X8 LF (GAUZE/BANDAGES/DRESSINGS) ×2 IMPLANT
GLOVE BIO SURGEON STRL SZ 6.5 (GLOVE) ×2 IMPLANT
GLOVE BIO SURGEON STRL SZ8 (GLOVE) ×2 IMPLANT
GLOVE BIOGEL PI IND STRL 7.0 (GLOVE) ×1 IMPLANT
GLOVE BIOGEL PI IND STRL 8.5 (GLOVE) ×1 IMPLANT
GLOVE BIOGEL PI INDICATOR 7.0 (GLOVE) ×1
GLOVE BIOGEL PI INDICATOR 8.5 (GLOVE) ×1
GLOVE EXAM NITRILE LRG STRL (GLOVE) ×2 IMPLANT
GLOVE ORTHO TXT STRL SZ7.5 (GLOVE) ×2 IMPLANT
GOWN PREVENTION PLUS XLARGE (GOWN DISPOSABLE) ×2 IMPLANT
GOWN PREVENTION PLUS XXLARGE (GOWN DISPOSABLE) ×2 IMPLANT
GOWN STRL REIN 2XL XLG LVL4 (GOWN DISPOSABLE) IMPLANT
HOLDER KNEE FOAM BLUE (MISCELLANEOUS) ×2 IMPLANT
IV NS IRRIG 3000ML ARTHROMATIC (IV SOLUTION) ×4 IMPLANT
KNEE WRAP E Z 3 GEL PACK (MISCELLANEOUS) ×2 IMPLANT
PACK ARTHROSCOPY DSU (CUSTOM PROCEDURE TRAY) ×2 IMPLANT
PACK BASIN DAY SURGERY FS (CUSTOM PROCEDURE TRAY) ×2 IMPLANT
PENCIL BUTTON HOLSTER BLD 10FT (ELECTRODE) IMPLANT
SET ARTHROSCOPY TUBING (MISCELLANEOUS) ×2
SET ARTHROSCOPY TUBING LN (MISCELLANEOUS) ×1 IMPLANT
SPONGE GAUZE 4X4 12PLY (GAUZE/BANDAGES/DRESSINGS) ×4 IMPLANT
SUT ETHILON 3 0 PS 1 (SUTURE) ×2 IMPLANT
SUT VIC AB 3-0 FS2 27 (SUTURE) IMPLANT
TOWEL OR 17X24 6PK STRL BLUE (TOWEL DISPOSABLE) ×2 IMPLANT
WATER STERILE IRR 1000ML POUR (IV SOLUTION) ×2 IMPLANT

## 2013-04-25 NOTE — Interval H&P Note (Signed)
History and Physical Interval Note:  04/25/2013 7:28 AM  [name] [name]  has presented today for surgery, with the diagnosis of RIGHT KNEE MEDIAL MENISCAL TEAR 836  The various methods of treatment have been discussed with the patient and family. After consideration of risks, benefits and other options for treatment, the patient has consented to  Procedure(s): KNEE ARTHROSCOPY WITH MEDIAL MENISECTOMY (Right) as a surgical intervention .  The patient's history has been reviewed, patient examined, no change in status, stable for surgery.  I have reviewed the patient's chart and labs.  Questions were answered to the patient's satisfaction.     Zahid Carneiro F

## 2013-04-25 NOTE — Anesthesia Procedure Notes (Signed)
Procedure Name: LMA Insertion Performed by: York Grice Pre-anesthesia Checklist: Patient identified, Emergency Drugs available, Suction available, Patient being monitored and Timeout performed Patient Re-evaluated:Patient Re-evaluated prior to inductionOxygen Delivery Method: Circle system utilized Preoxygenation: Pre-oxygenation with 100% oxygen Intubation Type: IV induction Ventilation: Mask ventilation without difficulty LMA: LMA flexible inserted LMA Size: 4.0 Number of attempts: 1 Placement Confirmation: breath sounds checked- equal and bilateral and positive ETCO2 Tube secured with: Tape Dental Injury: Teeth and Oropharynx as per pre-operative assessment

## 2013-04-25 NOTE — Anesthesia Preprocedure Evaluation (Addendum)
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   Neuro/Psych  Headaches,    GI/Hepatic GERD-  ,Increased LFTs by report   Endo/Other  Morbid obesity  Renal/GU      Musculoskeletal  (+) Arthritis -,   Abdominal (+) + obese,   Peds  Hematology   Anesthesia Other Findings   Reproductive/Obstetrics                          Anesthesia Physical Anesthesia Plan  ASA: III  Anesthesia Plan: General   Post-op Pain Management:    Induction: Intravenous  Airway Management Planned: LMA  Additional Equipment:   Intra-op Plan:   Post-operative Plan: Extubation in OR  Informed Consent: I have reviewed the patients History and Physical, chart, labs and discussed the procedure including the risks, benefits and alternatives for the proposed anesthesia with the patient or authorized representative who has indicated his/her understanding and acceptance.     Plan Discussed with:   Anesthesia Plan Comments:        Anesthesia Quick Evaluation

## 2013-04-25 NOTE — Anesthesia Postprocedure Evaluation (Signed)
  Anesthesia Post-op Note  Patient: [name] [name]  Procedure(s) Performed: Procedure(s): KNEE ARTHROSCOPY WITH MEDIAL MENISECTOMY, CHONDROPLASTY (Right)  Patient Location: PACU  Anesthesia Type:General  Level of Consciousness: awake and alert   Airway and Oxygen Therapy: Patient Spontanous Breathing  Post-op Pain: mild  Post-op Assessment: Post-op Vital signs reviewed  Post-op Vital Signs: stable  Complications: No apparent anesthesia complications

## 2013-04-25 NOTE — Transfer of Care (Signed)
Immediate Anesthesia Transfer of Care Note  Patient: [name] [name]  Procedure(s) Performed: Procedure(s): KNEE ARTHROSCOPY WITH MEDIAL MENISECTOMY, CHONDROPLASTY (Right)  Patient Location: PACU  Anesthesia Type:General  Level of Consciousness: awake and sedated  Airway & Oxygen Therapy: Patient Spontanous Breathing and Patient connected to face mask oxygen  Post-op Assessment: Report given to PACU RN and Post -op Vital signs reviewed and stable  Post vital signs: Reviewed and stable  Complications: No apparent anesthesia complications

## 2013-04-26 ENCOUNTER — Encounter (HOSPITAL_BASED_OUTPATIENT_CLINIC_OR_DEPARTMENT_OTHER): Payer: Self-pay | Admitting: Orthopedic Surgery

## 2013-04-26 NOTE — Op Note (Signed)
NAMEAMIR, FICK NO.:  [id number]  MEDICAL RECORD NO.:  [id number]  LOCATION:                                FACILITY:  [facility]  PHYSICIAN:  Loreta Ave, M.D. DATE OF BIRTH:  1946-07-14  DATE OF PROCEDURE:  04/25/2013 DATE OF DISCHARGE:  04/25/2013                              OPERATIVE REPORT   PREOPERATIVE DIAGNOSES:  Right knee medial meniscus tear.  Medial collateral ligament sprain.  POSTOPERATIVE DIAGNOSES:  Right knee medial meniscus tear.  Medial collateral ligament sprain with also synovitis and anteromedial adhesions.  Grade 2 and 3 chondral changes, medial femoral condyle posttraumatic.  PROCEDURE:  Right knee exam under anesthesia, arthroscopy.  Lysis of debridement of adhesions.  Partial medial meniscectomy.  Chondroplasty, medial compartment.  SURGEON:  Loreta Ave, M.D.  ASSISTANT:  Domingo Cocking, PA  ANESTHESIA:  General.  BLOOD LOSS:  Minimal.  SPECIMENS:  None.  CULTURES:  None.  COMPLICATIONS:  None.  DRESSINGS:  Soft compressive.  TOURNIQUET:  Not employed.  PROCEDURE:  The patient was brought to the operating room, placed on the operating table in supine position.  After adequate anesthesia had been obtained, knee examined.  Opens a little bit to MCL stress in flexion. Still a good end point, not grossly loose.  Ligament stable.  Leg holder applied.  Leg prepped and draped in usual sterile fashion.  Two portals, one each medial and lateral parapatellar.  Arthroscope was introduced, knee distended and inspected.  Good patellofemoral tracking and cartilage tear.  Cruciate ligaments intact.  Lateral meniscus and lateral compartment looked good.  Medially, there was a frayed flap tears.  Complex traumatic middle anterior third medial meniscus saucerized out, tapered in smoothly.  Some changes in the back with a little bit of snuffing under the scapula meniscus posterior third. Debrided.  Nothing enough required in  its stature or further meniscectomy.  Some mild grade 3 changes on the condyle debrided. Adhesion synovitis, medial, anteromedial all lysed and cleared out. Entire knee examined.  Few small chondral loose bodies removed.  No other findings.  Instruments and fluid were removed.  Portals were closed with nylon.  Knee injected with Depo-Medrol and Marcaine. Anesthesia reversed.  Brought to the recovery room.  Tolerated the surgery well.  No complications.     Loreta Ave, M.D.     DFM/MEDQ  D:  04/25/2013  T:  04/26/2013  Job:  [id number]

## 2013-06-06 ENCOUNTER — Other Ambulatory Visit: Payer: Self-pay

## 2013-06-24 ENCOUNTER — Other Ambulatory Visit: Payer: Self-pay | Admitting: Sports Medicine

## 2013-06-24 MED ORDER — SERTRALINE HCL 100 MG PO TABS: 100.0000 mg | ORAL_TABLET | Freq: Every day | ORAL | Status: DC

## 2013-07-02 ENCOUNTER — Ambulatory Visit (INDEPENDENT_AMBULATORY_CARE_PROVIDER_SITE_OTHER): Payer: Medicare HMO | Admitting: *Deleted

## 2013-07-02 DIAGNOSIS — Z23 Encounter for immunization: Secondary | ICD-10-CM

## 2013-07-04 ENCOUNTER — Telehealth: Payer: Self-pay | Admitting: *Deleted

## 2013-07-04 ENCOUNTER — Encounter: Payer: Self-pay | Admitting: Cardiology

## 2013-07-04 NOTE — Telephone Encounter (Signed)
Patient returned my call from earlier this morning.  Introduced myself as Animal nutritionist and confirmed 07/12/13 clinic date with a 7:45 arrival.  He indicated familiarity with [facility] and [facility].   Provided my phone # should he have any questions after receiving Information Packet and prior to clinic.  Will contact pt, again, prior to clinic.  Young Berry, RN, BSN, [facility] Prostate Oncology Navigator [phone]

## 2013-07-08 ENCOUNTER — Telehealth: Payer: Self-pay | Admitting: Oncology

## 2013-07-08 NOTE — Telephone Encounter (Signed)
C/D 07/08/13 for appt. 07/12/13

## 2013-07-09 ENCOUNTER — Encounter: Payer: Self-pay | Admitting: Radiation Oncology

## 2013-07-09 DIAGNOSIS — C61 Malignant neoplasm of prostate: Secondary | ICD-10-CM

## 2013-07-09 NOTE — Progress Notes (Signed)
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 Also, has a hx of prostatitis treated in September of 97.  Biopsies of prostate (if applicable) revealed:    Past/Anticipated interventions by urology, if any: discussed active surveillance, surgical therapy, androgen deprivation. Referred to radiation oncology.  Past/Anticipated interventions by medical oncology, if any: None  Weight changes, if any: None noted.   Bowel/Bladder complaints, if any: hesitancy, weak, intermittent stream, nocturia x2, erectile dysfunction, denies dysuria or hematuria. Reports constipation related to effects of hydrocodone for knee   Nausea/Vomiting, if any: None noted  Pain issues, if any:  None noted  SAFETY ISSUES:  Prior radiation? NO  Pacemaker/ICD? NO  Possible current pregnancy? NO  Is the patient on methotrexate? NO  Current Complaints / other details:  67 year old male. 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

## 2013-07-11 ENCOUNTER — Encounter: Payer: Self-pay | Admitting: *Deleted

## 2013-07-11 NOTE — Progress Notes (Signed)
Called patient to see if he had any questions prior to tomorrow's Prostate MDC.  LVM to call me and reminder of the requested 7:45 arrival time.  Young Berry, RN, BSN, [facility] Prostate Oncology Navigator [phone]

## 2013-07-12 ENCOUNTER — Encounter: Payer: Self-pay | Admitting: Radiation Oncology

## 2013-07-12 ENCOUNTER — Ambulatory Visit
Admission: RE | Admit: 2013-07-12 | Discharge: 2013-07-12 | Disposition: A | Discharge status: Home / Self Care | Payer: Medicare HMO | Source: Ambulatory Visit | Attending: Radiation Oncology | Admitting: Radiation Oncology

## 2013-07-12 ENCOUNTER — Ambulatory Visit (HOSPITAL_BASED_OUTPATIENT_CLINIC_OR_DEPARTMENT_OTHER): Payer: Medicare HMO | Admitting: Oncology

## 2013-07-12 ENCOUNTER — Encounter: Payer: Self-pay | Admitting: *Deleted

## 2013-07-12 VITALS — BP 135/78 | HR 79 | Temp 97.1°F | Resp 18 | Ht 73.0 in | Wt 318.1 lb

## 2013-07-12 DIAGNOSIS — C61 Malignant neoplasm of prostate: Secondary | ICD-10-CM | POA: Insufficient documentation

## 2013-07-12 DIAGNOSIS — F329 Major depressive disorder, single episode, unspecified: Secondary | ICD-10-CM | POA: Insufficient documentation

## 2013-07-12 DIAGNOSIS — Z79899 Other long term (current) drug therapy: Secondary | ICD-10-CM | POA: Insufficient documentation

## 2013-07-12 DIAGNOSIS — I10 Essential (primary) hypertension: Secondary | ICD-10-CM | POA: Insufficient documentation

## 2013-07-12 DIAGNOSIS — H409 Unspecified glaucoma: Secondary | ICD-10-CM | POA: Insufficient documentation

## 2013-07-12 DIAGNOSIS — E785 Hyperlipidemia, unspecified: Secondary | ICD-10-CM | POA: Insufficient documentation

## 2013-07-12 DIAGNOSIS — F3289 Other specified depressive episodes: Secondary | ICD-10-CM | POA: Insufficient documentation

## 2013-07-12 DIAGNOSIS — K219 Gastro-esophageal reflux disease without esophagitis: Secondary | ICD-10-CM | POA: Insufficient documentation

## 2013-07-12 DIAGNOSIS — F411 Generalized anxiety disorder: Secondary | ICD-10-CM | POA: Insufficient documentation

## 2013-07-12 HISTORY — DX: Other specified postprocedural states: Z98.890

## 2013-07-12 HISTORY — DX: Unspecified malignant neoplasm of skin, unspecified: C44.90

## 2013-07-12 NOTE — Progress Notes (Signed)
[facility] Clinical Social Work Prostate Clinic Note  Clinical Social Work met with Pt at the end of clinic to review distress screen and assess for concerns. Pt reports to be a 4 on the measure of distress and shared several stressors that occurred over the past year. Pt shared several hospitalizations in the past year, his wife died from breast cancer about three years ago and he has had issues with depression. He feels his depression is managed well on the zoloft currently and he feels he has good supports from his three sons. CSW did explore some feelings of grief and loss that may surface due to coming back to the [facility] and ways to cope. CSW provided pt with resources and info available and he was very Adult nurse. 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 Pt denies other concerns and plans to reach out as needed.      Clinical Social Work interventions: Supportive listening Support center resource review.   Doreen Salvage, LCSW Clinical Social Worker [facility] Wednesday, Thursday and Friday Phone: [phone] Fax: [phone]

## 2013-07-12 NOTE — Addendum Note (Signed)
Encounter addended by: Crecencio Mc, MD on: 07/12/2013 12:02 PM<BR>     Documentation filed: Clinical Notes
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
## 2013-07-12 NOTE — Progress Notes (Signed)
Reports throbbing and stabbing right knee pain. Widowed. Wife died of cancer following treatment by Dr. Darnelle Catalan and Dr. Kathrynn Running. Has three sons, Ronold, Hardgrove, and Molli Hazard. Medications reviewed. Vitals WDL.

## 2013-07-12 NOTE — Progress Notes (Signed)
Please see consult note.  

## 2013-07-15 NOTE — Progress Notes (Signed)
Met with patient as part of Prostate [facility]DC.  Reintroduced my role as his navigator and encouraged him to call as he proceeds with treatments and appointments at [facility].  Provided the accompanying Care Plan Summary:                                        Care Plan Summary  Name:  [name] [name] DOB:  05/13/1946  Your Medical Team:   Urologist -  Dr. Heloise Purpura, [facility]  Radiation Oncologist - Dr. Margaretmary Dys, [facility]  Medical Oncologist - Dr. Eli Hose, [facility]  Recommendations: 1) Surgery OR 2) Radiation with androgen (hormone) deprivation therapy  * These recommendations are based on information available as of today's consult.      Recommendations may change depending on the results of further tests or exams.   Next Steps: 1) Call [facility] (or [name]) with your decision  When appointments need to be scheduled, you will be contacted by [facility] and/or [facility].  Questions? Please do not hesitate to call Young Berry, RN, BSN, [facility] at [phone] with any questions or concerns.  [name] is Radio broadcast assistant and is available to assist you while you're receiving your medical care at [facility].

## 2013-07-22 ENCOUNTER — Other Ambulatory Visit: Payer: Self-pay | Admitting: Urology

## 2013-08-26 ENCOUNTER — Encounter (HOSPITAL_COMMUNITY): Payer: Self-pay | Admitting: Pharmacy Technician

## 2013-08-27 ENCOUNTER — Encounter (HOSPITAL_COMMUNITY): Payer: Self-pay

## 2013-08-27 ENCOUNTER — Encounter (HOSPITAL_COMMUNITY)
Admission: RE | Admit: 2013-08-27 | Discharge: 2013-08-27 | Disposition: A | Discharge status: Home / Self Care | Payer: Medicare HMO | Source: Ambulatory Visit | Attending: Urology | Admitting: Urology

## 2013-08-27 DIAGNOSIS — Z01818 Encounter for other preprocedural examination: Secondary | ICD-10-CM | POA: Insufficient documentation

## 2013-08-27 DIAGNOSIS — Z01812 Encounter for preprocedural laboratory examination: Secondary | ICD-10-CM | POA: Insufficient documentation

## 2013-08-27 LAB — CBC
HEMATOCRIT: 44.6 % (ref 39.0–52.0)
HEMOGLOBIN: 14.9 g/dL (ref 13.0–17.0)
MCH: 28.5 pg (ref 26.0–34.0)
MCHC: 33.4 g/dL (ref 30.0–36.0)
MCV: 85.4 fL (ref 78.0–100.0)
Platelets: 254 10*3/uL (ref 150–400)
RBC: 5.22 MIL/uL (ref 4.22–5.81)
RDW: 14.5 % (ref 11.5–15.5)
WBC: 7.5 10*3/uL (ref 4.0–10.5)

## 2013-08-27 LAB — BASIC METABOLIC PANEL
BUN: 19 mg/dL (ref 6–23)
CHLORIDE: 101 meq/L (ref 96–112)
CO2: 25 meq/L (ref 19–32)
CREATININE: 1.08 mg/dL (ref 0.50–1.35)
Calcium: 8.8 mg/dL (ref 8.4–10.5)
GFR calc non Af Amer: 70 mL/min — ABNORMAL LOW (ref >90)
GFR, EST AFRICAN AMERICAN: 81 mL/min — AB (ref >90)
Glucose, Bld: 163 mg/dL — ABNORMAL HIGH (ref 70–99)
POTASSIUM: 4.3 meq/L (ref 3.7–5.3)
Sodium: 138 mEq/L (ref 137–147)

## 2013-08-27 LAB — ABO/RH: ABO/RH(D): O POS

## 2013-08-27 NOTE — Patient Instructions (Addendum)
[place]  08/27/2013   Your procedure is scheduled on:   -09-05-2013  Report to [place] at     0830   AM.  Call this number if you have problems the morning of surgery: [phone]  Or Presurgical Testing [phone](Nkosi Cortright) For Living Will and/or Health Care Power Attorney Forms: please provide copy for your medical record,may bring AM of surgery(Forms should be already notarized -we do not provide this service).  Remember: Follow any bowel prep instructions per MD office. For Cpap use: Bring mask and tubing only.   Do not eat food:After Midnight.  May have clear liquids:up to 6 Hours before arrival. Nothing after : 0500 am  Clear liquids include soda, tea, black coffee, apple or grape juice, broth.  Take these medicines the morning of surgery with A SIP OF WATER: Zetia. Sertraline. Tamsulosin.   Do not wear jewelry, make-up or nail polish.  Do not wear lotions, powders, or perfumes. You may wear deodorant.  Do not shave 12 hours prior to first CHG shower(legs and under arms).(face and neck okay.)  Do not bring valuables to the hospital.(Hospital is not responsible for lost valuables).  Contacts, dentures or removable bridgework, body piercing, hair pins may not be worn into surgery.  Leave suitcase in the car. After surgery it may be brought to your room.  For patients admitted to the hospital, checkout time is 11:00 AM the day of discharge.(Restricted visitors-Persons, age 55 or younger - may not visit at this time.)    Patients discharged the day of surgery will not be allowed to drive home. Must have responsible person with you x 24 hours once discharged.  Name and phone number of your driver: markelle, najarian [phone]  Special Instructions: CHG(Chlorhedine 4%-"Hibiclens","Betasept","Aplicare") Shower Use Special Wash: see special instructions.(avoid face and genitals)   Please read over the following fact sheets that you were given: Blood  Transfusion fact sheet, Incentive Spirometry Instruction.  Remember : Type/Screen "Blue armbands" - may not be removed once applied(would result in being retested AM of surgery, if removed).  Failure to follow these instructions may result in Cancellation of your surgery.   Patient signature_______________________________________________________

## 2013-08-27 NOTE — Progress Notes (Signed)
1-27-15Your Pt has screened with an elevated risk for obstructive sleep apnea using the Stop-Bang tool during a presurgical  Visit. A score of four or greater is an elevated risk.

## 2013-08-27 NOTE — Pre-Procedure Instructions (Addendum)
08-27-13 EKG/ CXR 8'14-Epic. Fax to Dr. Paulla Fore office Stop/Bang score =4 today.
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
## 2013-09-05 ENCOUNTER — Inpatient Hospital Stay (HOSPITAL_COMMUNITY)
Admission: RE | Admit: 2013-09-05 | Discharge: 2013-09-06 | DRG: 707 | Disposition: A | Discharge status: Home / Self Care | Payer: Medicare HMO | Source: Ambulatory Visit | Attending: Urology | Admitting: Urology

## 2013-09-05 ENCOUNTER — Inpatient Hospital Stay (HOSPITAL_COMMUNITY): Payer: Medicare HMO | Admitting: Certified Registered Nurse Anesthetist

## 2013-09-05 ENCOUNTER — Encounter (HOSPITAL_COMMUNITY)
Admission: RE | Disposition: A | Discharge status: Home / Self Care | Payer: Self-pay | Source: Ambulatory Visit | Attending: Urology

## 2013-09-05 ENCOUNTER — Encounter (HOSPITAL_COMMUNITY): Payer: Self-pay | Admitting: *Deleted

## 2013-09-05 ENCOUNTER — Encounter (HOSPITAL_COMMUNITY): Payer: Medicare HMO | Admitting: Certified Registered Nurse Anesthetist

## 2013-09-05 DIAGNOSIS — Z7982 Long term (current) use of aspirin: Secondary | ICD-10-CM

## 2013-09-05 DIAGNOSIS — I10 Essential (primary) hypertension: Secondary | ICD-10-CM | POA: Diagnosis present

## 2013-09-05 DIAGNOSIS — F411 Generalized anxiety disorder: Secondary | ICD-10-CM | POA: Diagnosis present

## 2013-09-05 DIAGNOSIS — R351 Nocturia: Secondary | ICD-10-CM | POA: Diagnosis present

## 2013-09-05 DIAGNOSIS — C61 Malignant neoplasm of prostate: Principal | ICD-10-CM | POA: Diagnosis present

## 2013-09-05 DIAGNOSIS — R39198 Other difficulties with micturition: Secondary | ICD-10-CM | POA: Diagnosis present

## 2013-09-05 DIAGNOSIS — N529 Male erectile dysfunction, unspecified: Secondary | ICD-10-CM | POA: Diagnosis present

## 2013-09-05 DIAGNOSIS — K219 Gastro-esophageal reflux disease without esophagitis: Secondary | ICD-10-CM | POA: Diagnosis present

## 2013-09-05 DIAGNOSIS — R3989 Other symptoms and signs involving the genitourinary system: Secondary | ICD-10-CM | POA: Diagnosis present

## 2013-09-05 DIAGNOSIS — Z6841 Body Mass Index (BMI) 40.0 and over, adult: Secondary | ICD-10-CM

## 2013-09-05 DIAGNOSIS — Z01812 Encounter for preprocedural laboratory examination: Secondary | ICD-10-CM

## 2013-09-05 HISTORY — PX: LYMPHADENECTOMY: SHX5960

## 2013-09-05 HISTORY — PX: ROBOT ASSISTED LAPAROSCOPIC RADICAL PROSTATECTOMY: SHX5141

## 2013-09-05 LAB — TYPE AND SCREEN
ABO/RH(D): O POS
ANTIBODY SCREEN: NEGATIVE

## 2013-09-05 LAB — HEMOGLOBIN AND HEMATOCRIT, BLOOD
HEMATOCRIT: 44.8 % (ref 39.0–52.0)
HEMOGLOBIN: 15.1 g/dL (ref 13.0–17.0)

## 2013-09-05 SURGERY — ROBOTIC ASSISTED LAPAROSCOPIC RADICAL PROSTATECTOMY LEVEL 3
Anesthesia: General | Site: Prostate

## 2013-09-05 MED ORDER — SUCCINYLCHOLINE CHLORIDE 20 MG/ML IJ SOLN
INTRAMUSCULAR | Status: AC
  Filled 2013-09-05: qty 1

## 2013-09-05 MED ORDER — CEFAZOLIN SODIUM 1-5 GM-% IV SOLN
1.0000 g | Freq: Three times a day (TID) | INTRAVENOUS | Status: AC
  Administered 2013-09-05 – 2013-09-06 (×2): 1 g via INTRAVENOUS
  Filled 2013-09-05 (×3): qty 50

## 2013-09-05 MED ORDER — GLYCOPYRROLATE 0.2 MG/ML IJ SOLN
INTRAMUSCULAR | Status: AC
  Filled 2013-09-05: qty 3

## 2013-09-05 MED ORDER — MORPHINE SULFATE 2 MG/ML IJ SOLN
2.0000 mg | INTRAMUSCULAR | Status: DC | PRN
  Administered 2013-09-05: 4 mg via INTRAVENOUS
  Administered 2013-09-05: 2 mg via INTRAVENOUS
  Filled 2013-09-05: qty 2
  Filled 2013-09-05: qty 1

## 2013-09-05 MED ORDER — SODIUM CHLORIDE 0.9 % IV BOLUS (SEPSIS)
1000.0000 mL | Freq: Once | INTRAVENOUS | Status: AC
  Administered 2013-09-05: 1000 mL via INTRAVENOUS

## 2013-09-05 MED ORDER — EZETIMIBE 10 MG PO TABS
10.0000 mg | ORAL_TABLET | Freq: Every morning | ORAL | Status: DC
  Administered 2013-09-06: 10 mg via ORAL
  Filled 2013-09-05: qty 1

## 2013-09-05 MED ORDER — HEPARIN SODIUM (PORCINE) 5000 UNIT/ML IJ SOLN
5000.0000 [IU] | Freq: Three times a day (TID) | INTRAMUSCULAR | Status: DC
  Administered 2013-09-05 – 2013-09-06 (×3): 5000 [IU] via SUBCUTANEOUS
  Filled 2013-09-05 (×6): qty 1

## 2013-09-05 MED ORDER — HYDROMORPHONE HCL PF 1 MG/ML IJ SOLN
0.2500 mg | INTRAMUSCULAR | Status: DC | PRN
  Administered 2013-09-05: 0.5 mg via INTRAVENOUS
  Administered 2013-09-05: 1 mg via INTRAVENOUS
  Administered 2013-09-05 (×4): 0.5 mg via INTRAVENOUS

## 2013-09-05 MED ORDER — DEXTROSE 5 % IV SOLN
3.0000 g | INTRAVENOUS | Status: AC
  Administered 2013-09-05: 3 g via INTRAVENOUS
  Filled 2013-09-05: qty 3000

## 2013-09-05 MED ORDER — SODIUM CHLORIDE 0.9 % IR SOLN
Status: DC | PRN
  Administered 2013-09-05: 1000 mL via INTRAVESICAL

## 2013-09-05 MED ORDER — DOCUSATE SODIUM 100 MG PO CAPS
100.0000 mg | ORAL_CAPSULE | Freq: Two times a day (BID) | ORAL | Status: DC
  Administered 2013-09-05 – 2013-09-06 (×2): 100 mg via ORAL
  Filled 2013-09-05 (×3): qty 1

## 2013-09-05 MED ORDER — DEXAMETHASONE SODIUM PHOSPHATE 10 MG/ML IJ SOLN
INTRAMUSCULAR | Status: AC
  Filled 2013-09-05: qty 1

## 2013-09-05 MED ORDER — SUCCINYLCHOLINE CHLORIDE 20 MG/ML IJ SOLN
INTRAMUSCULAR | Status: DC | PRN
  Administered 2013-09-05: 200 mg via INTRAVENOUS

## 2013-09-05 MED ORDER — PROPOFOL 10 MG/ML IV BOLUS
INTRAVENOUS | Status: AC
  Filled 2013-09-05: qty 20

## 2013-09-05 MED ORDER — GLYCOPYRROLATE 0.2 MG/ML IJ SOLN
INTRAMUSCULAR | Status: DC | PRN
  Administered 2013-09-05: .8 mg via INTRAVENOUS

## 2013-09-05 MED ORDER — ROCURONIUM BROMIDE 100 MG/10ML IV SOLN
INTRAVENOUS | Status: AC
  Filled 2013-09-05: qty 1

## 2013-09-05 MED ORDER — LACTATED RINGERS IV SOLN
INTRAVENOUS | Status: DC | PRN
  Administered 2013-09-05: 14:00:00

## 2013-09-05 MED ORDER — FENTANYL CITRATE 0.05 MG/ML IJ SOLN
INTRAMUSCULAR | Status: DC | PRN
  Administered 2013-09-05 (×3): 100 ug via INTRAVENOUS
  Administered 2013-09-05 (×2): 50 ug via INTRAVENOUS
  Administered 2013-09-05: 100 ug via INTRAVENOUS

## 2013-09-05 MED ORDER — HYDROMORPHONE HCL PF 1 MG/ML IJ SOLN
INTRAMUSCULAR | Status: DC | PRN
  Administered 2013-09-05 (×4): 0.5 mg via INTRAVENOUS

## 2013-09-05 MED ORDER — BUPIVACAINE-EPINEPHRINE 0.25% -1:200000 IJ SOLN
INTRAMUSCULAR | Status: DC | PRN
  Administered 2013-09-05: 30 mL

## 2013-09-05 MED ORDER — MIDAZOLAM HCL 2 MG/2ML IJ SOLN
INTRAMUSCULAR | Status: AC
  Filled 2013-09-05: qty 2

## 2013-09-05 MED ORDER — MIDAZOLAM HCL 2 MG/2ML IJ SOLN
1.0000 mg | INTRAMUSCULAR | Status: AC
  Administered 2013-09-05: 1 mg via INTRAVENOUS

## 2013-09-05 MED ORDER — LIDOCAINE HCL (CARDIAC) 20 MG/ML IV SOLN
INTRAVENOUS | Status: DC | PRN
  Administered 2013-09-05: 100 mg via INTRAVENOUS

## 2013-09-05 MED ORDER — HEPARIN SODIUM (PORCINE) 1000 UNIT/ML IJ SOLN
INTRAMUSCULAR | Status: AC
  Filled 2013-09-05: qty 1

## 2013-09-05 MED ORDER — ROCURONIUM BROMIDE 100 MG/10ML IV SOLN
INTRAVENOUS | Status: DC | PRN
  Administered 2013-09-05 (×2): 10 mg via INTRAVENOUS
  Administered 2013-09-05: 50 mg via INTRAVENOUS

## 2013-09-05 MED ORDER — NEOSTIGMINE METHYLSULFATE 1 MG/ML IJ SOLN
INTRAMUSCULAR | Status: AC
  Filled 2013-09-05: qty 10

## 2013-09-05 MED ORDER — FENTANYL CITRATE 0.05 MG/ML IJ SOLN
INTRAMUSCULAR | Status: AC
  Filled 2013-09-05: qty 5

## 2013-09-05 MED ORDER — HYDROMORPHONE HCL PF 1 MG/ML IJ SOLN
INTRAMUSCULAR | Status: AC
  Filled 2013-09-05: qty 1

## 2013-09-05 MED ORDER — DIPHENHYDRAMINE HCL 50 MG/ML IJ SOLN: 12.5000 mg | Freq: Four times a day (QID) | INTRAMUSCULAR | Status: DC | PRN

## 2013-09-05 MED ORDER — PROPOFOL 10 MG/ML IV BOLUS
INTRAVENOUS | Status: DC | PRN
  Administered 2013-09-05: 200 mg via INTRAVENOUS

## 2013-09-05 MED ORDER — LACTATED RINGERS IV SOLN: INTRAVENOUS | Status: DC

## 2013-09-05 MED ORDER — ONDANSETRON HCL 4 MG/2ML IJ SOLN
INTRAMUSCULAR | Status: AC
  Filled 2013-09-05: qty 2

## 2013-09-05 MED ORDER — STERILE WATER FOR IRRIGATION IR SOLN
Status: DC | PRN
  Administered 2013-09-05: 3000 mL

## 2013-09-05 MED ORDER — PROMETHAZINE HCL 25 MG/ML IJ SOLN: 6.2500 mg | INTRAMUSCULAR | Status: DC | PRN

## 2013-09-05 MED ORDER — LABETALOL HCL 5 MG/ML IV SOLN
INTRAVENOUS | Status: DC | PRN
  Administered 2013-09-05: 5 mg via INTRAVENOUS

## 2013-09-05 MED ORDER — CIPROFLOXACIN HCL 500 MG PO TABS: 500.0000 mg | ORAL_TABLET | Freq: Two times a day (BID) | ORAL | Status: DC

## 2013-09-05 MED ORDER — HYDROCODONE-ACETAMINOPHEN 5-325 MG PO TABS: 1.0000 | ORAL_TABLET | Freq: Four times a day (QID) | ORAL | Status: DC | PRN

## 2013-09-05 MED ORDER — HYDROMORPHONE HCL PF 1 MG/ML IJ SOLN: 0.2500 mg | INTRAMUSCULAR | Status: DC | PRN

## 2013-09-05 MED ORDER — HYDROCODONE-ACETAMINOPHEN 5-325 MG PO TABS
1.0000 | ORAL_TABLET | Freq: Four times a day (QID) | ORAL | Status: DC | PRN
  Administered 2013-09-05 – 2013-09-06 (×4): 2 via ORAL
  Filled 2013-09-05 (×4): qty 2

## 2013-09-05 MED ORDER — DEXAMETHASONE SODIUM PHOSPHATE 10 MG/ML IJ SOLN
INTRAMUSCULAR | Status: DC | PRN
  Administered 2013-09-05: 10 mg via INTRAVENOUS

## 2013-09-05 MED ORDER — MIDAZOLAM HCL 5 MG/5ML IJ SOLN
INTRAMUSCULAR | Status: DC | PRN
  Administered 2013-09-05: 2 mg via INTRAVENOUS

## 2013-09-05 MED ORDER — SERTRALINE HCL 100 MG PO TABS
100.0000 mg | ORAL_TABLET | Freq: Every morning | ORAL | Status: DC
  Administered 2013-09-06: 100 mg via ORAL
  Filled 2013-09-05: qty 1

## 2013-09-05 MED ORDER — KCL IN DEXTROSE-NACL 20-5-0.45 MEQ/L-%-% IV SOLN
INTRAVENOUS | Status: DC
  Administered 2013-09-05 – 2013-09-06 (×2): via INTRAVENOUS
  Filled 2013-09-05 (×4): qty 1000

## 2013-09-05 MED ORDER — LABETALOL HCL 5 MG/ML IV SOLN
INTRAVENOUS | Status: AC
  Filled 2013-09-05: qty 4

## 2013-09-05 MED ORDER — LIDOCAINE HCL (CARDIAC) 20 MG/ML IV SOLN
INTRAVENOUS | Status: AC
  Filled 2013-09-05: qty 5

## 2013-09-05 MED ORDER — DIPHENHYDRAMINE HCL 12.5 MG/5ML PO ELIX: 12.5000 mg | ORAL_SOLUTION | Freq: Four times a day (QID) | ORAL | Status: DC | PRN

## 2013-09-05 MED ORDER — HYDROMORPHONE HCL PF 2 MG/ML IJ SOLN
INTRAMUSCULAR | Status: AC
  Filled 2013-09-05: qty 1

## 2013-09-05 MED ORDER — FENTANYL CITRATE 0.05 MG/ML IJ SOLN
INTRAMUSCULAR | Status: AC
Start: 2013-09-05 — End: 2013-09-05
  Filled 2013-09-05: qty 5

## 2013-09-05 MED ORDER — ACETAMINOPHEN 325 MG PO TABS: 650.0000 mg | ORAL_TABLET | ORAL | Status: DC | PRN

## 2013-09-05 MED ORDER — ONDANSETRON HCL 4 MG/2ML IJ SOLN
INTRAMUSCULAR | Status: DC | PRN
  Administered 2013-09-05: 4 mg via INTRAVENOUS

## 2013-09-05 MED ORDER — LACTATED RINGERS IV SOLN
INTRAVENOUS | Status: DC | PRN
  Administered 2013-09-05 (×2): via INTRAVENOUS

## 2013-09-05 MED ORDER — BUPIVACAINE-EPINEPHRINE PF 0.25-1:200000 % IJ SOLN
INTRAMUSCULAR | Status: AC
  Filled 2013-09-05: qty 30

## 2013-09-05 MED ORDER — NEOSTIGMINE METHYLSULFATE 1 MG/ML IJ SOLN
INTRAMUSCULAR | Status: DC | PRN
  Administered 2013-09-05: 5 mg via INTRAVENOUS

## 2013-09-05 SURGICAL SUPPLY — 47 items
ADH SKN CLS APL DERMABOND .7 (GAUZE/BANDAGES/DRESSINGS) ×2
CABLE HIGH FREQUENCY MONO STRZ (ELECTRODE) ×4 IMPLANT
CANISTER SUCTION 2500CC (MISCELLANEOUS) ×4 IMPLANT
CATH FOLEY 2WAY SLVR 18FR 30CC (CATHETERS) ×4 IMPLANT
CATH ROBINSON RED A/P 16FR (CATHETERS) ×4 IMPLANT
CATH ROBINSON RED A/P 8FR (CATHETERS) ×4 IMPLANT
CATH TIEMANN FOLEY 18FR 5CC (CATHETERS) ×4 IMPLANT
CHLORAPREP W/TINT 26ML (MISCELLANEOUS) ×4 IMPLANT
CLIP LIGATING HEM O LOK PURPLE (MISCELLANEOUS) ×8 IMPLANT
CLOTH BEACON ORANGE TIMEOUT ST (SAFETY) ×4 IMPLANT
COVER SURGICAL LIGHT HANDLE (MISCELLANEOUS) ×4 IMPLANT
COVER TIP SHEARS 8 DVNC (MISCELLANEOUS) ×2 IMPLANT
COVER TIP SHEARS 8MM DA VINCI (MISCELLANEOUS) ×2
CUTTER ECHEON FLEX ENDO 45 340 (ENDOMECHANICALS) ×4 IMPLANT
DECANTER SPIKE VIAL GLASS SM (MISCELLANEOUS) ×4 IMPLANT
DERMABOND ADVANCED (GAUZE/BANDAGES/DRESSINGS) ×2
DERMABOND ADVANCED .7 DNX12 (GAUZE/BANDAGES/DRESSINGS) ×2 IMPLANT
DRAPE SURG IRRIG POUCH 19X23 (DRAPES) ×4 IMPLANT
DRSG TEGADERM 4X4.75 (GAUZE/BANDAGES/DRESSINGS) ×4 IMPLANT
DRSG TEGADERM 6X8 (GAUZE/BANDAGES/DRESSINGS) ×8 IMPLANT
ELECT REM PT RETURN 9FT ADLT (ELECTROSURGICAL) ×4
ELECTRODE REM PT RTRN 9FT ADLT (ELECTROSURGICAL) ×2 IMPLANT
GAUZE SPONGE 2X2 8PLY STRL LF (GAUZE/BANDAGES/DRESSINGS) ×2 IMPLANT
GLOVE BIO SURGEON STRL SZ 6.5 (GLOVE) ×3 IMPLANT
GLOVE BIO SURGEONS STRL SZ 6.5 (GLOVE) ×1
GLOVE BIOGEL M STRL SZ7.5 (GLOVE) ×8 IMPLANT
GOWN STRL REUS W/TWL LRG LVL3 (GOWN DISPOSABLE) ×4 IMPLANT
GOWN STRL REUS W/TWL XL LVL3 (GOWN DISPOSABLE) ×8 IMPLANT
HEMOSTAT SURGICEL 4X8 (HEMOSTASIS) ×4 IMPLANT
HOLDER FOLEY CATH W/STRAP (MISCELLANEOUS) ×4 IMPLANT
IV LACTATED RINGERS 1000ML (IV SOLUTION) ×4 IMPLANT
KIT ACCESSORY DA VINCI DISP (KITS) ×2
KIT ACCESSORY DVNC DISP (KITS) ×2 IMPLANT
NDL SAFETY ECLIPSE 18X1.5 (NEEDLE) ×2 IMPLANT
NEEDLE HYPO 18GX1.5 SHARP (NEEDLE) ×4
PACK ROBOT UROLOGY CUSTOM (CUSTOM PROCEDURE TRAY) ×4 IMPLANT
RELOAD GREEN ECHELON 45 (STAPLE) ×4 IMPLANT
SET TUBE IRRIG SUCTION NO TIP (IRRIGATION / IRRIGATOR) ×4 IMPLANT
SOLUTION ELECTROLUBE (MISCELLANEOUS) ×4 IMPLANT
SPONGE GAUZE 2X2 STER 10/PKG (GAUZE/BANDAGES/DRESSINGS) ×2
SUT ETHILON 3 0 PS 1 (SUTURE) ×4 IMPLANT
SUT MNCRL AB 4-0 PS2 18 (SUTURE) ×8 IMPLANT
SUT VICRYL 0 UR6 27IN ABS (SUTURE) ×8 IMPLANT
SYR 27GX1/2 1ML LL SAFETY (SYRINGE) ×4 IMPLANT
TOWEL OR 17X26 10 PK STRL BLUE (TOWEL DISPOSABLE) ×4 IMPLANT
TOWEL OR NON WOVEN STRL DISP B (DISPOSABLE) ×4 IMPLANT
WATER STERILE IRR 1500ML POUR (IV SOLUTION) ×8 IMPLANT

## 2013-09-05 NOTE — Transfer of Care (Signed)
Immediate Anesthesia Transfer of Care Note  Patient: [name] [name]  Procedure(s) Performed: Procedure(s) (LRB): ROBOTIC ASSISTED LAPAROSCOPIC RADICAL PROSTATECTOMY LEVEL 3 (N/A) LYMPHADENECTOMY (Bilateral)  Patient Location: PACU  Anesthesia Type: General  Level of Consciousness: sedated, patient cooperative and responds to stimulation  Airway & Oxygen Therapy: Patient Spontanous Breathing and Patient connected to face mask oxgen  Post-op Assessment: Report given to PACU RN and Post -op Vital signs reviewed and stable  Post vital signs: Reviewed and stable  Complications: No apparent anesthesia complications

## 2013-09-05 NOTE — Interval H&P Note (Signed)
History and Physical Interval Note:  09/05/2013 11:46 AM  [name] [name]  has presented today for surgery, with the diagnosis of PROSTATE CANCER  The various methods of treatment have been discussed with the patient and family. After consideration of risks, benefits and other options for treatment, the patient has consented to  Procedure(s): ROBOTIC ASSISTED LAPAROSCOPIC RADICAL PROSTATECTOMY LEVEL 3 (N/A) LYMPHADENECTOMY (Bilateral) as a surgical intervention .  The patient's history has been reviewed, patient examined, no change in status, stable for surgery.  I have reviewed the patient's chart and labs.  Questions were answered to the patient's satisfaction.     Yoselin Amerman,LES

## 2013-09-05 NOTE — Anesthesia Postprocedure Evaluation (Signed)
Anesthesia Post Note  Patient: [name] [name]  Procedure(s) Performed: Procedure(s) (LRB): ROBOTIC ASSISTED LAPAROSCOPIC RADICAL PROSTATECTOMY LEVEL 3 (N/A) LYMPHADENECTOMY (Bilateral)  Anesthesia type: General  Patient location: PACU  Post pain: Pain level controlled  Post assessment: Post-op Vital signs reviewed  Last Vitals:  Filed Vitals:   09/05/13 1715  BP: 158/75  Pulse: 81  Temp: 36.8 C  Resp: 18    Post vital signs: Reviewed  Level of consciousness: sedated  Complications: No apparent anesthesia complications

## 2013-09-05 NOTE — Discharge Instructions (Signed)
1. Activity:  You are encouraged to ambulate frequently (about every hour during waking hours) to help prevent blood clots from forming in your legs or lungs.  However, you should not engage in any heavy lifting (> 10-15 lbs), strenuous activity, or straining. °2. Diet: You should continue a clear liquid diet until passing gas from below.  Once this occurs, you may advance your diet to a soft diet that would be easy to digest (i.e soups, scrambled eggs, mashed potatoes, etc.) for 24 hours just as you would if getting over a bad stomach flu.  If tolerating this diet well for 24 hours, you may then begin eating regular food.  It will be normal to have some amount of bloating, nausea, and abdominal discomfort intermittently. °3. Prescriptions:  You will be provided a prescription for pain medication to take as needed.  If your pain is not severe enough to require the prescription pain medication, you may take extra strength Tylenol instead.  You should also take an over the counter stool softener (Colace 100 mg twice daily) to avoid straining with bowel movements as the pain medication may constipate you. Finally, you will also be provided a prescription for an antibiotic to begin the day prior to your return visit in the office for catheter removal. °4. Catheter care: You will be taught how to take care of the catheter by the nursing staff prior to discharge from the hospital.  You may use both a leg bag and the larger bedside bag but it is recommended to at least use the bigger bedside bag at nighttime as the leg bag is small and will fill up overnight and also does not drain as well when lying flat. You may periodically feel a strong urge to void with the catheter in place.  This is a bladder spasm and most often can occur when having a bowel movement or when you are moving around. It is typically self-limited and usually will stop after a few minutes.  You may use some Vaseline or Neosporin around the tip of the  catheter to reduce friction at the tip of the penis. °5. Incisions: You may remove your dressing bandages the 2nd day after surgery.  You most likely will have a few small staples in each of the incisions and once the bandages are removed, the incisions may stay open to air.  You may start showering (not soaking or bathing in water) 48 hours after surgery and the incisions simply need to be patted dry after the shower.  No additional care is needed. °6. What to call us about: You should call the office ([phone]) if you develop fever > 101, persistent vomiting, or the catheter stops draining. Also, feel free to call with any other questions you may have and remember the handout that was provided to you as a reference preoperatively which answers many of the common questions that arise after surgery. ° °You may resume aspirin, vitamins, and supplements 7 days after surgery. °

## 2013-09-05 NOTE — Op Note (Signed)
Preoperative diagnosis: Clinically localized adenocarcinoma of the prostate (clinical stage T1c Nx Mx)  Postoperative diagnosis: Clinically localized adenocarcinoma of the prostate (clinical stage T1c Nx Mx)  Procedure:  1. Robotic assisted laparoscopic radical prostatectomy (bilateral nerve sparing) 2. Bilateral robotic assisted laparoscopic pelvic lymphadenectomy  Surgeon: Pryor Curia. M.D.  Assistant: Leta Baptist, PA-C  Anesthesia: General  Complications: None  EBL: 150 mL  IVF:  2000 mL crystalloid  Specimens: 1. Prostate and seminal vesicles 2. Right pelvic lymph nodes 3. Left pelvic lymph nodes  Disposition of specimens: Pathology  Drains: 1. 20 Fr coude catheter 2. # 19 Blake pelvic drain  Indication: [name] [name] is a 68 y.o. year old patient with clinically localized prostate cancer.  After a thorough review of the management options for treatment of prostate cancer, he elected to proceed with surgical therapy and the above procedure(s).  We have discussed the potential benefits and risks of the procedure, side effects of the proposed treatment, the likelihood of the patient achieving the goals of the procedure, and any potential problems that might occur during the procedure or recuperation. Informed consent has been obtained.  Description of procedure:  The patient was taken to the operating room and a general anesthetic was administered. He was given preoperative antibiotics, placed in the dorsal lithotomy position, and prepped and draped in the usual sterile fashion. Next a preoperative timeout was performed. A urethral catheter was placed into the bladder and a site was selected near the umbilicus for placement of the camera port. This was placed using a standard open Hassan technique which allowed entry into the peritoneal cavity under direct vision and without difficulty. A 12 mm port was placed and a pneumoperitoneum established. The camera was  then used to inspect the abdomen and there was no evidence of any intra-abdominal injuries or other abnormalities. The remaining abdominal ports were then placed. 8 mm robotic ports were placed in the right lower quadrant, left lower quadrant, and far left lateral abdominal wall. A 5 mm port was placed in the right upper quadrant and a 12 mm port was placed in the right lateral abdominal wall for laparoscopic assistance. All ports were placed under direct vision without difficulty. The surgical cart was then docked.   Utilizing the cautery scissors, the bladder was reflected posteriorly allowing entry into the space of Retzius and identification of the endopelvic fascia and prostate. The periprostatic fat was then removed from the prostate allowing full exposure of the endopelvic fascia. The endopelvic fascia was then incised from the apex back to the base of the prostate bilaterally and the underlying levator muscle fibers were swept laterally off the prostate thereby isolating the dorsal venous complex. The dorsal vein was then stapled and divided with a 45 mm Flex Echelon stapler. Attention then turned to the bladder neck which was divided anteriorly thereby allowing entry into the bladder and exposure of the urethral catheter. The catheter balloon was deflated and the catheter was brought into the operative field and used to retract the prostate anteriorly. The posterior bladder neck was then examined and was divided allowing further dissection between the bladder and prostate posteriorly until the vasa deferentia and seminal vessels were identified. The vasa deferentia were isolated, divided, and lifted anteriorly. The seminal vesicles were dissected down to their tips with care to control the seminal vascular arterial blood supply. These structures were then lifted anteriorly and the space between Denonvillier's fascia and the anterior rectum was developed with a combination  of sharp and blunt dissection.  This isolated the vascular pedicles of the prostate.  The lateral prostatic fascia was then sharply incised allowing release of the neurovascular bundles bilaterally. The vascular pedicles of the prostate were then ligated with Weck clips between the prostate and neurovascular bundles and divided with sharp cold scissor dissection resulting in neurovascular bundle preservation. The neurovascular bundles were then separated off the apex of the prostate and urethra bilaterally.  The urethra was then sharply transected allowing the prostate specimen to be disarticulated. The pelvis was copiously irrigated and hemostasis was ensured. There was no evidence for rectal injury.  Attention then turned to the right pelvic sidewall. The fibrofatty tissue between the external iliac vein, confluence of the iliac vessels, hypogastric artery, and Cooper's ligament was dissected free from the pelvic sidewall with care to preserve the obturator nerve. Weck clips were used for lymphostasis and hemostasis. An identical procedure was performed on the contralateral side and the lymphatic packets were removed for permanent pathologic analysis.  Attention then turned to the urethral anastomosis. A 2-0 Vicryl slip knot was placed between Denonvillier's fascia, the posterior bladder neck, and the posterior urethra to reapproximate these structures. A double-armed 3-0 Monocryl suture was then used to perform a 360 running tension-free anastomosis between the bladder neck and urethra. A new urethral catheter was then placed into the bladder and irrigated. There were no blood clots within the bladder and the anastomosis appeared to be watertight. A #19 Blake drain was then brought through the left lateral 8 mm port site and positioned appropriately within the pelvis. It was secured to the skin with a nylon suture. The surgical cart was then undocked. The right lateral 12 mm port site was closed at the fascial level with a 0 Vicryl  suture placed laparoscopically. All remaining ports were then removed under direct vision. The prostate specimen was removed intact within the Endopouch retrieval bag via the periumbilical camera port site. This fascial opening was closed with two running 0 Vicryl sutures. 0.25% Marcaine was then injected into all port sites and all incisions were reapproximated at the skin level with 4-0 Monocryl subcuticular sutures and Dermabond. The patient appeared to tolerate the procedure well and without complications. The patient was able to be extubated and transferred to the recovery unit in satisfactory condition.   Pryor Curia MD

## 2013-09-05 NOTE — Preoperative (Signed)
Beta Blockers   Reason not to administer Beta Blockers:Not Applicable 

## 2013-09-05 NOTE — Progress Notes (Signed)
Patient ID: [name] [name], male   DOB: November 16, 1945, 68 y.o.   MRN: [id number]  Post-op note  Subjective: The patient is doing well.  No complaints.  Objective: Vital signs in last 24 hours: Temp:  [97.6 F (36.4 C)-98.2 F (36.8 C)] 98.2 F (36.8 C) (02/05 1715) Pulse Rate:  [71-81] 81 (02/05 1715) Resp:  [14-20] 18 (02/05 1715) BP: (126-183)/(68-86) 158/75 mmHg (02/05 1715) SpO2:  [93 %-100 %] 99 % (02/05 1715) Weight:  [148.48 kg (327 lb 5.4 oz)] 148.48 kg (327 lb 5.4 oz) (02/05 1724)  Intake/Output from previous day:   Intake/Output this shift: Total I/O In: 3700 [I.V.:2700; IV Piggyback:1000] Out: 260 [Urine:50; Drains:60; Blood:150]  Physical Exam:  General: Alert and oriented. Abdomen: Soft, Nondistended. Incisions: Clean and dry. GU: Urine red but draining well.  Lab Results:  Recent Labs  09/05/13 1614  HGB 15.1  HCT 44.8    Assessment/Plan: POD#0   1) Continue to monitor 2) Ambulate   Pryor Curia. MD   LOS: 0 days   Shannah Conteh,LES 09/05/2013, 5:56 PM

## 2013-09-05 NOTE — Anesthesia Preprocedure Evaluation (Signed)
Anesthesia Evaluation  Patient identified by MRN, date of birth, ID band Patient awake    Reviewed: Allergy & Precautions, H&P , NPO status , Patient's Chart, lab work & pertinent test results  Airway Mallampati: II TM Distance: >3 FB Neck ROM: Full    Dental  (+) Poor Dentition, Dental Advisory Given and Partial Upper   Pulmonary neg pulmonary ROS, asthma , pneumonia -,  breath sounds clear to auscultation  Pulmonary exam normal       Cardiovascular hypertension, Pt. on medications Rhythm:Regular Rate:Normal     Neuro/Psych  Headaches, Anxiety negative neurological ROS  negative psych ROS   GI/Hepatic Neg liver ROS, GERD-  ,  Endo/Other  Morbid obesity  Renal/GU negative Renal ROS  negative genitourinary   Musculoskeletal negative musculoskeletal ROS (+)   Abdominal   Peds  Hematology negative hematology ROS (+)   Anesthesia Other Findings   Reproductive/Obstetrics negative OB ROS                           Anesthesia Physical Anesthesia Plan  ASA: III  Anesthesia Plan: General   Post-op Pain Management:    Induction: Intravenous  Airway Management Planned: Oral ETT  Additional Equipment:   Intra-op Plan:   Post-operative Plan: Extubation in OR  Informed Consent: I have reviewed the patients History and Physical, chart, labs and discussed the procedure including the risks, benefits and alternatives for the proposed anesthesia with the patient or authorized representative who has indicated his/her understanding and acceptance.   Dental advisory given  Plan Discussed with: CRNA  Anesthesia Plan Comments:         Anesthesia Quick Evaluation

## 2013-09-06 ENCOUNTER — Encounter (HOSPITAL_COMMUNITY): Payer: Self-pay | Admitting: Urology

## 2013-09-06 LAB — HEMOGLOBIN AND HEMATOCRIT, BLOOD
HEMATOCRIT: 40 % (ref 39.0–52.0)
Hemoglobin: 13.5 g/dL (ref 13.0–17.0)

## 2013-09-06 MED ORDER — MENTHOL 3 MG MT LOZG
1.0000 | LOZENGE | OROMUCOSAL | Status: DC | PRN
  Filled 2013-09-06 (×2): qty 9

## 2013-09-06 MED ORDER — BISACODYL 10 MG RE SUPP
10.0000 mg | Freq: Once | RECTAL | Status: AC
  Administered 2013-09-06: 10 mg via RECTAL
  Filled 2013-09-06: qty 1

## 2013-09-06 MED ORDER — BELLADONNA ALKALOIDS-OPIUM 16.2-60 MG RE SUPP
1.0000 | Freq: Four times a day (QID) | RECTAL | Status: DC | PRN
  Administered 2013-09-06: 1 via RECTAL
  Filled 2013-09-06: qty 1

## 2013-09-06 MED ORDER — PHENOL 1.4 % MT LIQD
1.0000 | OROMUCOSAL | Status: DC | PRN
  Filled 2013-09-06 (×2): qty 177

## 2013-09-06 NOTE — Progress Notes (Signed)
Patient ID: [name] [name], male   DOB: 1946-02-19, 68 y.o.   MRN: [id number]  1 Day Post-Op Subjective: The patient is doing well.  No nausea or vomiting. Pain is adequately controlled.  Objective: Vital signs in last 24 hours: Temp:  [97.6 F (36.4 C)-98.2 F (36.8 C)] 97.9 F (36.6 C) (02/06 0503) Pulse Rate:  [70-81] 70 (02/06 0503) Resp:  [14-20] 16 (02/06 0503) BP: (126-183)/(56-86) 142/56 mmHg (02/06 0503) SpO2:  [93 %-100 %] 97 % (02/06 0503) Weight:  [148.48 kg (327 lb 5.4 oz)] 148.48 kg (327 lb 5.4 oz) (02/05 1724)  Intake/Output from previous day: 02/05 0701 - 02/06 0700 In: 6097.5 [P.O.:360; I.V.:4637.5; IV Piggyback:1100] Out: 8413 [Urine:1450; Drains:190; Blood:150] Intake/Output this shift:    Physical Exam:  General: Alert and oriented. CV: RRR Lungs: Clear bilaterally. GI: Soft, Nondistended. Incisions: C/D/I Urine: Clear Extremities: Nontender, no erythema, no edema.  Lab Results:  Recent Labs  09/05/13 1614 09/06/13 0430  HGB 15.1 13.5  HCT 44.8 40.0      Assessment/Plan: POD# 1 s/p robotic prostatectomy.  1) SL IVF 2) Ambulate, Incentive spirometry 3) Transition to oral pain medication 4) Dulcolax suppository 5) D/C pelvic drain 6) Plan for likely discharge later today   [name] [name]. MD   LOS: 1 day   [name] [name],LES 09/06/2013, 7:28 AM

## 2013-09-06 NOTE — Progress Notes (Signed)
Utilization review completed.  

## 2013-09-06 NOTE — Care Management Note (Addendum)
    Page 1 of 1   09/06/2013     3:19:01 PM   CARE MANAGEMENT NOTE 09/06/2013  Patient:  HOMAR, WEINKAUF   Account Number:  [id number]  Date Initiated:  09/06/2013  Documentation initiated by:  Dessa Phi  Subjective/Objective Assessment:   68 Y/O M ADMITTED W/PROSTATE CA.     Action/Plan:   FROM HOME.HAS PCP,PHARMACY.   Anticipated DC Date:  09/06/2013   Anticipated DC Plan:  [place]  CM consult      Choice offered to / List presented to:             Status of service:  Completed, signed off Medicare Important Message given?   (If response is "NO", the following Medicare IM given date fields will be blank) Date Medicare IM given:   Date Additional Medicare IM given:    Discharge Disposition:  HOME/SELF CARE  Per UR Regulation:  Reviewed for med. necessity/level of care/duration of stay  If discussed at [facility] of Stay Meetings, dates discussed:    Comments:  09/06/13 Quita Mcgrory RN,BSN NCM [phone] POD#1 ROB PROSTATECTOMY.NO ANTICIPATED D/C NEEDS.

## 2013-09-09 NOTE — Discharge Summary (Signed)
Date of admission: 09/05/2013  Date of discharge: 09/09/2013  Admission diagnosis: Prostate Cancer  Discharge diagnosis: Prostate Cancer  History and Physical: For full details, please see admission history and physical. Briefly, [name] [name] is a 67 y.o. gentleman with localized prostate cancer.  After discussing management/treatment options, he elected to proceed with surgical treatment.  Hospital Course: [name] [name] was taken to the operating room on 09/05/2013 and underwent a robotic assisted laparoscopic radical prostatectomy. He tolerated this procedure well and without complications. Postoperatively, he was able to be transferred to a regular hospital room following recovery from anesthesia.  He was able to begin ambulating the night of surgery. He remained hemodynamically stable overnight.  He had excellent urine output with appropriately minimal output from his pelvic drain and his pelvic drain was removed on POD #1.  He did have some bladder spasms on POD 1 which were successfully treated with B/O suppositories.  He complained of numbness surrounding his left ear.  He stated he first noted it around 3am on the operative night and it persisted into POD 1.  He denies any other sx and he was completely neurologically intact.  His hearing was unaffected.  There was no obvious injury to his ear or the side of his head.  He was transitioned to oral pain medication, tolerated a clear liquid diet, and had met all discharge criteria and was able to be discharged home later on POD#1.  Laboratory values:  CBC    Component Value Date/Time   WBC 7.5 08/27/2013 1110   RBC 5.22 08/27/2013 1110   HGB 13.5 09/06/2013 0430   HCT 40.0 09/06/2013 0430   PLT 254 08/27/2013 1110   MCV 85.4 08/27/2013 1110   MCH 28.5 08/27/2013 1110   MCHC 33.4 08/27/2013 1110   RDW 14.5 08/27/2013 1110   LYMPHSABS 0.9 12/19/2012 1602   MONOABS 1.0 12/19/2012 1602   EOSABS 0.1 12/19/2012 1602   BASOSABS 0.0 12/19/2012 1602    Lab Results  Component Value Date   CREATININE 1.08 08/27/2013    Disposition: Home  Discharge instruction: He was instructed to be ambulatory but to refrain from heavy lifting, strenuous activity, or driving. He was instructed on urethral catheter care. He was instructed to call our office immediately if he developed any neurologic sx (facial droop, hemiparesis, speech changes, etc).  He was also instructed to call our office the Monday following his surgery if the left ear numbness had persisted so that an outpatient neurology visit could be established for him.    Discharge medications:     Medication List    STOP taking these medications       aspirin 81 MG tablet     finasteride 5 MG tablet  Commonly known as:  PROSCAR     tamsulosin 0.4 MG Caps capsule  Commonly known as:  FLOMAX      TAKE these medications       ciprofloxacin 500 MG tablet  Commonly known as:  CIPRO  Take 1 tablet (500 mg total) by mouth 2 (two) times daily. Start day prior to office visit for foley removal     ezetimibe 10 MG tablet  Commonly known as:  ZETIA  Take 10 mg by mouth every morning.     HYDROcodone-acetaminophen 5-325 MG per tablet  Commonly known as:  NORCO  Take 1-2 tablets by mouth every 6 (six) hours as needed.     lisinopril-hydrochlorothiazide 20-12.5 MG per tablet  Commonly known as:    PRINZIDE,ZESTORETIC  Take 1 tablet by mouth every morning.     sertraline 100 MG tablet  Commonly known as:  ZOLOFT  Take 100 mg by mouth every morning.     zolpidem 5 MG tablet  Commonly known as:  AMBIEN  Take 5 mg by mouth at bedtime as needed for sleep.        Followup: He will followup in 1 week for catheter removal and to discuss his surgical pathology results.   

## 2013-09-23 ENCOUNTER — Telehealth: Payer: Self-pay | Admitting: Sports Medicine

## 2013-09-23 ENCOUNTER — Other Ambulatory Visit: Payer: Self-pay | Admitting: *Deleted

## 2013-09-23 MED ORDER — EZETIMIBE 10 MG PO TABS: 10.0000 mg | ORAL_TABLET | Freq: Every morning | ORAL | Status: DC

## 2013-09-23 MED ORDER — ZOLPIDEM TARTRATE 5 MG PO TABS: 5.0000 mg | ORAL_TABLET | Freq: Every evening | ORAL | Status: DC | PRN

## 2013-09-23 NOTE — Telephone Encounter (Signed)
Paper refill request for Ambien. Completed per orders.  Please call and schedule follow up appointment in next 1-2 months.  Gerda Diss, DO [facility] Resident - PGY-3 09/23/2013 11:06 AM

## 2013-10-07 ENCOUNTER — Ambulatory Visit (INDEPENDENT_AMBULATORY_CARE_PROVIDER_SITE_OTHER): Payer: Medicare HMO | Admitting: Sports Medicine

## 2013-10-07 ENCOUNTER — Encounter: Payer: Self-pay | Admitting: Sports Medicine

## 2013-10-07 VITALS — BP 127/61 | HR 79 | Temp 98.0°F | Ht 71.0 in | Wt 325.4 lb

## 2013-10-07 DIAGNOSIS — D049 Carcinoma in situ of skin, unspecified: Secondary | ICD-10-CM

## 2013-10-07 DIAGNOSIS — Z0000 Encounter for general adult medical examination without abnormal findings: Secondary | ICD-10-CM | POA: Insufficient documentation

## 2013-10-07 DIAGNOSIS — Z299 Encounter for prophylactic measures, unspecified: Secondary | ICD-10-CM

## 2013-10-07 DIAGNOSIS — R209 Unspecified disturbances of skin sensation: Secondary | ICD-10-CM

## 2013-10-07 DIAGNOSIS — G4700 Insomnia, unspecified: Secondary | ICD-10-CM

## 2013-10-07 DIAGNOSIS — L989 Disorder of the skin and subcutaneous tissue, unspecified: Secondary | ICD-10-CM

## 2013-10-07 DIAGNOSIS — C61 Malignant neoplasm of prostate: Secondary | ICD-10-CM

## 2013-10-07 DIAGNOSIS — R200 Anesthesia of skin: Secondary | ICD-10-CM

## 2013-10-07 DIAGNOSIS — E7800 Pure hypercholesterolemia, unspecified: Secondary | ICD-10-CM

## 2013-10-07 DIAGNOSIS — I10 Essential (primary) hypertension: Secondary | ICD-10-CM

## 2013-10-07 MED ORDER — SERTRALINE HCL 100 MG PO TABS: 100.0000 mg | ORAL_TABLET | Freq: Every morning | ORAL | Status: DC

## 2013-10-07 MED ORDER — ZOLPIDEM TARTRATE 5 MG PO TABS: 5.0000 mg | ORAL_TABLET | Freq: Every evening | ORAL | Status: DC | PRN

## 2013-10-07 MED ORDER — LISINOPRIL-HYDROCHLOROTHIAZIDE 20-12.5 MG PO TABS: 1.0000 | ORAL_TABLET | Freq: Every day | ORAL | Status: DC

## 2013-10-07 MED ORDER — EZETIMIBE 10 MG PO TABS: 10.0000 mg | ORAL_TABLET | Freq: Every morning | ORAL | Status: DC

## 2013-10-07 NOTE — Progress Notes (Addendum)
[name] [name] - 68 y.o. male MRN [id number]  Date of birth: 02-Apr-1946  SUBJECTIVE:  Pt is here today with a chief complaint of: left face numbness, Medication Refill and Derm referral   He has had a lot going on since our last visit.  Recently underwent prostatectomy for Prostate CA and Right Arthoscopic Menisectomy.  Doing better from these issues.  Reports overall trying to better with his diet and exercise.  Medication compliance is good.  Pt denies chest pain, dyspnea at rest or exertion, PND, lower extremity edema.  Patient denies any facial asymmetry, unilateral weakness, or dysarthria. For further subjective including (HPI, Interval History & ROS) please see problem based charting  HISTORY: No results found for this basename: HGBA1C, TRIG, CHOL, HDL, LDLCALC, LDLDIRECT, TSH, T3FREE, FREET4, T3TOTAL, T4TOTAL, THYROIDAB,  in the last 8760 hours Wt Readings from Last 3 Encounters:  10/07/13 325 lb 6.4 oz (147.6 kg)  09/05/13 327 lb 5.4 oz (148.48 kg)  09/05/13 327 lb 5.4 oz (148.48 kg)   BP Readings from Last 3 Encounters:  10/07/13 127/61  09/06/13 176/88  09/06/13 176/88    History  Smoking status  . Never Smoker   Smokeless tobacco  . Never Used   No health maintenance topics applied.  }Otherwise past Medical, Surgical, Social, and Family History Reviewed per EMR Medications and Allergies reviewed and updated per below.  VITALS: BP 127/61  Pulse 79  Temp(Src) 98 F (36.7 C) (Oral)  Ht 5\' 11"  (1.803 m)  Wt 325 lb 6.4 oz (147.6 kg)  BMI 45.40 kg/m2  PHYSICAL EXAM: GENERAL:  adult obese Caucasian  male. In no discomfort; no respiratory distress  PSYCH: alert and appropriate, good insight   HNEENT:  wide-based broad neck, no JVD   CARDIO: RRR, S1/S2 heard, no murmur  LUNGS: CTA B, no wheezes, no crackles  ABDOMEN:  obese  EXTREM:  Warm, well perfused.  Moves all 4 extremities spontaneously; no lateralization.  Trace pretibial edema.  NEURO:  no facial  asymmetry, regular II - XI intact.  No pronator drift.  Sensation diminished over 4 head and left ear. The bilateral upper extremity myotomes are 5+/5.  Sensation is grossly intact to light sensation in bilateral upper extremity dermatomes.  SKIN:     MEDICATIONS, LABS & OTHER ORDERS: Previous Medications   FINASTERIDE (PROSCAR) 5 MG TABLET       HYDROCODONE-ACETAMINOPHEN (NORCO) 5-325 MG PER TABLET    Take 1-2 tablets by mouth every 6 (six) hours as needed.   Modified Medications   Modified Medication Previous Medication   EZETIMIBE (ZETIA) 10 MG TABLET ezetimibe (ZETIA) 10 MG tablet      Take 1 tablet (10 mg total) by mouth every morning.    Take 1 tablet (10 mg total) by mouth every morning.   LISINOPRIL-HYDROCHLOROTHIAZIDE (PRINZIDE,ZESTORETIC) 20-12.5 MG PER TABLET lisinopril-hydrochlorothiazide (PRINZIDE,ZESTORETIC) 20-12.5 MG per tablet      Take 1 tablet by mouth daily.    Take 1 tablet by mouth every morning.   SERTRALINE (ZOLOFT) 100 MG TABLET sertraline (ZOLOFT) 100 MG tablet      Take 1 tablet (100 mg total) by mouth every morning.    Take 100 mg by mouth every morning.   ZOLPIDEM (AMBIEN) 5 MG TABLET zolpidem (AMBIEN) 5 MG tablet      Take 1 tablet (5 mg total) by mouth at bedtime as needed for sleep.    Take 1 tablet (5 mg total) by mouth at bedtime as needed for sleep.  New Prescriptions   No medications on file   Discontinued Medications   CIPROFLOXACIN (CIPRO) 500 MG TABLET    Take 1 tablet (500 mg total) by mouth 2 (two) times daily. Start day prior to office visit for foley removal   Orders Placed This Encounter  Procedures  . Lipid panel  . CBC  . Comprehensive metabolic panel  . TSH  . Split night study   ASSESSMENT & PLAN: See problem based charting & AVS for pt instructions.

## 2013-10-07 NOTE — Patient Instructions (Signed)
It was good to see you.  We are referring to you for a dermatology appointment. I have sent in refills and printed out your Ambien. We're referring to you for a split-night sleep study to evaluate for structural sleep apnea. If you eye worsens please let me know.   No other changes to her medications. Keep working on Therapeutic Lifestyle Changes including diet & exercise

## 2013-10-11 DIAGNOSIS — R200 Anesthesia of skin: Secondary | ICD-10-CM | POA: Insufficient documentation

## 2013-10-11 NOTE — Assessment & Plan Note (Signed)
Patient to return for fasting labs

## 2013-10-11 NOTE — Assessment & Plan Note (Signed)
Problem Based Documentation:    Subjective Report:  This reports left-sided ear and forehead numbness since surgery.  Denies any significant pain or motor changes.  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 Conservative therapy 2. No other deficits noted > Consider further advanced imaging it not continuing to improve.

## 2013-10-11 NOTE — Assessment & Plan Note (Signed)
Patient resources Limited to 2 insurance restrictions. Will return for full skin evaluation in the next 1-2 months.

## 2013-10-11 NOTE — Assessment & Plan Note (Signed)
Problem Based Documentation:    Subjective Report:  Continues to have symptoms most nights; seems worse with anxiety  Ambien 1-2 times per week.  Significant snoring history  Positive stop BANG eval; no prior OSA eval     Assessment & Plan & Follow up Issues:  Chronic worsening condition 1. Schedule polysomnogram for evaluation of OSA/OHS

## 2013-10-11 NOTE — Assessment & Plan Note (Signed)
Problem Based Documentation:    Subjective Report:   See progress note     Assessment & Plan & Follow up Issues:  Chronic, well controlled condition 1. No changes to blood pressure regimen 2. Continue TLC (Therapeutic Lifestyle Changes)

## 2013-11-11 ENCOUNTER — Ambulatory Visit (HOSPITAL_BASED_OUTPATIENT_CLINIC_OR_DEPARTMENT_OTHER): Payer: Medicare HMO | Attending: Sports Medicine | Admitting: Radiology

## 2013-11-11 VITALS — Ht 73.0 in | Wt 300.0 lb

## 2013-11-11 DIAGNOSIS — G4733 Obstructive sleep apnea (adult) (pediatric): Secondary | ICD-10-CM

## 2013-11-11 DIAGNOSIS — Z9989 Dependence on other enabling machines and devices: Secondary | ICD-10-CM

## 2013-11-11 DIAGNOSIS — G4730 Sleep apnea, unspecified: Principal | ICD-10-CM

## 2013-11-11 DIAGNOSIS — G4710 Hypersomnia, unspecified: Secondary | ICD-10-CM | POA: Diagnosis not present

## 2013-11-11 DIAGNOSIS — G4700 Insomnia, unspecified: Secondary | ICD-10-CM

## 2013-11-16 DIAGNOSIS — G4710 Hypersomnia, unspecified: Secondary | ICD-10-CM

## 2013-11-16 DIAGNOSIS — G4730 Sleep apnea, unspecified: Secondary | ICD-10-CM

## 2013-11-16 NOTE — Sleep Study (Signed)
   NAME: [name] [name] DATE OF BIRTH:  Oct 12, 1945 MEDICAL RECORD NUMBER [id number]  LOCATION: [facility]  PHYSICIAN: Levora Werden D Gustavo Meditz  DATE OF STUDY: 11/11/2013  SLEEP STUDY TYPE: Nocturnal Polysomnogram               REFERRING PHYSICIAN: Gerda Diss, DO  INDICATION FOR STUDY: A hypersomnia with sleep apnea  EPWORTH SLEEPINESS SCORE:   3/24 HEIGHT: 6\' 1"  (185.4 cm)  WEIGHT: 300 lb (136.079 kg)    Body mass index is 39.59 kg/(m^2).  NECK SIZE: 18.5 in.  MEDICATIONS: Charted for review  SLEEP ARCHITECTURE: Split study protocol. During the diagnostic phase, total sleep time 125.5 minutes with sleep efficiency 56.2%. Stage I was 31.5%, stage II 67.7%, stage III 0.8%, REM absent. Sleep latency 21 minutes, awake after sleep onset 73 minutes, arousal index 51.6, bedtime medication: None  RESPIRATORY DATA: Apnea hypopnea index (AHI) 33 per hour. 69 total events scored including 22 obstructive apneas, 4 mixed apneas, 43 hypopneas. Events were seen in all positions. CPAP was titrated to 18 CWP, AHI 0 per hour. He wore a medium F&P Simplus fullface mask with heated humidifier and an EPR of 1.  OXYGEN DATA:  Snoring before CPAP was moderate to loud with oxygen desaturation to a nadir of 84% on room air. With CPAP control, snoring was prevented and mean oxygen saturation of 93.3% on room air.   CARDIAC DATA:  sinus rhythm with PVCs   MOVEMENT/PARASOMNIA:  limb jerks were noted but with little effect on sleep. Bathroom x1   IMPRESSION/ RECOMMENDATION:   1) Severe obstructive sleep apnea/hypopneas syndrome, AHI 33 per hour with events in all positions. Moderate to loud snoring with oxygen desaturation to a nadir of 84% on room air.  2) Successful CPAP titration to 18 CWP, AHI 0 per hour. He wore a medium [employer] Simplus fullface mask with heated humidifier and an EPR of 1. Snoring was prevented and mean oxygen saturation of 93.3% on room air.     signed  Baird Lyons M.D.  [place], [facility]  ELECTRONICALLY SIGNED ON:  11/16/2013, 3:42 PM [place] PH: [phone]   FX: [phone] [place]

## 2013-11-25 ENCOUNTER — Encounter (HOSPITAL_BASED_OUTPATIENT_CLINIC_OR_DEPARTMENT_OTHER): Payer: Self-pay | Admitting: Radiology

## 2013-11-25 DIAGNOSIS — G4733 Obstructive sleep apnea (adult) (pediatric): Secondary | ICD-10-CM

## 2013-11-25 DIAGNOSIS — Z9989 Dependence on other enabling machines and devices: Secondary | ICD-10-CM

## 2013-11-25 HISTORY — DX: Obstructive sleep apnea (adult) (pediatric): G47.33

## 2013-11-25 NOTE — Addendum Note (Signed)
Addended by: Teresa Coombs D on: 11/25/2013 10:54 AM   Modules accepted: Orders

## 2013-11-25 NOTE — Assessment & Plan Note (Signed)
Referral for home health RESP therapy order placed with findings

## 2013-12-04 ENCOUNTER — Telehealth: Payer: Self-pay | Admitting: *Deleted

## 2013-12-04 NOTE — Telephone Encounter (Signed)
I have called and spoken to pts primary pharmacy in [place].  The recommend trying to contact [facility] @ [phone].  They supposedly service [employer] in the greater [place] area

## 2013-12-04 NOTE — Telephone Encounter (Signed)
[facility] does not accept [employer] insurance and the referral for the CPap was declined.  Per Edmonia Lynch try [facility].  Derl Barrow, RN

## 2013-12-12 NOTE — Telephone Encounter (Signed)
Have we made any progress on getting him a CPAP?

## 2014-01-13 ENCOUNTER — Ambulatory Visit: Payer: Medicare HMO | Admitting: Sports Medicine

## 2014-01-28 ENCOUNTER — Ambulatory Visit (INDEPENDENT_AMBULATORY_CARE_PROVIDER_SITE_OTHER): Payer: Medicare HMO | Admitting: Sports Medicine

## 2014-01-28 ENCOUNTER — Encounter: Payer: Self-pay | Admitting: Sports Medicine

## 2014-01-28 VITALS — BP 132/88 | HR 82 | Temp 98.2°F | Ht 73.0 in | Wt 338.4 lb

## 2014-01-28 DIAGNOSIS — L989 Disorder of the skin and subcutaneous tissue, unspecified: Secondary | ICD-10-CM

## 2014-01-28 DIAGNOSIS — G4733 Obstructive sleep apnea (adult) (pediatric): Secondary | ICD-10-CM

## 2014-01-28 DIAGNOSIS — Z9989 Dependence on other enabling machines and devices: Principal | ICD-10-CM

## 2014-01-28 NOTE — Assessment & Plan Note (Signed)
Chronic untreated condition  - needs CPAP set up.  There have been significant issues establishing this given his insurance.  Will have social work coordinate with obtaining. 1. CPAP - settings18CWP. [place] with heated humidifier and an EPR of 1.
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
## 2014-01-28 NOTE — Assessment & Plan Note (Addendum)
Chronic condition  - multiple low lumbar lesions that are causing bleeding irritation and increasing in size.  Patient with a history of squamous cell carcinoma in situ. Clinical appearance of hemangioma although one is slightly irregular. 1. Shave biopsy is performed today x3.  2 specimen sent for pathology.  > Recommend trying to follow up with dermatology although no significantly concerning lesions noted today.

## 2014-01-28 NOTE — Patient Instructions (Signed)
If you do not hear from [name] in the next 2 weeks about your CPAP please give [name] a call back. If there is any abnormal pathology we'll send you a letter. Please followup with your new PCP in 6 months to see how things are going.

## 2014-01-29 ENCOUNTER — Other Ambulatory Visit: Payer: Self-pay | Admitting: Family Medicine

## 2014-01-29 DIAGNOSIS — G4733 Obstructive sleep apnea (adult) (pediatric): Secondary | ICD-10-CM

## 2014-01-29 NOTE — Progress Notes (Signed)
CSW has requested that new PCP place a new referral for a CIPAP. CSW to send to [facility] once placed.  Hunt Oris, MSW, [place]

## 2014-01-29 NOTE — Progress Notes (Signed)
Order and clinicals sent to [facility].  Hunt Oris, MSW, [place]

## 2014-01-30 ENCOUNTER — Telehealth: Payer: Self-pay | Admitting: *Deleted

## 2014-01-30 NOTE — Addendum Note (Signed)
Addended by: Johny Shears on: 01/30/2014 04:37 PM   Modules accepted: Orders

## 2014-01-30 NOTE — Telephone Encounter (Signed)
Ordered again and faxed

## 2014-01-30 NOTE — Telephone Encounter (Signed)
Christy from [facility] left voice message stating they received two Biopsys but only one was listed on the requisition.  If both are needed she needs an updated requisition. Please give her a call at [phone] ext [phone].  Derl Barrow, RN

## 2014-02-10 ENCOUNTER — Telehealth: Payer: Self-pay | Admitting: Clinical

## 2014-02-10 ENCOUNTER — Other Ambulatory Visit: Payer: Self-pay | Admitting: Family Medicine

## 2014-02-10 DIAGNOSIS — G4733 Obstructive sleep apnea (adult) (pediatric): Secondary | ICD-10-CM

## 2014-02-10 DIAGNOSIS — Z9989 Dependence on other enabling machines and devices: Principal | ICD-10-CM

## 2014-02-10 NOTE — Telephone Encounter (Signed)
CSW contacted [facility] to inquire about pt's CIPAP. CSW informed that the order for the CIPAP did not have any settings. Agency needing an order faxed with the settings. Request sent to PCP. CSW to fax order [phone] when received.  Hunt Oris, MSW, [place]

## 2014-02-10 NOTE — Progress Notes (Signed)
I will send order and hopefully this is what they need.  Hunt Oris, MSW, [place]

## 2014-02-11 ENCOUNTER — Encounter: Payer: Self-pay | Admitting: Clinical

## 2014-02-11 NOTE — Progress Notes (Signed)
Referral for CIPAP with settings was faxed to [facility] 02/10/14.  Hunt Oris, MSW, [place]

## 2014-02-24 ENCOUNTER — Telehealth: Payer: Self-pay | Admitting: Family Medicine

## 2014-02-24 DIAGNOSIS — C61 Malignant neoplasm of prostate: Secondary | ICD-10-CM

## 2014-02-24 NOTE — Telephone Encounter (Signed)
Please advise.Thank you.Sota Hetz S  

## 2014-02-24 NOTE — Telephone Encounter (Signed)
Patient needs a new referral to [facility]. Old one has expires. Patient's appt is next week.

## 2014-02-25 NOTE — Telephone Encounter (Signed)
Patient informed.voiced great appreciation.[name] [name], [name] [name]

## 2014-03-10 ENCOUNTER — Telehealth: Payer: Self-pay | Admitting: *Deleted

## 2014-03-10 NOTE — Telephone Encounter (Signed)
Faxed referral with a copy of authorization from [facility] to [facility]. Jazmin Hartsell,CMA

## 2014-03-10 NOTE — Telephone Encounter (Signed)
Holly from [facility] called stating they needed pt's referral faxed.  He has [employer].  Pt has an appt 03/14/14.  Please fax to [phone].  Please give her a call at [phone] ext [phone]. Derl Barrow, RN

## 2014-04-14 ENCOUNTER — Ambulatory Visit (INDEPENDENT_AMBULATORY_CARE_PROVIDER_SITE_OTHER): Payer: Commercial Managed Care - HMO | Admitting: Family Medicine

## 2014-04-14 ENCOUNTER — Encounter: Payer: Self-pay | Admitting: Family Medicine

## 2014-04-14 VITALS — BP 142/91 | HR 76 | Temp 98.1°F | Ht 73.0 in | Wt 309.0 lb

## 2014-04-14 DIAGNOSIS — R1032 Left lower quadrant pain: Secondary | ICD-10-CM

## 2014-04-14 MED ORDER — TRAMADOL HCL 50 MG PO TABS: 50.0000 mg | ORAL_TABLET | Freq: Three times a day (TID) | ORAL | Status: DC | PRN

## 2014-04-14 NOTE — Patient Instructions (Addendum)
Thank you for coming to see me today. It was a pleasure. Today we talked about:   Left quadrant stomach pain: I will get a CT scan of your stomach to make sure nothing worrisome is going on. You will get a call with the results. I have to get some blood beforehand to check your kidney function. I am prescribing Tramadol for pain.  Please make an appointment to see Dr. Bonner Puna in 1-2 weeks for follow-up of this issue depending on what the CT scan shows.  If you have any questions or concerns, please do not hesitate to call the office at [phone].  Sincerely,  Cordelia Poche, MD

## 2014-04-14 NOTE — Progress Notes (Signed)
    Subjective   ADAEL CULBREATH is a 68 y.o. male that presents for a same day visit  1. Abdominal pain: LLQ, achy pain that does not radiate. Better at night when laying. Worse during daytime when moving around. Started about 7 months ago. History of prostate surgery in 09/2013 with subsequent ventral hernia that developed. Has tried Advil 400mg  BID which did not help. Bowel movements have been regular. No hematochezia. No fevers, nausea or vomiting. No dysuria or hematuria.  History  Substance Use Topics  . Smoking status: Never Smoker   . Smokeless tobacco: Never Used  . Alcohol Use: No    ROS Per HPI  Objective   BP 142/91  Pulse 76  Temp(Src) 98.1 F (36.7 C) (Oral)  Ht 6\' 1"  (1.854 m)  Wt 309 lb (140.161 kg)  BMI 40.78 kg/m2  General: Well appearing Gastrointestinal: Bowel sounds present. Obese. Tenderness in LLQ. Non-distended.  Assessment and Plan   Please refer to problem based charting of assessment and plan

## 2014-04-15 LAB — BASIC METABOLIC PANEL
BUN: 13 mg/dL (ref 6–23)
CO2: 27 meq/L (ref 19–32)
CREATININE: 1.11 mg/dL (ref 0.50–1.35)
Calcium: 9.6 mg/dL (ref 8.4–10.5)
Chloride: 101 mEq/L (ref 96–112)
Glucose, Bld: 92 mg/dL (ref 70–99)
Potassium: 4.3 mEq/L (ref 3.5–5.3)
SODIUM: 136 meq/L (ref 135–145)

## 2014-04-15 NOTE — Assessment & Plan Note (Addendum)
No fever, nausea or vomiting. No constipation, diarrhea or hematochezia. No pain with defecation. With chronic nature of pain, would consider constipation from recent abdominal surgery vs possible metastasis from prostate cancer vs diverticulitis. Will get CT scan of abdomen/pelvis with contrast. Bmet to check creatinine. Tramadol for pain.

## 2014-04-21 ENCOUNTER — Ambulatory Visit
Admission: RE | Admit: 2014-04-21 | Discharge: 2014-04-21 | Disposition: A | Discharge status: Home / Self Care | Payer: Commercial Managed Care - HMO | Source: Ambulatory Visit | Attending: Family Medicine | Admitting: Family Medicine

## 2014-04-21 DIAGNOSIS — R1032 Left lower quadrant pain: Secondary | ICD-10-CM

## 2014-04-21 MED ORDER — IOHEXOL 300 MG/ML  SOLN: 100.0000 mL | Freq: Once | INTRAMUSCULAR | Status: DC | PRN

## 2014-04-21 MED ORDER — IOHEXOL 300 MG/ML  SOLN
125.0000 mL | Freq: Once | INTRAMUSCULAR | Status: AC | PRN
  Administered 2014-04-21: 125 mL via INTRAVENOUS

## 2014-04-23 ENCOUNTER — Telehealth: Payer: Self-pay | Admitting: Family Medicine

## 2014-04-23 NOTE — Telephone Encounter (Signed)
Called patient to discuss results of CT scan. Discussed that results did not explain a reason for patient's abdominal pain. Patient's pain has remained unchanged. Recommended follow-up with PCP to continue to address this pain as I saw him as a same-day. Patient understood and agreed with plan.

## 2014-07-15 ENCOUNTER — Telehealth: Payer: Self-pay | Admitting: *Deleted

## 2014-07-15 ENCOUNTER — Encounter: Payer: Self-pay | Admitting: Family Medicine

## 2014-07-15 ENCOUNTER — Ambulatory Visit (INDEPENDENT_AMBULATORY_CARE_PROVIDER_SITE_OTHER): Payer: Commercial Managed Care - HMO | Admitting: *Deleted

## 2014-07-15 ENCOUNTER — Ambulatory Visit (INDEPENDENT_AMBULATORY_CARE_PROVIDER_SITE_OTHER): Payer: Commercial Managed Care - HMO | Admitting: Family Medicine

## 2014-07-15 VITALS — BP 149/79 | HR 74 | Temp 98.4°F | Ht 73.0 in | Wt 316.2 lb

## 2014-07-15 DIAGNOSIS — M545 Low back pain, unspecified: Secondary | ICD-10-CM

## 2014-07-15 DIAGNOSIS — R945 Abnormal results of liver function studies: Secondary | ICD-10-CM

## 2014-07-15 DIAGNOSIS — Z23 Encounter for immunization: Secondary | ICD-10-CM

## 2014-07-15 DIAGNOSIS — M4722 Other spondylosis with radiculopathy, cervical region: Secondary | ICD-10-CM | POA: Insufficient documentation

## 2014-07-15 DIAGNOSIS — E669 Obesity, unspecified: Secondary | ICD-10-CM

## 2014-07-15 DIAGNOSIS — M546 Pain in thoracic spine: Secondary | ICD-10-CM

## 2014-07-15 DIAGNOSIS — I10 Essential (primary) hypertension: Secondary | ICD-10-CM

## 2014-07-15 DIAGNOSIS — R7989 Other specified abnormal findings of blood chemistry: Secondary | ICD-10-CM

## 2014-07-15 LAB — COMPREHENSIVE METABOLIC PANEL
ALT: 13 U/L (ref 0–53)
AST: 15 U/L (ref 0–37)
Albumin: 4.1 g/dL (ref 3.5–5.2)
Alkaline Phosphatase: 59 U/L (ref 39–117)
BILIRUBIN TOTAL: 0.5 mg/dL (ref 0.2–1.2)
BUN: 21 mg/dL (ref 6–23)
CHLORIDE: 103 meq/L (ref 96–112)
CO2: 23 meq/L (ref 19–32)
CREATININE: 1.01 mg/dL (ref 0.50–1.35)
Calcium: 9 mg/dL (ref 8.4–10.5)
Glucose, Bld: 87 mg/dL (ref 70–99)
Potassium: 4.1 mEq/L (ref 3.5–5.3)
SODIUM: 138 meq/L (ref 135–145)
Total Protein: 6.6 g/dL (ref 6.0–8.3)

## 2014-07-15 LAB — CBC
HCT: 45 % (ref 39.0–52.0)
Hemoglobin: 15.8 g/dL (ref 13.0–17.0)
MCH: 28.7 pg (ref 26.0–34.0)
MCHC: 35.1 g/dL (ref 30.0–36.0)
MCV: 81.8 fL (ref 78.0–100.0)
MPV: 9.5 fL (ref 9.4–12.4)
Platelets: 269 10*3/uL (ref 150–400)
RBC: 5.5 MIL/uL (ref 4.22–5.81)
RDW: 14.7 % (ref 11.5–15.5)
WBC: 7.4 10*3/uL (ref 4.0–10.5)

## 2014-07-15 LAB — LIPID PANEL
CHOLESTEROL: 198 mg/dL (ref 0–200)
HDL: 31 mg/dL — ABNORMAL LOW (ref >39)
LDL Cholesterol: 98 mg/dL (ref 0–99)
Total CHOL/HDL Ratio: 6.4 Ratio
Triglycerides: 344 mg/dL — ABNORMAL HIGH (ref <150)
VLDL: 69 mg/dL — ABNORMAL HIGH (ref 0–40)

## 2014-07-15 LAB — POCT GLYCOSYLATED HEMOGLOBIN (HGB A1C): Hemoglobin A1C: 5.6

## 2014-07-15 NOTE — Assessment & Plan Note (Signed)
Chronic, worsening. Given duration of symptoms will get dedicated scan in open MRI given pt's size. NSAIDs prn pain, will likely get involved in PT possibly surgical referral depending on scan results.

## 2014-07-15 NOTE — Telephone Encounter (Signed)
Called pt to inform him of his appointment on December 30th at [facility] at 8:55 am. Location is at [address] Phone number is [phone] if pt needs to contact them.  Authorization number is [id number]. Katharina Caper, April D
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
## 2014-07-15 NOTE — Assessment & Plan Note (Signed)
Began motivational interviewing at this visit; pt's stated goal is to weigh 225 lbs, recognizing it underlies many of his health problems. Will schedule CPE in January and discuss SMART goals in more detail.

## 2014-07-15 NOTE — Patient Instructions (Signed)
Back pain:  - diagnosis: MRI scan - treatment: heat, mobic , staying active  Follow up after the holidays to cherck BP, have a physical and discuss weight loss.

## 2014-07-15 NOTE — Assessment & Plan Note (Signed)
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

## 2014-07-18 ENCOUNTER — Telehealth: Payer: Self-pay | Admitting: Family Medicine

## 2014-07-18 NOTE — Telephone Encounter (Signed)
Pt calling about medication that was supposed to be prescribed at his last appt for his back. The pharmacy never received it. Pt thinks it was an anti inflammatory. Pt  Uses [facility].

## 2014-07-22 MED ORDER — TRAMADOL HCL 50 MG PO TABS: 50.0000 mg | ORAL_TABLET | Freq: Four times a day (QID) | ORAL | Status: DC | PRN

## 2014-07-22 NOTE — Telephone Encounter (Signed)
Pt called and was still waiting for the medication that Dr. Bonner Puna said he was going to call in. Can we please call patient. He has been waiting since 07/15/14. jw

## 2014-07-22 NOTE — Telephone Encounter (Signed)
Apparently Mr. Loren has a history of internal bleeding ascribed to NSAID use and so mobic is not an appropriate choice for his pain relief. I have printed a prescription for tramadol and left it at the front of the office for pick up whenever he is ready. Please let him know.

## 2014-07-23 NOTE — Telephone Encounter (Signed)
Called pt and informed him that his prescription would be in the front office for him to pick up.  He stated he would be by today.

## 2014-07-30 ENCOUNTER — Ambulatory Visit
Admission: RE | Admit: 2014-07-30 | Discharge: 2014-07-30 | Disposition: A | Discharge status: Home / Self Care | Payer: Commercial Managed Care - HMO | Source: Ambulatory Visit | Attending: Family Medicine | Admitting: Family Medicine

## 2014-08-06 ENCOUNTER — Encounter: Payer: Self-pay | Admitting: Family Medicine

## 2014-08-15 ENCOUNTER — Encounter: Payer: Self-pay | Admitting: Family Medicine

## 2014-08-15 ENCOUNTER — Ambulatory Visit (INDEPENDENT_AMBULATORY_CARE_PROVIDER_SITE_OTHER): Payer: Commercial Managed Care - HMO | Admitting: Family Medicine

## 2014-08-15 VITALS — BP 122/62 | HR 84 | Ht 73.0 in | Wt 319.9 lb

## 2014-08-15 DIAGNOSIS — E669 Obesity, unspecified: Secondary | ICD-10-CM | POA: Diagnosis not present

## 2014-08-15 DIAGNOSIS — E7800 Pure hypercholesterolemia, unspecified: Secondary | ICD-10-CM

## 2014-08-15 DIAGNOSIS — B070 Plantar wart: Secondary | ICD-10-CM

## 2014-08-15 DIAGNOSIS — I10 Essential (primary) hypertension: Secondary | ICD-10-CM

## 2014-08-15 MED ORDER — ROSUVASTATIN CALCIUM 5 MG PO TABS: 5.0000 mg | ORAL_TABLET | Freq: Every day | ORAL | Status: DC

## 2014-08-15 NOTE — Assessment & Plan Note (Signed)
Switch zetia to crestor 5mg  and repeat LFTs and LDL in 4-6 weeks.

## 2014-08-15 NOTE — Patient Instructions (Signed)
Cholesterol: Stop taking zetia and start taking crestor 5mg  once a day in the early evening.  Back: Weight loss is definitive treatment, so continue your good plan to diet and exercise.  - Let me know if you would like a referral to physical therapy.  Foot: Podiatry referral.  Ear ringing: You are free to try lipoflavanol. If you develop decreased hearing or vertigo, please let me know.   Please call us with information on the types and manufacturers of the herbs you will be taking.

## 2014-08-15 NOTE — Progress Notes (Signed)
Subjective: [name] [name] is a 69 y.o. male here for complete physical exam.  Complaints include chronic back pain as discussed at previous visit, ringing in his ears for several years and right foot pain.   Tinnitus: Is interested in homeopathic remedies. No vertigo or dizziness or lightheadedness, no changes or loss in hearing. Was evaluated at ENT for this issue with negative work up. Is taking only zetia, lisinopril-HCTZ and zoloft. Does not take any antibiotics or aspirin daily.   Obesity: Has been considering plans for weight loss due to both exercise and diet modification. Goal: To be able to walk 6.5 miles from house to restaurant where he has daily coffee by Easter. He walks consistently now and has been increasing his exercise capacity. He is also beginning a diet sponsored by a chiropractor, Dr. Jimmye Norman. It is devoid of carbohydrates and focuses on lean protein and vegetables. It prescribes that he eat no breakfast and eats nothing following dinner. Has cut down from 12 to 2 cups per day.   Right foot pain: increasing to mild-moderate only when walking on the ball of his foot for the past 2 weeks. He believes it may be related to a shard of glass he walked on 15 years ago. No heel pain or pain upon waking. No joint stiffness.   Immunizations: UTD Last colonoscopy was 2015: Has h/o colonic polyps. He believes follow up is recommended for 2018. U/S: No AAA screening indicated (has smoked < 100 cigarettes in his lifetime) EGD: 03/01/2012 Normal. Recommended gastroscintigraphy. Nausea has subsided.   - Review of Systems: Per HPI. All other systems reviewed and are negative. - Meds: Lisinopril-HCTZ 20-12.5mg ; Zoloft 100mg ; Crestor 5mg   Objective: BP 122/62 mmHg  Pulse 84  Ht 6\' 1"  (1.854 m)  Wt 319 lb 14.4 oz (145.106 kg)  BMI 42.21 kg/m2 Gen: Well-appearing, obese 68 y.o.male in NAD HEENT: Sclerae/conjunctivae clear, TMs and ear canals normal bilaterally, PERRL, MMM, posterior  oropharynx clear, good dentition Neck: neck supple, no masses appreciated; thyroid not enlarged  Pulm: Non-labored; CTAB, no wheezes  CV: Regular rate, no murmur appreciated; distal pulses intact/symmetric; no LE edema GI: NABS; soft, non-tender, non-distended, no HSM, no hernia Lymph: No cervical, supraclavicular, or axillary lymphadenopathy Skin: Extensive callus on right forefoot medially with hyperkeratosis and fibrotic thickening in punctate area consistent with either plantar wart or encapsulated foreign body.  MSK: Normal gait and station; no digital clubbing/cyanosis, no frank joint deformity/effusion, full active ROM, no point muscle/bony tenderness in spine Neuro: CN II-XII without deficits, sensation intact to light touch, patellar DTRs 2+ bilaterally Psych: A&Ox3, mood "good" with full affect, intact recent and remote memory, good judgment, good insight, speech clear and coherent  Assessment/Plan: [name] [name] is a 69 y.o. male here for CPE  Problem List Items Addressed This Visit      Cardiovascular and Mediastinum   HYPERTENSION, BENIGN ESSENTIAL - Primary    At goal. Recent labwork is stable. Weight loss, DASH diet extensivelydiscussed today.       Relevant Medications   rosuvastatin (CRESTOR) tablet     Musculoskeletal and Integument   Plantar wart of right foot   Relevant Orders   Ambulatory referral to Podiatry     Other   HYPERCHOLESTEROLEMIA    Switch zetia to crestor 5mg  and repeat LFTs and LDL in 4-6 weeks.       Relevant Medications   rosuvastatin (CRESTOR) tablet   Obesity

## 2014-08-15 NOTE — Assessment & Plan Note (Signed)
At goal. Recent labwork is stable. Weight loss, DASH diet extensivelydiscussed today.

## 2014-08-20 ENCOUNTER — Telehealth: Payer: Self-pay | Admitting: Family Medicine

## 2014-08-20 NOTE — Telephone Encounter (Signed)
[name] [name] called and needs the [employer] faxed over the authorization to them at [phone] attention [name] [name]. jw

## 2014-08-20 NOTE — Telephone Encounter (Signed)
authorization received and documented in referral.  [facility] informed. [name] [name], [name] [name]

## 2014-08-24 DIAGNOSIS — G4733 Obstructive sleep apnea (adult) (pediatric): Secondary | ICD-10-CM | POA: Diagnosis not present

## 2014-08-28 ENCOUNTER — Encounter: Payer: Self-pay | Admitting: Podiatry

## 2014-08-28 ENCOUNTER — Ambulatory Visit (INDEPENDENT_AMBULATORY_CARE_PROVIDER_SITE_OTHER): Payer: Commercial Managed Care - HMO

## 2014-08-28 ENCOUNTER — Ambulatory Visit (INDEPENDENT_AMBULATORY_CARE_PROVIDER_SITE_OTHER): Payer: Commercial Managed Care - HMO | Admitting: Podiatry

## 2014-08-28 VITALS — BP 157/81 | HR 65 | Resp 16 | Ht 73.0 in | Wt 300.0 lb

## 2014-08-28 DIAGNOSIS — M216X9 Other acquired deformities of unspecified foot: Secondary | ICD-10-CM

## 2014-08-28 DIAGNOSIS — L84 Corns and callosities: Secondary | ICD-10-CM

## 2014-08-28 DIAGNOSIS — B351 Tinea unguium: Secondary | ICD-10-CM

## 2014-08-28 DIAGNOSIS — M779 Enthesopathy, unspecified: Secondary | ICD-10-CM

## 2014-08-28 DIAGNOSIS — Q667 Congenital pes cavus: Secondary | ICD-10-CM

## 2014-08-28 NOTE — Progress Notes (Signed)
   Subjective:    Patient ID: [name] [name], male    DOB: 10-05-1945, 69 y.o.   MRN: [id number]  HPI Comments: "I have a place on the bottom"  Patient c/o tender sub 1st MPJ right for couple years. There is a callused area. He remembers stepping on a sliver of glass few years ago. The callus has built up since. He files with a pumice stone-helps temporarily.   Foot Pain      Review of Systems  Musculoskeletal: Positive for back pain.  All other systems reviewed and are negative.      Objective:   Physical Exam        Assessment & Plan:

## 2014-08-28 NOTE — Progress Notes (Signed)
Subjective:     Patient ID: [name] [name], male   DOB: 12-23-1945, 69 y.o.   MRN: [id number]  HPI patient presents with pain underneath his first metatarsal right foot of several years duration. States that he is quite obese and needs to exercise but when he tries to it becomes painful underneath   Review of Systems  All other systems reviewed and are negative.      Objective:   Physical Exam  Constitutional: He is oriented to person, place, and time.  Cardiovascular: Intact distal pulses.   Musculoskeletal: Normal range of motion.  Neurological: He is oriented to person, place, and time.  Skin: Skin is warm.  Nursing note and vitals reviewed.  neurovascular status intact with muscle strength adequate range of motion within normal limits. Patient's noted to have moderate plantar flexion of the first metatarsal right and keratotic lesion plantar that's noted with pain when palpated and no indications of lucent-type lesion     Assessment:     Probable plantarflexed metatarsal with keratotic-type lesion secondary to increase weightbearing against the floor surface    Plan:     H&P and x-ray reviewed. Today I debrided the lesion and applied padding and explained possibility for orthotics or other treatment depending on response. Reappoint to recheck

## 2014-09-04 DIAGNOSIS — H35712 Central serous chorioretinopathy, left eye: Secondary | ICD-10-CM | POA: Diagnosis not present

## 2014-09-10 DIAGNOSIS — C61 Malignant neoplasm of prostate: Secondary | ICD-10-CM | POA: Diagnosis not present

## 2014-09-11 ENCOUNTER — Telehealth: Payer: Self-pay | Admitting: Family Medicine

## 2014-09-11 ENCOUNTER — Encounter: Payer: Self-pay | Admitting: Family Medicine

## 2014-09-11 DIAGNOSIS — C61 Malignant neoplasm of prostate: Secondary | ICD-10-CM

## 2014-09-11 NOTE — Progress Notes (Signed)
Patient left message on medical records lines requesting a referral to [facility] [address].  Dr Bonner Puna it patient's pcp.  I was added in error.  Will forward to him [name] [name],[name] [name]

## 2014-09-11 NOTE — Telephone Encounter (Signed)
There is already a message regarding this. Jazmin Hartsell,CMA

## 2014-09-11 NOTE — Telephone Encounter (Signed)
Requesting referral to [facility] with Dr. Alinda Money

## 2014-09-17 DIAGNOSIS — N393 Stress incontinence (female) (male): Secondary | ICD-10-CM | POA: Diagnosis not present

## 2014-09-17 DIAGNOSIS — N529 Male erectile dysfunction, unspecified: Secondary | ICD-10-CM | POA: Diagnosis not present

## 2014-09-17 DIAGNOSIS — Z8546 Personal history of malignant neoplasm of prostate: Secondary | ICD-10-CM | POA: Diagnosis not present

## 2014-09-18 NOTE — Progress Notes (Signed)
I have placed a referral to urology for follow up care for history of prostate CA. I have not personally evaluated him for this problem.

## 2014-09-19 DIAGNOSIS — M9905 Segmental and somatic dysfunction of pelvic region: Secondary | ICD-10-CM | POA: Diagnosis not present

## 2014-09-19 DIAGNOSIS — M9903 Segmental and somatic dysfunction of lumbar region: Secondary | ICD-10-CM | POA: Diagnosis not present

## 2014-09-19 DIAGNOSIS — M5116 Intervertebral disc disorders with radiculopathy, lumbar region: Secondary | ICD-10-CM | POA: Diagnosis not present

## 2014-09-19 DIAGNOSIS — M9902 Segmental and somatic dysfunction of thoracic region: Secondary | ICD-10-CM | POA: Diagnosis not present

## 2014-09-22 DIAGNOSIS — M9903 Segmental and somatic dysfunction of lumbar region: Secondary | ICD-10-CM | POA: Diagnosis not present

## 2014-09-22 DIAGNOSIS — M5116 Intervertebral disc disorders with radiculopathy, lumbar region: Secondary | ICD-10-CM | POA: Diagnosis not present

## 2014-09-22 DIAGNOSIS — M9905 Segmental and somatic dysfunction of pelvic region: Secondary | ICD-10-CM | POA: Diagnosis not present

## 2014-09-22 DIAGNOSIS — M545 Low back pain: Secondary | ICD-10-CM | POA: Diagnosis not present

## 2014-09-24 DIAGNOSIS — G4733 Obstructive sleep apnea (adult) (pediatric): Secondary | ICD-10-CM | POA: Diagnosis not present

## 2014-09-26 DIAGNOSIS — M9905 Segmental and somatic dysfunction of pelvic region: Secondary | ICD-10-CM | POA: Diagnosis not present

## 2014-09-26 DIAGNOSIS — M5116 Intervertebral disc disorders with radiculopathy, lumbar region: Secondary | ICD-10-CM | POA: Diagnosis not present

## 2014-09-26 DIAGNOSIS — M545 Low back pain: Secondary | ICD-10-CM | POA: Diagnosis not present

## 2014-09-26 DIAGNOSIS — M9903 Segmental and somatic dysfunction of lumbar region: Secondary | ICD-10-CM | POA: Diagnosis not present

## 2014-10-02 ENCOUNTER — Other Ambulatory Visit: Payer: Self-pay | Admitting: Sports Medicine

## 2014-10-23 DIAGNOSIS — G4733 Obstructive sleep apnea (adult) (pediatric): Secondary | ICD-10-CM | POA: Diagnosis not present

## 2014-11-13 ENCOUNTER — Other Ambulatory Visit: Payer: Self-pay | Admitting: Family Medicine

## 2014-11-18 DIAGNOSIS — H43822 Vitreomacular adhesion, left eye: Secondary | ICD-10-CM | POA: Diagnosis not present

## 2014-11-18 DIAGNOSIS — H35031 Hypertensive retinopathy, right eye: Secondary | ICD-10-CM | POA: Diagnosis not present

## 2014-11-23 DIAGNOSIS — G4733 Obstructive sleep apnea (adult) (pediatric): Secondary | ICD-10-CM | POA: Diagnosis not present

## 2014-11-24 ENCOUNTER — Telehealth: Payer: Self-pay | Admitting: Family Medicine

## 2014-11-24 DIAGNOSIS — G453 Amaurosis fugax: Secondary | ICD-10-CM | POA: Insufficient documentation

## 2014-11-24 NOTE — Telephone Encounter (Signed)
This is a very concerning symptom. He needs to be evaluated with a carotid ultrasound first and preferably an office visit as soon as possible. I have placed the order for the imaging study. If he experiences this symptom or any other focal neurological finding (e.g. slurred speech, weakness on one side of the body, facial droop, etc.) he should go to the emergency room right away.

## 2014-11-24 NOTE — Telephone Encounter (Signed)
Pt called because last week in one of his eyes he went blind and could not see out of this for about 20 minutes. He went to his eye doctor who sent him to [facility]. That doctor thought that he might have a clogged artery in his neck and needs to possibly see a vascular doctor. He told [place] to send there findings to [name] for Dr. Bonner Puna to look at. But if we have not received them we can call and get them. [phone]. Can we do a referral based on [facility] report and if so can we place this. Please inform patient on what he needs to do. jw

## 2014-12-03 ENCOUNTER — Emergency Department (HOSPITAL_COMMUNITY)
Admission: EM | Admit: 2014-12-03 | Discharge: 2014-12-03 | Disposition: A | Discharge status: Home / Self Care | Payer: Commercial Managed Care - HMO | Attending: Emergency Medicine | Admitting: Emergency Medicine

## 2014-12-03 ENCOUNTER — Encounter (HOSPITAL_COMMUNITY): Payer: Self-pay | Admitting: *Deleted

## 2014-12-03 DIAGNOSIS — H5462 Unqualified visual loss, left eye, normal vision right eye: Secondary | ICD-10-CM | POA: Diagnosis present

## 2014-12-03 DIAGNOSIS — G453 Amaurosis fugax: Secondary | ICD-10-CM | POA: Insufficient documentation

## 2014-12-03 DIAGNOSIS — I10 Essential (primary) hypertension: Secondary | ICD-10-CM | POA: Diagnosis not present

## 2014-12-03 DIAGNOSIS — G4733 Obstructive sleep apnea (adult) (pediatric): Secondary | ICD-10-CM | POA: Insufficient documentation

## 2014-12-03 DIAGNOSIS — Z87438 Personal history of other diseases of male genital organs: Secondary | ICD-10-CM | POA: Insufficient documentation

## 2014-12-03 DIAGNOSIS — G8929 Other chronic pain: Secondary | ICD-10-CM | POA: Diagnosis not present

## 2014-12-03 DIAGNOSIS — Z87828 Personal history of other (healed) physical injury and trauma: Secondary | ICD-10-CM | POA: Insufficient documentation

## 2014-12-03 DIAGNOSIS — Z79899 Other long term (current) drug therapy: Secondary | ICD-10-CM | POA: Diagnosis not present

## 2014-12-03 DIAGNOSIS — Z8546 Personal history of malignant neoplasm of prostate: Secondary | ICD-10-CM | POA: Diagnosis not present

## 2014-12-03 DIAGNOSIS — Z7982 Long term (current) use of aspirin: Secondary | ICD-10-CM | POA: Insufficient documentation

## 2014-12-03 DIAGNOSIS — Z9981 Dependence on supplemental oxygen: Secondary | ICD-10-CM | POA: Diagnosis not present

## 2014-12-03 DIAGNOSIS — Z8719 Personal history of other diseases of the digestive system: Secondary | ICD-10-CM | POA: Diagnosis not present

## 2014-12-03 DIAGNOSIS — Z86018 Personal history of other benign neoplasm: Secondary | ICD-10-CM | POA: Insufficient documentation

## 2014-12-03 DIAGNOSIS — Z85828 Personal history of other malignant neoplasm of skin: Secondary | ICD-10-CM | POA: Insufficient documentation

## 2014-12-03 DIAGNOSIS — M1990 Unspecified osteoarthritis, unspecified site: Secondary | ICD-10-CM | POA: Insufficient documentation

## 2014-12-03 DIAGNOSIS — E785 Hyperlipidemia, unspecified: Secondary | ICD-10-CM | POA: Insufficient documentation

## 2014-12-03 DIAGNOSIS — F419 Anxiety disorder, unspecified: Secondary | ICD-10-CM | POA: Diagnosis not present

## 2014-12-03 LAB — CBC
HCT: 45.1 % (ref 39.0–52.0)
Hemoglobin: 15.3 g/dL (ref 13.0–17.0)
MCH: 29.1 pg (ref 26.0–34.0)
MCHC: 33.9 g/dL (ref 30.0–36.0)
MCV: 85.9 fL (ref 78.0–100.0)
Platelets: 224 10*3/uL (ref 150–400)
RBC: 5.25 MIL/uL (ref 4.22–5.81)
RDW: 14.3 % (ref 11.5–15.5)
WBC: 7.4 10*3/uL (ref 4.0–10.5)

## 2014-12-03 LAB — DIFFERENTIAL
Basophils Absolute: 0 10*3/uL (ref 0.0–0.1)
Basophils Relative: 0 % (ref 0–1)
EOS PCT: 7 % — AB (ref 0–5)
Eosinophils Absolute: 0.6 10*3/uL (ref 0.0–0.7)
LYMPHS ABS: 1.3 10*3/uL (ref 0.7–4.0)
LYMPHS PCT: 18 % (ref 12–46)
MONOS PCT: 8 % (ref 3–12)
Monocytes Absolute: 0.6 10*3/uL (ref 0.1–1.0)
Neutro Abs: 5 10*3/uL (ref 1.7–7.7)
Neutrophils Relative %: 67 % (ref 43–77)

## 2014-12-03 LAB — COMPREHENSIVE METABOLIC PANEL
ALK PHOS: 56 U/L (ref 38–126)
ALT: 16 U/L — AB (ref 17–63)
AST: 18 U/L (ref 15–41)
Albumin: 3.6 g/dL (ref 3.5–5.0)
Anion gap: 9 (ref 5–15)
BILIRUBIN TOTAL: 0.5 mg/dL (ref 0.3–1.2)
BUN: 17 mg/dL (ref 6–20)
CO2: 22 mmol/L (ref 22–32)
CREATININE: 1.13 mg/dL (ref 0.61–1.24)
Calcium: 8.8 mg/dL — ABNORMAL LOW (ref 8.9–10.3)
Chloride: 106 mmol/L (ref 101–111)
GFR calc non Af Amer: >60 mL/min (ref >60)
Glucose, Bld: 115 mg/dL — ABNORMAL HIGH (ref 70–99)
POTASSIUM: 3.8 mmol/L (ref 3.5–5.1)
Sodium: 137 mmol/L (ref 135–145)
Total Protein: 6.2 g/dL — ABNORMAL LOW (ref 6.5–8.1)

## 2014-12-03 LAB — PROTIME-INR
INR: 0.99 (ref 0.00–1.49)
PROTHROMBIN TIME: 13.2 s (ref 11.6–15.2)

## 2014-12-03 LAB — I-STAT TROPONIN, ED: Troponin i, poc: 0.01 ng/mL (ref 0.00–0.08)

## 2014-12-03 LAB — APTT: APTT: 33 s (ref 24–37)

## 2014-12-03 MED ORDER — ASPIRIN EC 325 MG PO TBEC: 325.0000 mg | DELAYED_RELEASE_TABLET | Freq: Every day | ORAL | Status: DC

## 2014-12-03 NOTE — Telephone Encounter (Signed)
Pt called again-very concerned. He has not had any other symptons but is very worried. Please call pt and advise him what to do He is leaving on a trip June 6. He wants to have this problem taken care of before

## 2014-12-03 NOTE — Telephone Encounter (Signed)
Spoke with pt about messages seen and i told him to go to the ED to get checked out and have ultrasound done. Merrel Crabbe Kennon Holter, CMA

## 2014-12-03 NOTE — ED Notes (Signed)
Pt had an episode of blindness to the left eye 2 months ago. Pt is asymptomatic today. Pt states his PCP sent him to the ED to get a view of his carotids.

## 2014-12-03 NOTE — Discharge Instructions (Signed)
Amaurosis Fugax Amaurosis fugax is a condition in which a person loses sight in one eye. The loss of vision in the affected eye may be total or partial. It usually lasts just a few seconds or minutes. Then, it returns to normal. Occasionally, it may last for several hours. This is caused by interruption of blood flow to the artery that supplies blood to the retina (lining at the back of the eye, contains nerves needed for sight). The temporary loss of blood flow causes symptoms similar to a stroke. The family of symptoms that happen from a loss of blood flow is called a Transient Ischemic Attack (TIA, mini-stroke). In the case of amaurosis fugax, the eye is the organ that is involved. SYMPTOMS   Painless, sudden loss of vision in one eye.  Visual loss is often from the top down, appearing like a curtain being pulled down over the field of vision.  Rapid return of vision. Vision generally comes back in a few minutes to several hours. CAUSES  TIAs and amaurosis fugax are caused by a loss of blood flow. This can be due to a buildup of cholesterol and fats (plaque) in the arteries or the heart. If some of that plaque comes off the artery and gets into the bloodstream, it can flow to the artery that supplies blood to the retina, blocking the flow of blood to the retina. When that happens, vision is lost for as long as the blood flow is interrupted. Factors that make it more likely you will have amaurosis fugax at some point include:  Smoking.  Poorly controlled diabetes.  High blood pressure.  High cholesterol levels. Medical conditions that may increase the risk of an attack of amaurosis fugax include:  Heart disease.  Diseases of the heart valves.  Certain diseases of the blood (sickle cell anemia, leukemia).  Blood clotting (coagulation) disorders.  Artery inflammation (temporal arteritis, giant cell arteritis). Since amaurosis fugax is an "incomplete stroke," in some people it can be a  sign of an increased risk for an actual stroke. A stroke can result in permanent vision loss or loss of other body functions. As a result, caring for yourself after amaurosis fugax means taking many of the same steps you should take to prevent a stroke. HOME CARE INSTRUCTIONS   Only take over-the-counter or prescription medicines for pain, discomfort, or fever as directed by your caregiver.  Take any medicines that are prescribed for control of your blood pressure and cholesterol levels.  Keep diabetes under control as well as possible.  Stop smoking.  Follow diet instructions, if your caregiver has given them to you.  Try to get at least 30 minutes of moderate physical activity every day. If you have not been active, talk to your caregiver about how to get started. SEEK IMMEDIATE MEDICAL CARE IF:   You lose vision in one or both eyes again, even if only for a short period of time.  You lose vision in one eye and it does not recover within a very brief time (less than 5-10 minutes). The sooner you see an eye specialist (ophthalmologist), the better the chance of regaining some vision, in the case of a central retinal artery occlusion (CRAO, blockage of central retinal artery). However, most cases of CRAO result in some degree of permanent visual loss, even with aggressive treatment.  You have symptoms of a stroke:  Weakness in one side of your body.  Difficulty speaking or thinking clearly.  Lack of coordination. Document  Released: 04/26/2008 Document Revised: 10/10/2011 Document Reviewed: 04/26/2008 [facility] Patient Information 2015 [place], [place]. This information is not intended to replace advice given to you by your health care provider. Make sure you discuss any questions you have with your health care provider.

## 2014-12-03 NOTE — ED Notes (Signed)
Pt reports a couple of weeks ago pt went completely blind in his left eye. Episode lasted 20-25 minutes. Pt saw a eye doctor 4/19, had full work up done, nothing acute, was instructed to follow up with PCP. Pt talked to PCP today and was instructed to come to ED to have his carotid arteries evaluated. Pt denies any blurry vision, blindness, double vision at this time.
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
## 2014-12-03 NOTE — Telephone Encounter (Deleted)
Spoke with

## 2014-12-05 ENCOUNTER — Telehealth: Payer: Self-pay | Admitting: Family Medicine

## 2014-12-05 NOTE — Telephone Encounter (Signed)
Pt called because he went to the ER and then was told that he would get a call from his PCP once orders where put in the system for him to see a specialist. Please review ER visit and see what orders are needed. jw

## 2014-12-05 NOTE — Telephone Encounter (Signed)
Discussed the next step with Mr. [name] [name] - will go forward with carotid U/S in the next few days and refer or not depending on those results.   Would be a surgical candidate for CEA for sure, so will refer for stenosis 50% or greater (though would suspect 70% would be only indication for CEA). Pt is on ASA 325mg  and high intensity statin. No complaints.

## 2014-12-08 ENCOUNTER — Other Ambulatory Visit (HOSPITAL_COMMUNITY): Payer: Self-pay | Admitting: Family Medicine

## 2014-12-08 ENCOUNTER — Other Ambulatory Visit (HOSPITAL_COMMUNITY): Payer: Self-pay | Admitting: Unknown Physician Specialty

## 2014-12-08 DIAGNOSIS — G453 Amaurosis fugax: Secondary | ICD-10-CM

## 2014-12-09 ENCOUNTER — Ambulatory Visit (HOSPITAL_COMMUNITY)
Admission: RE | Admit: 2014-12-09 | Discharge: 2014-12-09 | Disposition: A | Discharge status: Home / Self Care | Payer: Commercial Managed Care - HMO | Source: Ambulatory Visit | Attending: Emergency Medicine | Admitting: Emergency Medicine

## 2014-12-09 ENCOUNTER — Telehealth: Payer: Self-pay | Admitting: Family Medicine

## 2014-12-09 DIAGNOSIS — G453 Amaurosis fugax: Secondary | ICD-10-CM | POA: Insufficient documentation

## 2014-12-09 DIAGNOSIS — I6522 Occlusion and stenosis of left carotid artery: Secondary | ICD-10-CM

## 2014-12-09 DIAGNOSIS — I6523 Occlusion and stenosis of bilateral carotid arteries: Secondary | ICD-10-CM | POA: Diagnosis not present

## 2014-12-09 NOTE — Telephone Encounter (Signed)
Discussed results of U/S with [name] [name] who had been made aware of results. We discussed the urgent referral being placed right now for vascular surgeons appointment and the signs/symptoms of neurological deficits. He knows time is brain and that he needs to present to an emergency department if any other symptoms recur/occur. He was leading a motorcycle trip to [place] starting June 6 and I told him that in all likelihood he will need to delay that.

## 2014-12-09 NOTE — Telephone Encounter (Signed)
As preceptor in [facility] I received call from vascular lab to report that patient had carotid dopplers today showing greater than 80% stenosis on the L.  Instructed to call 9-1-1 if he develops further symptoms.  To triage to ordering physician for further referral.  Dalbert Mayotte, MD

## 2014-12-09 NOTE — Assessment & Plan Note (Signed)
Carotid U/S with > 80% stenosis on side of symptoms (left). Urgent referral to vascular surgery made. Pt made aware.

## 2014-12-09 NOTE — Progress Notes (Signed)
VASCULAR LAB PRELIMINARY  PRELIMINARY  PRELIMINARY  PRELIMINARY  Carotid duplex  completed.    Preliminary report:  Right:  1-39% ICA stenosis.  Left:  Greater than 80% internal carotid artery stenosis.  Bilateral:  Vertebral artery flow is antegrade.     Report called to Family Medicine  Dr.  Caroll Rancher, RVT 12/09/2014, 9:55 AM

## 2014-12-11 ENCOUNTER — Telehealth: Payer: Self-pay | Admitting: Family Medicine

## 2014-12-11 NOTE — Telephone Encounter (Signed)
Meeting with the surgeron has been scheduled for May 19. Pt is concerned because dr Bonner Puna said this was serious Wants to know if it is ok to wait

## 2014-12-11 NOTE — Telephone Encounter (Signed)
I have called [name] [name] and discussed this. The risk of an event occurring over any given week (his appt is 5/19) is still low on an absolute scale, but his relative risk is the reason that surgery is so likely indicated. He was able to describe recurrent thromboembolic symptoms and is relieved by the call. He will let me know if any thing else comes up.

## 2014-12-15 ENCOUNTER — Other Ambulatory Visit: Payer: Self-pay | Admitting: *Deleted

## 2014-12-15 DIAGNOSIS — I6523 Occlusion and stenosis of bilateral carotid arteries: Secondary | ICD-10-CM

## 2014-12-18 ENCOUNTER — Encounter: Payer: Self-pay | Admitting: Vascular Surgery

## 2014-12-19 ENCOUNTER — Encounter: Payer: Self-pay | Admitting: Vascular Surgery

## 2014-12-19 ENCOUNTER — Other Ambulatory Visit: Payer: Self-pay

## 2014-12-19 ENCOUNTER — Ambulatory Visit (HOSPITAL_COMMUNITY)
Admission: RE | Admit: 2014-12-19 | Discharge: 2014-12-19 | Disposition: A | Discharge status: Home / Self Care | Payer: Commercial Managed Care - HMO | Source: Ambulatory Visit | Attending: Vascular Surgery | Admitting: Vascular Surgery

## 2014-12-19 ENCOUNTER — Ambulatory Visit (INDEPENDENT_AMBULATORY_CARE_PROVIDER_SITE_OTHER): Payer: Commercial Managed Care - HMO | Admitting: Vascular Surgery

## 2014-12-19 VITALS — BP 121/81 | HR 68 | Resp 18 | Ht 73.0 in | Wt 337.0 lb

## 2014-12-19 DIAGNOSIS — I6523 Occlusion and stenosis of bilateral carotid arteries: Secondary | ICD-10-CM | POA: Diagnosis not present

## 2014-12-19 DIAGNOSIS — I6522 Occlusion and stenosis of left carotid artery: Secondary | ICD-10-CM | POA: Insufficient documentation
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
## 2014-12-23 ENCOUNTER — Encounter (HOSPITAL_COMMUNITY)
Admission: RE | Admit: 2014-12-23 | Discharge: 2014-12-23 | Disposition: A | Discharge status: Home / Self Care | Payer: Commercial Managed Care - HMO | Source: Ambulatory Visit | Attending: Vascular Surgery | Admitting: Vascular Surgery

## 2014-12-23 ENCOUNTER — Encounter (HOSPITAL_COMMUNITY): Payer: Self-pay

## 2014-12-23 DIAGNOSIS — Z8546 Personal history of malignant neoplasm of prostate: Secondary | ICD-10-CM | POA: Diagnosis not present

## 2014-12-23 DIAGNOSIS — I10 Essential (primary) hypertension: Secondary | ICD-10-CM | POA: Diagnosis not present

## 2014-12-23 DIAGNOSIS — E785 Hyperlipidemia, unspecified: Secondary | ICD-10-CM | POA: Diagnosis present

## 2014-12-23 DIAGNOSIS — G4733 Obstructive sleep apnea (adult) (pediatric): Secondary | ICD-10-CM | POA: Diagnosis present

## 2014-12-23 DIAGNOSIS — R51 Headache: Secondary | ICD-10-CM | POA: Diagnosis not present

## 2014-12-23 DIAGNOSIS — F419 Anxiety disorder, unspecified: Secondary | ICD-10-CM | POA: Diagnosis present

## 2014-12-23 DIAGNOSIS — Z888 Allergy status to other drugs, medicaments and biological substances status: Secondary | ICD-10-CM | POA: Diagnosis not present

## 2014-12-23 DIAGNOSIS — Z79899 Other long term (current) drug therapy: Secondary | ICD-10-CM | POA: Diagnosis not present

## 2014-12-23 DIAGNOSIS — Z85828 Personal history of other malignant neoplasm of skin: Secondary | ICD-10-CM | POA: Diagnosis not present

## 2014-12-23 DIAGNOSIS — K219 Gastro-esophageal reflux disease without esophagitis: Secondary | ICD-10-CM | POA: Diagnosis present

## 2014-12-23 DIAGNOSIS — H409 Unspecified glaucoma: Secondary | ICD-10-CM | POA: Diagnosis present

## 2014-12-23 DIAGNOSIS — Z86010 Personal history of colonic polyps: Secondary | ICD-10-CM | POA: Diagnosis not present

## 2014-12-23 DIAGNOSIS — N400 Enlarged prostate without lower urinary tract symptoms: Secondary | ICD-10-CM | POA: Diagnosis present

## 2014-12-23 DIAGNOSIS — Z9079 Acquired absence of other genital organ(s): Secondary | ICD-10-CM | POA: Diagnosis present

## 2014-12-23 DIAGNOSIS — Z7982 Long term (current) use of aspirin: Secondary | ICD-10-CM | POA: Diagnosis not present

## 2014-12-23 DIAGNOSIS — R490 Dysphonia: Secondary | ICD-10-CM | POA: Diagnosis not present

## 2014-12-23 DIAGNOSIS — Z886 Allergy status to analgesic agent status: Secondary | ICD-10-CM | POA: Diagnosis not present

## 2014-12-23 DIAGNOSIS — F329 Major depressive disorder, single episode, unspecified: Secondary | ICD-10-CM | POA: Diagnosis not present

## 2014-12-23 DIAGNOSIS — I6522 Occlusion and stenosis of left carotid artery: Secondary | ICD-10-CM | POA: Diagnosis not present

## 2014-12-23 DIAGNOSIS — R0602 Shortness of breath: Secondary | ICD-10-CM | POA: Diagnosis not present

## 2014-12-23 DIAGNOSIS — E780 Pure hypercholesterolemia: Secondary | ICD-10-CM | POA: Diagnosis not present

## 2014-12-23 DIAGNOSIS — M1990 Unspecified osteoarthritis, unspecified site: Secondary | ICD-10-CM | POA: Diagnosis present

## 2014-12-23 HISTORY — DX: Pneumonia, unspecified organism: J18.9

## 2014-12-23 HISTORY — DX: Umbilical hernia without obstruction or gangrene: K42.9

## 2014-12-23 HISTORY — DX: Cerebral infarction, unspecified: I63.9

## 2014-12-23 HISTORY — DX: Reserved for inherently not codable concepts without codable children: IMO0001

## 2014-12-23 LAB — URINALYSIS, ROUTINE W REFLEX MICROSCOPIC
Bilirubin Urine: NEGATIVE
Glucose, UA: NEGATIVE mg/dL
Ketones, ur: NEGATIVE mg/dL
Leukocytes, UA: NEGATIVE
Nitrite: NEGATIVE
Protein, ur: NEGATIVE mg/dL
SPECIFIC GRAVITY, URINE: 1.024 (ref 1.005–1.030)
Urobilinogen, UA: 0.2 mg/dL (ref 0.0–1.0)
pH: 5.5 (ref 5.0–8.0)

## 2014-12-23 LAB — CBC
HCT: 46.4 % (ref 39.0–52.0)
Hemoglobin: 15.7 g/dL (ref 13.0–17.0)
MCH: 28.9 pg (ref 26.0–34.0)
MCHC: 33.8 g/dL (ref 30.0–36.0)
MCV: 85.3 fL (ref 78.0–100.0)
Platelets: 247 10*3/uL (ref 150–400)
RBC: 5.44 MIL/uL (ref 4.22–5.81)
RDW: 14.3 % (ref 11.5–15.5)
WBC: 8.3 10*3/uL (ref 4.0–10.5)

## 2014-12-23 LAB — TYPE AND SCREEN
ABO/RH(D): O POS
ANTIBODY SCREEN: NEGATIVE

## 2014-12-23 LAB — URINE MICROSCOPIC-ADD ON

## 2014-12-23 LAB — COMPREHENSIVE METABOLIC PANEL
ALT: 19 U/L (ref 17–63)
AST: 19 U/L (ref 15–41)
Albumin: 4.1 g/dL (ref 3.5–5.0)
Alkaline Phosphatase: 66 U/L (ref 38–126)
Anion gap: 10 (ref 5–15)
BUN: 19 mg/dL (ref 6–20)
CHLORIDE: 104 mmol/L (ref 101–111)
CO2: 22 mmol/L (ref 22–32)
Calcium: 9.2 mg/dL (ref 8.9–10.3)
Creatinine, Ser: 1.3 mg/dL — ABNORMAL HIGH (ref 0.61–1.24)
GFR calc Af Amer: >60 mL/min (ref >60)
GFR, EST NON AFRICAN AMERICAN: 55 mL/min — AB (ref >60)
Glucose, Bld: 113 mg/dL — ABNORMAL HIGH (ref 65–99)
POTASSIUM: 4.1 mmol/L (ref 3.5–5.1)
Sodium: 136 mmol/L (ref 135–145)
Total Bilirubin: 0.4 mg/dL (ref 0.3–1.2)
Total Protein: 6.4 g/dL — ABNORMAL LOW (ref 6.5–8.1)

## 2014-12-23 LAB — SURGICAL PCR SCREEN
MRSA, PCR: NEGATIVE
Staphylococcus aureus: NEGATIVE

## 2014-12-23 LAB — PROTIME-INR
INR: 1.05 (ref 0.00–1.49)
PROTHROMBIN TIME: 13.9 s (ref 11.6–15.2)

## 2014-12-23 LAB — APTT: aPTT: 32 seconds (ref 24–37)

## 2014-12-23 LAB — ABO/RH: ABO/RH(D): O POS

## 2014-12-23 NOTE — Pre-Procedure Instructions (Signed)
ULES MARSALA  12/23/2014      [place], [place] [address] Phone: [phone] Fax: [phone]    Your procedure is scheduled on Tuesday, Dec 30, 2014  Report to [facility] Admitting at 5:30 A.M.  Call this number if you have problems the morning of surgery:  [phone]   Remember:  Do not eat food or drink liquids after midnight Monday, Dec 29, 2014  Take these medicines the morning of surgery with A SIP OF WATER: aspirin, sertraline (ZOLOFT)   Stop taking vitamins and herbal medications. Do not take any NSAIDs ie: Ibuprofen, Advil, Naproxen and etc.; stop now.   Do not wear jewelry, make-up or nail polish.  Do not wear lotions, powders, or perfumes.  You may not wear deodorant.  Do not shave 48 hours prior to surgery.  Men may shave face and neck.  Do not bring valuables to the hospital.  [facility] is not responsible for any belongings or valuables.  Contacts, dentures or bridgework may not be worn into surgery.  Leave your suitcase in the car.  After surgery it may be brought to your room.  For patients admitted to the hospital, discharge time will be determined by your treatment team.  Patients discharged the day of surgery will not be allowed to drive home.   Name and phone number of your driver:    Special instructions:   Special Instructions:Special Instructions: [facility] - Preparing for Surgery  Before surgery, you can play an important role.  Because skin is not sterile, your skin needs to be as free of germs as possible.  You can reduce the number of germs on you skin by washing with CHG (chlorahexidine gluconate) soap before surgery.  CHG is an antiseptic cleaner which kills germs and bonds with the skin to continue killing germs even after washing.  Please DO NOT use if you have an allergy to CHG or antibacterial soaps.  If your skin becomes reddened/irritated stop using the  CHG and inform your nurse when you arrive at Short Stay.  Do not shave (including legs and underarms) for at least 48 hours prior to the first CHG shower.  You may shave your face.  Please follow these instructions carefully:   1.  Shower with CHG Soap the night before surgery and the morning of Surgery.  2.  If you choose to wash your hair, wash your hair first as usual with your normal shampoo.  3.  After you shampoo, rinse your hair and body thoroughly to remove the Shampoo.  4.  Use CHG as you would any other liquid soap.  You can apply chg directly  to the skin and wash gently with scrungie or a clean washcloth.  5.  Apply the CHG Soap to your body ONLY FROM THE NECK DOWN.  Do not use on open wounds or open sores.  Avoid contact with your eyes, ears, mouth and genitals (private parts).  Wash genitals (private parts) with your normal soap.  6.  Wash thoroughly, paying special attention to the area where your surgery will be performed.  7.  Thoroughly rinse your body with warm water from the neck down.  8.  DO NOT shower/wash with your normal soap after using and rinsing off the CHG Soap.  9.  Pat yourself dry with a clean towel.  10.  Wear clean pajamas.            11.  Place clean sheets on your bed the night of your first shower and do not sleep with pets.  Day of Surgery  Do not apply any lotions/deodorants the morning of surgery.  Please wear clean clothes to the hospital/surgery center.  Please read over the following fact sheets that you were given. Pain Booklet, Coughing and Deep Breathing, Blood Transfusion Information, MRSA Information and Surgical Site Infection Prevention

## 2014-12-23 NOTE — Progress Notes (Signed)
Pt denies chest pain and stated that he has bouts of SOB with exertion but denies SOB at present. Pt not under the care of a cardiologist and denies having an echo but stated that a stress test and cath was done here at [facility] 15 years ago. Pt chart forwarded to [place], [place] ( anesthesia) to review EKG.

## 2014-12-24 NOTE — Progress Notes (Signed)
Anesthesia Chart Review:  Pt is 69 year old male scheduled for L CEA on 12/30/2014 with Dr. Scot Dock.   PMH includes: HTN, OSA, stroke, glaucoma, prostate cancer, hyperlipidemia. Never smoker. BMI 46. S/p laparoscopic radical prostatectomy/lymphadenectomy 09/05/13. S/p R knee arthroscopy with medial menisectomy 04/25/13.   Preoperative labs reviewed.    EKG 12/03/2014: Sinus rhythm. LAD, consider left anterior fascicular block. No significant change since 03/21/2013.   Carotid duplex US 12/09/2014: -Findings consistent with 1-39% stenosis involving the R ICA -Findings consistent with >80% stenosis involving the L ICA.   Nuclear stress test 12/18/2002: -Fixed defect involving the basilar inferior wall. No reversible defects are identified. The calculated EF is 58%.   Cardiac cath 02/22/2000: 1. Normal left ventricular function. 2. 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 3. No evidence of renal artery stenosis.  If no changes, I anticipate pt can proceed with surgery as scheduled.   Willeen Cass, FNP-BC [facility] Short Stay Surgical Center/Anesthesiology Phone: [phone] 12/24/2014 3:43 PM

## 2014-12-24 NOTE — Progress Notes (Signed)
Spoke with Colletta Maryland at Dr. Nicole Cella office to make MD aware of abnormal UA.

## 2014-12-27 ENCOUNTER — Other Ambulatory Visit: Payer: Self-pay | Admitting: Family Medicine

## 2014-12-29 MED ORDER — CHLORHEXIDINE GLUCONATE CLOTH 2 % EX PADS: 6.0000 | MEDICATED_PAD | Freq: Once | CUTANEOUS | Status: DC

## 2014-12-29 MED ORDER — SODIUM CHLORIDE 0.9 % IV SOLN: INTRAVENOUS | Status: DC

## 2014-12-29 MED ORDER — CEFUROXIME SODIUM 1.5 G IJ SOLR
1.5000 g | INTRAMUSCULAR | Status: AC
  Administered 2014-12-30: 1.5 g via INTRAVENOUS
  Filled 2014-12-29: qty 1.5

## 2014-12-30 ENCOUNTER — Inpatient Hospital Stay (HOSPITAL_COMMUNITY): Payer: Commercial Managed Care - HMO | Admitting: Anesthesiology

## 2014-12-30 ENCOUNTER — Encounter (HOSPITAL_COMMUNITY): Payer: Self-pay | Admitting: *Deleted

## 2014-12-30 ENCOUNTER — Encounter (HOSPITAL_COMMUNITY)
Admission: RE | Disposition: A | Discharge status: Home / Self Care | Payer: Self-pay | Source: Ambulatory Visit | Attending: Vascular Surgery

## 2014-12-30 ENCOUNTER — Inpatient Hospital Stay (HOSPITAL_COMMUNITY): Payer: Commercial Managed Care - HMO | Admitting: Emergency Medicine

## 2014-12-30 ENCOUNTER — Inpatient Hospital Stay (HOSPITAL_COMMUNITY)
Admission: RE | Admit: 2014-12-30 | Discharge: 2015-01-01 | DRG: 039 | Disposition: A | Discharge status: Home / Self Care | Payer: Commercial Managed Care - HMO | Source: Ambulatory Visit | Attending: Vascular Surgery | Admitting: Vascular Surgery

## 2014-12-30 DIAGNOSIS — I10 Essential (primary) hypertension: Secondary | ICD-10-CM | POA: Diagnosis present

## 2014-12-30 DIAGNOSIS — I6522 Occlusion and stenosis of left carotid artery: Secondary | ICD-10-CM | POA: Diagnosis present

## 2014-12-30 DIAGNOSIS — Z7982 Long term (current) use of aspirin: Secondary | ICD-10-CM

## 2014-12-30 DIAGNOSIS — Z86010 Personal history of colonic polyps: Secondary | ICD-10-CM

## 2014-12-30 DIAGNOSIS — Z85828 Personal history of other malignant neoplasm of skin: Secondary | ICD-10-CM | POA: Diagnosis not present

## 2014-12-30 DIAGNOSIS — E785 Hyperlipidemia, unspecified: Secondary | ICD-10-CM | POA: Diagnosis present

## 2014-12-30 DIAGNOSIS — K219 Gastro-esophageal reflux disease without esophagitis: Secondary | ICD-10-CM | POA: Diagnosis present

## 2014-12-30 DIAGNOSIS — Z886 Allergy status to analgesic agent status: Secondary | ICD-10-CM

## 2014-12-30 DIAGNOSIS — F419 Anxiety disorder, unspecified: Secondary | ICD-10-CM | POA: Diagnosis present

## 2014-12-30 DIAGNOSIS — M1990 Unspecified osteoarthritis, unspecified site: Secondary | ICD-10-CM | POA: Diagnosis present

## 2014-12-30 DIAGNOSIS — F329 Major depressive disorder, single episode, unspecified: Secondary | ICD-10-CM | POA: Diagnosis present

## 2014-12-30 DIAGNOSIS — Z9079 Acquired absence of other genital organ(s): Secondary | ICD-10-CM | POA: Diagnosis present

## 2014-12-30 DIAGNOSIS — R490 Dysphonia: Secondary | ICD-10-CM | POA: Diagnosis not present

## 2014-12-30 DIAGNOSIS — E780 Pure hypercholesterolemia: Secondary | ICD-10-CM | POA: Diagnosis present

## 2014-12-30 DIAGNOSIS — N400 Enlarged prostate without lower urinary tract symptoms: Secondary | ICD-10-CM | POA: Diagnosis present

## 2014-12-30 DIAGNOSIS — Z79899 Other long term (current) drug therapy: Secondary | ICD-10-CM

## 2014-12-30 DIAGNOSIS — H409 Unspecified glaucoma: Secondary | ICD-10-CM | POA: Diagnosis present

## 2014-12-30 DIAGNOSIS — Z888 Allergy status to other drugs, medicaments and biological substances status: Secondary | ICD-10-CM

## 2014-12-30 DIAGNOSIS — R51 Headache: Secondary | ICD-10-CM | POA: Diagnosis not present

## 2014-12-30 DIAGNOSIS — Z8546 Personal history of malignant neoplasm of prostate: Secondary | ICD-10-CM

## 2014-12-30 DIAGNOSIS — G4733 Obstructive sleep apnea (adult) (pediatric): Secondary | ICD-10-CM | POA: Diagnosis present

## 2014-12-30 HISTORY — PX: PATCH ANGIOPLASTY: SHX6230

## 2014-12-30 HISTORY — PX: ENDARTERECTOMY: SHX5162

## 2014-12-30 LAB — CREATININE, SERUM
Creatinine, Ser: 1.27 mg/dL — ABNORMAL HIGH (ref 0.61–1.24)
GFR calc Af Amer: >60 mL/min (ref >60)
GFR, EST NON AFRICAN AMERICAN: 56 mL/min — AB (ref >60)

## 2014-12-30 LAB — CBC
HCT: 39.7 % (ref 39.0–52.0)
Hemoglobin: 13.2 g/dL (ref 13.0–17.0)
MCH: 28.7 pg (ref 26.0–34.0)
MCHC: 33.2 g/dL (ref 30.0–36.0)
MCV: 86.3 fL (ref 78.0–100.0)
Platelets: 195 10*3/uL (ref 150–400)
RBC: 4.6 MIL/uL (ref 4.22–5.81)
RDW: 14.6 % (ref 11.5–15.5)
WBC: 10.1 10*3/uL (ref 4.0–10.5)

## 2014-12-30 SURGERY — ENDARTERECTOMY, CAROTID
Anesthesia: General | Site: Neck | Laterality: Left

## 2014-12-30 MED ORDER — LABETALOL HCL 5 MG/ML IV SOLN: 10.0000 mg | INTRAVENOUS | Status: DC | PRN

## 2014-12-30 MED ORDER — ENOXAPARIN SODIUM 40 MG/0.4ML ~~LOC~~ SOLN
40.0000 mg | SUBCUTANEOUS | Status: DC
  Administered 2014-12-31 – 2015-01-01 (×2): 40 mg via SUBCUTANEOUS
  Filled 2014-12-30 (×2): qty 0.4

## 2014-12-30 MED ORDER — PHENYLEPHRINE HCL 10 MG/ML IJ SOLN
INTRAMUSCULAR | Status: AC
  Filled 2014-12-30: qty 1

## 2014-12-30 MED ORDER — NEOSTIGMINE METHYLSULFATE 10 MG/10ML IV SOLN
INTRAVENOUS | Status: DC | PRN
  Administered 2014-12-30: 3 mg via INTRAVENOUS

## 2014-12-30 MED ORDER — ROCURONIUM BROMIDE 100 MG/10ML IV SOLN
INTRAVENOUS | Status: DC | PRN
  Administered 2014-12-30: 50 mg via INTRAVENOUS
  Administered 2014-12-30: 20 mg via INTRAVENOUS

## 2014-12-30 MED ORDER — LIDOCAINE HCL (CARDIAC) 20 MG/ML IV SOLN
INTRAVENOUS | Status: AC
  Filled 2014-12-30: qty 5

## 2014-12-30 MED ORDER — PROPOFOL 10 MG/ML IV BOLUS
INTRAVENOUS | Status: DC | PRN
  Administered 2014-12-30: 200 mg via INTRAVENOUS

## 2014-12-30 MED ORDER — ACETAMINOPHEN 650 MG RE SUPP: 325.0000 mg | RECTAL | Status: DC | PRN

## 2014-12-30 MED ORDER — LABETALOL HCL 5 MG/ML IV SOLN
INTRAVENOUS | Status: AC
  Filled 2014-12-30: qty 4

## 2014-12-30 MED ORDER — OXYCODONE HCL 5 MG/5ML PO SOLN: 5.0000 mg | Freq: Once | ORAL | Status: AC | PRN

## 2014-12-30 MED ORDER — DEXTRAN 40 IN SALINE 10-0.9 % IV SOLN
INTRAVENOUS | Status: AC
  Filled 2014-12-30: qty 500

## 2014-12-30 MED ORDER — ARTIFICIAL TEARS OP OINT
TOPICAL_OINTMENT | OPHTHALMIC | Status: AC
  Filled 2014-12-30: qty 3.5

## 2014-12-30 MED ORDER — LIDOCAINE HCL (CARDIAC) 20 MG/ML IV SOLN
INTRAVENOUS | Status: DC | PRN
  Administered 2014-12-30: 60 mg via INTRAVENOUS

## 2014-12-30 MED ORDER — ROCURONIUM BROMIDE 50 MG/5ML IV SOLN
INTRAVENOUS | Status: AC
  Filled 2014-12-30: qty 1

## 2014-12-30 MED ORDER — ASPIRIN EC 325 MG PO TBEC
325.0000 mg | DELAYED_RELEASE_TABLET | Freq: Every day | ORAL | Status: DC
  Administered 2014-12-31 – 2015-01-01 (×2): 325 mg via ORAL
  Filled 2014-12-30 (×2): qty 1

## 2014-12-30 MED ORDER — DEXTRAN 40 IN SALINE 10-0.9 % IV SOLN
INTRAVENOUS | Status: DC | PRN
  Administered 2014-12-30: 500 mL

## 2014-12-30 MED ORDER — MAGNESIUM SULFATE 2 GM/50ML IV SOLN: 2.0000 g | Freq: Every day | INTRAVENOUS | Status: DC | PRN

## 2014-12-30 MED ORDER — PROPOFOL 10 MG/ML IV BOLUS
INTRAVENOUS | Status: AC
  Filled 2014-12-30: qty 20

## 2014-12-30 MED ORDER — LACTATED RINGERS IV SOLN
INTRAVENOUS | Status: DC | PRN
  Administered 2014-12-30 (×2): via INTRAVENOUS

## 2014-12-30 MED ORDER — FENTANYL CITRATE (PF) 100 MCG/2ML IJ SOLN
INTRAMUSCULAR | Status: DC | PRN
  Administered 2014-12-30: 50 ug via INTRAVENOUS
  Administered 2014-12-30: 150 ug via INTRAVENOUS
  Administered 2014-12-30: 50 ug via INTRAVENOUS

## 2014-12-30 MED ORDER — LISINOPRIL 20 MG PO TABS
20.0000 mg | ORAL_TABLET | Freq: Every day | ORAL | Status: DC
  Administered 2014-12-30 – 2015-01-01 (×3): 20 mg via ORAL
  Filled 2014-12-30 (×3): qty 1

## 2014-12-30 MED ORDER — OXYCODONE-ACETAMINOPHEN 5-325 MG PO TABS
ORAL_TABLET | ORAL | Status: AC
  Administered 2014-12-30: 1 via ORAL
  Filled 2014-12-30: qty 1

## 2014-12-30 MED ORDER — HYDRALAZINE HCL 20 MG/ML IJ SOLN: 5.0000 mg | INTRAMUSCULAR | Status: DC | PRN

## 2014-12-30 MED ORDER — LIDOCAINE-EPINEPHRINE (PF) 1 %-1:200000 IJ SOLN
INTRAMUSCULAR | Status: DC | PRN
  Administered 2014-12-30: 10 mL

## 2014-12-30 MED ORDER — PHENOL 1.4 % MT LIQD: 1.0000 | OROMUCOSAL | Status: DC | PRN

## 2014-12-30 MED ORDER — FENTANYL CITRATE (PF) 100 MCG/2ML IJ SOLN
INTRAMUSCULAR | Status: AC
  Administered 2014-12-30: 25 ug via INTRAVENOUS
  Filled 2014-12-30: qty 2

## 2014-12-30 MED ORDER — DOCUSATE SODIUM 100 MG PO CAPS
100.0000 mg | ORAL_CAPSULE | Freq: Every day | ORAL | Status: DC
  Administered 2014-12-31 – 2015-01-01 (×2): 100 mg via ORAL
  Filled 2014-12-30 (×2): qty 1

## 2014-12-30 MED ORDER — SODIUM CHLORIDE 0.9 % IV SOLN: 500.0000 mL | Freq: Once | INTRAVENOUS | Status: AC | PRN

## 2014-12-30 MED ORDER — BISACODYL 10 MG RE SUPP
10.0000 mg | Freq: Every day | RECTAL | Status: DC | PRN
Start: 2014-12-30 — End: 2015-01-01

## 2014-12-30 MED ORDER — HEPARIN SODIUM (PORCINE) 1000 UNIT/ML IJ SOLN
INTRAMUSCULAR | Status: DC | PRN
  Administered 2014-12-30: 10000 [IU] via INTRAVENOUS

## 2014-12-30 MED ORDER — MIDAZOLAM HCL 2 MG/2ML IJ SOLN
INTRAMUSCULAR | Status: AC
  Filled 2014-12-30: qty 2

## 2014-12-30 MED ORDER — EPHEDRINE SULFATE 50 MG/ML IJ SOLN
INTRAMUSCULAR | Status: AC
  Filled 2014-12-30: qty 1

## 2014-12-30 MED ORDER — SUCCINYLCHOLINE CHLORIDE 20 MG/ML IJ SOLN
INTRAMUSCULAR | Status: AC
  Filled 2014-12-30: qty 1

## 2014-12-30 MED ORDER — OXYCODONE HCL 5 MG PO TABS
ORAL_TABLET | ORAL | Status: AC
  Administered 2014-12-30: 5 mg via ORAL
  Filled 2014-12-30: qty 1

## 2014-12-30 MED ORDER — DEXTROSE 5 % IV SOLN
1.5000 g | Freq: Two times a day (BID) | INTRAVENOUS | Status: AC
  Administered 2014-12-30 – 2014-12-31 (×2): 1.5 g via INTRAVENOUS
  Filled 2014-12-30 (×2): qty 1.5

## 2014-12-30 MED ORDER — LISINOPRIL-HYDROCHLOROTHIAZIDE 20-12.5 MG PO TABS: 1.0000 | ORAL_TABLET | Freq: Every day | ORAL | Status: DC

## 2014-12-30 MED ORDER — SODIUM CHLORIDE 0.9 % IJ SOLN
INTRAMUSCULAR | Status: AC
  Filled 2014-12-30: qty 10

## 2014-12-30 MED ORDER — MORPHINE SULFATE 2 MG/ML IJ SOLN
2.0000 mg | INTRAMUSCULAR | Status: DC | PRN
  Administered 2014-12-30: 2 mg via INTRAVENOUS
  Administered 2014-12-30 – 2014-12-31 (×2): 4 mg via INTRAVENOUS
  Filled 2014-12-30: qty 1
  Filled 2014-12-30: qty 2
  Filled 2014-12-30: qty 1
  Filled 2014-12-30: qty 2

## 2014-12-30 MED ORDER — METOPROLOL TARTRATE 1 MG/ML IV SOLN: 2.0000 mg | INTRAVENOUS | Status: DC | PRN

## 2014-12-30 MED ORDER — POTASSIUM CHLORIDE CRYS ER 20 MEQ PO TBCR: 20.0000 meq | EXTENDED_RELEASE_TABLET | Freq: Every day | ORAL | Status: DC | PRN

## 2014-12-30 MED ORDER — ONDANSETRON HCL 4 MG/2ML IJ SOLN: 4.0000 mg | Freq: Four times a day (QID) | INTRAMUSCULAR | Status: DC | PRN

## 2014-12-30 MED ORDER — OXYCODONE-ACETAMINOPHEN 5-325 MG PO TABS
1.0000 | ORAL_TABLET | ORAL | Status: DC | PRN
  Administered 2014-12-30: 1 via ORAL
  Administered 2014-12-31 – 2015-01-01 (×3): 2 via ORAL
  Filled 2014-12-30 (×3): qty 2

## 2014-12-30 MED ORDER — PHENYLEPHRINE HCL 10 MG/ML IJ SOLN
10.0000 mg | INTRAVENOUS | Status: DC | PRN
  Administered 2014-12-30: 40 ug/min via INTRAVENOUS

## 2014-12-30 MED ORDER — FENTANYL CITRATE (PF) 250 MCG/5ML IJ SOLN
INTRAMUSCULAR | Status: AC
  Filled 2014-12-30: qty 5

## 2014-12-30 MED ORDER — FENTANYL CITRATE (PF) 100 MCG/2ML IJ SOLN
25.0000 ug | INTRAMUSCULAR | Status: DC | PRN
  Administered 2014-12-30: 25 ug via INTRAVENOUS
  Administered 2014-12-30: 50 ug via INTRAVENOUS
  Administered 2014-12-30: 25 ug via INTRAVENOUS

## 2014-12-30 MED ORDER — ARTIFICIAL TEARS OP OINT
TOPICAL_OINTMENT | OPHTHALMIC | Status: DC | PRN
Start: 2014-12-30 — End: 2014-12-30
  Administered 2014-12-30: 1 via OPHTHALMIC

## 2014-12-30 MED ORDER — ROSUVASTATIN CALCIUM 5 MG PO TABS
5.0000 mg | ORAL_TABLET | Freq: Every day | ORAL | Status: DC
  Administered 2014-12-30 – 2015-01-01 (×3): 5 mg via ORAL
  Filled 2014-12-30 (×3): qty 1

## 2014-12-30 MED ORDER — ALUM & MAG HYDROXIDE-SIMETH 200-200-20 MG/5ML PO SUSP: 15.0000 mL | ORAL | Status: DC | PRN

## 2014-12-30 MED ORDER — HYDROCHLOROTHIAZIDE 12.5 MG PO CAPS
12.5000 mg | ORAL_CAPSULE | Freq: Every day | ORAL | Status: DC
  Administered 2014-12-30 – 2015-01-01 (×3): 12.5 mg via ORAL
  Filled 2014-12-30 (×3): qty 1

## 2014-12-30 MED ORDER — PANTOPRAZOLE SODIUM 40 MG PO TBEC
40.0000 mg | DELAYED_RELEASE_TABLET | Freq: Every day | ORAL | Status: DC
  Administered 2014-12-30 – 2015-01-01 (×3): 40 mg via ORAL
  Filled 2014-12-30 (×2): qty 1

## 2014-12-30 MED ORDER — SODIUM CHLORIDE 0.9 % IR SOLN
Status: DC | PRN
  Administered 2014-12-30: 500 mL

## 2014-12-30 MED ORDER — SODIUM CHLORIDE 0.9 % IV SOLN
INTRAVENOUS | Status: DC
  Administered 2014-12-30: 75 mL/h via INTRAVENOUS

## 2014-12-30 MED ORDER — 0.9 % SODIUM CHLORIDE (POUR BTL) OPTIME
TOPICAL | Status: DC | PRN
  Administered 2014-12-30: 2000 mL

## 2014-12-30 MED ORDER — GLYCOPYRROLATE 0.2 MG/ML IJ SOLN
INTRAMUSCULAR | Status: DC | PRN
  Administered 2014-12-30: 0.4 mg via INTRAVENOUS

## 2014-12-30 MED ORDER — GUAIFENESIN-DM 100-10 MG/5ML PO SYRP: 15.0000 mL | ORAL_SOLUTION | ORAL | Status: DC | PRN

## 2014-12-30 MED ORDER — OXYCODONE HCL 5 MG PO TABS
5.0000 mg | ORAL_TABLET | Freq: Once | ORAL | Status: AC | PRN
  Administered 2014-12-30: 5 mg via ORAL

## 2014-12-30 MED ORDER — NEOSTIGMINE METHYLSULFATE 10 MG/10ML IV SOLN
INTRAVENOUS | Status: AC
  Filled 2014-12-30: qty 1

## 2014-12-30 MED ORDER — GLYCOPYRROLATE 0.2 MG/ML IJ SOLN
INTRAMUSCULAR | Status: AC
  Filled 2014-12-30: qty 2

## 2014-12-30 MED ORDER — ONDANSETRON HCL 4 MG/2ML IJ SOLN
INTRAMUSCULAR | Status: AC
  Filled 2014-12-30: qty 2

## 2014-12-30 MED ORDER — LABETALOL HCL 5 MG/ML IV SOLN
INTRAVENOUS | Status: DC | PRN
  Administered 2014-12-30 (×2): 5 mg via INTRAVENOUS

## 2014-12-30 MED ORDER — THROMBIN 20000 UNITS EX SOLR
CUTANEOUS | Status: AC
  Filled 2014-12-30: qty 20000

## 2014-12-30 MED ORDER — MORPHINE SULFATE 4 MG/ML IJ SOLN
INTRAMUSCULAR | Status: AC
  Administered 2014-12-30: 4 mg
  Filled 2014-12-30: qty 1

## 2014-12-30 MED ORDER — SERTRALINE HCL 100 MG PO TABS
100.0000 mg | ORAL_TABLET | Freq: Every day | ORAL | Status: DC
  Administered 2014-12-31 – 2015-01-01 (×2): 100 mg via ORAL
  Filled 2014-12-30 (×2): qty 1

## 2014-12-30 MED ORDER — LIDOCAINE HCL (PF) 1 % IJ SOLN
INTRAMUSCULAR | Status: AC
  Filled 2014-12-30: qty 30

## 2014-12-30 MED ORDER — PROTAMINE SULFATE 10 MG/ML IV SOLN
INTRAVENOUS | Status: DC | PRN
  Administered 2014-12-30 (×4): 10 mg via INTRAVENOUS

## 2014-12-30 MED ORDER — ACETAMINOPHEN 325 MG PO TABS
325.0000 mg | ORAL_TABLET | ORAL | Status: DC | PRN
  Administered 2014-12-31: 650 mg via ORAL
  Filled 2014-12-30: qty 2

## 2014-12-30 MED ORDER — ONDANSETRON HCL 4 MG/2ML IJ SOLN
INTRAMUSCULAR | Status: DC | PRN
  Administered 2014-12-30: 4 mg via INTRAVENOUS

## 2014-12-30 MED ORDER — PROTAMINE SULFATE 10 MG/ML IV SOLN
INTRAVENOUS | Status: AC
  Filled 2014-12-30: qty 5

## 2014-12-30 SURGICAL SUPPLY — 42 items
BAG DECANTER FOR FLEXI CONT (MISCELLANEOUS) ×3 IMPLANT
CANISTER SUCTION 2500CC (MISCELLANEOUS) ×3 IMPLANT
CANNULA VESSEL 3MM 2 BLNT TIP (CANNULA) ×6 IMPLANT
CATH ROBINSON RED A/P 18FR (CATHETERS) ×3 IMPLANT
CLIP TI MEDIUM 24 (CLIP) ×3 IMPLANT
CLIP TI WIDE RED SMALL 24 (CLIP) ×3 IMPLANT
CRADLE DONUT ADULT HEAD (MISCELLANEOUS) ×3 IMPLANT
DRAIN CHANNEL 15F RND FF W/TCR (WOUND CARE) IMPLANT
ELECT REM PT RETURN 9FT ADLT (ELECTROSURGICAL) ×3
ELECTRODE REM PT RTRN 9FT ADLT (ELECTROSURGICAL) ×1 IMPLANT
EVACUATOR SILICONE 100CC (DRAIN) IMPLANT
GLOVE BIO SURGEON STRL SZ7.5 (GLOVE) ×3 IMPLANT
GLOVE BIOGEL PI IND STRL 8 (GLOVE) ×1 IMPLANT
GLOVE BIOGEL PI INDICATOR 8 (GLOVE) ×2
GOWN STRL REUS W/ TWL LRG LVL3 (GOWN DISPOSABLE) ×3 IMPLANT
GOWN STRL REUS W/TWL LRG LVL3 (GOWN DISPOSABLE) ×9
KIT BASIN OR (CUSTOM PROCEDURE TRAY) ×3 IMPLANT
KIT ROOM TURNOVER OR (KITS) ×3 IMPLANT
LIQUID BAND (GAUZE/BANDAGES/DRESSINGS) ×3 IMPLANT
NEEDLE 18GX1X1/2 (RX/OR ONLY) (NEEDLE) ×3 IMPLANT
NEEDLE HYPO 25GX1X1/2 BEV (NEEDLE) ×3 IMPLANT
NEEDLE HYPO 25X1 1.5 SAFETY (NEEDLE) ×3 IMPLANT
NS IRRIG 1000ML POUR BTL (IV SOLUTION) ×6 IMPLANT
PACK CAROTID (CUSTOM PROCEDURE TRAY) ×3 IMPLANT
PAD ARMBOARD 7.5X6 YLW CONV (MISCELLANEOUS) ×6 IMPLANT
PATCH VASCULAR VASCU GUARD 1X6 (Vascular Products) ×3 IMPLANT
SHUNT CAROTID BYPASS 10 (VASCULAR PRODUCTS) ×3 IMPLANT
SHUNT CAROTID BYPASS 12FRX15.5 (VASCULAR PRODUCTS) IMPLANT
SPONGE INTESTINAL PEANUT (DISPOSABLE) ×3 IMPLANT
SPONGE SURGIFOAM ABS GEL 100 (HEMOSTASIS) IMPLANT
SUT PROLENE 5 0 C 1 24 (SUTURE) ×3 IMPLANT
SUT PROLENE 6 0 BV (SUTURE) ×12 IMPLANT
SUT PROLENE 7 0 BV 1 (SUTURE) IMPLANT
SUT SILK 2 0 FS (SUTURE) ×3 IMPLANT
SUT SILK 3 0 (SUTURE) ×3
SUT SILK 3-0 18XBRD TIE 12 (SUTURE) ×1 IMPLANT
SUT VIC AB 3-0 SH 27 (SUTURE) ×3
SUT VIC AB 3-0 SH 27X BRD (SUTURE) ×1 IMPLANT
SUT VICRYL 4-0 PS2 18IN ABS (SUTURE) ×3 IMPLANT
SYR 20CC LL (SYRINGE) ×3 IMPLANT
SYR CONTROL 10ML LL (SYRINGE) ×3 IMPLANT
WATER STERILE IRR 1000ML POUR (IV SOLUTION) ×3 IMPLANT

## 2014-12-30 NOTE — Transfer of Care (Signed)
Immediate Anesthesia Transfer of Care Note  Patient: [name] [name]  Procedure(s) Performed: Procedure(s): ENDARTERECTOMY LEFT INTERNAL CAROTID ARTERY (Left) PATCH ANGIOPLASTY using 1cm x 6cm bovine pericardial patch.  (Left)  Patient Location: PACU  Anesthesia Type:General  Level of Consciousness: awake, alert  and oriented  Airway & Oxygen Therapy: Patient Spontanous Breathing and Patient connected to nasal cannula oxygen  Post-op Assessment: Report given to RN and Patient moving all extremities X 4  Post vital signs: Reviewed and stable  Last Vitals:  Filed Vitals:   12/30/14 0540  BP: 133/70  Pulse: 78  Temp: 36.5 C  Resp: 18    Complications: No apparent anesthesia complications

## 2014-12-30 NOTE — Anesthesia Procedure Notes (Signed)
Procedure Name: Intubation Date/Time: 12/30/2014 7:45 AM Performed by: Sampson Si E Pre-anesthesia Checklist: Patient identified, Emergency Drugs available, Suction available, Patient being monitored and Timeout performed Patient Re-evaluated:Patient Re-evaluated prior to inductionOxygen Delivery Method: Circle system utilized Preoxygenation: Pre-oxygenation with 100% oxygen Intubation Type: IV induction Ventilation: Mask ventilation without difficulty and Oral airway inserted - appropriate to patient size Laryngoscope Size: Mac and 3 Grade View: Grade I Tube type: Oral Tube size: 7.5 mm Number of attempts: 1 Airway Equipment and Method: Stylet Placement Confirmation: ETT inserted through vocal cords under direct vision,  positive ETCO2 and breath sounds checked- equal and bilateral Secured at: 22 cm Tube secured with: Tape Dental Injury: Teeth and Oropharynx as per pre-operative assessment

## 2014-12-30 NOTE — Op Note (Signed)
    NAME: [name] [name]   MRN: [id number] DOB: 10-17-1945    DATE OF OPERATION: 12/30/2014  PREOP DIAGNOSIS: greater than 80% left carotid stenosis  POSTOP DIAGNOSIS: same  PROCEDURE:  1. Left carotid endarterectomy with bovine pericardial patch angioplasty 2. Resection of redundant left common carotid artery  SURGEON: Judeth Cornfield. Scot Dock, MD, FACS  ASSIST: Silva Bandy, [facility]  ANESTHESIA: Gen.   EBL: 100 cc  INDICATIONS: [name] [name] is a 69 y.o. male who was found to have a greater than 80% left carotid stenosis. Left carotid endarterectomy was recommended in order to lower his risk of future stroke.  FINDINGS: redundant common carotid and internal carotid artery. 80% left carotid stenosis  TECHNIQUE: The patient was taken to the operating room and received general and aesthetic. Arterial line had been placed by anesthesia. Left neck was prepped and draped in usual sterile fashion. An incision was made along the anterior border the sternocleidomastoid and dissection carried down to the common carotid artery which was dissected free and controlled with a Rummel tourniquet. The artery was quite redundant. The vagus nerve was anterior and it was a large crossing nerve above the bifurcation which I preserved. I felt that this could potentially be a nonrecurrent laryngeal nerve. I preserved this. Next I controlled the superior thyroid artery and the external carotid artery. The internal carotid artery was also controlled. The patient was heparinized. ACT was 263. Clamps were then placed on the internal and the external and the common carotid artery. A longitudinal arteriotomy was made in the common carotid artery and this was extended through the plaque into the internal carotid artery.  The tension was placed into the internal carotid artery, backbled and then placed into the common carotid artery and secured with Rummel tourniquet. Flow was reestablished to the shunt. The endarterectomy  plane was established proximally and the plaque was sharply divided. Eversion endarterectomy was performed of the external carotid artery. Distally there was a nice taper in the internal carotid artery. The plaque was removed. All loose debris was removed and the artery irrigated with copious amounts of heparin and dextran. 2 tacking sutures were placed distally. The artery was quite redundant and I elected to resect a segment of the common carotid artery. I ended up resecting approximately 3 cm common carotid artery. This back wall was sewn end-to-end with 60 proline suture. A bovine pericardial patch was then sewn using continuous 60 proline suture. Prior to completing the patch closure the shunt was removed. The arteries were backbled and flushed appropriately and the anastomosis completed. Flow was reestablished first to the external carotid artery and into the internal carotid artery. She was a good pulse distal to the patch and a good Doppler signal with good diastolic flow. Hemostasis was obtained in the wound. The heparin was partially reversed with protamine. The deep layer was closed with running 30 Michael. The platysma was closed with running 30 Vicryl suture. The skin was closed with 40 subcutaneous stitch. Dermabond was applied. The patient tolerated the procedure well and was transferred to the recovery room in stable condition. All needle and sponge counts are correct.  Deitra Mayo, MD, FACS [facility]  DATE OF DICTATION:   12/30/2014
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
## 2014-12-30 NOTE — Anesthesia Preprocedure Evaluation (Addendum)
Anesthesia Evaluation  Patient identified by MRN, date of birth, ID band Patient awake    Reviewed: Allergy & Precautions, NPO status , Patient's Chart, lab work & pertinent test results  Airway Mallampati: II  TM Distance: >3 FB Neck ROM: full    Dental  (+) Partial Upper, Teeth Intact, Dental Advisory Given   Pulmonary shortness of breath, asthma , sleep apnea ,  breath sounds clear to auscultation        Cardiovascular hypertension, + Peripheral Vascular Disease Rhythm:regular Rate:Normal     Neuro/Psych  Headaches, Anxiety Depression CVA    GI/Hepatic GERD-  ,  Endo/Other  Morbid obesity  Renal/GU      Musculoskeletal  (+) Arthritis -,   Abdominal   Peds  Hematology   Anesthesia Other Findings   Reproductive/Obstetrics                            Anesthesia Physical Anesthesia Plan  ASA: III  Anesthesia Plan: General   Post-op Pain Management:    Induction: Intravenous  Airway Management Planned: Oral ETT  Additional Equipment: Arterial line  Intra-op Plan:   Post-operative Plan: Extubation in OR  Informed Consent: I have reviewed the patients History and Physical, chart, labs and discussed the procedure including the risks, benefits and alternatives for the proposed anesthesia with the patient or authorized representative who has indicated his/her understanding and acceptance.     Plan Discussed with: CRNA, Anesthesiologist and Surgeon  Anesthesia Plan Comments:         Anesthesia Quick Evaluation

## 2014-12-30 NOTE — Progress Notes (Signed)
   VASCULAR SURGERY POST OP CHECK:  * Stable post op  * Anticipate D/C in AM   SUBJECTIVE: No complaints  PHYSICAL EXAM: Filed Vitals:   12/30/14 1230 12/30/14 1234 12/30/14 1245 12/30/14 1300  BP:      Pulse: 84 71 66 66  Temp:    98 F (36.7 C)  TempSrc:      Resp: 19 19 13 12   Height:      Weight:      SpO2: 100% 98% 98% 99%   Neuro intact Incsion looks fine.   Active Problems:   Symptomatic stenosis of left carotid artery   [name] [name] Beeper: [phone] 12/30/2014

## 2014-12-30 NOTE — Anesthesia Postprocedure Evaluation (Signed)
  Anesthesia Post-op Note  Patient: [name] [name]  Procedure(s) Performed: Procedure(s): ENDARTERECTOMY LEFT INTERNAL CAROTID ARTERY (Left) PATCH ANGIOPLASTY using 1cm x 6cm bovine pericardial patch.  (Left)  Patient Location: PACU  Anesthesia Type:General  Level of Consciousness: awake, alert , oriented and patient cooperative  Airway and Oxygen Therapy: Patient Spontanous Breathing and Patient connected to nasal cannula oxygen  Post-op Pain: none  Post-op Assessment: Post-op Vital signs reviewed, Patient's Cardiovascular Status Stable, Respiratory Function Stable, Patent Airway, No signs of Nausea or vomiting and Pain level controlled  Post-op Vital Signs: Reviewed and stable  Last Vitals:  Filed Vitals:   12/30/14 1300  BP:   Pulse: 66  Temp: 36.7 C  Resp: 12    Complications: No apparent anesthesia complications

## 2014-12-30 NOTE — Interval H&P Note (Signed)
History and Physical Interval Note:  12/30/2014 7:23 AM  [name] [name]  has presented today for surgery, with the diagnosis of Left carotid endarterectomy I65.22  The various methods of treatment have been discussed with the patient and family. After consideration of risks, benefits and other options for treatment, the patient has consented to  Procedure(s): ENDARTERECTOMY CAROTID (Left) as a surgical intervention .  The patient's history has been reviewed, patient examined, no change in status, stable for surgery.  I have reviewed the patient's chart and labs.  Questions were answered to the patient's satisfaction.     Deitra Mayo

## 2014-12-31 ENCOUNTER — Encounter (HOSPITAL_COMMUNITY): Payer: Self-pay | Admitting: Vascular Surgery

## 2014-12-31 LAB — BASIC METABOLIC PANEL
ANION GAP: 9 (ref 5–15)
BUN: 14 mg/dL (ref 6–20)
CALCIUM: 8.3 mg/dL — AB (ref 8.9–10.3)
CHLORIDE: 101 mmol/L (ref 101–111)
CO2: 24 mmol/L (ref 22–32)
CREATININE: 1.07 mg/dL (ref 0.61–1.24)
GFR calc Af Amer: >60 mL/min (ref >60)
GFR calc non Af Amer: >60 mL/min (ref >60)
Glucose, Bld: 124 mg/dL — ABNORMAL HIGH (ref 65–99)
POTASSIUM: 3.8 mmol/L (ref 3.5–5.1)
Sodium: 134 mmol/L — ABNORMAL LOW (ref 135–145)

## 2014-12-31 LAB — CBC
HCT: 41.4 % (ref 39.0–52.0)
Hemoglobin: 13.5 g/dL (ref 13.0–17.0)
MCH: 28.1 pg (ref 26.0–34.0)
MCHC: 32.6 g/dL (ref 30.0–36.0)
MCV: 86.3 fL (ref 78.0–100.0)
Platelets: 208 10*3/uL (ref 150–400)
RBC: 4.8 MIL/uL (ref 4.22–5.81)
RDW: 14.9 % (ref 11.5–15.5)
WBC: 9 10*3/uL (ref 4.0–10.5)

## 2014-12-31 LAB — POCT ACTIVATED CLOTTING TIME: Activated Clotting Time: 263 seconds

## 2014-12-31 MED ORDER — DEXAMETHASONE 4 MG PO TABS
4.0000 mg | ORAL_TABLET | Freq: Once | ORAL | Status: AC
  Administered 2014-12-31: 4 mg via ORAL
  Filled 2014-12-31: qty 1

## 2014-12-31 MED ORDER — OXYCODONE-ACETAMINOPHEN 5-325 MG PO TABS: 1.0000 | ORAL_TABLET | ORAL | Status: DC | PRN

## 2014-12-31 NOTE — Progress Notes (Signed)
   VASCULAR SURGERY ASSESSMENT & PLAN:  * 1 Day Post-Op s/p: Left CEA  *  Decadron (po) for Headache. BP has been under good control.   * Home later today if headache improving.   * VQI on ASA and Statin  SUBJECTIVE: C/O headache. Otherwise no complaints. Has eaten, ambulated, and voided.  PHYSICAL EXAM: Filed Vitals:   12/30/14 2310 12/30/14 2312 12/31/14 0507 12/31/14 0510  BP:  123/54  113/47  Pulse:  71  66  Temp: 98.6 F (37 C)  97.9 F (36.6 C)   TempSrc: Oral  Oral   Resp:  13  13  Height:      Weight:      SpO2:  96%  95%   Neuro intact Incision mild swelling.   LABS: Lab Results  Component Value Date   WBC 9.0 12/31/2014   HGB 13.5 12/31/2014   HCT 41.4 12/31/2014   MCV 86.3 12/31/2014   PLT 208 12/31/2014   Lab Results  Component Value Date   CREATININE 1.07 12/31/2014   Lab Results  Component Value Date   INR 1.05 12/23/2014   CBG (last 3)  No results for input(s): GLUCAP in the last 72 hours.  Active Problems:   Symptomatic stenosis of left carotid artery    Gae Gallop Beeper: [phone] 12/31/2014

## 2014-12-31 NOTE — Progress Notes (Signed)
       Headache has somewhat improved, but patient wants to stay another day. Says he lives alone. His blood pressure has been well controlled. Neuro intact. Anticipate d/c tomorrow.  Virgina Jock, PA-C Vascular Surgery

## 2015-01-01 ENCOUNTER — Telehealth: Payer: Self-pay | Admitting: Vascular Surgery

## 2015-01-01 NOTE — Telephone Encounter (Signed)
Spoke with pt to schedule, dpm °

## 2015-01-01 NOTE — Progress Notes (Addendum)
  [facility] Progress Note  01/01/2015 7:29 AM 2 Days Post-Op  Subjective:  Headache improved significantly. Complaining of some voice hoarseness. Denies problems with swallowing  Tmax 98.3 BP sys 90s-120s 02 95% RA  Filed Vitals:   01/01/15 0340  BP:   Pulse:   Temp: 98 F (36.7 C)  Resp:     Physical Exam: Incisions:  Left neck incision clean, dry and intact. No hematoma.  Neuro: voice hoarseness, tongue midline, smile symmetric, 5/5 strength upper and lower extremities  CBC    Component Value Date/Time   WBC 9.0 12/31/2014 0420   RBC 4.80 12/31/2014 0420   HGB 13.5 12/31/2014 0420   HCT 41.4 12/31/2014 0420   PLT 208 12/31/2014 0420   MCV 86.3 12/31/2014 0420   MCH 28.1 12/31/2014 0420   MCHC 32.6 12/31/2014 0420   RDW 14.9 12/31/2014 0420   LYMPHSABS 1.3 12/03/2014 1756   MONOABS 0.6 12/03/2014 1756   EOSABS 0.6 12/03/2014 1756   BASOSABS 0.0 12/03/2014 1756    BMET    Component Value Date/Time   NA 134* 12/31/2014 0420   K 3.8 12/31/2014 0420   CL 101 12/31/2014 0420   CO2 24 12/31/2014 0420   GLUCOSE 124* 12/31/2014 0420   BUN 14 12/31/2014 0420   CREATININE 1.07 12/31/2014 0420   CREATININE 1.01 07/15/2014 1350   CALCIUM 8.3* 12/31/2014 0420   GFRNONAA >60 12/31/2014 0420   GFRAA >60 12/31/2014 0420    INR    Component Value Date/Time   INR 1.05 12/23/2014 1430     Intake/Output Summary (Last 24 hours) at 01/01/15 0729 Last data filed at 12/31/14 0900  Gross per 24 hour  Intake    120 ml  Output      0 ml  Net    120 ml     Assessment:  69 y.o. [name] is s/p: left carotid endarterectomy 2 Days Post-Op  Plan: -Headache significantly improved. BP has been well controlled. -Neuro intact except for some voice hoarseness, likely due retraction of vagus or nonrecurrent laryngeal nerve that was anterior to carotid artery. Assured patient that his symptoms will improve.  -No swallowing difficulties. Has voided and  ambulated. -Discharge home today. F/u in 2 weeks.  -Is on ASA and statin.    Virgina Jock, PA-C [facility] Office: [phone] Pager: [phone] 01/01/2015 7:29 AM  Agree with plans for D/C.  Deitra Mayo, MD, [place] [phone] Office: [phone]

## 2015-01-01 NOTE — Telephone Encounter (Signed)
-----   Message from Mena Goes, RN sent at 12/30/2014 11:36 AM EDT ----- Regarding: Schedule   ----- Message -----    From: Alvia Grove, PA-C    Sent: 12/30/2014  10:16 AM      To: Vvs Charge Pool  S/p left CEA 12/30/14  F/u with Dr. Scot Dock in 2 weeks.   Thanks Maudie Mercury

## 2015-01-01 NOTE — Progress Notes (Signed)
Discussed AVS and prescription with pt. Verbalized understanding. Pain improved, and controlled with oral pain med. IV and monitor removed, CCMD notified. Discharged home.
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
## 2015-01-10 ENCOUNTER — Telehealth: Payer: Self-pay | Admitting: Vascular Surgery

## 2015-01-10 NOTE — Telephone Encounter (Signed)
The patient called this morning. Reports no specific focal deficits. Reports that he just feels tired and fatigue in general. No problem with his wound. No fever. No respiratory difficulty. He is approximately 10 days out from carotid surgery. I explained that he has not been out of the house and is a widower. He felt he may just be depressed following surgery. I reviewed the focal deficits in the wound issues to be concerned about. Explained that this is a not uncommon following major surgery. He will notify should he develop any more specific issues. Otherwise will have routine follow-up with Dr. Scot Dock is scheduled in another 1-2 weeks

## 2015-01-12 ENCOUNTER — Encounter: Payer: Self-pay | Admitting: Vascular Surgery

## 2015-01-15 ENCOUNTER — Ambulatory Visit (INDEPENDENT_AMBULATORY_CARE_PROVIDER_SITE_OTHER): Payer: Self-pay | Admitting: Vascular Surgery

## 2015-01-15 ENCOUNTER — Encounter: Payer: Self-pay | Admitting: Vascular Surgery

## 2015-01-15 VITALS — BP 121/82 | HR 76 | Ht 72.0 in | Wt 336.0 lb

## 2015-01-15 DIAGNOSIS — I6522 Occlusion and stenosis of left carotid artery: Secondary | ICD-10-CM

## 2015-01-15 DIAGNOSIS — Z48812 Encounter for surgical aftercare following surgery on the circulatory system: Secondary | ICD-10-CM

## 2015-01-15 NOTE — Progress Notes (Signed)
   Patient name: [name] [name] MRN: [id number] DOB: 1945-11-10 Sex: male  REASON FOR VISIT: Follow up after left carotid endarterectomy.  [facility] PATIENT  HPI: [name] [name] is a 69 y.o. male who was found to have a greater than 80% left carotid stenosis. Left carotid endarterectomy was recommended in order to lower his risk of future stroke. On 12/30/2014, he underwent left carotid endarterectomy with bovine pericardial patch angioplasty. He also required resection of redundant left common carotid artery. Of note, preoperative duplex showed no significant carotid stenosis on the right side.  The patient returns for a 2 week follow up visit. His only complaint is some hoarseness and also some problems in swallowing water. He does fine with coffee and does fine with solid food. He denies any focal weakness or paresthesias.  He is on aspirin and is on a statin.  Current Outpatient Prescriptions  Medication Sig Dispense Refill  . aspirin EC 325 MG tablet Take 1 tablet (325 mg total) by mouth daily. 30 tablet 0  . CRESTOR 5 MG tablet TAKE ONE TABLET BY MOUTH ONCE DAILY. 90 tablet 3  . ibuprofen (ADVIL,MOTRIN) 200 MG tablet Take 200 mg by mouth every 6 (six) hours as needed for moderate pain.     [name] lisinopril-hydrochlorothiazide (PRINZIDE,ZESTORETIC) 20-12.5 MG per tablet Take 1 tablet by mouth daily. 30 tablet 5  . oxyCODONE-acetaminophen (PERCOCET/ROXICET) 5-325 MG per tablet Take 1-2 tablets by mouth every 4 (four) hours as needed for moderate pain. 30 tablet 0  . sertraline (ZOLOFT) 100 MG tablet TAKE ONE TABLET BY MOUTH DAILY. 90 tablet 0   No current facility-administered medications for this visit.   REVIEW OF SYSTEMS: Valu.Nieves ] denotes positive finding; [  ] denotes negative finding  CARDIOVASCULAR:  [ ]  chest pain   [ ]  dyspnea on exertion    CONSTITUTIONAL:  [ ]  fever   [ ]  chills  PHYSICAL EXAM: Filed Vitals:   01/15/15 1133 01/15/15 1135  BP: 117/81 121/82  Pulse: 76   Height: 6'  (1.829 m)   Weight: 336 lb (152.409 kg)   SpO2: 94%    GENERAL: The patient is a well-nourished male, in no acute distress. The vital signs are documented above. CARDIOVASCULAR: There is a regular rate and rhythm. PULMONARY: There is good air exchange bilaterally without wheezing or rales. His left neck incision is healing nicely. NEURO: He does have some hoarseness. He has no focal weakness or paresthesias in his upper extremities or lower extremities.  MEDICAL ISSUES:  STATUS POST LEFT CAROTID ENDARTERECTOMY WITH BOVINE PERICARDIAL PATCH ANOPLASTY AND RESECTION OF REDUNDANT LEFT COMMON CAROTID ARTERY: The patient does have some hoarseness which I think should improve with time. He had a very high dissection. In addition he has some mild problems swallowing liquids. I suspected both of these issues will resolve with time. If the hoarseness does not improve then we have discussed potentially having him evaluated by ENT. He had no significant carotid stenosis on the right. I've ordered a follow up carotid duplex scan in 6 months and I'll see him back at that time. He knows to call sooner if he has problems. In the meantime he knows to continue his aspirin and statin.  Deitra Mayo [facility] [place]: [phone]

## 2015-01-15 NOTE — Addendum Note (Signed)
Addended by: Dorthula Rue L on: 01/15/2015 02:32 PM   Modules accepted: Orders

## 2015-01-23 DIAGNOSIS — G4733 Obstructive sleep apnea (adult) (pediatric): Secondary | ICD-10-CM | POA: Diagnosis not present

## 2015-01-29 ENCOUNTER — Other Ambulatory Visit: Payer: Self-pay | Admitting: Sports Medicine

## 2015-01-29 NOTE — Telephone Encounter (Signed)
Needs to be filled with [facility], not [facility]. Thanks!

## 2015-02-13 ENCOUNTER — Telehealth: Payer: Self-pay

## 2015-02-13 ENCOUNTER — Telehealth: Payer: Self-pay | Admitting: Family Medicine

## 2015-02-13 DIAGNOSIS — R491 Aphonia: Secondary | ICD-10-CM

## 2015-02-13 DIAGNOSIS — Z9889 Other specified postprocedural states: Secondary | ICD-10-CM

## 2015-02-13 DIAGNOSIS — R490 Dysphonia: Secondary | ICD-10-CM

## 2015-02-13 NOTE — Telephone Encounter (Signed)
Phone call from pt.  Reported he continues to have difficulty talking, loses his voice intermittently, "strangles" on water, and has coughing spells.  Reported that @ last office visit, Dr. Scot Dock suggested if his symptoms didn't improve, he may need to see ENT.  Requesting a referral to ENT.  Discussed with Dr. Scot Dock.  Gave v.o. to refer pt. to ENT for further evaluation.

## 2015-02-13 NOTE — Telephone Encounter (Signed)
Requesting a referral to ENT, had surgery about 6 weeks ago, doesn't have much of a voice, hard to catch his breath at times, his surgeon said he needs to see ENT. He has an appt, just needs to the referral to be put in. Pt is scheduled with Dr. Benjamine Mola on July 25 at 1:40 pm.

## 2015-02-13 NOTE — Telephone Encounter (Signed)
I have placed the referral.  Thanks

## 2015-02-13 NOTE — Telephone Encounter (Signed)
Spoke with pt, he is scheduled to see Dr Benjamine Mola in [place] on 02/23/2015 @ 1:40pm. He is aware to contact his PCP for his insurance referral.  I have faxed records to [phone]  Dpm

## 2015-02-22 DIAGNOSIS — G4733 Obstructive sleep apnea (adult) (pediatric): Secondary | ICD-10-CM | POA: Diagnosis not present

## 2015-02-23 DIAGNOSIS — J3801 Paralysis of vocal cords and larynx, unilateral: Secondary | ICD-10-CM | POA: Diagnosis not present

## 2015-02-23 DIAGNOSIS — R490 Dysphonia: Secondary | ICD-10-CM | POA: Diagnosis not present

## 2015-03-12 DIAGNOSIS — R1310 Dysphagia, unspecified: Secondary | ICD-10-CM | POA: Diagnosis not present

## 2015-03-12 DIAGNOSIS — J3801 Paralysis of vocal cords and larynx, unilateral: Secondary | ICD-10-CM | POA: Diagnosis not present

## 2015-03-12 DIAGNOSIS — R490 Dysphonia: Secondary | ICD-10-CM | POA: Insufficient documentation

## 2015-03-12 DIAGNOSIS — R479 Unspecified speech disturbances: Secondary | ICD-10-CM | POA: Insufficient documentation

## 2015-03-12 DIAGNOSIS — J383 Other diseases of vocal cords: Secondary | ICD-10-CM | POA: Diagnosis not present

## 2015-03-12 DIAGNOSIS — R1313 Dysphagia, pharyngeal phase: Secondary | ICD-10-CM | POA: Diagnosis not present

## 2015-03-23 DIAGNOSIS — C61 Malignant neoplasm of prostate: Secondary | ICD-10-CM | POA: Diagnosis not present

## 2015-03-25 ENCOUNTER — Telehealth: Payer: Self-pay | Admitting: *Deleted

## 2015-03-25 DIAGNOSIS — G4733 Obstructive sleep apnea (adult) (pediatric): Secondary | ICD-10-CM | POA: Diagnosis not present

## 2015-03-25 DIAGNOSIS — C61 Malignant neoplasm of prostate: Secondary | ICD-10-CM

## 2015-03-25 NOTE — Telephone Encounter (Signed)
Patient states that he needs a new referral for [facility] with Dr. Alinda Money.  He is established with them but is due for a 6 month appt.  Patient has [employer] and they require a new referral every 47months or 6 visits whichever happens first.  Will forward to Md to place referral.  Johnney Ou

## 2015-03-26 NOTE — Telephone Encounter (Signed)
[facility] # [id number]  03/26/15 thru 09/22/15  Approved for 6 visits.

## 2015-03-27 DIAGNOSIS — N529 Male erectile dysfunction, unspecified: Secondary | ICD-10-CM | POA: Diagnosis not present

## 2015-03-27 DIAGNOSIS — N393 Stress incontinence (female) (male): Secondary | ICD-10-CM | POA: Diagnosis not present

## 2015-03-27 DIAGNOSIS — C61 Malignant neoplasm of prostate: Secondary | ICD-10-CM | POA: Diagnosis not present

## 2015-04-02 ENCOUNTER — Other Ambulatory Visit: Payer: Self-pay | Admitting: Family Medicine

## 2015-04-27 ENCOUNTER — Ambulatory Visit (INDEPENDENT_AMBULATORY_CARE_PROVIDER_SITE_OTHER): Payer: Commercial Managed Care - HMO | Admitting: Family Medicine

## 2015-04-27 ENCOUNTER — Encounter: Payer: Self-pay | Admitting: Family Medicine

## 2015-04-27 VITALS — BP 131/78 | HR 70 | Temp 98.3°F | Ht 73.0 in | Wt 344.9 lb

## 2015-04-27 DIAGNOSIS — E669 Obesity, unspecified: Secondary | ICD-10-CM | POA: Diagnosis not present

## 2015-04-27 DIAGNOSIS — J3800 Paralysis of vocal cords and larynx, unspecified: Secondary | ICD-10-CM | POA: Diagnosis not present

## 2015-04-27 DIAGNOSIS — Z23 Encounter for immunization: Secondary | ICD-10-CM | POA: Diagnosis not present

## 2015-04-27 DIAGNOSIS — F329 Major depressive disorder, single episode, unspecified: Secondary | ICD-10-CM | POA: Diagnosis not present

## 2015-04-27 DIAGNOSIS — F32A Depression, unspecified: Secondary | ICD-10-CM

## 2015-04-27 MED ORDER — BUPROPION HCL ER (SR) 150 MG PO TB12: 150.0000 mg | ORAL_TABLET | Freq: Two times a day (BID) | ORAL | Status: DC

## 2015-04-27 NOTE — Assessment & Plan Note (Signed)
Surgical complication s/p Left CEA. Seen by Dr. Benjamine Mola, [facility] who referred him to Dr. Johnna Acosta, DO at [facility] (p [phone]). Will re-refer him for a surgery to attempt to correct this problem.

## 2015-04-27 NOTE — Assessment & Plan Note (Signed)
-   Continue SSRI: counseled about SEs, expected course and duration of therapy. - Start wellbutrin. - He will continue to reach out to family/friends as social supports related to his needs.  - Follow up in 6 weeks to monitor safety, efficacy and tolerability. Will discuss counselor referral at that time.

## 2015-04-27 NOTE — Assessment & Plan Note (Signed)
Significantly worsening due to mood disorder. Hopeful for improvement in craving-based intake with wellbutrin. BMI qualifies for referral to bariatric surgery, though this was not the focus of this visit. He will continue to attempt therapeutic lifestyle changes.

## 2015-04-27 NOTE — Patient Instructions (Signed)
Thank you for coming in today!  - Start taking wellbutrin 150mg  once daily for 3 days, then increase to twice daily until we follow up in 6 weeks.  - I have referred you to ENT at [facility] clinic's number is [phone]. Feel free to call any time with questions or concerns. We will answer any questions after hours with our 24-hour emergency line at that number as well.   - Dr. Bonner Puna
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
## 2015-05-04 DIAGNOSIS — J3801 Paralysis of vocal cords and larynx, unilateral: Secondary | ICD-10-CM | POA: Diagnosis not present

## 2015-05-04 DIAGNOSIS — R490 Dysphonia: Secondary | ICD-10-CM | POA: Diagnosis not present

## 2015-05-04 DIAGNOSIS — R1313 Dysphagia, pharyngeal phase: Secondary | ICD-10-CM | POA: Diagnosis not present

## 2015-05-04 DIAGNOSIS — J383 Other diseases of vocal cords: Secondary | ICD-10-CM | POA: Diagnosis not present

## 2015-05-04 DIAGNOSIS — R1310 Dysphagia, unspecified: Secondary | ICD-10-CM | POA: Diagnosis not present

## 2015-05-18 DIAGNOSIS — I639 Cerebral infarction, unspecified: Secondary | ICD-10-CM | POA: Insufficient documentation

## 2015-05-18 DIAGNOSIS — Z888 Allergy status to other drugs, medicaments and biological substances status: Secondary | ICD-10-CM | POA: Diagnosis not present

## 2015-05-18 DIAGNOSIS — J383 Other diseases of vocal cords: Secondary | ICD-10-CM | POA: Diagnosis not present

## 2015-05-18 DIAGNOSIS — Z79899 Other long term (current) drug therapy: Secondary | ICD-10-CM | POA: Diagnosis not present

## 2015-05-18 DIAGNOSIS — Z9119 Patient's noncompliance with other medical treatment and regimen: Secondary | ICD-10-CM | POA: Diagnosis not present

## 2015-05-18 DIAGNOSIS — G4733 Obstructive sleep apnea (adult) (pediatric): Secondary | ICD-10-CM | POA: Diagnosis not present

## 2015-05-18 DIAGNOSIS — I10 Essential (primary) hypertension: Secondary | ICD-10-CM | POA: Diagnosis not present

## 2015-05-18 DIAGNOSIS — Z9989 Dependence on other enabling machines and devices: Secondary | ICD-10-CM | POA: Diagnosis not present

## 2015-05-18 DIAGNOSIS — Z7982 Long term (current) use of aspirin: Secondary | ICD-10-CM | POA: Diagnosis not present

## 2015-05-18 DIAGNOSIS — R490 Dysphonia: Secondary | ICD-10-CM | POA: Diagnosis not present

## 2015-05-25 DIAGNOSIS — I10 Essential (primary) hypertension: Secondary | ICD-10-CM | POA: Diagnosis not present

## 2015-05-25 DIAGNOSIS — Z9989 Dependence on other enabling machines and devices: Secondary | ICD-10-CM | POA: Diagnosis not present

## 2015-05-25 DIAGNOSIS — G4733 Obstructive sleep apnea (adult) (pediatric): Secondary | ICD-10-CM | POA: Diagnosis not present

## 2015-05-25 DIAGNOSIS — Z79899 Other long term (current) drug therapy: Secondary | ICD-10-CM | POA: Diagnosis not present

## 2015-05-25 DIAGNOSIS — Z888 Allergy status to other drugs, medicaments and biological substances status: Secondary | ICD-10-CM | POA: Diagnosis not present

## 2015-05-25 DIAGNOSIS — Z7982 Long term (current) use of aspirin: Secondary | ICD-10-CM | POA: Diagnosis not present

## 2015-05-25 DIAGNOSIS — Z9119 Patient's noncompliance with other medical treatment and regimen: Secondary | ICD-10-CM | POA: Diagnosis not present

## 2015-05-25 DIAGNOSIS — J383 Other diseases of vocal cords: Secondary | ICD-10-CM | POA: Diagnosis not present

## 2015-05-25 DIAGNOSIS — R490 Dysphonia: Secondary | ICD-10-CM | POA: Diagnosis not present

## 2015-05-26 DIAGNOSIS — Z7982 Long term (current) use of aspirin: Secondary | ICD-10-CM | POA: Diagnosis not present

## 2015-05-26 DIAGNOSIS — Z888 Allergy status to other drugs, medicaments and biological substances status: Secondary | ICD-10-CM | POA: Diagnosis not present

## 2015-05-26 DIAGNOSIS — Z9119 Patient's noncompliance with other medical treatment and regimen: Secondary | ICD-10-CM | POA: Diagnosis not present

## 2015-05-26 DIAGNOSIS — I10 Essential (primary) hypertension: Secondary | ICD-10-CM | POA: Diagnosis not present

## 2015-05-26 DIAGNOSIS — Z9989 Dependence on other enabling machines and devices: Secondary | ICD-10-CM | POA: Diagnosis not present

## 2015-05-26 DIAGNOSIS — G4733 Obstructive sleep apnea (adult) (pediatric): Secondary | ICD-10-CM | POA: Diagnosis not present

## 2015-05-26 DIAGNOSIS — Z79899 Other long term (current) drug therapy: Secondary | ICD-10-CM | POA: Diagnosis not present

## 2015-05-26 DIAGNOSIS — J383 Other diseases of vocal cords: Secondary | ICD-10-CM | POA: Diagnosis not present

## 2015-05-26 DIAGNOSIS — R490 Dysphonia: Secondary | ICD-10-CM | POA: Diagnosis not present

## 2015-06-08 ENCOUNTER — Ambulatory Visit: Payer: Commercial Managed Care - HMO | Admitting: Family Medicine

## 2015-06-27 ENCOUNTER — Other Ambulatory Visit: Payer: Self-pay | Admitting: Family Medicine

## 2015-06-30 DIAGNOSIS — J3801 Paralysis of vocal cords and larynx, unilateral: Secondary | ICD-10-CM | POA: Diagnosis not present

## 2015-06-30 DIAGNOSIS — R1310 Dysphagia, unspecified: Secondary | ICD-10-CM | POA: Diagnosis not present

## 2015-06-30 DIAGNOSIS — J383 Other diseases of vocal cords: Secondary | ICD-10-CM | POA: Diagnosis not present

## 2015-06-30 DIAGNOSIS — R490 Dysphonia: Secondary | ICD-10-CM | POA: Diagnosis not present

## 2015-07-17 ENCOUNTER — Encounter: Payer: Self-pay | Admitting: Vascular Surgery

## 2015-07-22 ENCOUNTER — Ambulatory Visit (HOSPITAL_COMMUNITY)
Admission: RE | Admit: 2015-07-22 | Discharge: 2015-07-22 | Disposition: A | Discharge status: Home / Self Care | Payer: Commercial Managed Care - HMO | Source: Ambulatory Visit | Attending: Vascular Surgery | Admitting: Vascular Surgery

## 2015-07-22 ENCOUNTER — Telehealth: Payer: Self-pay | Admitting: Family Medicine

## 2015-07-22 ENCOUNTER — Encounter: Payer: Self-pay | Admitting: Vascular Surgery

## 2015-07-22 ENCOUNTER — Ambulatory Visit (INDEPENDENT_AMBULATORY_CARE_PROVIDER_SITE_OTHER): Payer: Commercial Managed Care - HMO | Admitting: Vascular Surgery

## 2015-07-22 VITALS — BP 100/64 | HR 75 | Temp 97.0°F | Resp 16 | Ht 73.0 in | Wt 347.0 lb

## 2015-07-22 DIAGNOSIS — I6522 Occlusion and stenosis of left carotid artery: Secondary | ICD-10-CM | POA: Diagnosis not present

## 2015-07-22 DIAGNOSIS — Z48812 Encounter for surgical aftercare following surgery on the circulatory system: Secondary | ICD-10-CM | POA: Diagnosis not present

## 2015-07-22 NOTE — Telephone Encounter (Signed)
Tried to call pt. Phone just rang. No voicemail or answering machine. Ottis Stain, CMA

## 2015-07-22 NOTE — Addendum Note (Signed)
Addended by: Dorthula Rue L on: 07/22/2015 03:25 PM   Modules accepted: Orders

## 2015-07-22 NOTE — Telephone Encounter (Signed)
[facility]

## 2015-07-22 NOTE — Telephone Encounter (Signed)
Needs referral to [facility].  This is about his cartorid artery.  He has had the surgery and this is a followup. He doesn't know the name of the practice

## 2015-07-22 NOTE — Telephone Encounter (Signed)
[employer] has been done for patient since 07/10/15 for his appts with Dr. Scot Dock today. Auth # [id number] 07/22/15 to 01/18/16. Approved for 6 visits.
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
## 2015-08-06 ENCOUNTER — Other Ambulatory Visit: Payer: Self-pay | Admitting: Family Medicine

## 2015-08-19 ENCOUNTER — Ambulatory Visit (INDEPENDENT_AMBULATORY_CARE_PROVIDER_SITE_OTHER): Payer: Commercial Managed Care - HMO | Admitting: Family Medicine

## 2015-08-19 ENCOUNTER — Encounter: Payer: Self-pay | Admitting: Family Medicine

## 2015-08-19 VITALS — BP 132/78 | HR 74 | Temp 98.0°F | Ht 73.0 in | Wt 347.0 lb

## 2015-08-19 DIAGNOSIS — I10 Essential (primary) hypertension: Secondary | ICD-10-CM

## 2015-08-19 DIAGNOSIS — R05 Cough: Secondary | ICD-10-CM | POA: Insufficient documentation

## 2015-08-19 DIAGNOSIS — R059 Cough, unspecified: Secondary | ICD-10-CM

## 2015-08-19 MED ORDER — HYDROCHLOROTHIAZIDE 25 MG PO TABS: 25.0000 mg | ORAL_TABLET | Freq: Every day | ORAL | Status: DC

## 2015-08-19 NOTE — Patient Instructions (Signed)
There are many things that could be contributing to your cough. I think we should start at the easiest things:  - STOP taking lisinopril-HCTZ as lisinopril can cause the cough (I have prescribed a slightly higher dose of hydrochlorothiazide that you should take alone for blood pressure).  - Let me know in about 1 week if this has helped. If it has NOT helped, we can go back on the lisinopril-HCTZ and try another medication.  - Make sure you follow up with Dr. Rowe Clack as a vocal cord spasm can certainly cause this problem.   We WILL get to the bottom of this.   If you have severe shortness of breath please go to the emergency room right away. Call us at [phone] if I can help in any other way.   Thanks! - Dr. Bonner Puna

## 2015-08-19 NOTE — Assessment & Plan Note (Signed)
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 Has Dx intrinsic asthma based on spirometry and BD response but no wheezing. Will discontinue lisinopril x 1 week and see if this helps. If not improved, will restart lisinopril as BP is controlled, and trial PPI. Could also consider barium swallow to r/o silent aspiration. Will defer any imaging at this time.

## 2015-08-19 NOTE — Progress Notes (Signed)
Subjective: [name] [name] is a 70 y.o. male with a history of OSA on CPAP, carotid artery stenosis s/p left CEA 2016, obesity presenting for cough.  Since CEA in May 2016, he has noticed persistent daily cough occasionally debilitating for 20 - 30 minutes at a time. He has become dizzy during these episodes and used a wall to prop himself up. Cough brings nothing up, denies wheezing, no albuterol tried. Has not been able to use CPAP; it wakes him up despite trying different masks and trialing each for months. No fevers. No leg swelling above normal. No heartburn symptoms. No relation to eating or drinking. Has been on lisinopril for > 1 year.   He had a vocal cord paralysis complicating this and underwent larygnoscopy with fat harvesting and injection of left vocal fold. These struggles to catch his breath while coughing began long before this procedure. Has f/u with Dr. Mila Homer (ENT) 09/01/2015.  - Non-smoker  Objective: BP 132/78 mmHg  Pulse 74  Temp(Src) 98 F (36.7 C) (Oral)  Ht 6\' 1"  (1.854 m)  Wt 347 lb (157.398 kg)  BMI 45.79 kg/m2  SpO2 96% Gen: Obese, well-appearing 70 y.o. male in no distress HEENT: Normocephalic, sclerae clear, conjunctivae normal, pupils equal and reactive, nares normal, moist mucous membranes, posterior oropharynx clear, good dentition Neck: Linear scar on L neck overlying carotid with minimal bruit.  Pulm: Non-labored breathing ambient air; CTAB, no wheezes or crackles CV: Regular rate, no murmur; radial, DP and PT pulses 2+ bilaterally; R > L 1+ pitting LE edema without tenderness, no JVD, cap refill < 2 sec.  Assessment/Plan: [name] is a 70 y.o. male here for cough.  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 Has Dx intrinsic asthma based on spirometry and BD response but no wheezing. Will discontinue lisinopril x 1 week and  see if this helps. If not improved, will restart lisinopril as BP is controlled, and trial PPI. Could also consider barium swallow to r/o silent aspiration. Will defer any imaging at this time.

## 2015-08-19 NOTE — Assessment & Plan Note (Signed)
Will increase HCTZ dose while stopping lisinopril. BP at goal.

## 2015-09-01 ENCOUNTER — Telehealth: Payer: Self-pay | Admitting: Family Medicine

## 2015-09-01 DIAGNOSIS — J383 Other diseases of vocal cords: Secondary | ICD-10-CM | POA: Diagnosis not present

## 2015-09-01 DIAGNOSIS — R490 Dysphonia: Secondary | ICD-10-CM | POA: Diagnosis not present

## 2015-09-01 DIAGNOSIS — J3801 Paralysis of vocal cords and larynx, unilateral: Secondary | ICD-10-CM | POA: Diagnosis not present

## 2015-09-01 DIAGNOSIS — I10 Essential (primary) hypertension: Secondary | ICD-10-CM

## 2015-09-01 NOTE — Telephone Encounter (Signed)
Pt was given HCTZ to try for 2 weeks to make sure it worked. It did work. Would now like for 90 day supply prescription to be called in to [facility] in [place]

## 2015-09-02 MED ORDER — HYDROCHLOROTHIAZIDE 25 MG PO TABS: 25.0000 mg | ORAL_TABLET | Freq: Every day | ORAL | Status: DC

## 2015-09-02 NOTE — Telephone Encounter (Signed)
This has been sent in. He will need to return for BP recheck in the next 2 weeks.

## 2015-09-04 ENCOUNTER — Other Ambulatory Visit: Payer: Self-pay | Admitting: Family Medicine

## 2015-09-25 DIAGNOSIS — C61 Malignant neoplasm of prostate: Secondary | ICD-10-CM | POA: Diagnosis not present

## 2015-09-29 ENCOUNTER — Telehealth: Payer: Self-pay | Admitting: Family Medicine

## 2015-09-29 DIAGNOSIS — C61 Malignant neoplasm of prostate: Secondary | ICD-10-CM

## 2015-09-29 DIAGNOSIS — D049 Carcinoma in situ of skin, unspecified: Secondary | ICD-10-CM

## 2015-09-29 NOTE — Telephone Encounter (Signed)
Patient left a message that he needs a referral to [facility] and also to a dermatologist.

## 2015-10-02 DIAGNOSIS — N5201 Erectile dysfunction due to arterial insufficiency: Secondary | ICD-10-CM | POA: Diagnosis not present

## 2015-10-02 DIAGNOSIS — C61 Malignant neoplasm of prostate: Secondary | ICD-10-CM | POA: Diagnosis not present

## 2015-10-02 DIAGNOSIS — Z0000 Encounter for general adult medical examination without abnormal findings: Secondary | ICD-10-CM | POA: Diagnosis not present

## 2015-10-06 DIAGNOSIS — Z85828 Personal history of other malignant neoplasm of skin: Secondary | ICD-10-CM | POA: Diagnosis not present

## 2015-10-06 DIAGNOSIS — D1801 Hemangioma of skin and subcutaneous tissue: Secondary | ICD-10-CM | POA: Diagnosis not present

## 2015-10-06 DIAGNOSIS — L814 Other melanin hyperpigmentation: Secondary | ICD-10-CM | POA: Diagnosis not present

## 2015-10-06 DIAGNOSIS — M545 Low back pain: Secondary | ICD-10-CM | POA: Diagnosis not present

## 2015-10-06 DIAGNOSIS — D485 Neoplasm of uncertain behavior of skin: Secondary | ICD-10-CM | POA: Diagnosis not present

## 2015-10-06 DIAGNOSIS — D225 Melanocytic nevi of trunk: Secondary | ICD-10-CM | POA: Diagnosis not present

## 2015-10-06 DIAGNOSIS — L821 Other seborrheic keratosis: Secondary | ICD-10-CM | POA: Diagnosis not present

## 2015-10-06 DIAGNOSIS — M9902 Segmental and somatic dysfunction of thoracic region: Secondary | ICD-10-CM | POA: Diagnosis not present

## 2015-10-06 DIAGNOSIS — M9903 Segmental and somatic dysfunction of lumbar region: Secondary | ICD-10-CM | POA: Diagnosis not present

## 2015-10-06 DIAGNOSIS — M9905 Segmental and somatic dysfunction of pelvic region: Secondary | ICD-10-CM | POA: Diagnosis not present

## 2015-10-09 ENCOUNTER — Telehealth: Payer: Self-pay | Admitting: *Deleted

## 2015-10-09 DIAGNOSIS — M9902 Segmental and somatic dysfunction of thoracic region: Secondary | ICD-10-CM | POA: Diagnosis not present

## 2015-10-09 DIAGNOSIS — M9903 Segmental and somatic dysfunction of lumbar region: Secondary | ICD-10-CM | POA: Diagnosis not present

## 2015-10-09 DIAGNOSIS — M9905 Segmental and somatic dysfunction of pelvic region: Secondary | ICD-10-CM | POA: Diagnosis not present

## 2015-10-09 DIAGNOSIS — M545 Low back pain: Secondary | ICD-10-CM | POA: Diagnosis not present

## 2015-10-09 DIAGNOSIS — M62838 Other muscle spasm: Secondary | ICD-10-CM

## 2015-10-09 MED ORDER — CYCLOBENZAPRINE HCL 10 MG PO TABS: 10.0000 mg | ORAL_TABLET | Freq: Three times a day (TID) | ORAL | Status: DC | PRN

## 2015-10-09 NOTE — Telephone Encounter (Signed)
Will Rx trial of flexeril. If he requires any refills, he should be seen and examined.

## 2015-10-09 NOTE — Telephone Encounter (Signed)
LM on Identifiable VM informing pt of the below. Fleeger, [name]

## 2015-10-09 NOTE — Telephone Encounter (Signed)
Pt has a pinched nerve in shoulder.  He has been to the Chiropractor twice this week and "they can't get any relief"  His chiropractor suggested that Mr. Hillman call his PCP and ask for a muscle relaxer and they try again after he has been on that a few days. Fleeger, [name], CMA

## 2015-10-12 ENCOUNTER — Telehealth: Payer: Self-pay | Admitting: Family Medicine

## 2015-10-12 ENCOUNTER — Other Ambulatory Visit: Payer: Self-pay | Admitting: Sports Medicine

## 2015-10-12 DIAGNOSIS — M546 Pain in thoracic spine: Secondary | ICD-10-CM | POA: Diagnosis not present

## 2015-10-12 DIAGNOSIS — I10 Essential (primary) hypertension: Secondary | ICD-10-CM | POA: Diagnosis not present

## 2015-10-12 DIAGNOSIS — M9905 Segmental and somatic dysfunction of pelvic region: Secondary | ICD-10-CM | POA: Diagnosis not present

## 2015-10-12 DIAGNOSIS — M545 Low back pain: Secondary | ICD-10-CM | POA: Diagnosis not present

## 2015-10-12 DIAGNOSIS — M542 Cervicalgia: Secondary | ICD-10-CM

## 2015-10-12 DIAGNOSIS — M4712 Other spondylosis with myelopathy, cervical region: Secondary | ICD-10-CM | POA: Diagnosis not present

## 2015-10-12 DIAGNOSIS — M5030 Other cervical disc degeneration, unspecified cervical region: Secondary | ICD-10-CM | POA: Diagnosis not present

## 2015-10-12 DIAGNOSIS — M9902 Segmental and somatic dysfunction of thoracic region: Secondary | ICD-10-CM | POA: Diagnosis not present

## 2015-10-12 DIAGNOSIS — M9903 Segmental and somatic dysfunction of lumbar region: Secondary | ICD-10-CM | POA: Diagnosis not present

## 2015-10-12 NOTE — Telephone Encounter (Signed)
[employer] auth # [id number]. Faxing this to [employer].

## 2015-10-12 NOTE — Telephone Encounter (Signed)
[name] [name] is calling to ask for a referral to Dr. Kathrin Penner office in [place] for backpain.  They can see him at 2:00 this afternoon.  Need referral sent in then..  Have [employer] coverage

## 2015-10-13 ENCOUNTER — Ambulatory Visit
Admission: RE | Admit: 2015-10-13 | Discharge: 2015-10-13 | Disposition: A | Discharge status: Home / Self Care | Payer: Commercial Managed Care - HMO | Source: Ambulatory Visit | Attending: Sports Medicine | Admitting: Sports Medicine

## 2015-10-13 DIAGNOSIS — M542 Cervicalgia: Secondary | ICD-10-CM

## 2015-10-13 DIAGNOSIS — M5022 Other cervical disc displacement, mid-cervical region, unspecified level: Secondary | ICD-10-CM | POA: Diagnosis not present

## 2015-10-13 MED ORDER — GADOBENATE DIMEGLUMINE 529 MG/ML IV SOLN
20.0000 mL | Freq: Once | INTRAVENOUS | Status: AC | PRN
  Administered 2015-10-13: 20 mL via INTRAVENOUS

## 2015-10-19 ENCOUNTER — Telehealth: Payer: Self-pay | Admitting: Family Medicine

## 2015-10-19 NOTE — Telephone Encounter (Signed)
Pt called because his ortho doctor said that he needs a referral to see a Neuro Surgeon for his pain so that they can do surgery. Please let patient know when referral is done since he is in a lot of pain. jw

## 2015-10-20 ENCOUNTER — Emergency Department (HOSPITAL_COMMUNITY): Payer: Commercial Managed Care - HMO

## 2015-10-20 ENCOUNTER — Encounter (HOSPITAL_COMMUNITY): Payer: Self-pay | Admitting: *Deleted

## 2015-10-20 ENCOUNTER — Emergency Department (HOSPITAL_COMMUNITY)
Admission: EM | Admit: 2015-10-20 | Discharge: 2015-10-20 | Disposition: A | Discharge status: Home / Self Care | Payer: Commercial Managed Care - HMO | Attending: Emergency Medicine | Admitting: Emergency Medicine

## 2015-10-20 DIAGNOSIS — Z7982 Long term (current) use of aspirin: Secondary | ICD-10-CM | POA: Diagnosis not present

## 2015-10-20 DIAGNOSIS — M1990 Unspecified osteoarthritis, unspecified site: Secondary | ICD-10-CM | POA: Insufficient documentation

## 2015-10-20 DIAGNOSIS — L539 Erythematous condition, unspecified: Secondary | ICD-10-CM | POA: Diagnosis not present

## 2015-10-20 DIAGNOSIS — G8929 Other chronic pain: Secondary | ICD-10-CM | POA: Insufficient documentation

## 2015-10-20 DIAGNOSIS — Z87438 Personal history of other diseases of male genital organs: Secondary | ICD-10-CM | POA: Diagnosis not present

## 2015-10-20 DIAGNOSIS — E785 Hyperlipidemia, unspecified: Secondary | ICD-10-CM | POA: Insufficient documentation

## 2015-10-20 DIAGNOSIS — Z8546 Personal history of malignant neoplasm of prostate: Secondary | ICD-10-CM | POA: Diagnosis not present

## 2015-10-20 DIAGNOSIS — Z9981 Dependence on supplemental oxygen: Secondary | ICD-10-CM | POA: Diagnosis not present

## 2015-10-20 DIAGNOSIS — Z8719 Personal history of other diseases of the digestive system: Secondary | ICD-10-CM | POA: Diagnosis not present

## 2015-10-20 DIAGNOSIS — Z8673 Personal history of transient ischemic attack (TIA), and cerebral infarction without residual deficits: Secondary | ICD-10-CM | POA: Insufficient documentation

## 2015-10-20 DIAGNOSIS — I10 Essential (primary) hypertension: Secondary | ICD-10-CM | POA: Diagnosis not present

## 2015-10-20 DIAGNOSIS — Z9861 Coronary angioplasty status: Secondary | ICD-10-CM | POA: Insufficient documentation

## 2015-10-20 DIAGNOSIS — M4806 Spinal stenosis, lumbar region: Secondary | ICD-10-CM | POA: Diagnosis not present

## 2015-10-20 DIAGNOSIS — F329 Major depressive disorder, single episode, unspecified: Secondary | ICD-10-CM | POA: Diagnosis not present

## 2015-10-20 DIAGNOSIS — M549 Dorsalgia, unspecified: Secondary | ICD-10-CM

## 2015-10-20 DIAGNOSIS — R200 Anesthesia of skin: Secondary | ICD-10-CM | POA: Diagnosis not present

## 2015-10-20 DIAGNOSIS — Z79899 Other long term (current) drug therapy: Secondary | ICD-10-CM | POA: Insufficient documentation

## 2015-10-20 DIAGNOSIS — M546 Pain in thoracic spine: Secondary | ICD-10-CM | POA: Insufficient documentation

## 2015-10-20 DIAGNOSIS — Z9889 Other specified postprocedural states: Secondary | ICD-10-CM | POA: Diagnosis not present

## 2015-10-20 DIAGNOSIS — Z86018 Personal history of other benign neoplasm: Secondary | ICD-10-CM | POA: Insufficient documentation

## 2015-10-20 DIAGNOSIS — E669 Obesity, unspecified: Secondary | ICD-10-CM | POA: Diagnosis not present

## 2015-10-20 DIAGNOSIS — Z8701 Personal history of pneumonia (recurrent): Secondary | ICD-10-CM | POA: Diagnosis not present

## 2015-10-20 DIAGNOSIS — Z85828 Personal history of other malignant neoplasm of skin: Secondary | ICD-10-CM | POA: Insufficient documentation

## 2015-10-20 DIAGNOSIS — R0682 Tachypnea, not elsewhere classified: Secondary | ICD-10-CM | POA: Diagnosis not present

## 2015-10-20 DIAGNOSIS — F419 Anxiety disorder, unspecified: Secondary | ICD-10-CM | POA: Diagnosis not present

## 2015-10-20 DIAGNOSIS — R52 Pain, unspecified: Secondary | ICD-10-CM | POA: Diagnosis not present

## 2015-10-20 DIAGNOSIS — R0902 Hypoxemia: Secondary | ICD-10-CM | POA: Diagnosis not present

## 2015-10-20 DIAGNOSIS — G4733 Obstructive sleep apnea (adult) (pediatric): Secondary | ICD-10-CM | POA: Insufficient documentation

## 2015-10-20 DIAGNOSIS — R918 Other nonspecific abnormal finding of lung field: Secondary | ICD-10-CM | POA: Diagnosis not present

## 2015-10-20 LAB — CBC WITH DIFFERENTIAL/PLATELET
Basophils Absolute: 0 10*3/uL (ref 0.0–0.1)
Basophils Relative: 0 %
EOS PCT: 7 %
Eosinophils Absolute: 0.6 10*3/uL (ref 0.0–0.7)
HCT: 46 % (ref 39.0–52.0)
Hemoglobin: 14.8 g/dL (ref 13.0–17.0)
LYMPHS ABS: 1.6 10*3/uL (ref 0.7–4.0)
LYMPHS PCT: 18 %
MCH: 26.9 pg (ref 26.0–34.0)
MCHC: 32.2 g/dL (ref 30.0–36.0)
MCV: 83.6 fL (ref 78.0–100.0)
MONO ABS: 0.8 10*3/uL (ref 0.1–1.0)
MONOS PCT: 9 %
Neutro Abs: 5.9 10*3/uL (ref 1.7–7.7)
Neutrophils Relative %: 66 %
PLATELETS: 250 10*3/uL (ref 150–400)
RBC: 5.5 MIL/uL (ref 4.22–5.81)
RDW: 14.8 % (ref 11.5–15.5)
WBC: 8.9 10*3/uL (ref 4.0–10.5)

## 2015-10-20 LAB — BASIC METABOLIC PANEL
Anion gap: 11 (ref 5–15)
BUN: 24 mg/dL — AB (ref 6–20)
CHLORIDE: 104 mmol/L (ref 101–111)
CO2: 23 mmol/L (ref 22–32)
Calcium: 9.6 mg/dL (ref 8.9–10.3)
Creatinine, Ser: 1.27 mg/dL — ABNORMAL HIGH (ref 0.61–1.24)
GFR calc Af Amer: >60 mL/min (ref >60)
GFR, EST NON AFRICAN AMERICAN: 56 mL/min — AB (ref >60)
GLUCOSE: 146 mg/dL — AB (ref 65–99)
POTASSIUM: 4.5 mmol/L (ref 3.5–5.1)
Sodium: 138 mmol/L (ref 135–145)

## 2015-10-20 LAB — I-STAT TROPONIN, ED: Troponin i, poc: 0 ng/mL (ref 0.00–0.08)

## 2015-10-20 LAB — D-DIMER, QUANTITATIVE: D-Dimer, Quant: 0.27 ug/mL-FEU (ref 0.00–0.50)

## 2015-10-20 MED ORDER — HYDROMORPHONE HCL 1 MG/ML IJ SOLN
1.0000 mg | Freq: Once | INTRAMUSCULAR | Status: AC
  Administered 2015-10-20: 1 mg via INTRAVENOUS
  Filled 2015-10-20: qty 1

## 2015-10-20 MED ORDER — AZITHROMYCIN 250 MG PO TABS: 250.0000 mg | ORAL_TABLET | Freq: Every day | ORAL | Status: DC

## 2015-10-20 MED ORDER — PREGABALIN 75 MG PO CAPS: 75.0000 mg | ORAL_CAPSULE | Freq: Two times a day (BID) | ORAL | Status: DC

## 2015-10-20 NOTE — Discharge Instructions (Signed)
You may have a pneumonia on your chest xray.  Get rechecked immediately if you develop high fevers or new worrisome symptoms.     Back Pain, Adult Back pain is very common in adults.The cause of back pain is rarely dangerous and the pain often gets better over time.The cause of your back pain may not be known. Some common causes of back pain include:  Strain of the muscles or ligaments supporting the spine.  Wear and tear (degeneration) of the spinal disks.  Arthritis.  Direct injury to the back. For many people, back pain may return. Since back pain is rarely dangerous, most people can learn to manage this condition on their own. HOME CARE INSTRUCTIONS Watch your back pain for any changes. The following actions may help to lessen any discomfort you are feeling:  Remain active. It is stressful on your back to sit or stand in one place for long periods of time. Do not sit, drive, or stand in one place for more than 30 minutes at a time. Take short walks on even surfaces as soon as you are able.Try to increase the length of time you walk each day.  Exercise regularly as directed by your health care provider. Exercise helps your back heal faster. It also helps avoid future injury by keeping your muscles strong and flexible.  Do not stay in bed.Resting more than 1-2 days can delay your recovery.  Pay attention to your body when you bend and lift. The most comfortable positions are those that put less stress on your recovering back. Always use proper lifting techniques, including:  Bending your knees.  Keeping the load close to your body.  Avoiding twisting.  Find a comfortable position to sleep. Use a firm mattress and lie on your side with your knees slightly bent. If you lie on your back, put a pillow under your knees.  Avoid feeling anxious or stressed.Stress increases muscle tension and can worsen back pain.It is important to recognize when you are anxious or stressed and learn  ways to manage it, such as with exercise.  Take medicines only as directed by your health care provider. Over-the-counter medicines to reduce pain and inflammation are often the most helpful.Your health care provider may prescribe muscle relaxant drugs.These medicines help dull your pain so you can more quickly return to your normal activities and healthy exercise.  Apply ice to the injured area:  Put ice in a plastic bag.  Place a towel between your skin and the bag.  Leave the ice on for 20 minutes, 2-3 times a day for the first 2-3 days. After that, ice and heat may be alternated to reduce pain and spasms.  Maintain a healthy weight. Excess weight puts extra stress on your back and makes it difficult to maintain good posture. SEEK MEDICAL CARE IF:  You have pain that is not relieved with rest or medicine.  You have increasing pain going down into the legs or buttocks.  You have pain that does not improve in one week.  You have night pain.  You lose weight.  You have a fever or chills. SEEK IMMEDIATE MEDICAL CARE IF:   You develop new bowel or bladder control problems.  You have unusual weakness or numbness in your arms or legs.  You develop nausea or vomiting.  You develop abdominal pain.  You feel faint.   This information is not intended to replace advice given to you by your health care provider. Make sure you discuss  any questions you have with your health care provider.   Document Released: 07/18/2005 Document Revised: 08/08/2014 Document Reviewed: 11/19/2013 Elsevier Interactive Patient Education [employer].

## 2015-10-20 NOTE — ED Notes (Signed)
Pt taken to MRI  

## 2015-10-20 NOTE — Telephone Encounter (Signed)
Pt was referred to [facility] by Dr. Layne Benton, he called there to check on his referral and they "were very rude" and he would prefer not to go there if at all possible.  I advised that may be the only neurosurgery in [place], but he is not able to get to [place].  Latisia Hilaire, [name], CMA
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
## 2015-10-20 NOTE — ED Notes (Addendum)
Pt to ED by [facility] c/o severe back pain and L scapula pain Reports pain started 5 weeks ago, has followed up with [facility], had an MRI 3/13 for worsening back pain. Pt denies urinary/bowel incontinence. Has been taking oxycodone 10mg  and prednisone without improvement

## 2015-10-20 NOTE — ED Notes (Signed)
Pt back to room from xray.

## 2015-10-21 NOTE — Telephone Encounter (Signed)
Spoke with Tia and she stated that the [facility] called in again this morning and that she sent in the information for authorization. Katharina Caper, Keeghan Bialy D, [place]

## 2015-10-22 ENCOUNTER — Encounter: Payer: Self-pay | Admitting: Family Medicine

## 2015-10-22 ENCOUNTER — Ambulatory Visit (INDEPENDENT_AMBULATORY_CARE_PROVIDER_SITE_OTHER): Payer: Commercial Managed Care - HMO | Admitting: Family Medicine

## 2015-10-22 VITALS — BP 176/100 | HR 87 | Temp 97.5°F | Resp 16 | Wt 357.4 lb

## 2015-10-22 DIAGNOSIS — E669 Obesity, unspecified: Secondary | ICD-10-CM

## 2015-10-22 DIAGNOSIS — M542 Cervicalgia: Secondary | ICD-10-CM | POA: Diagnosis not present

## 2015-10-22 DIAGNOSIS — M4722 Other spondylosis with radiculopathy, cervical region: Secondary | ICD-10-CM | POA: Diagnosis not present

## 2015-10-22 DIAGNOSIS — I10 Essential (primary) hypertension: Secondary | ICD-10-CM

## 2015-10-22 DIAGNOSIS — M5412 Radiculopathy, cervical region: Secondary | ICD-10-CM | POA: Diagnosis not present

## 2015-10-22 DIAGNOSIS — Z6841 Body Mass Index (BMI) 40.0 and over, adult: Secondary | ICD-10-CM | POA: Diagnosis not present

## 2015-10-22 MED ORDER — CYCLOBENZAPRINE HCL 10 MG PO TABS: 10.0000 mg | ORAL_TABLET | Freq: Three times a day (TID) | ORAL | Status: DC | PRN

## 2015-10-22 NOTE — Patient Instructions (Signed)
I'm sorry you're feeling so poorly.   - Let me know what the neurosurgeon says. I've sent in a muscle relaxer for you to try to help with the pain in addition to your other medications.  - I will call you to schedule a nutrition visit in April.   Our clinic's number is [phone]. Feel free to call any time with questions or concerns. We will answer any questions after hours with our 24-hour emergency line at that number as well.   - Dr. Bonner Puna
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
## 2015-10-22 NOTE — Assessment & Plan Note (Addendum)
BP up today, thought most likely due to pain. Will recheck at follow up prior to making changes.  - Cough intolerance to lisinopril confirmed, noted in chart.

## 2015-10-22 NOTE — Assessment & Plan Note (Signed)
Worsening, though not focus of this visit, I have offered nutritional counseling with me and our nutritionist/dietician in April. Will call back to schedule this.
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
## 2015-10-27 DIAGNOSIS — R3121 Asymptomatic microscopic hematuria: Secondary | ICD-10-CM | POA: Diagnosis not present

## 2015-10-27 DIAGNOSIS — D485 Neoplasm of uncertain behavior of skin: Secondary | ICD-10-CM | POA: Diagnosis not present

## 2015-10-27 DIAGNOSIS — L089 Local infection of the skin and subcutaneous tissue, unspecified: Secondary | ICD-10-CM | POA: Diagnosis not present

## 2015-11-03 DIAGNOSIS — C61 Malignant neoplasm of prostate: Secondary | ICD-10-CM | POA: Diagnosis not present

## 2015-11-03 DIAGNOSIS — R3121 Asymptomatic microscopic hematuria: Secondary | ICD-10-CM | POA: Diagnosis not present

## 2015-11-03 DIAGNOSIS — D3501 Benign neoplasm of right adrenal gland: Secondary | ICD-10-CM | POA: Diagnosis not present

## 2015-11-05 DIAGNOSIS — M5030 Other cervical disc degeneration, unspecified cervical region: Secondary | ICD-10-CM | POA: Diagnosis not present

## 2015-11-05 DIAGNOSIS — M5412 Radiculopathy, cervical region: Secondary | ICD-10-CM | POA: Diagnosis not present

## 2015-11-05 DIAGNOSIS — M542 Cervicalgia: Secondary | ICD-10-CM | POA: Diagnosis not present

## 2015-11-06 ENCOUNTER — Ambulatory Visit (INDEPENDENT_AMBULATORY_CARE_PROVIDER_SITE_OTHER): Payer: Commercial Managed Care - HMO | Admitting: Psychology

## 2015-11-06 DIAGNOSIS — F32A Depression, unspecified: Secondary | ICD-10-CM

## 2015-11-06 DIAGNOSIS — F329 Major depressive disorder, single episode, unspecified: Secondary | ICD-10-CM

## 2015-11-06 NOTE — Assessment & Plan Note (Signed)
Mr. [name] [name] reports that he has experienced depressed mood and difficulty coping with grief and health-related stress. Notably, his parents and wife died within the past decade, which has caused grief and a substantial decline in available social support. As a result, patient spends most of his personal time alone. Furthermore, patient is experiencing several chronic health conditions, and is concerned that he may have cancer in his kidneys or bladder (he is undergoing testing and will receive results at the end of the month). 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 This combination of grief and life stress has resulted in depressed mood and anhedonia. Patient endorsed occasional passive SI, but denied active SI such as intent or a plan to harm himself; without prompting, he cited his children and faith as deterrents. [place] and patient discussed importance of social support and behavioral activation. Patient and [facility] listed fishing and joining a gym (e.g., the [facility]) as ways to stay active, and joining a gym may be a place to meet more people and improve social support. Patient will follow up with [facility] in two weeks.

## 2015-11-06 NOTE — Progress Notes (Signed)
Reason for follow-up: [facility] met with [name] [name] to treat depressive symptoms related to bereavement and health-related stress.  Issues discussed: Current stressors, symptoms of depression, coping strategies.  Identified goals: Patient would like to improve symptoms of depression.

## 2015-11-12 ENCOUNTER — Other Ambulatory Visit: Payer: Self-pay | Admitting: Neurosurgery

## 2015-11-12 DIAGNOSIS — M5412 Radiculopathy, cervical region: Secondary | ICD-10-CM

## 2015-11-17 ENCOUNTER — Telehealth: Payer: Self-pay | Admitting: Family Medicine

## 2015-11-17 NOTE — Telephone Encounter (Signed)
Please call Mr. Hasten regarding swelling to hands and feet.  Patient at [place] about condition

## 2015-11-17 NOTE — Telephone Encounter (Signed)
This poor guy. I spoke with him about his newest of many (severe) medical issues. It sounds bilateral, and in the setting of steroids, which rules out a significant number of issues that would require more urgent care, so I feel reassured that he can wait and will be seen tomorrow by Dr. Ardelia Mems.

## 2015-11-18 ENCOUNTER — Observation Stay (HOSPITAL_COMMUNITY)
Admission: AD | Admit: 2015-11-18 | Discharge: 2015-11-21 | Disposition: A | Discharge status: Home / Self Care | Payer: Commercial Managed Care - HMO | Source: Ambulatory Visit | Attending: Family Medicine | Admitting: Family Medicine

## 2015-11-18 ENCOUNTER — Inpatient Hospital Stay (HOSPITAL_COMMUNITY): Payer: Commercial Managed Care - HMO

## 2015-11-18 ENCOUNTER — Encounter (HOSPITAL_COMMUNITY): Payer: Self-pay | Admitting: *Deleted

## 2015-11-18 ENCOUNTER — Other Ambulatory Visit: Payer: Self-pay

## 2015-11-18 ENCOUNTER — Ambulatory Visit (INDEPENDENT_AMBULATORY_CARE_PROVIDER_SITE_OTHER): Payer: Commercial Managed Care - HMO | Admitting: Family Medicine

## 2015-11-18 ENCOUNTER — Encounter: Payer: Self-pay | Admitting: Family Medicine

## 2015-11-18 VITALS — BP 165/104 | HR 76 | Temp 97.8°F | Ht 73.0 in | Wt 362.8 lb

## 2015-11-18 DIAGNOSIS — R739 Hyperglycemia, unspecified: Secondary | ICD-10-CM | POA: Insufficient documentation

## 2015-11-18 DIAGNOSIS — I5031 Acute diastolic (congestive) heart failure: Secondary | ICD-10-CM | POA: Insufficient documentation

## 2015-11-18 DIAGNOSIS — E7800 Pure hypercholesterolemia, unspecified: Secondary | ICD-10-CM | POA: Diagnosis not present

## 2015-11-18 DIAGNOSIS — F329 Major depressive disorder, single episode, unspecified: Secondary | ICD-10-CM | POA: Insufficient documentation

## 2015-11-18 DIAGNOSIS — I10 Essential (primary) hypertension: Secondary | ICD-10-CM | POA: Insufficient documentation

## 2015-11-18 DIAGNOSIS — M25542 Pain in joints of left hand: Secondary | ICD-10-CM | POA: Diagnosis not present

## 2015-11-18 DIAGNOSIS — M5090 Cervical disc disorder, unspecified, unspecified cervical region: Secondary | ICD-10-CM | POA: Insufficient documentation

## 2015-11-18 DIAGNOSIS — R0600 Dyspnea, unspecified: Secondary | ICD-10-CM | POA: Diagnosis not present

## 2015-11-18 DIAGNOSIS — M25571 Pain in right ankle and joints of right foot: Secondary | ICD-10-CM | POA: Insufficient documentation

## 2015-11-18 DIAGNOSIS — M25572 Pain in left ankle and joints of left foot: Secondary | ICD-10-CM | POA: Insufficient documentation

## 2015-11-18 DIAGNOSIS — E669 Obesity, unspecified: Secondary | ICD-10-CM | POA: Insufficient documentation

## 2015-11-18 DIAGNOSIS — Z6841 Body Mass Index (BMI) 40.0 and over, adult: Secondary | ICD-10-CM | POA: Insufficient documentation

## 2015-11-18 DIAGNOSIS — M25522 Pain in left elbow: Secondary | ICD-10-CM | POA: Insufficient documentation

## 2015-11-18 DIAGNOSIS — K219 Gastro-esophageal reflux disease without esophagitis: Secondary | ICD-10-CM | POA: Insufficient documentation

## 2015-11-18 DIAGNOSIS — R609 Edema, unspecified: Secondary | ICD-10-CM | POA: Diagnosis present

## 2015-11-18 DIAGNOSIS — M25532 Pain in left wrist: Secondary | ICD-10-CM | POA: Insufficient documentation

## 2015-11-18 DIAGNOSIS — Z7982 Long term (current) use of aspirin: Secondary | ICD-10-CM | POA: Insufficient documentation

## 2015-11-18 DIAGNOSIS — G2581 Restless legs syndrome: Secondary | ICD-10-CM | POA: Diagnosis not present

## 2015-11-18 DIAGNOSIS — M25541 Pain in joints of right hand: Secondary | ICD-10-CM | POA: Insufficient documentation

## 2015-11-18 DIAGNOSIS — M25521 Pain in right elbow: Secondary | ICD-10-CM | POA: Insufficient documentation

## 2015-11-18 DIAGNOSIS — M25531 Pain in right wrist: Secondary | ICD-10-CM | POA: Insufficient documentation

## 2015-11-18 DIAGNOSIS — N529 Male erectile dysfunction, unspecified: Secondary | ICD-10-CM | POA: Insufficient documentation

## 2015-11-18 DIAGNOSIS — Z86718 Personal history of other venous thrombosis and embolism: Secondary | ICD-10-CM | POA: Insufficient documentation

## 2015-11-18 DIAGNOSIS — I110 Hypertensive heart disease with heart failure: Principal | ICD-10-CM | POA: Insufficient documentation

## 2015-11-18 DIAGNOSIS — R0602 Shortness of breath: Secondary | ICD-10-CM | POA: Diagnosis not present

## 2015-11-18 DIAGNOSIS — Z86010 Personal history of colonic polyps: Secondary | ICD-10-CM | POA: Insufficient documentation

## 2015-11-18 DIAGNOSIS — M4722 Other spondylosis with radiculopathy, cervical region: Secondary | ICD-10-CM | POA: Diagnosis not present

## 2015-11-18 DIAGNOSIS — G4733 Obstructive sleep apnea (adult) (pediatric): Secondary | ICD-10-CM | POA: Diagnosis not present

## 2015-11-18 DIAGNOSIS — I5030 Unspecified diastolic (congestive) heart failure: Secondary | ICD-10-CM | POA: Insufficient documentation

## 2015-11-18 DIAGNOSIS — Z8546 Personal history of malignant neoplasm of prostate: Secondary | ICD-10-CM | POA: Insufficient documentation

## 2015-11-18 DIAGNOSIS — M2550 Pain in unspecified joint: Secondary | ICD-10-CM | POA: Insufficient documentation

## 2015-11-18 DIAGNOSIS — E8770 Fluid overload, unspecified: Secondary | ICD-10-CM

## 2015-11-18 DIAGNOSIS — Z85828 Personal history of other malignant neoplasm of skin: Secondary | ICD-10-CM | POA: Insufficient documentation

## 2015-11-18 LAB — CBC WITH DIFFERENTIAL/PLATELET
BASOS ABS: 0 10*3/uL (ref 0.0–0.1)
Basophils Relative: 0 %
Eosinophils Absolute: 0.4 10*3/uL (ref 0.0–0.7)
Eosinophils Relative: 5 %
HEMATOCRIT: 43.4 % (ref 39.0–52.0)
Hemoglobin: 14.2 g/dL (ref 13.0–17.0)
LYMPHS ABS: 1.1 10*3/uL (ref 0.7–4.0)
LYMPHS PCT: 16 %
MCH: 27 pg (ref 26.0–34.0)
MCHC: 32.7 g/dL (ref 30.0–36.0)
MCV: 82.7 fL (ref 78.0–100.0)
Monocytes Absolute: 0.5 10*3/uL (ref 0.1–1.0)
Monocytes Relative: 7 %
NEUTROS ABS: 4.8 10*3/uL (ref 1.7–7.7)
NEUTROS PCT: 72 %
Platelets: 238 10*3/uL (ref 150–400)
RBC: 5.25 MIL/uL (ref 4.22–5.81)
RDW: 15.2 % (ref 11.5–15.5)
WBC: 6.8 10*3/uL (ref 4.0–10.5)

## 2015-11-18 LAB — BRAIN NATRIURETIC PEPTIDE: B NATRIURETIC PEPTIDE 5: 21.3 pg/mL (ref 0.0–100.0)

## 2015-11-18 LAB — COMPREHENSIVE METABOLIC PANEL
ALK PHOS: 53 U/L (ref 38–126)
ALT: 29 U/L (ref 17–63)
AST: 22 U/L (ref 15–41)
Albumin: 3.4 g/dL — ABNORMAL LOW (ref 3.5–5.0)
Anion gap: 11 (ref 5–15)
BUN: 16 mg/dL (ref 6–20)
CALCIUM: 8.7 mg/dL — AB (ref 8.9–10.3)
CHLORIDE: 103 mmol/L (ref 101–111)
CO2: 23 mmol/L (ref 22–32)
CREATININE: 1.12 mg/dL (ref 0.61–1.24)
GFR calc non Af Amer: >60 mL/min (ref >60)
Glucose, Bld: 214 mg/dL — ABNORMAL HIGH (ref 65–99)
Potassium: 3.6 mmol/L (ref 3.5–5.1)
Sodium: 137 mmol/L (ref 135–145)
Total Bilirubin: 0.9 mg/dL (ref 0.3–1.2)
Total Protein: 6 g/dL — ABNORMAL LOW (ref 6.5–8.1)

## 2015-11-18 LAB — SEDIMENTATION RATE: SED RATE: 5 mm/h (ref 0–16)

## 2015-11-18 LAB — TSH: TSH: 0.526 u[IU]/mL (ref 0.350–4.500)

## 2015-11-18 LAB — C-REACTIVE PROTEIN: CRP: 0.7 mg/dL (ref <1.0)

## 2015-11-18 LAB — TROPONIN I

## 2015-11-18 MED ORDER — PREGABALIN 75 MG PO CAPS
75.0000 mg | ORAL_CAPSULE | Freq: Two times a day (BID) | ORAL | Status: DC
  Administered 2015-11-18 – 2015-11-21 (×6): 75 mg via ORAL
  Filled 2015-11-18 (×6): qty 1

## 2015-11-18 MED ORDER — ENOXAPARIN SODIUM 80 MG/0.8ML ~~LOC~~ SOLN
80.0000 mg | SUBCUTANEOUS | Status: DC
  Administered 2015-11-18 – 2015-11-20 (×3): 80 mg via SUBCUTANEOUS
  Filled 2015-11-18 (×3): qty 0.8

## 2015-11-18 MED ORDER — FUROSEMIDE 10 MG/ML IJ SOLN
40.0000 mg | Freq: Once | INTRAMUSCULAR | Status: AC
  Administered 2015-11-18: 40 mg via INTRAVENOUS
  Filled 2015-11-18: qty 4

## 2015-11-18 MED ORDER — HYDRALAZINE HCL 20 MG/ML IJ SOLN
5.0000 mg | INTRAMUSCULAR | Status: DC | PRN
  Administered 2015-11-19: 5 mg via INTRAVENOUS
  Filled 2015-11-18: qty 1

## 2015-11-18 MED ORDER — BUPROPION HCL ER (SR) 150 MG PO TB12
150.0000 mg | ORAL_TABLET | Freq: Two times a day (BID) | ORAL | Status: DC
  Administered 2015-11-18 – 2015-11-21 (×6): 150 mg via ORAL
  Filled 2015-11-18 (×6): qty 1

## 2015-11-18 MED ORDER — SERTRALINE HCL 100 MG PO TABS
100.0000 mg | ORAL_TABLET | Freq: Every day | ORAL | Status: DC
  Administered 2015-11-19: 100 mg via ORAL
  Filled 2015-11-18: qty 1

## 2015-11-18 MED ORDER — SODIUM CHLORIDE 0.9% FLUSH
3.0000 mL | Freq: Two times a day (BID) | INTRAVENOUS | Status: DC
  Administered 2015-11-18 – 2015-11-20 (×5): 3 mL via INTRAVENOUS

## 2015-11-18 MED ORDER — ROSUVASTATIN CALCIUM 10 MG PO TABS
5.0000 mg | ORAL_TABLET | Freq: Every day | ORAL | Status: DC
  Administered 2015-11-19: 5 mg via ORAL
  Filled 2015-11-18: qty 1

## 2015-11-18 MED ORDER — HYDROCHLOROTHIAZIDE 25 MG PO TABS
25.0000 mg | ORAL_TABLET | Freq: Every day | ORAL | Status: DC
Start: 2015-11-19 — End: 2015-11-21
  Administered 2015-11-19 – 2015-11-21 (×3): 25 mg via ORAL
  Filled 2015-11-18 (×3): qty 1

## 2015-11-18 MED ORDER — KETOROLAC TROMETHAMINE 10 MG PO TABS
10.0000 mg | ORAL_TABLET | Freq: Four times a day (QID) | ORAL | Status: DC | PRN
  Administered 2015-11-18 – 2015-11-19 (×2): 10 mg via ORAL
  Filled 2015-11-18 (×4): qty 1

## 2015-11-18 NOTE — Progress Notes (Signed)
Date of Visit: 11/18/2015   HPI:  Patient presents for a same day appointment to discuss swelling and joint pains.  Reports having pain in his hands, ankles, and knees along with lots of swelling in his bilateral legs and hands. Swelling started mainly in the last week. Feels overall very achy. No fevers, rashes, tick bites. Eating and drinking well. Stooling and urinating normally. Endorses feeling mildly shortness of breath on exertion but denies chest pain. Swelling and joint pains have gotten him to the point where he is concerned he cannot adequately care for himself at home.  Of note he is currently being worked up for left shoulder/arm/hand pain and reports having a myelogram scheduled for next week. Has seen neurosurgery. Has taken several rounds of steroids but has not been on any in the last week.   ROS: See HPI  [place]: history of GERD, prostate cancer, OSA on CPAP, hyperlipidemia, hypertension, obesity, depression   PHYSICAL EXAM: BP 165/104 mmHg  Pulse 76  Temp(Src) 97.8 F (36.6 C) (Oral)  Ht 6\' 1"  (1.854 m)  Wt 362 lb 12.8 oz (164.565 kg)  BMI 47.88 kg/m2 Gen: NAD, pleasant, cooperative HEENT: normocephalic, atraumatic Heart: regular rate and rhythm, no murmur Lungs: clear to auscultation bilaterally, normal work of breathing, no crackles or wheezes Abdomen: soft obese Neuro: alert, grossly nonfocal, speech normal Extremities: no palpable tenderness of joints of fingers/wrists/elbows/knees. 3+ pitting edema bilateral lower extremities with some mild calf tenderness bilaterally, R>L.   ASSESSMENT/PLAN:  70 yo M presenting with one week of swelling, joint pains, and noted 40lb weight gain in the last month. Differential diagnosis includes new onset CHF (no recent cardiac evaluation), anemia, hepatic disease, renal disease, polymyalgia rheumatica. No active synovitis appreciated on exam today, and lungs are clear without signs of pulmonary edema. Warrants further  investigation with admission to the hospital. Will plan for CMET, CBC, TSH, A1c, sed rate, CXR, EKG, and echo as initial workup. Will also require physical therapy/OT consult given concerns about self care at home. Patient agreeable to this plan. Inpatient team contacted & bed requested.  FOLLOW UP: Being directly admitted to the hospital.  Delorse Limber. Ardelia Mems, [place]

## 2015-11-18 NOTE — Progress Notes (Signed)
Pt states he is supposed to wear CPAP at night but he is unable to.
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
## 2015-11-18 NOTE — Progress Notes (Signed)
11/18/15  1800  Patient has a healed wound on right calf. Pt states he had compartment syndrome and the left side was left open to heal and the right side was closed.

## 2015-11-19 ENCOUNTER — Inpatient Hospital Stay (HOSPITAL_BASED_OUTPATIENT_CLINIC_OR_DEPARTMENT_OTHER): Payer: Commercial Managed Care - HMO

## 2015-11-19 ENCOUNTER — Other Ambulatory Visit: Payer: Commercial Managed Care - HMO

## 2015-11-19 DIAGNOSIS — I5031 Acute diastolic (congestive) heart failure: Secondary | ICD-10-CM | POA: Diagnosis not present

## 2015-11-19 DIAGNOSIS — E8770 Fluid overload, unspecified: Secondary | ICD-10-CM | POA: Diagnosis not present

## 2015-11-19 DIAGNOSIS — M2550 Pain in unspecified joint: Secondary | ICD-10-CM | POA: Diagnosis not present

## 2015-11-19 DIAGNOSIS — R0600 Dyspnea, unspecified: Secondary | ICD-10-CM

## 2015-11-19 DIAGNOSIS — R609 Edema, unspecified: Secondary | ICD-10-CM | POA: Diagnosis not present

## 2015-11-19 DIAGNOSIS — K219 Gastro-esophageal reflux disease without esophagitis: Secondary | ICD-10-CM | POA: Diagnosis not present

## 2015-11-19 DIAGNOSIS — F329 Major depressive disorder, single episode, unspecified: Secondary | ICD-10-CM

## 2015-11-19 DIAGNOSIS — G4733 Obstructive sleep apnea (adult) (pediatric): Secondary | ICD-10-CM | POA: Diagnosis not present

## 2015-11-19 DIAGNOSIS — M4722 Other spondylosis with radiculopathy, cervical region: Secondary | ICD-10-CM | POA: Diagnosis not present

## 2015-11-19 DIAGNOSIS — Z8546 Personal history of malignant neoplasm of prostate: Secondary | ICD-10-CM | POA: Diagnosis not present

## 2015-11-19 DIAGNOSIS — I10 Essential (primary) hypertension: Secondary | ICD-10-CM | POA: Insufficient documentation

## 2015-11-19 DIAGNOSIS — I110 Hypertensive heart disease with heart failure: Secondary | ICD-10-CM | POA: Diagnosis not present

## 2015-11-19 DIAGNOSIS — G2581 Restless legs syndrome: Secondary | ICD-10-CM | POA: Diagnosis not present

## 2015-11-19 DIAGNOSIS — N529 Male erectile dysfunction, unspecified: Secondary | ICD-10-CM | POA: Diagnosis not present

## 2015-11-19 LAB — CBC
HEMATOCRIT: 44.2 % (ref 39.0–52.0)
HEMOGLOBIN: 14.1 g/dL (ref 13.0–17.0)
MCH: 26.6 pg (ref 26.0–34.0)
MCHC: 31.9 g/dL (ref 30.0–36.0)
MCV: 83.2 fL (ref 78.0–100.0)
Platelets: 214 10*3/uL (ref 150–400)
RBC: 5.31 MIL/uL (ref 4.22–5.81)
RDW: 15.5 % (ref 11.5–15.5)
WBC: 6.4 10*3/uL (ref 4.0–10.5)

## 2015-11-19 LAB — BASIC METABOLIC PANEL
ANION GAP: 12 (ref 5–15)
BUN: 19 mg/dL (ref 6–20)
CHLORIDE: 102 mmol/L (ref 101–111)
CO2: 25 mmol/L (ref 22–32)
Calcium: 8.7 mg/dL — ABNORMAL LOW (ref 8.9–10.3)
Creatinine, Ser: 1.31 mg/dL — ABNORMAL HIGH (ref 0.61–1.24)
GFR calc non Af Amer: 54 mL/min — ABNORMAL LOW (ref >60)
Glucose, Bld: 151 mg/dL — ABNORMAL HIGH (ref 65–99)
Potassium: 3.8 mmol/L (ref 3.5–5.1)
Sodium: 139 mmol/L (ref 135–145)

## 2015-11-19 LAB — ECHOCARDIOGRAM COMPLETE
HEIGHTINCHES: 73 in
Weight: 5652.8 oz

## 2015-11-19 LAB — TROPONIN I: Troponin I: <0.03 ng/mL (ref <0.031)

## 2015-11-19 LAB — HEMOGLOBIN A1C
Hgb A1c MFr Bld: 7.6 % — ABNORMAL HIGH (ref 4.8–5.6)
MEAN PLASMA GLUCOSE: 171 mg/dL

## 2015-11-19 MED ORDER — FUROSEMIDE 10 MG/ML IJ SOLN
40.0000 mg | Freq: Once | INTRAMUSCULAR | Status: AC
  Administered 2015-11-19: 40 mg via INTRAVENOUS
  Filled 2015-11-19: qty 4

## 2015-11-19 MED ORDER — CYCLOBENZAPRINE HCL 10 MG PO TABS
10.0000 mg | ORAL_TABLET | Freq: Three times a day (TID) | ORAL | Status: DC | PRN
  Administered 2015-11-19 – 2015-11-21 (×5): 10 mg via ORAL
  Filled 2015-11-19 (×5): qty 1

## 2015-11-19 MED ORDER — SERTRALINE HCL 50 MG PO TABS
150.0000 mg | ORAL_TABLET | Freq: Every day | ORAL | Status: DC
  Administered 2015-11-20 – 2015-11-21 (×2): 150 mg via ORAL
  Filled 2015-11-19 (×2): qty 1

## 2015-11-19 MED ORDER — HYDRALAZINE HCL 25 MG PO TABS
25.0000 mg | ORAL_TABLET | Freq: Three times a day (TID) | ORAL | Status: DC
  Administered 2015-11-19 – 2015-11-20 (×4): 25 mg via ORAL
  Filled 2015-11-19 (×4): qty 1

## 2015-11-19 MED ORDER — FUROSEMIDE 10 MG/ML IJ SOLN
80.0000 mg | Freq: Two times a day (BID) | INTRAMUSCULAR | Status: DC
  Administered 2015-11-19 – 2015-11-20 (×2): 80 mg via INTRAVENOUS
  Filled 2015-11-19 (×2): qty 8

## 2015-11-19 MED ORDER — ROSUVASTATIN CALCIUM 20 MG PO TABS
20.0000 mg | ORAL_TABLET | Freq: Every day | ORAL | Status: DC
  Administered 2015-11-20 – 2015-11-21 (×2): 20 mg via ORAL
  Filled 2015-11-19 (×2): qty 1

## 2015-11-19 MED ORDER — PANTOPRAZOLE SODIUM 20 MG PO TBEC
20.0000 mg | DELAYED_RELEASE_TABLET | Freq: Every day | ORAL | Status: DC
  Administered 2015-11-19 – 2015-11-21 (×3): 20 mg via ORAL
  Filled 2015-11-19 (×3): qty 1

## 2015-11-19 MED ORDER — HYDROCODONE-ACETAMINOPHEN 5-325 MG PO TABS
1.0000 | ORAL_TABLET | ORAL | Status: DC | PRN
  Administered 2015-11-19 – 2015-11-21 (×9): 1 via ORAL
  Filled 2015-11-19 (×10): qty 1

## 2015-11-19 NOTE — Progress Notes (Signed)
Patient refused CPAP.  Stated he was unable to wear.

## 2015-11-19 NOTE — Care Management Note (Signed)
Case Management Note  Patient Details  Name: [name] [name] MRN: [id number] Date of Birth: 01-17-1946  Subjective/Objective:      Pt admitted with volume overload               Action/Plan:  Pt is from home.  Pt has tentatively recommended SNF - depends on how pt progresses.  Pt is active with [facility] and therefore could possibly be approved by insurance with an observation stay, [facility] and CSW made aware. CSW consulted.  CM will continue to monitor for disposition needs   Expected Discharge Date:  11/20/15               Expected Discharge Plan:  Skilled Nursing Facility  In-House Referral:  Clinical Social Work  Discharge planning Services  CM Consult  Post Acute Care Choice:    Choice offered to:     DME Arranged:    DME Agency:     HH Arranged:    [place] Agency:     Status of Service:  In process, will continue to follow  Medicare Important Message Given:    Date Medicare IM Given:    Medicare IM give by:    Date Additional Medicare IM Given:    Additional Medicare Important Message give by:     If discussed at [place] of Stay Meetings, dates discussed:    Additional Comments:  Maryclare Labrador, RN 11/19/2015, 3:15 PM
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
## 2015-11-19 NOTE — Progress Notes (Signed)
  Echocardiogram 2D Echocardiogram has been performed.  Bobbye Charleston 11/19/2015, 5:26 PM

## 2015-11-19 NOTE — Progress Notes (Signed)
VASCULAR LAB PRELIMINARY  PRELIMINARY  PRELIMINARY  PRELIMINARY  Bilateral lower extremity venous duplex completed.    Preliminary report:  There is no DVT or SVT noted in the bilateral lower extremities.   Anuar Walgren, RVT 11/19/2015, 10:57 AM

## 2015-11-19 NOTE — Consult Note (Signed)
   [facility] Inpatient Consult   11/19/2015  [name] [name] 1946/04/18 [id number] Patient evaluated for community based chronic disease management services with [place] [facility] as a benefit of patient's [facility]. Spoke with patient at bedside to explain [place] Management services.  Patient admitted with HF exacerbation and Volume overload.  Patient states he lives in [place] alone.  He states that he has worked with Physical Therapy today and they may be suggesting he may need some rehab before returning home. Patient endorses Dr. Vance Gather as his primary care provider.  Consent form signed for services.  This Probation officer will monitor for disposition for appropriate post hospital follow up after discharge.   Left contact information and [facility] literature at bedside. Made Inpatient Case Manager aware that [place] Management following. Of note, [facility] Care Management services does not replace or interfere with any services that are arranged by inpatient case management or social work.  For additional questions or referrals please contact:   Natividad Brood, RN BSN [facility] Liaison  [phone] business mobile phone Toll free office [phone]

## 2015-11-19 NOTE — Progress Notes (Signed)
Family Medicine Teaching Service Daily Progress Note Intern Pager: [phone]  Patient name: [name] [name] Medical record number: [id number] Date of birth: Nov 02, 1945 Age: 70 y.o. Gender: male  Primary Care Provider: Vance Gather, MD Consultants: none Code Status: FULL  Pt Overview and Major Events to Date:  4/19 - direct admit from [facility]  Assessment and Plan: [name] [name] is a 70 y.o. male presenting with edema and dyspnea . PMH is significant for prostate CA, OSA, HLD, HTN, obesity, depression, GERD, cervical spondylosis with radiculopathy.   Edema and dyspnea: Likely related, potentially 2/2 to CHF. Trop neg. EKG with NSR and LAD. 2-3+ pitting edema to knee bilaterally on physical exam. Cr 1.1 on admission (baseline ~1). BNP 21.3 Less likely DVT or PE, as not tachycardic and D-dimer neg (0.27). Wells DVT score 1. Venous Doppler US performed yesterday, which showed no DVT. Seen by PT, who recommended SNF placement at discharge. Echo showed EF 60-65% with Grade 1 diastolic dysfunction. UOP 3L yesterday.  - Transition to PO Lasix 65m qd - Saline lock, limit IVF - PT to evaluate again today - appreciate recs   Joint pain, swelling: Differential includes polymyalgia rheumatica, fibromyalgia, arthritis. ESR, WBC, CRP WNL. Negative history of fevers, changes vision or claudication makes GCA less likely. Is [place]codone [place] for pain - Continue Norco [id number] PRN  H/o Prostate CA: History of prostate cancer s/p RA prostatectomy in 2015 for prostate cancer with noted history of recently uptrending PSA makes DVT a potential cause of LE edema however bilateral LE edema makes clot less likely. However, CA surveillance with potential for recurrence increases risk for clot. Spoke with patient's urologist ([facility]) regarding any recommendations for patient's admission. [facility], and has already had imaging/labs performed recently. No urology recommendations at this time. Venous doppler performed yesterday which showed no DVT. - F/u with outpatient urology at scheduled appt next week  Hyperglycemia: CBG 151 in ED. No noted history of DM. A1C 7.6. CBG 152 overnight.  - Continue to monitor CBG - Consider beginning metformin  HTN: Hypertensive on admission (123/86, 144/81). Cr 1.4 this AM. BP overnight 113/74, 135/85.  - Continue home HCTZ - Hydralazine PRN - Discontinue scheduled hydralazine 2051mID - Monitor BP - Begin Toprol-XL 12.5mg46m Will likely need to begin ARB in future when off Lasix and/or Cr improves - Begin ASA  HLD: - Increased home rosuvastatin to high intensity dose of 20mg57men increased ASCVD risk   Depression: [facility]. PHQ-9 score of 15.  - Continue home Wellbutrin - Increase Zoloft to 150mg 59m Continue to assess mood   Cervical spondylosis with radiculopathy:  - Continue home Lyrica for neuropathy - Continue home Flexeril - Norco 5-325mg q49mN  OSA: - CPAP with sleep  FEN/GI: heart healthy diet, SLIV Prophylaxis: Lovenox  Disposition: home pending medical improvement  Subjective:  No complaints this AM. SOB yesterday when walking but none otherwise. Joint pain has improved.   Objective: Temp:  [97.1 F (36.2 C)-98 F (36.7 C)] 97.9 F (36.6 C) (04/21 0620) Pulse Rate:  [81-89] 81 (04/21 0620) Resp:  [19-20] 20 (04/21 0620) BP: (113-144)/(74-92) 135/85 mmHg (04/21 0620) SpO2:  [94 %-96 %] 94 % (04/21 0620) Weight:  [348 lb 1.6 oz (157.897 kg)] 348 lb 1.6 oz (157.897 kg) (  04/21 0620) Physical Exam: General: resting comfortably in bed in NAD Cardiovascular: RRR, no murmurs appreciated, no JVD Respiratory: CTAB, no wheezes or rhonchi Abdomen: obese, soft, non-tender, +BS MSK: improving TTP of fingers, wrists, elbows, and knee joints  without erythema, swelling, or warmth; trace to 1+ LE pitting edema  Skin: large well-healed scar on R medial lower extremity Neuro: A&Ox3, no focal deficits Psych: appropriate mood and affect  Laboratory:  Recent Labs Lab 11/18/15 1817 11/19/15 0241  WBC 6.8 6.4  HGB 14.2 14.1  HCT 43.4 44.2  PLT 238 214    Recent Labs Lab 11/18/15 1817 11/19/15 0241 11/20/15 0256  NA 137 139 138  K 3.6 3.8 3.3*  CL 103 102 98*  CO2 _0 BUN 16 19 24*  CREATININE 1.12 1.31* 1.40*  CALCIUM 8.7* 8.7* 8.9  PROT 6.0*  --   --   BILITOT 0.9  --   --   ALKPHOS 53  --   --   ALT 29  --   --   AST 22  --   --   GLUCOSE 214* 151* 152*    Imaging/Diagnostic Tests: Dg Chest 2 View  11/18/2015  CLINICAL DATA:  Worsening chest pain and shortness of breath for 4 weeks. EXAM: CHEST  2 VIEW COMPARISON:  10/20/2015 FINDINGS: The heart size and mediastinal contours are within normal limits. Improved aeration of both lungs is seen. Mild scarring again noted in the left lung base. No evidence of pulmonary infiltrate or edema. No evidence of pneumothorax or pleural effusion. Thoracic spine degenerative changes again noted. IMPRESSION: No active cardiopulmonary disease. Electronically Signed   By: Earle Gell M.D.   On: 11/18/2015 18:27   Verner Mould, MD 11/20/2015, 12:06 PM PGY-1, [place] Intern pager: [phone], text pages welcome

## 2015-11-19 NOTE — Progress Notes (Signed)
Family Medicine Teaching Service Daily Progress Note Intern Pager: [phone]  Patient name: [name] [name] Medical record number: [id number] Date of birth: [birth year] Age: 70 y.o. Gender: male  Primary Care Provider: Vance Gather, MD Consultants: none Code Status: FULL  Pt Overview and Major Events to Date:  4/19 - direct admit from [facility]  Assessment and Plan: [name] [name] is a 70 y.o. male presenting with edema and dyspnea . PMH is significant for prostate CA, OSA, HLD, HTN, obesity, depression, GERD, cervical spondylosis with radiculopathy.   Edema and dyspnea: Likely related, potentially 2/2 to CHF. Trop neg. EKG with NSR and LAD. 2-3+ pitting edema to knee bilaterally on physical exam. Cr 1.1 on admission (baseline ~1). BNP 21.3 Less likely DVT or PE, as not tachycardic and D-dimer neg (0.27). Wells DVT score 1.  - Echo - PT/OT eval - Bilateral venous Doppler US - Repeat IV Lasix 40m once - Saline lock, limit IVF  Joint pain, swelling: Differential includes polymyalgia rheumatica, fibromyalgia, arthritis. ESR, WBC, CRP WNL. Negative history of fevers, changes vision or claudication makes GCA less likely.  - Home Flexeril and Lyrica for pain  H/o Prostate CA: History of prostate cancer s/p RA prostatectomy in 2015 for prostate cancer with noted history of recently uptrending PSA makes DVT a potential cause of LE edema however bilateral LE edema makes clot less likely. However, CA surveillance with potential for recurrence increases risk for clot. Sp[facility] patient[facility]rolog[facility] regarding any recommendations for patient's admission. [facility] and has already had imaging/labs performed recently. No urology recommendations at this time.  - Will obtain venous dopplers as above given CA history  Hyperglycemia: CBG 151 in ED. No noted history of DM. A1C 7.6.   - Continue to monitor CBG - Consider beginning  metformin  HTN: Hypertensive on admission (123/86, 144/81). Cr 1.31 this AM.  - Continue home HCTZ - Hydralazine PRN - Begin scheduled hydralazine 225mBID - Monitor BP  HLD: - Continue home Crestor  Depression: - Continue home Wellbutrin - Increase Zoloft to 1505md - Continue to assess mood  - Will perform PHQ-9  Cervical spondylosis with radiculopathy:  - Continue home Lyrica for neuropathy - Continue home Flexeril - Norco 5-325m19m PRN  OSA: - CPAP with sleep  FEN/GI: heart healthy diet, SLIV Prophylaxis: Lovenox  Disposition: home pending medical improvement  Subjective:  Patient reporting continued joint pain today. Denies dyspnea. Feels that his edema is unchanged. Denies chest pain.   Objective: Temp:  [97.4 F (36.3 C)-98.1 F (36.7 C)] 97.8 F (36.6 C) (04/20 0600) Pulse Rate:  [67-99] 99 (04/20 1119) Resp:  [18-24] 18 (04/20 1119) BP: (123-185)/(81-116) 160/99 mmHg (04/20 1119) SpO2:  [94 %-100 %] 100 % (04/20 1119) Weight:  [353 lb 4.8 oz (160.256 kg)-357 lb 4.8 oz (162.07 kg)] 353 lb 4.8 oz (160.256 kg) (04/20 0600) Physical Exam: General: resting comfortably in bed in NAD Cardiovascular: RRR, no murmurs appreciated, no JVD Respiratory: CTAB, no wheezes or rhonchi Abdomen: obese, soft, non-tender, +BS MSK: TTP of fingers, wrists, elbows, and knee joints without erythema, swelling, or warmth; 2-3+ pitting edema to knee bilaterally Skin: large well-healed scar on R medial lower extremity Neuro: A&Ox3, no focal deficits Psych: appropriate mood and affect  Laboratory:  Recent Labs Lab 11/18/15 1817 11/19/15 0241  WBC 6.8 6.4  HGB 14.2 14.1  HCT 43.4 44.2  PLT 238 214  Recent Labs Lab 11/18/15 1817 11/19/15 0241  NA 137 139  K 3.6 3.8  CL 103 102  CO2 23 25  BUN 16 19  CREATININE 1.12 1.31*  CALCIUM 8.7* 8.7*  PROT 6.0*  --   BILITOT 0.9  --   ALKPHOS 53  --   ALT 29  --   AST 22  --   GLUCOSE 214* 151*     Imaging/Diagnostic Tests: Dg Chest 2 View  11/18/2015  CLINICAL DATA:  Worsening chest pain and shortness of breath for 4 weeks. EXAM: CHEST  2 VIEW COMPARISON:  10/20/2015 FINDINGS: The heart size and mediastinal contours are within normal limits. Improved aeration of both lungs is seen. Mild scarring again noted in the left lung base. No evidence of pulmonary infiltrate or edema. No evidence of pneumothorax or pleural effusion. Thoracic spine degenerative changes again noted. IMPRESSION: No active cardiopulmonary disease. Electronically Signed   By: Earle Gell M.D.   On: 11/18/2015 18:27    Verner Mould, MD 11/19/2015, 12:46 PM PGY-1, [place] Intern pager: [phone], text pages welcome

## 2015-11-19 NOTE — Progress Notes (Signed)
OT Cancellation Note  Patient Details Name: OSSAMA NORTHAM MRN: [id number] DOB: 26-Aug-1945   Cancelled Treatment:    Reason Eval/Treat Not Completed: Patient at procedure or test/ unavailable (ECHO). Will check back as schedule allows for OT eval, possibly tomorrow. Thank you for the order.   Chrys Racer , MS, OTR/L, CLT Pager: [phone]  11/19/2015, 3:11 PM

## 2015-11-19 NOTE — Evaluation (Signed)
Physical Therapy Evaluation Patient Details Name: [name] [name] MRN: [id number] DOB: [birth year] Today's Date: 11/19/2015   History of Present Illness  [name] [name] is a 70 y.o. male presenting with edema and dyspnea . PMH is significant for prostate CA, OSA, HLD, HTN, obesity, depression, GERD, cervical spondylosis with radiculopathy.   Clinical Impression  Pt admitted with above diagnosis. Pt currently with functional limitations due to the deficits listed below (see PT Problem List). Pt was able to ambulate however at end of walk DOE 3/4 with BP elevated 185/103.  Nursing brought pt meds.  Son and daughter in law present and concerned about pts ability to go home alone.  Discussed options for pt to d/c to SNF to gain strength and endurance if pt desires and pt is thinking about that.  If he progresses and does not need SNF, recommend HHPT, HHOT and transition to Cardiac or pulmonary rehab if MD agrees.  Pt has had falls in past and reports he lost his mom, dad and wife in last 87 years and that life has been difficult.   Pt will benefit from skilled PT to increase their independence and safety with mobility to allow discharge to the venue listed below.      Follow Up Recommendations SNF (If pt progresses, HHPT with transition to Cardiac Rehab)    Equipment Recommendations  3in1 (PT)    Recommendations for Other Services OT consult     Precautions / Restrictions Precautions Precautions: Fall Restrictions Weight Bearing Restrictions: No      Mobility  Bed Mobility Overal bed mobility: Independent             General bed mobility comments: Took incr time for pt to put on his socks.  Had more difficulty with the right LE.  Pt with DOE 2/4 with putting on socks.  O2 sat was 94% on RA.   Transfers Overall transfer level: Needs assistance Equipment used: None Transfers: Sit to/from Stand Sit to Stand: Min guard         General transfer comment: No LOB with static  activity.   Ambulation/Gait Ambulation/Gait assistance: Min guard Ambulation Distance (Feet): 275 Feet Assistive device: None Gait Pattern/deviations: Step-through pattern;Decreased stride length;Steppage;Antalgic;Drifts right/left;Wide base of support   Gait velocity interpretation: Below normal speed for age/gender General Gait Details: Pt ambulated with unsteady gait at times drifting to right at times.  Pt able to self correct balance.  Pt became diaphoretic during ambulation with BP on return 185/103.  Nursing made aware and brought pt meds.  Pt DOE 3/4 at end of walk.  Pt with greater LOB with challenges but still able to self correct.    Stairs Stairs: Yes Stairs assistance: Supervision Stair Management: No rails;Step to pattern;Forwards Number of Stairs: 4 General stair comments: Pt was able to ascend and descend steps without issues.    Wheelchair Mobility    Modified Rankin (Stroke Patients Only)       Balance Overall balance assessment: Needs assistance;History of Falls Sitting-balance support: No upper extremity supported;Feet supported Sitting balance-Leahy Scale: Fair     Standing balance support: No upper extremity supported;During functional activity Standing balance-Leahy Scale: Fair Standing balance comment: Stands statically without assist and with supervision only.                  Standardized Balance Assessment Standardized Balance Assessment : Dynamic Gait Index   Dynamic Gait Index Level Surface: Normal Change in Gait Speed: Normal Gait with Horizontal Head Turns:  Mild Impairment Gait with Vertical Head Turns: Mild Impairment Gait and Pivot Turn: Normal Step Over Obstacle: Normal Step Around Obstacles: Normal Steps: Moderate Impairment Total Score: 20       Pertinent Vitals/Pain Pain Assessment: 0-10 Pain Score: 7  Pain Location: left shoulder and neck Pain Descriptors / Indicators: Aching;Sore Pain Intervention(s): Limited  activity within patient's tolerance;Monitored during session;Premedicated before session;Repositioned  BP 185/103.  DOE 3/4.  O2 sats on RA 94%.     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  Feel that pt is at risk only with high level balance activities with one legged activities due to pt with right LE issues.   Gave pt a handout on [employer] as well.  Will discuss at next visist further as tech came to take pt to procedure.      Exercises        Assessment/Plan    PT Assessment Patient needs continued PT services  PT Diagnosis Generalized weakness;Acute pain   PT Problem List Decreased activity tolerance;Decreased balance;Decreased mobility;Decreased knowledge of use of DME;Decreased safety awareness;Decreased knowledge of precautions;Pain  PT Treatment Interventions DME instruction;Gait training;Functional mobility  training;Therapeutic activities;Therapeutic exercise;Balance training;Patient/family education   PT Goals (Current goals can be found in the Care Plan section) Acute Rehab PT Goals Patient Stated Goal: to get better PT Goal Formulation: With patient/family Time For Goal Achievement: 12/03/15 Potential to Achieve Goals: Good    Frequency Min 3X/week   Barriers to discharge Decreased caregiver support      Co-evaluation               End of Session Equipment Utilized During Treatment: Gait belt Activity Tolerance: Patient limited by fatigue Patient left: with call bell/phone within reach;in bed;with family/visitor present Nurse Communication: Mobility status         Time: [id number] PT Time Calculation (min) (ACUTE ONLY): 30 min   Charges:   PT Evaluation $PT Eval Moderate Complexity: 1 Procedure PT Treatments $Gait Training: 8-22 mins   PT G CodesIrwin Brakeman [name] 03-Dec-2015, 11:07 AM Jenesys Casseus,PT Acute Rehabilitation [phone] (pager)

## 2015-11-19 NOTE — Clinical Social Work Placement (Signed)
   CLINICAL SOCIAL WORK PLACEMENT  NOTE  Date:  11/19/2015  Patient Details  Name: [name] [name] MRN: [id number] Date of Birth: 1946-05-13  Clinical Social Work is seeking post-discharge placement for this patient at the [place] level of care (*CSW will initial, date and re-position this form in  chart as items are completed):  Yes   Patient/family provided with [place] Work Department's list of facilities offering this level of care within the geographic area requested by the patient (or if unable, by the patient's family).  Yes   Patient/family informed of their freedom to choose among providers that offer the needed level of care, that participate in Medicare, Medicaid or managed care program needed by the patient, have an available bed and are willing to accept the patient.  Yes   Patient/family informed of [facility]'s ownership interest in [facility] and [facility], as well as of the fact that they are under no obligation to receive care at these facilities.  PASRR submitted to EDS on       PASRR number received on       Existing PASRR number confirmed on       FL2 transmitted to all facilities in geographic area requested by pt/family on 11/19/15     FL2 transmitted to all facilities within larger geographic area on       Patient informed that his/her managed care company has contracts with or will negotiate with certain facilities, including the following:            Patient/family informed of bed offers received.  Patient chooses bed at       Physician recommends and patient chooses bed at      Patient to be transferred to   on  .  Patient to be transferred to facility by       Patient family notified on   of transfer.  Name of family member notified:        PHYSICIAN Please sign FL2, Please prepare priority discharge summary, including medications     Additional Comment:     _______________________________________________ Leane Call, Student-SW 11/19/2015, 3:36 PM

## 2015-11-20 ENCOUNTER — Ambulatory Visit: Payer: Commercial Managed Care - HMO | Admitting: Psychology

## 2015-11-20 ENCOUNTER — Ambulatory Visit: Payer: Commercial Managed Care - HMO

## 2015-11-20 DIAGNOSIS — I5031 Acute diastolic (congestive) heart failure: Secondary | ICD-10-CM | POA: Diagnosis not present

## 2015-11-20 DIAGNOSIS — M4722 Other spondylosis with radiculopathy, cervical region: Secondary | ICD-10-CM | POA: Diagnosis not present

## 2015-11-20 DIAGNOSIS — I5030 Unspecified diastolic (congestive) heart failure: Secondary | ICD-10-CM | POA: Insufficient documentation

## 2015-11-20 DIAGNOSIS — G4733 Obstructive sleep apnea (adult) (pediatric): Secondary | ICD-10-CM | POA: Diagnosis not present

## 2015-11-20 DIAGNOSIS — E8770 Fluid overload, unspecified: Secondary | ICD-10-CM | POA: Diagnosis not present

## 2015-11-20 DIAGNOSIS — I10 Essential (primary) hypertension: Secondary | ICD-10-CM | POA: Diagnosis not present

## 2015-11-20 DIAGNOSIS — M2550 Pain in unspecified joint: Secondary | ICD-10-CM | POA: Diagnosis not present

## 2015-11-20 DIAGNOSIS — R0600 Dyspnea, unspecified: Secondary | ICD-10-CM | POA: Diagnosis not present

## 2015-11-20 DIAGNOSIS — I110 Hypertensive heart disease with heart failure: Secondary | ICD-10-CM | POA: Diagnosis not present

## 2015-11-20 DIAGNOSIS — G2581 Restless legs syndrome: Secondary | ICD-10-CM | POA: Diagnosis not present

## 2015-11-20 DIAGNOSIS — F329 Major depressive disorder, single episode, unspecified: Secondary | ICD-10-CM | POA: Diagnosis not present

## 2015-11-20 DIAGNOSIS — N529 Male erectile dysfunction, unspecified: Secondary | ICD-10-CM | POA: Diagnosis not present

## 2015-11-20 DIAGNOSIS — K219 Gastro-esophageal reflux disease without esophagitis: Secondary | ICD-10-CM | POA: Diagnosis not present

## 2015-11-20 DIAGNOSIS — Z8546 Personal history of malignant neoplasm of prostate: Secondary | ICD-10-CM | POA: Diagnosis not present

## 2015-11-20 LAB — BASIC METABOLIC PANEL
ANION GAP: 13 (ref 5–15)
BUN: 24 mg/dL — ABNORMAL HIGH (ref 6–20)
CHLORIDE: 98 mmol/L — AB (ref 101–111)
CO2: 27 mmol/L (ref 22–32)
Calcium: 8.9 mg/dL (ref 8.9–10.3)
Creatinine, Ser: 1.4 mg/dL — ABNORMAL HIGH (ref 0.61–1.24)
GFR calc Af Amer: 58 mL/min — ABNORMAL LOW (ref >60)
GFR calc non Af Amer: 50 mL/min — ABNORMAL LOW (ref >60)
GLUCOSE: 152 mg/dL — AB (ref 65–99)
POTASSIUM: 3.3 mmol/L — AB (ref 3.5–5.1)
Sodium: 138 mmol/L (ref 135–145)

## 2015-11-20 MED ORDER — FUROSEMIDE 40 MG PO TABS
40.0000 mg | ORAL_TABLET | Freq: Every day | ORAL | Status: DC
  Administered 2015-11-20 – 2015-11-21 (×2): 40 mg via ORAL
  Filled 2015-11-20 (×2): qty 1

## 2015-11-20 MED ORDER — METOPROLOL SUCCINATE ER 25 MG PO TB24
12.5000 mg | ORAL_TABLET | Freq: Every day | ORAL | Status: DC
  Administered 2015-11-21: 12.5 mg via ORAL
  Filled 2015-11-20: qty 1

## 2015-11-20 NOTE — Care Management Note (Addendum)
Case Management Note  Patient Details  Name: [name] [name] MRN: [id number] Date of Birth: 1945/10/01  Subjective/Objective:      Pt admitted with volume overload               Action/Plan:  Pt is from home.  Pt has tentatively recommended SNF - depends on how pt progresses.  Pt is active with [facility] and therefore could possibly be approved by insurance with an observation stay, [facility] and CSW made aware. CSW consulted.  CM will continue to monitor for disposition needs   Expected Discharge Date:  11/20/15               Expected Discharge Plan:  Skilled Nursing Facility  In-House Referral:  Clinical Social Work  Discharge planning Services  CM Consult  Post Acute Care Choice:    Choice offered to:     DME Arranged:    DME Agency:     HH Arranged:    [place] Agency:     Status of Service:  In process, will continue to follow  Medicare Important Message Given:    Date Medicare IM Given:    Medicare IM give by:    Date Additional Medicare IM Given:    Additional Medicare Important Message give by:     If discussed at [name] of Stay Meetings, dates discussed:    Additional Comments: 11/20/2015  Post second therapy eval; recommendation is for [facility] and DME -  CM requested attending to write HH/DME/CM consult orders as deemed neccessary prior to discharge  Discussed pt during progression rounds with bedside nurse and CSW ; pt is min assist and walked more than 270 feet independently per bedside nurse.  CSW and CM will continue to follow pt for appropriate discharge needs Maryclare Labrador, RN 11/20/2015, 9:07 AM

## 2015-11-20 NOTE — Evaluation (Signed)
Occupational Therapy Evaluation Patient Details Name: [name] [name] MRN: [id number] DOB: Dec 25, 1945 Today's Date: 11/20/2015    History of Present Illness [name] [name] is a 70 y.o. male presenting with edema and dyspnea . PMH is significant for prostate CA, OSA, HLD, HTN, obesity, depression, GERD, cervical spondylosis with radiculopathy.    Clinical Impression   Pt was assessed by OT and currently demonstrates deficits in his ability to don/doff shoes and socks. He has previous nerve compression LUE and is being followed by a out-pt MD for possible surgery to address "a pinched nerve in my arm/neck", Pt has been mod I grooming, toileting and standing at sink for brief amounts of time since his admission to the hospital. He plans to now d/c home and should benefit from [facility] for home safety assessment and a/e recommendations w/ intermittent supervision from family. Handout issed & reviewed for a/e.    Follow Up Recommendations  Home health OT;Supervision - Intermittent;Other (comment) (HHOT for home safety assessment and a/e, DME recommendations)    Equipment Recommendations  Other (comment) (Long handled a/e for reacher and sock aide. )    Recommendations for Other Services       Precautions / Restrictions Precautions Precautions: Fall Restrictions Weight Bearing Restrictions: No      Mobility Bed Mobility Overal bed mobility: Independent                Transfers Overall transfer level: Modified independent Equipment used: None Transfers: Sit to/from [facility] Sit to Stand: Modified independent (Device/Increase time) Stand pivot transfers: Modified independent (Device/Increase time)       General transfer comment: No LOB during transfers or functional mobillity in room    Balance Overall balance assessment: History of Falls Sitting-balance support: No upper extremity supported;Feet supported Sitting balance-Leahy Scale: Good      Standing balance support: No upper extremity supported;During functional activity Standing balance-Leahy Scale: Good                              ADL Overall ADL's : Needs assistance/impaired Eating/Feeding: Independent;Sitting   Grooming: Modified independent;Standing   Upper Body Bathing: Modified independent;Sitting;Standing   Lower Body Bathing: Min guard;Sit to/from stand   Upper Body Dressing : Independent;Sitting   Lower Body Dressing: Minimal assistance;Sit to/from stand Lower Body Dressing Details (indicate cue type and reason): Min A for don/doff socks. Pt reports that he has difficulty w/ socks and shoes at home. He should benefit from a/e instruction. Toilet Transfer: Copywriter, advertising;Modified Independent   Toileting- Clothing Manipulation and Hygiene: Independent;Sitting/lateral lean;Sit to/from stand   Tub/ Banker: Walk-in shower;Modified independent;Ambulation (Has walk in shower at home - mod I simulated transfer. Pt may benefit from shower seat.)   Functional mobility during ADLs: Independent General ADL Comments: Pt was assessed by OT and currently demonstrates deficits in his ability to don/doff shoes and socks. He has previous nerve compression LUE and is being followed by a out-pt MD for possible surgery to address "a pinched nerve in my arm/neck", Pt has been mod I grooming, toileting and standing at sink for brief amounts of time since his admission to the hospital. He plans to now d/c home and should benefit from [facility] for home safety assessment and a/e recommendations and intermittent supervision per family. Pt was agreeable to this POC as discussed. Handout issed & reviewed for a/e.     Vision  No change from baseline   Perception  Praxis      Pertinent Vitals/Pain Pain Assessment: 0-10 Pain Score: 4  Pain Location: Left shoulder "I have 2 pinched nerves" Pain Descriptors / Indicators: Aching;Sore Pain  Intervention(s): Limited activity within patient's tolerance;Monitored during session;Premedicated before session;Repositioned     Hand Dominance Right   Extremity/Trunk Assessment Upper Extremity Assessment Upper Extremity Assessment: LUE deficits/detail;Generalized weakness LUE Deficits / Details: h/o pinched nerve x2 LUE, has been followed by MD and reports that he's had multiple courses of treatment. He is to f/u w/ MD for possible surgery per pt report today. Reports of paresthesias along ulnar nurve in left hand. Generalized weakness noted upon gross MMT during assessment. and decreased grip (3/5) LUE: Unable to fully assess due to pain LUE Coordination: decreased gross motor;decreased fine motor   Lower Extremity Assessment Lower Extremity Assessment: Defer to PT evaluation   Cervical / Trunk Assessment Cervical / Trunk Assessment: Normal   Communication Communication Communication: No difficulties   Cognition Arousal/Alertness: Awake/alert Behavior During Therapy: WFL for tasks assessed/performed Overall Cognitive Status: Within Functional Limits for tasks assessed                     General Comments       Exercises       Shoulder Instructions      Home Living Family/patient expects to be discharged to:: Private residence Living Arrangements: Alone Available Help at Discharge: Family;Available PRN/intermittently Type of Home: House Home Access: Stairs to enter;Ramped entrance Entrance Stairs-Number of Steps: Ramp in front and 2 STE back Entrance Stairs-Rails: None Home Layout: One level     Bathroom Shower/Tub: Walk-in shower;Door   Bathroom Toilet: Handicapped height Bathroom Accessibility: Yes   Home Equipment: Environmental consultant - 2 wheels;Cane - quad;Crutches;Adaptive equipment Adaptive Equipment: Reacher Additional Comments: had a right LE injury in past      Prior Functioning/Environment Level of Independence: Independent        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 [place]    OT Diagnosis: Generalized weakness   OT Problem List: Decreased activity tolerance;Impaired UE functional use;Obesity;Decreased knowledge of use of DME or AE;Other (comment) (Generalized weakness)   OT Treatment/Interventions: Self-care/ADL training;Therapeutic activities;DME and/or AE instruction;Patient/family education    OT Goals(Current goals can be found in the care plan section) Acute Rehab OT Goals Patient Stated Goal: I'd rather go home & have therapy come there OT Goal Formulation: With patient Time For Goal Achievement: 12/04/15 Potential to Achieve Goals: Good ADL Goals Pt Will Perform Lower Body Bathing: Independently;with adaptive equipment;sit to/from stand Pt Will Perform Lower Body Dressing: Independently;with adaptive equipment;sitting/lateral leans;sit to/from stand Pt Will Perform Tub/Shower Transfer: Shower transfer;with modified independence;ambulating;shower seat  OT Frequency: Min 2X/week   Barriers to D/C:            Co-evaluation              End of Session Equipment Utilized During Treatment: Gait belt  Activity Tolerance: Patient tolerated treatment well Patient left: in bed;with call bell/phone within reach   Time: 1041-1059 OT Time Calculation (min): 18 min Charges:  OT General Charges $OT Visit: 1 Procedure OT Evaluation $OT Eval Low Complexity: 1 Procedure G-Codes: OT G-codes **NOT FOR INPATIENT CLASS** Functional Assessment Tool Used: Clinical judgement Functional Limitation: Self care Self Care Current Status [id number]): At least 20 percent but less than 40 percent impaired, limited or restricted Self Care Goal Status [id number]): At least 1 percent but less than 20 percent impaired, limited or restricted  [place], [employer]  [place], OTR/L 11/20/2015, 11:20 AM

## 2015-11-20 NOTE — Progress Notes (Signed)
Physical Therapy Treatment Patient Details Name: [name] [name] MRN: [id number] DOB: Apr 24, 1946 Today's Date: 11/20/2015    History of Present Illness [name] [name] is a 70 y.o. male presenting with edema and dyspnea . PMH is significant for prostate CA, OSA, HLD, HTN, obesity, depression, GERD, cervical spondylosis with radiculopathy.     PT Comments    Pt independently ambulated 300' without loss of balance, no assistive device, HR 100, 2/4 dyspnea at end of walk. He has met PT goals and is ready to DC home from PT standpoint. Outpatient Cardiac Rehab recommended. No further PT needs as pt is independent with mobility, PT signing off.    Follow Up Recommendations   ( Cardiac Rehab)     Equipment Recommendations  3in1 (PT)    Recommendations for Other Services OT consult     Precautions / Restrictions Precautions Precautions: Fall Precaution Comments: 2 falls in past year Restrictions Weight Bearing Restrictions: No    Mobility  Bed Mobility Overal bed mobility: Independent                Transfers Overall transfer level: Modified independent Equipment used: None Transfers: Sit to/from [facility] Sit to Stand: Independent Stand pivot transfers: Modified independent (Device/Increase time)       General transfer comment: No LOB during transfers or functional mobillity in room  Ambulation/Gait Ambulation/Gait assistance: Independent Ambulation Distance (Feet): 300 Feet Assistive device: None Gait Pattern/deviations: Wide base of support;WFL(Within Functional Limits)   Gait velocity interpretation: at or above normal speed for age/gender General Gait Details: steady, no LOB, HR 101 with ambulation, 2/4 dyspnea at end of walk   Stairs            Wheelchair Mobility    Modified Rankin (Stroke Patients Only)       Balance Overall balance assessment: History of Falls (pt reports 2 falls 2* dizziness in past year, both were  following a quick change in position, pt states he now doesn't have dizziness with position changes nor with head turns) Sitting-balance support: No upper extremity supported;Feet supported Sitting balance-Leahy Scale: Good     Standing balance support: No upper extremity supported;During functional activity Standing balance-Leahy Scale: Good                      Cognition Arousal/Alertness: Awake/alert Behavior During Therapy: WFL for tasks assessed/performed Overall Cognitive Status: Within Functional Limits for tasks assessed                      Exercises      General Comments        Pertinent Vitals/Pain Pain Score: 9  Pain Location: L shoulder due to "pinched nerves" Pain Descriptors / Indicators: Sore Pain Intervention(s): Monitored during session;Premedicated before session    Home Living                      Prior Function            PT Goals (current goals can now be found in the care plan section) Acute Rehab PT Goals Patient Stated Goal: to go fishing on his pontoon boat PT Goal Formulation: All assessment and education complete, DC therapy Progress towards PT goals: Goals met/education completed, patient discharged from PT    Frequency  Min 3X/week    PT Plan Discharge plan needs to be updated    Co-evaluation  End of Session Equipment Utilized During Treatment: Gait belt Activity Tolerance: Patient tolerated treatment well Patient left: with call bell/phone within reach;in bed     Time: 5726-2035 PT Time Calculation (min) (ACUTE ONLY): 10 min  Charges:  $Gait Training: 8-22 mins                    G Codes:      [name] 11/20/2015, 2:51 PM [phone]

## 2015-11-20 NOTE — Progress Notes (Signed)
Patient refused CPAP at this time. Patient states he is unable to wear.

## 2015-11-21 DIAGNOSIS — N529 Male erectile dysfunction, unspecified: Secondary | ICD-10-CM | POA: Diagnosis not present

## 2015-11-21 DIAGNOSIS — Z8546 Personal history of malignant neoplasm of prostate: Secondary | ICD-10-CM | POA: Diagnosis not present

## 2015-11-21 DIAGNOSIS — M2550 Pain in unspecified joint: Secondary | ICD-10-CM | POA: Diagnosis not present

## 2015-11-21 DIAGNOSIS — G2581 Restless legs syndrome: Secondary | ICD-10-CM | POA: Diagnosis not present

## 2015-11-21 DIAGNOSIS — R0600 Dyspnea, unspecified: Secondary | ICD-10-CM | POA: Diagnosis not present

## 2015-11-21 DIAGNOSIS — I110 Hypertensive heart disease with heart failure: Secondary | ICD-10-CM | POA: Diagnosis not present

## 2015-11-21 DIAGNOSIS — G4733 Obstructive sleep apnea (adult) (pediatric): Secondary | ICD-10-CM | POA: Diagnosis not present

## 2015-11-21 DIAGNOSIS — I5031 Acute diastolic (congestive) heart failure: Secondary | ICD-10-CM | POA: Diagnosis not present

## 2015-11-21 DIAGNOSIS — M5090 Cervical disc disorder, unspecified, unspecified cervical region: Secondary | ICD-10-CM | POA: Insufficient documentation

## 2015-11-21 DIAGNOSIS — E8770 Fluid overload, unspecified: Secondary | ICD-10-CM | POA: Diagnosis not present

## 2015-11-21 DIAGNOSIS — K219 Gastro-esophageal reflux disease without esophagitis: Secondary | ICD-10-CM | POA: Diagnosis not present

## 2015-11-21 DIAGNOSIS — M4722 Other spondylosis with radiculopathy, cervical region: Secondary | ICD-10-CM | POA: Diagnosis not present

## 2015-11-21 DIAGNOSIS — I10 Essential (primary) hypertension: Secondary | ICD-10-CM | POA: Diagnosis not present

## 2015-11-21 DIAGNOSIS — F329 Major depressive disorder, single episode, unspecified: Secondary | ICD-10-CM | POA: Diagnosis not present

## 2015-11-21 LAB — CBC
HCT: 46.4 % (ref 39.0–52.0)
Hemoglobin: 15.3 g/dL (ref 13.0–17.0)
MCH: 27.4 pg (ref 26.0–34.0)
MCHC: 33 g/dL (ref 30.0–36.0)
MCV: 83 fL (ref 78.0–100.0)
PLATELETS: 255 10*3/uL (ref 150–400)
RBC: 5.59 MIL/uL (ref 4.22–5.81)
RDW: 15.4 % (ref 11.5–15.5)
WBC: 7.2 10*3/uL (ref 4.0–10.5)

## 2015-11-21 LAB — BASIC METABOLIC PANEL
Anion gap: 14 (ref 5–15)
BUN: 26 mg/dL — AB (ref 6–20)
CALCIUM: 9.1 mg/dL (ref 8.9–10.3)
CHLORIDE: 95 mmol/L — AB (ref 101–111)
CO2: 25 mmol/L (ref 22–32)
CREATININE: 1.4 mg/dL — AB (ref 0.61–1.24)
GFR calc Af Amer: 58 mL/min — ABNORMAL LOW (ref >60)
GFR calc non Af Amer: 50 mL/min — ABNORMAL LOW (ref >60)
Glucose, Bld: 193 mg/dL — ABNORMAL HIGH (ref 65–99)
Potassium: 3.6 mmol/L (ref 3.5–5.1)
SODIUM: 134 mmol/L — AB (ref 135–145)

## 2015-11-21 MED ORDER — SERTRALINE HCL 50 MG PO TABS: 150.0000 mg | ORAL_TABLET | Freq: Every day | ORAL | Status: DC

## 2015-11-21 MED ORDER — ASPIRIN 325 MG PO TABS
325.0000 mg | ORAL_TABLET | Freq: Every day | ORAL | Status: DC
  Administered 2015-11-21: 325 mg via ORAL
  Filled 2015-11-21: qty 1

## 2015-11-21 MED ORDER — FUROSEMIDE 40 MG PO TABS: 40.0000 mg | ORAL_TABLET | Freq: Every day | ORAL | Status: DC

## 2015-11-21 MED ORDER — METOPROLOL SUCCINATE ER 25 MG PO TB24: 12.5000 mg | ORAL_TABLET | Freq: Every day | ORAL | Status: DC

## 2015-11-21 NOTE — Discharge Instructions (Signed)
You were admitted with swelling in your arms and legs. This got better with the fluid pills. You should continue taking these after you go home. It is very important for you to follow up with your regular doctor at the [facility].

## 2015-11-21 NOTE — Progress Notes (Signed)
Family Medicine Teaching Service Daily Progress Note Intern Pager: [phone]  Patient name: [name] [name] Medical record number: [id number] Date of birth: [birth year] Age: 70 y.o. Gender: male  Primary Care Provider: Vance Gather, MD Consultants: none Code Status: FULL  Pt Overview and Major Events to Date:  4/19 - direct admit from [facility]  Assessment and Plan: IBAN UTZ is a 70 y.o. male presenting with edema and dyspnea . PMH is significant for prostate CA, OSA, HLD, HTN, obesity, depression, GERD, cervical spondylosis with radiculopathy.   Edema and dyspnea / HFpEF: G1DD on echo. Likely related, potentially 2/2 to CHF. Trop neg.  Cr 1.1 on admission (baseline ~1).  Improving with diuresis.  - Transition to PO Lasix 14m qd - Saline lock, limit IVF  Joint pain, swelling: ESR, WBC, CRP WNL. No fevers. Pain likely due to volume overload. Is prescribed oxycodone outpatient by [facility] Lyrica for pain - Continue Norco [id number] PRN  H/o Prostate CA: History of prostate cancer s/p RA prostatectomy in 2015 for prostate cancer with noted history of recently uptrending PSA makes DVT a potential cause of LE edema however bilateral LE edema makes clot less likely. However, CA surveillance with potential for recurrence increases risk for clot. Spoke with patient's [facility]rologist [facility] regarding any recommendations for patient's admission. [facility], and has already had imaging/labs performed recently. No urology recommendations at this time. Venous doppler performed yesterday which showed no DVT. - F/u with outpatient urology at scheduled appt next week  Hyperglycemia: CBG 151 in ED. No noted history of DM. A1C 7.6.  - Continue to monitor CBG - Consider beginning metformin  HTN: Hypertensive on admission (123/86, 144/81).  - Continue home HCTZ - Hydralazine PRN - Discontinue scheduled  hydralazine 2015mID - Begin Toprol-XL 12.5mg67m Will likely need to begin ARB in future when off Lasix and/or Cr improves - Begin ASA  HLD: - Increased home rosuvastatin to high intensity dose of 20mg18men increased ASCVD risk   Depression: [facility]. PHQ-9 score of 15.  - Continue home Wellbutrin - Increase Zoloft to 150mg 29m Continue to assess mood   Cervical spondylosis with radiculopathy:  - Continue home Lyrica for neuropathy - Continue home Flexeril - Norco 5-325mg q57mN  OSA: - CPAP with sleep  FEN/GI: heart healthy diet, SLIV Prophylaxis: Lovenox  Disposition: home pending medical improvement  Subjective:  No complaints this morning. No chest pain. Anticipating discharge home later today.   Objective: Temp:  [97.8 F (36.6 C)-98.2 F (36.8 C)] 98 F (36.7 C) (04/22 0557) Pulse Rate:  [82-92] 82 (04/22 0557) Resp:  [18-20] 18 (04/22 0557) BP: (116-136)/(54-87) 116/84 mmHg (04/22 0557) SpO2:  [96 %-97 %] 96 % (04/22 0557) Weight:  [345 lb 6.4 oz (156.672 kg)] 345 lb 6.4 oz (156.672 kg) (04/22 0551) Physical Exam: General: resting comfortably in bed in NAD Cardiovascular: RRR, no murmurs appreciated, no JVD Respiratory: CTAB, no wheezes or rhonchi Abdomen: obese, soft, non-tender, +BS MSK: improving TTP of fingers, wrists, elbows, and knee joints without erythema, swelling, or warmth; trace to 1+ LE pitting edema  Skin: large well-healed scar on R medial lower extremity Neuro: A&Ox3, no focal deficits Psych: appropriate mood and affect  Laboratory:  Recent Labs Lab 11/18/15 1817 11/19/15 0241  WBC 6.8 6.4  HGB 14.2 14.1  HCT 43.4 44.2  PLT 238 214    Recent Labs Lab 11/18/15 1817 11/19/15 0241 11/20/15 0256  NA 137 139 138  K 3.6 3.8 3.3*  CL 103 102 98*  CO2 _0 BUN 16 19 24*  CREATININE 1.12 1.31* 1.40*  CALCIUM 8.7* 8.7* 8.9  PROT 6.0*  --   --   BILITOT 0.9  --    --   ALKPHOS 53  --   --   ALT 29  --   --   AST 22  --   --   GLUCOSE 214* 151* 152*    Imaging/Diagnostic Tests: None new  Vivi Barrack, MD 11/21/2015, 8:41 AM PGY-2, [place] Intern pager: [phone], text pages welcome

## 2015-11-21 NOTE — Progress Notes (Signed)
Patient is discharge to home accompanied by patient's familu members and NT via wheelchair. Discharge instructions given . Patient verbalizes understanding. All personal belongings given. Telemetry box and IV removed prior to discharge and site in good condition.

## 2015-11-21 NOTE — Progress Notes (Signed)
Occupational Therapy Treatment Patient Details Name: [name] [name] MRN: [id number] DOB: May 09, 1946 Today's Date: 11/21/2015    History of present illness [name] [name] is a 70 y.o. male presenting with edema and dyspnea . PMH is significant for prostate CA, OSA, HLD, HTN, obesity, depression, GERD, cervical spondylosis with radiculopathy.    OT comments  Provided demonstration and reviewed benefits of use of A/E for LB ADLS.  Pt. Able to return demo and states he will "look into getting some of this stuff".  Also reviewed energy conservation techniques during ADL completion.  Pt. Verbalized understanding.  Will continue to follow acutely.    Follow Up Recommendations  Home health OT;Supervision - Intermittent;Other (comment)    Equipment Recommendations       Recommendations for Other Services      Precautions / Restrictions Precautions Precautions: Fall       Mobility Bed Mobility                  Transfers                      Balance                                   ADL Overall ADL's : Needs assistance/impaired             Lower Body Bathing: With adaptive equipment;Min guard Lower Body Bathing Details (indicate cue type and reason): reviewed use of LH sponge     Lower Body Dressing: With adaptive equipment;Set up Lower Body Dressing Details (indicate cue type and reason): pt. able to return demo of use of A/E for use of sock-aide, LH shoe horn, and reacher (pt. returned demonstration of socks and pants)               General ADL Comments: discussed energy conservation and benefits of A/E for fall prevention      Vision                     Perception     Praxis      Cognition   Behavior During Therapy: [facility] for tasks assessed/performed Overall Cognitive Status: Within Functional Limits for tasks assessed                       Extremity/Trunk Assessment               Exercises      Shoulder Instructions       General Comments      Pertinent Vitals/ Pain       Pain Assessment: No/denies pain  Home Living                                          Prior Functioning/Environment              Frequency Min 2X/week     Progress Toward Goals  OT Goals(current goals can now be found in the care plan section)  Progress towards OT goals: Progressing toward goals     Plan Discharge plan remains appropriate    Co-evaluation                 End of Session Equipment Utilized During Treatment: Other (comment) (a/e)   Activity  Tolerance Patient tolerated treatment well   Patient Left in chair;with call bell/phone within reach   Nurse Communication          Time: [id number] OT Time Calculation (min): 12 min  Charges: OT General Charges $OT Visit: 1 Procedure OT Treatments $Self Care/Home Management : 8-22 mins  Janice Coffin, COTA/L 11/21/2015, 12:00 PM

## 2015-11-23 ENCOUNTER — Other Ambulatory Visit: Payer: Self-pay | Admitting: Family Medicine

## 2015-11-23 ENCOUNTER — Encounter: Payer: Self-pay | Admitting: *Deleted

## 2015-11-23 ENCOUNTER — Other Ambulatory Visit: Payer: Self-pay | Admitting: *Deleted

## 2015-11-23 DIAGNOSIS — E119 Type 2 diabetes mellitus without complications: Secondary | ICD-10-CM | POA: Insufficient documentation

## 2015-11-23 NOTE — Assessment & Plan Note (Signed)
Based on Hb A1c 7.6% during recent admission. Not yet on medications. Will follow up for start of metformin and initial discussion about diabetes at next office visit.  - Refer for medical nutrition therapy: [facility] prefers [facility] in [place], who is accepting new patients.

## 2015-11-23 NOTE — Patient Outreach (Addendum)
[facility]) Care Management Transition of Care Outreach/Week #1  11/23/2015  RAYMON GATEWOOD 08/18/1945 [id number]   Mr. SABINO EVELYN is a 70 year old gentleman who lives alone in [place], [place]. 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 Mr. Reddinger was primary caregiver for his wife during her battle with cancer and who passed away one year ago. Mr. Griesel admits that he had become depressed and feels this led to his other health problems. He independently sought treatment for his depression and is taking provider prescribed medication and is seeing a therapist. He was taking Wellbutrin prior to his hospitalization and Zoloft was added at hospital discharge.  Mr. Belmont presented to the hospital on 11/18/15 after a week of swelling, joint pains, and a 40lb weight gain over the course of the previous month. He was admitted, evaluated, and treated for volume overload/acute diastolic heart failure and diuresed. Mr. Kusnierz is scheduled to see his primary care provider tomorrow 11/24/15. I reviewed medications with Mr. Roupe today and he is taking all prescribed medications except Lyrica. I have contacted his primary care provider regarding this.   Mr. Kapple reports a weight of 344# today, within 1 pound of discharge weight. He denies edema, shortness of breath, or chest pain but reports continued profound fatigue. I reviewed with Mr. Phou instructions to weigh daily each morning and report weight gain of 3# overnight or 5# in a week OR new or worsened symptoms.   Mr. Metzinger was anxious to learn more about his diagnosis and treatment plan.I have prepared [employer] and spoke with Mr. Rodela at length about his diagnosis and treatment plan. He eagerly agreed to a face to face visit in his home next week for initiation of chronic disease management/disease management/care coordination with [facility].    Plan: I will see Mr. Sear at home next week for initial home visit.   [facility] CM Care Plan Problem One        Most Recent Value   Care Plan Problem One  Knowledge Deficit related to Acute Health Condition/New Diagnosis (CHF)   Role Documenting the Problem One  Care Management Coordinator   Care Plan for Problem One  Active   THN Long Term Goal (31-90 days)  Over the next 60 days, patient will verbalize understanding of plan of care for management of CHF   THN Long Term Goal Start Date  11/23/15   Interventions for Problem One Long Term Goal  Utilizing teachback method, reviewed with patient the importance of knowledge of diagnosis and established plan of care   [facility] CM Short Term Goal #1 (0-30 days)  Over the next 24 hours, patient will attend scheduled provider follow up appointment   [facility] CM Short Term Goal #1 Start Date  11/23/15   Interventions for Short Term Goal #1  Reveiwed scheduled provider appointment and discussed questions to ask at appointment   [facility] CM Short Term Goal #2 (0-30 days)  Over the next 30 days, patient will verbalize understanding of signs and symptoms of CHF   [facility] CM Short Term Goal #2 Start Date  11/23/15   Interventions for Short Term Goal #2  Utilizing teachback method, reviewed with patient signs and symptoms of CHF and prepared Emmi educational materials for patient to be mailed and reviewed on home visit   [facility] CM Short Term Goal #3 (0-30 days)  Over the next 30 days, patient will weigh daily and record  and verbalize understanding of when to call provider for weight gain or other new/worsening symptom   [facility] CM Short Term Goal #3 Start Date  11/23/15   Interventions for Short Tern Goal #3  Utiilizing teachback method, reviewed with patient rationale for daily weight and calling for symptoms,  prepared Emmi educational materials to mail and will f/u withface to face education next week    [facility] CM Care Plan Problem Two        Most Recent Value   Care Plan Problem  Two  Knowledge Deficits related to medications   Role Documenting the Problem Two  Care Management [place] for Problem Two  Active   Interventions for Problem Two Long Term Goal   Medication Review completed,  Utiziling teachback method, reviewed with patient importance of understanding medications   THN Long Term Goal (31-90) days  Over the next 60 days, patient will verbalize basic understanding of all prescribed medications   THN Long Term Goal Start Date  11/23/15   [facility] CM Short Term Goal #1 (0-30 days)  Over the next 30 days, patient will be able to correctly identify which medications are used for treatment ofCHF   [facility] CM Short Term Goal #1 Start Date  11/23/15   Interventions for Short Term Goal #2   Utilizing teachback method, reviewed medications used to treat and manage heart disease, particularly CHF    [facility] CM Care Plan Problem Three        Most Recent Value   Care Plan Problem Three  Care Coordination Needs related to Chronic Health Condition (Depression)   Role Documenting the Problem Three  Care Management Coordinator   Care Plan for Problem Three  Active   THN Long Term Goal (31-90) days  Over the next 31 days, patient will verbalize understanding of long term plan for management of depression   THN Long Term Goal Start Date  11/23/15   Interventions for Problem Three Long Term Goal  utilizing teachback method, reviewed with patient the importance of establishing long term plan for treatment of depression   [facility] CM Short Term Goal #1 (0-30 days)  Over the next 30 days, patient will discuss mental health needs with [facility] LCSW   [facility] CM Short Term Goal #1 Start Date  11/23/15   Interventions for Short Term Goal #1  LCSW referral made   [facility] CM Short Term Goal #2 (0-30 days)  Over the next 30 days, patient will establish with community mental health provider   [facility] CM Short Term Goal #2 Start Date  11/23/15   Interventions for Short Term Goal #2  Discussed with patient  the importance of establishing routine mental health care plan      [facility]  [phone]

## 2015-11-24 ENCOUNTER — Other Ambulatory Visit: Payer: Self-pay | Admitting: *Deleted

## 2015-11-24 DIAGNOSIS — R3121 Asymptomatic microscopic hematuria: Secondary | ICD-10-CM | POA: Diagnosis not present

## 2015-11-24 DIAGNOSIS — Z0000 Encounter for general adult medical examination without abnormal findings: Secondary | ICD-10-CM | POA: Diagnosis not present

## 2015-11-24 NOTE — Telephone Encounter (Signed)
[facility] Case Manager left voice message on nurse line stating she spoke with patient yesterday.  He stated he does not a prescription for Lyrica.  She also forward her notes to PCP.  Please give her a call with questions at [phone].  Derl Barrow, RN

## 2015-11-24 NOTE — Progress Notes (Signed)
Late entry for missed gcode 11/19/15:   2015-12-09 1000  PT G-Codes **NOT FOR INPATIENT CLASS**  Functional Assessment Tool Used clinical judgment  Functional Limitation Mobility: Walking and moving around  Mobility: Walking and Moving Around Current Status [phone]) CI  Mobility: Walking and Moving Around Goal Status [phone]) CI  [facility] [phone] (pager)

## 2015-11-24 NOTE — Patient Outreach (Signed)
[facility]) Care Management  11/24/2015  VOLVY SALLER 1946/01/23 [id number]  I reached out to Mr. Hanby this afternoon in to follow up on his primary care post hospital follow up visit with Dr. Bonner Puna today. He reports having had a good visit and said he felt encouraged about moving forward. He told me that he was contacted by Jearld Fenton RD, CDE regarding a new patient appointment on 12/07/15 for initiation of new diabetes and nutrition management instruction.   Mr. Cdebaca did not receive a prescription or any counsel regarding his Lyrica prescription. He was prescribed Lyrica at discharge (Gabapentin was discontinued) but did not have a prescription for this and was not aware that he was to be taking this new medication. I left a voice message on the provider/pharmacy line at Dr. Bonner Puna' office requesting either a return call or for this to be addressed with Mr. Kennell at today's provider appointment.   Plan: I will call Dr. Bonner Puna' office first thing tomorrow morning to follow up on Lyrica.    [facility]  [phone]

## 2015-11-25 MED ORDER — PREGABALIN 75 MG PO CAPS: 75.0000 mg | ORAL_CAPSULE | Freq: Two times a day (BID) | ORAL | Status: DC

## 2015-11-25 NOTE — Telephone Encounter (Signed)
LVM  Asking pt to call the office. If he calls, please tell him that his Rx is up front for pick up. Ottis Stain, CMA

## 2015-11-26 ENCOUNTER — Other Ambulatory Visit: Payer: Self-pay | Admitting: *Deleted

## 2015-11-26 NOTE — Patient Outreach (Signed)
[facility]) Care Management  11/26/2015  TARAN LOVEDAY 1946/04/14 [id number]  Medication Management follow up - noted prescription provided by primary care office for Mr. Drost's pickup. I was unable to reach him by phone but have an appointment with him on Tuesday.   Plan: Detailed med review on Tuesday, Dec 04 2015.    [place] [facility]  [phone]

## 2015-11-30 ENCOUNTER — Ambulatory Visit (INDEPENDENT_AMBULATORY_CARE_PROVIDER_SITE_OTHER): Payer: Commercial Managed Care - HMO | Admitting: Psychology

## 2015-11-30 DIAGNOSIS — F331 Major depressive disorder, recurrent, moderate: Secondary | ICD-10-CM | POA: Diagnosis not present

## 2015-11-30 NOTE — Telephone Encounter (Signed)
Spoke to pt. He will come tomorrow and pick up the Rx. Ottis Stain, CMA

## 2015-12-01 ENCOUNTER — Other Ambulatory Visit: Payer: Self-pay | Admitting: *Deleted

## 2015-12-01 ENCOUNTER — Encounter: Payer: Self-pay | Admitting: *Deleted

## 2015-12-01 NOTE — Patient Outreach (Signed)
[facility]) Care Management   12/01/2015  AADAN ZEMPEL Jun 17, 1946 [id number]  ALPHEUS MERRIMAN is an 70 y.o. gentleman who lives alone in [place], [place]. 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 Mr. Greig was primary caregiver for his wife during her battle with cancer and who passed away one year ago. Mr. Kolin admits that he had become depressed and feels this led to his other health problems. He independently sought treatment for his depression and is taking provider prescribed medication and is seeing a therapist. He was taking Wellbutrin prior to his hospitalization and Zoloft was added at hospital discharge.  Mr. Fahrer presented to the hospital on 11/18/15 after a week of swelling, joint pains, and a 40lb weight gain over the course of the previous month. He was admitted, evaluated, and treated for volume overload/acute diastolic heart failure and diuresed.   I am seeing Mr. Sunderlin at home today for our initial home visit.  Subjective: "I think I'm getting on the right track. I have a long way to go though."  Objective:  BP 140/92 mmHg  Pulse 89  Wt 339 lb (153.769 kg)  SpO2 97%  Review of Systems  Constitutional: Positive for weight loss.       Intentional weight loss; eliminating snacks; working on adherence to prescribed low sodium, carb modified diet  Eyes: Negative.   Respiratory: Negative for cough, sputum production, shortness of breath and wheezing.   Cardiovascular: Negative for chest pain, palpitations and leg swelling.  Gastrointestinal: Negative.   Genitourinary: Negative.   Musculoskeletal: Positive for neck pain. Negative for falls.  Skin: Negative.   Neurological: Negative.   Psychiatric/Behavioral: Negative.        In treatment for depression; says today "I feel like I'm on the right track"    Physical Exam  Constitutional: He is oriented to person, place, and time. He appears well-developed and  well-nourished. He is active.  Neck: No JVD present.  Cardiovascular: Normal rate and regular rhythm.   Respiratory: Effort normal and breath sounds normal. No respiratory distress. He has no wheezes. He has no rhonchi. He has no rales.  GI: Soft. Bowel sounds are normal.  Neurological: He is alert and oriented to person, place, and time.  Skin: Skin is warm, dry and intact.  Psychiatric: He has a normal mood and affect. His speech is normal and behavior is normal. Judgment and thought content normal. Cognition and memory are normal.    Encounter Medications:   Outpatient Encounter Prescriptions as of 12/01/2015  Medication Sig Note  . aspirin EC 325 MG tablet Take 1 tablet (325 mg total) by mouth daily.   [name] buPROPion (WELLBUTRIN SR) 150 MG 12 hr tablet TAKE ONE TABLET BY MOUTH TWICE DAILY. 11/19/2015: Last filled #60 on 08/07/15, unsure if pt is still taking this  . cyclobenzaprine (FLEXERIL) 10 MG tablet Take 1 tablet (10 mg total) by mouth 3 (three) times daily as needed for muscle spasms.   . furosemide (LASIX) 40 MG tablet Take 1 tablet (40 mg total) by mouth daily.   . hydrochlorothiazide (HYDRODIURIL) 25 MG tablet Take 1 tablet (25 mg total) by mouth daily.   . metoprolol succinate (TOPROL-XL) 25 MG 24 hr tablet Take 0.5 tablets (12.5 mg total) by mouth daily.   . pregabalin (LYRICA) 75 MG capsule Take 1 capsule (75 mg total) by mouth 2 (two) times daily.   . rosuvastatin (CRESTOR) 5 MG tablet TAKE ONE TABLET BY  MOUTH ONCE DAILY.   [name] sertraline (ZOLOFT) 50 MG tablet Take 3 tablets (150 mg total) by mouth daily.    No facility-administered encounter medications on file as of 12/01/2015.    Functional Status:   In your present state of health, do you have any difficulty performing the following activities: 11/23/2015 11/18/2015  Hearing? N N  Vision? N N  Difficulty concentrating or making decisions? Y N  Walking or climbing stairs? Y N  Dressing or bathing? N N  Doing errands, shopping?  Y N  Preparing Food and eating ? N -  Using the Toilet? N -  In the past six months, have you accidently leaked urine? N -  Do you have problems with loss of bowel control? N -  Managing your Medications? N -  Managing your Finances? N -  Housekeeping or managing your Housekeeping? Y -    Fall/Depression Screening:    PHQ 2/9 Scores 11/23/2015 11/18/2015 11/06/2015 08/19/2015 04/27/2015 08/15/2014 07/15/2014  PHQ - 2 Score 4 0 5 0 5 0 0  PHQ- 9 Score 15 - 15 - - - -    Assessment:  Mr. Gillin is a 70 year old gentleman recently discharged from the hospital with a new diagnosis of CHF.   New/Chronic Health Condition (CHF) - Mr. Barrish has knowledge deficits related to his new diagnosis and is very anxious to learn. He is weighing himself daily and recording. I have provided documentation tools and print educational materials related to his new diagnosis. We reviewed his medications in detail, paying particular attention to which medications most affect his cardiovascular health.   New/Chronic Health Condition (DM) - Mr. Kneisley did not know until this recent hospitalization that his HgA1C was elevated (7.6/5.6 1 year ago). We reviewed this finding today and what it means. We discussed the difference between point of care cbg or venous sample glucose readings and HgA1C as well as HgA1C and point of care goals. Mr. Fleurimond is scheduled to see Jearld Fenton RD, CDE for education and help with self health management.   Chronic Health Condition (depression) - Mr. Stilts is seeing a Social worker and taking medications as prescribed; he says he feels "on the right track"  Chronic Health condition (neck pain) - Mr. Bork has chronic neck pain and numbness in his right 4th (ring) and pinky (5th) fingers. He is scheduled for myelogram and CT tomorrow. He sees neurology for this condition and is to follow up with his neurologist after his examinations tomorrow.   Plan:   Mr. Stupak will  weigh daily and record, calling his cardiologist for weight gain of 3# overnight or 5# in a week.   Mr. Mencias will monitor his CHF symptoms daily and call for increased shortness of breath, increase in swelling, or any other symptom that is new or worsened.   Mr. Mccrery will follow up with Ms. Jearld Fenton RD, CDE as scheduled.   Mr. Anuszewski will arrive for his radiographic workup tomorrow.   I will see Mr. Pantaleon at home again next month for routine home visit and he will call with any questions in the interim.    [facility] CM Care Plan Problem One        Most Recent Value   Care Plan Problem One  Knowledge Deficit related to Acute Health Condition/New Diagnosis (CHF)   Role Documenting the Problem One  Care Management Coordinator   Care Plan for Problem One  Active   THN Long Term Goal (31-90  days)  Over the next 60 days, patient will verbalize understanding of plan of care for management of CHF   THN Long Term Goal Start Date  11/23/15   Interventions for Problem One Long Term Goal  Utilizing teachback method, reviewed with patient the importance of knowledge of diagnosis and established plan of care   [facility] CM Short Term Goal #1 (0-30 days)  Over the next 24 hours, patient will attend scheduled provider follow up appointment   [facility] CM Short Term Goal #1 Start Date  11/23/15   Interventions for Short Term Goal #1  Reveiwed scheduled provider appointment and discussed questions to ask at appointment   [facility] CM Short Term Goal #2 (0-30 days)  Over the next 30 days, patient will verbalize understanding of signs and symptoms of CHF   [facility]N CM Short Term Goal #2 Start Date  11/23/15   Interventions for Short Term Goal #2  Utilizing teachback method, reviewed with patient signs and symptoms of CHF and prepared Emmi educational materials for patient to be mailed and reviewed on home visit   [facility] CM Short Term Goal #3 (0-30 days)  Over the next 30 days, patient will weigh daily and record and  verbalize understanding of when to call provider for weight gain or other new/worsening symptom   [facility] CM Short Term Goal #3 Start Date  11/23/15   Interventions for Short Tern Goal #3  Utiilizing teachback method, reviewed with patient rationale for daily weight and calling for symptoms,  prepared Emmi educational materials to mail and will f/u withface to face education next week    [facility] CM Care Plan Problem Two        Most Recent Value   Care Plan Problem Two  Knowledge Deficits related to medications   Role Documenting the Problem Two  Care Management Coordinator   Care Plan for Problem Two  Active   Interventions for Problem Two Long Term Goal   Medication Review completed,  Utiziling teachback method, reviewed with patient importance of understanding medications   THN Long Term Goal (31-90) days  Over the next 60 days, patient will verbalize basic understanding of all prescribed medications   THN Long Term Goal Start Date  11/23/15   THN CM Short Term Goal #1 (0-30 days)  Over the next 30 days, patient will be able to correctly identify which medications are used for treatment ofCHF   [facility] CM Short Term Goal #1 Start Date  11/23/15   Interventions for Short Term Goal #2   Utilizing teachback method, reviewed medications used to treat and manage heart disease, particularly CHF    [facility] CM Care Plan Problem Three        Most Recent Value   Care Plan Problem Three  Care Coordination Needs related to Chronic Health Condition (Depression)   Role Documenting the Problem Three  Care Management Coordinator   Care Plan for Problem Three  Active   THN Long Term Goal (31-90) days  Over the next 31 days, patient will verbalize understanding of long term plan for management of depression   THN Long Term Goal Start Date  11/23/15   Interventions for Problem Three Long Term Goal  utilizing teachback method, reviewed with patient the importance of establishing long term plan for treatment of depression    [facility] CM Short Term Goal #1 (0-30 days)  Over the next 30 days, patient will discuss mental health needs with THN LCSW   [facility]N CM Short Term Goal #  1 Start Date  11/23/15   Interventions for Short Term Goal #1  LCSW referral made   [facility]N CM Short Term Goal #2 (0-30 days)  Over the next 30 days, patient will establish with community mental health provider   [facility] CM Short Term Goal #2 Start Date  11/23/15   Interventions for Short Term Goal #2  Discussed with patient the importance of establishing routine mental health care plan     [place] Management Coordinator Direct Dial:  [phone]  Fax: [phone]  [address], [place], [place] [address] Website:  [email or web address]

## 2015-12-02 ENCOUNTER — Ambulatory Visit
Admission: RE | Admit: 2015-12-02 | Discharge: 2015-12-02 | Disposition: A | Discharge status: Home / Self Care | Payer: 59 | Source: Ambulatory Visit | Attending: Neurosurgery | Admitting: Neurosurgery

## 2015-12-02 VITALS — BP 163/95 | HR 85

## 2015-12-02 DIAGNOSIS — M4722 Other spondylosis with radiculopathy, cervical region: Secondary | ICD-10-CM

## 2015-12-02 DIAGNOSIS — M50322 Other cervical disc degeneration at C5-C6 level: Secondary | ICD-10-CM | POA: Diagnosis not present

## 2015-12-02 DIAGNOSIS — M5412 Radiculopathy, cervical region: Secondary | ICD-10-CM

## 2015-12-02 DIAGNOSIS — M5090 Cervical disc disorder, unspecified, unspecified cervical region: Secondary | ICD-10-CM

## 2015-12-02 MED ORDER — IOPAMIDOL (ISOVUE-300) INJECTION 61%
10.0000 mL | Freq: Once | INTRAVENOUS | Status: AC | PRN
  Administered 2015-12-02: 10 mL

## 2015-12-02 MED ORDER — DIAZEPAM 5 MG PO TABS
10.0000 mg | ORAL_TABLET | Freq: Once | ORAL | Status: AC
  Administered 2015-12-02: 5 mg via ORAL

## 2015-12-02 MED ORDER — IOPAMIDOL (ISOVUE-M 200) INJECTION 41%: 20.0000 mL | Freq: Once | INTRAMUSCULAR | Status: DC

## 2015-12-02 NOTE — Discharge Instructions (Signed)
Myelogram Discharge Instructions  1. Go home and rest quietly for the next 24 hours.  It is important to lie flat for the next 24 hours.  Get up only to go to the restroom.  You may lie in the bed or on a couch on your back, your stomach, your left side or your right side.  You may have one pillow under your head.  You may have pillows between your knees while you are on your side or under your knees while you are on your back.  2. DO NOT drive today.  Recline the seat as far back as it will go, while still wearing your seat belt, on the way home.  3. You may get up to go to the bathroom as needed.  You may sit up for 10 minutes to eat.  You may resume your normal diet and medications unless otherwise indicated.  Drink lots of extra fluids today and tomorrow.  4. The incidence of headache, nausea, or vomiting is about 5% (one in 20 patients).  If you develop a headache, lie flat and drink plenty of fluids until the headache goes away.  Caffeinated beverages may be helpful.  If you develop severe nausea and vomiting or a headache that does not go away with flat bed rest, call [phone].  5. You may resume normal activities after your 24 hours of bed rest is over; however, do not exert yourself strongly or do any heavy lifting tomorrow. If when you get up you have a headache when standing, go back to bed and force fluids for another 24 hours.  6. Call your physician for a follow-up appointment.  The results of your myelogram will be sent directly to your physician by the following day.  7. If you have any questions or if complications develop after you arrive home, please call [phone].  Discharge instructions have been explained to the patient.  The patient, or the person responsible for the patient, fully understands these instructions.      May resume Wellbutrin and Zoloft on Dec 03, 2015, after 9:30 am.

## 2015-12-02 NOTE — Progress Notes (Signed)
Patient states he has been off Wellbutrin and Zoloft for the past two days.

## 2015-12-07 ENCOUNTER — Encounter: Payer: Commercial Managed Care - HMO | Attending: Family Medicine | Admitting: Nutrition

## 2015-12-07 ENCOUNTER — Encounter: Payer: Self-pay | Admitting: Nutrition

## 2015-12-07 DIAGNOSIS — E119 Type 2 diabetes mellitus without complications: Secondary | ICD-10-CM | POA: Diagnosis not present

## 2015-12-07 DIAGNOSIS — E1165 Type 2 diabetes mellitus with hyperglycemia: Secondary | ICD-10-CM

## 2015-12-07 DIAGNOSIS — E118 Type 2 diabetes mellitus with unspecified complications: Secondary | ICD-10-CM

## 2015-12-07 DIAGNOSIS — IMO0002 Reserved for concepts with insufficient information to code with codable children: Secondary | ICD-10-CM

## 2015-12-07 NOTE — Progress Notes (Signed)
  Medical Nutrition Therapy:  Appt start time: [id number] end time:  1700.  Assessment:  Primary concerns today: Obesity, DM Type 2 and CHF.  Lives by himself. He is a Multimedia programmer. Eats three meals per day. Has a lot of stress in his life. Lost his wife 4 years ago. UBW-  320 lbs 6 months ago and now 350 lbs.[name] [name] PMH: Depression. 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 Physical actvity has been limited due to pinch nerve. Meets with surgeon to talk about pinch nerves on Friday. A1C 7.6% but has been on a lot of steroids from neck and back issues. Is not testing blood sugars. Doesn't 'have a meter. Is not on any meds for Type 2 DM. He is weighing daily. Needs guidance of how much water to consume but avoid fluid overload. Will defer to Lars Mage [facility] nurse.  Eats most meals at home. Wants to start going to [facility] or [facility] for exercises. Diet is inconsistent to meet his nutritional needs. Needs weight loss and avoiding salty and processed foods.  Preferred Learning Style:   No preference indicated   Learning Readiness:     Ready  Change in progress   MEDICATIONS: See list   DIETARY INTAKE:    24-hr recall:  B ( AM): 2 eggs, sausage and biscuit, 2 cups of coffee and water Snk ( AM): none  L ( PM): pork chop 2 thin ones on dinner roll, water Snk ( PM): gram crackers, water D ( PM):  2 chicken strips-microwave breaded.  water Snk ( PM): none occiassionally cookies. Beverages: water  Usual physical activity:   Estimated energy needs: 1800 calories 200 g carbohydrates 135 g protein 50 g fat  Progress Towards Goal(s):  In progress.   Nutritional Diagnosis:  NB-1.1 Food and nutrition-related knowledge deficit As related to Diabetes, CHF and obesity.  As evidenced by A1C 7.6%, BMI > 40 and .    Intervention:  Nutrition and Diabetes education provided on My Plate, CHO counting, meal planning, portion sizes, timing of meals, avoiding snacks  between meals unless having a low blood sugar, target ranges for A1C and blood sugars, signs/symptoms and treatment of hyper/hypoglycemia, monitoring blood sugars, taking medications as prescribed, benefits of exercising 30 minutes per day and prevention of complications of DM. Low sodium high fiber diet.   Teaching Method Utilized:  Visual Auditory Hands on  Handouts given during visit include:  The Plate Method  Low Sodium Diet  Meal Plan Card  Barriers to learning/adherence to lifestyle change: None  Demonstrated degree of understanding via:  Teach Back   Monitoring/Evaluation:  Dietary intake, exercise, meal planning, and body weight in 1 month(s). May benefit from Metformin 500 mg BID for cardiovascular health.  Encouraged him to keep appointments with counselor to help with grief counseling and life stressors.

## 2015-12-07 NOTE — Patient Instructions (Addendum)
Goals 1. Follow Plate Method 2. Eat 3-4 carb choices per meal. 3. Increase fresh fruits and vegetables. 4. Avoid canned and processed meats, vegetables and salty foods. 5. Drink 4-40 oz bottles of water per day 6. Lose 1-2 lbs per week. Talk to [facility] Nurse about fluid limits.

## 2015-12-08 ENCOUNTER — Ambulatory Visit: Payer: Commercial Managed Care - HMO | Admitting: Psychology

## 2015-12-10 ENCOUNTER — Other Ambulatory Visit: Payer: Self-pay | Admitting: *Deleted

## 2015-12-10 DIAGNOSIS — I10 Essential (primary) hypertension: Secondary | ICD-10-CM | POA: Diagnosis not present

## 2015-12-10 DIAGNOSIS — M5412 Radiculopathy, cervical region: Secondary | ICD-10-CM | POA: Diagnosis not present

## 2015-12-10 DIAGNOSIS — Z6841 Body Mass Index (BMI) 40.0 and over, adult: Secondary | ICD-10-CM | POA: Diagnosis not present

## 2015-12-10 NOTE — Patient Outreach (Signed)
[facility]) Care Management  12/10/2015  [name] [name] [birth year] [id number]  Call received from Mr. Hipwell today after her returned home from seeing neurosurgery, Dr. Leeroy Cha regarding his imaging results. To Mr. Mccarey's understanding, Dr. Joya Salm offered surgery or medical management including a muscle relaxer (has prescription/can't read it). Mr. Daffin was apologetic for being tearful and "emotional" and said he felt he was just getting on his feet and this news was discouraging to him.   I encouraged Mr. Marmolejos to talk this over with his children. One son lives locally and he will see his other son tomorrow when he takes a previously planned overnight trip to [place] for a visit. Mr. Bireley noted that Dr. Joya Salm said he planned to discuss the imaging findings and recommendations with Mr. Lythgoe's medical team prior to making further recommendations/plans.   We discussed the use of muscle relaxant medications as length and I advised that he take his first dose when is going to be home and doesn't have plans to drive. I explained that he may not experience maximum benefit of the medication until he has been taking it for 7-10 days. Mr. Lubinski was very concerned about being told the medications could adversely affect his kidneys. I assured him his labs would be followed closely and his providers could recommend any changes needed if he experienced unwanted effects. Mr. Bova plans to have his prescription filled today but will wait until he returns home on Saturday evening before taking his first dose.   Plan: Mr. Rzepka will call me on Monday to let me know how his first doses of muscle relaxants worked. I will follow closely and assist Mr. Galo will education, planning, and care coordination around this new medical decision and his ongoing chronic health conditions.    [facility]  [phone]

## 2015-12-15 ENCOUNTER — Other Ambulatory Visit: Payer: Self-pay | Admitting: *Deleted

## 2015-12-15 NOTE — Patient Outreach (Signed)
[name]) Care Management  12/15/2015  GRAISEN BERGEMAN 1946-03-05 [id number]  I reached out to Mr. Tates this afternoon to follow up on his CHF disease management and how he is doing with his new medication prescribed for treatment of discomfort and symptoms related to his cervical spine stenosis.   CHF - Mr. Marburger said he visited his son over the weekend and maybe ate a few things he shouldn't have and has gained 2 pounds. But he has been working to correct his dietary intake this week and get those pounds off. His weight today is up 2 pounds from last week.   Medication Management - Mr. Schindel plans to start taking Meloxicam 15mg  qhs this evening as prescribed by Dr. Leeroy Cha. Mr. Selig understands from Dr. Joya Salm that it may 10 days for the medication to "start doing its job". He also said he understood the medication could cause drowsiness and upset stomach. We discussed med administration timing and prevention of side effects. Mr. Blass usually goes to bed around 10pm or 10:30pm. He will have a light evening snack at 9:30pm and take his medication this evening.  Plan: I will call Mr. Celli on Friday to follow up on his weights and how he is doing with the [facility]  [phone]

## 2015-12-18 ENCOUNTER — Other Ambulatory Visit: Payer: Self-pay | Admitting: *Deleted

## 2015-12-18 NOTE — Patient Outreach (Signed)
  [facility]) Care Management  12/18/2015  [name] [name] [birth year] [id number]  Call received from [name] [name] this afternoon. He reports having worked for a long period of time in his yard yesterday and "not drinking much". He had a regular evening meal and retired at his usual time. Around 3 am this morning, [name] [name] awoke with "searing" pain in the left lower abdomen. He says he was unable to urinate at that time but was able to urinate around 7am and has urinated 3 or 4 times more today but notices that his urine seems "a little darker than usual". [name] [name] is concerned that this issue might be related to his initiation of Meloxicam as prescribed by Dr. Leeroy Cha.   [name] [name] is inclined to hold his Meloxicam over the weekend, get some rest, and try to gently hydrate himself. He says today he feels better and has had no further pain but feels "a little off". I advised [name] [name] to go to the emergency department immediately if he had acute/sudden onset of pain again, noted blood in his urine, was unable to urinate, noted that his urine was getting darker, had fever, or began to feel ill. He denies chest pain, shortness of breath, dizziness, lightheadedness, or other new symptom.  I notified Dr. Bonner Puna via inbasket message of the symptoms as outlined above and called [facility] @ [phone], leaving a message on the physician/pharmacy line.    [facility]  [phone]

## 2015-12-20 ENCOUNTER — Encounter (HOSPITAL_COMMUNITY): Payer: Self-pay

## 2015-12-20 ENCOUNTER — Emergency Department (HOSPITAL_COMMUNITY)
Admission: EM | Admit: 2015-12-20 | Discharge: 2015-12-20 | Disposition: A | Discharge status: Home / Self Care | Payer: Commercial Managed Care - HMO | Attending: Emergency Medicine | Admitting: Emergency Medicine

## 2015-12-20 ENCOUNTER — Emergency Department (HOSPITAL_COMMUNITY): Payer: Commercial Managed Care - HMO

## 2015-12-20 DIAGNOSIS — E785 Hyperlipidemia, unspecified: Secondary | ICD-10-CM | POA: Diagnosis not present

## 2015-12-20 DIAGNOSIS — K429 Umbilical hernia without obstruction or gangrene: Secondary | ICD-10-CM

## 2015-12-20 DIAGNOSIS — E876 Hypokalemia: Secondary | ICD-10-CM

## 2015-12-20 DIAGNOSIS — I2510 Atherosclerotic heart disease of native coronary artery without angina pectoris: Secondary | ICD-10-CM | POA: Insufficient documentation

## 2015-12-20 DIAGNOSIS — Z9079 Acquired absence of other genital organ(s): Secondary | ICD-10-CM | POA: Diagnosis not present

## 2015-12-20 DIAGNOSIS — Z85828 Personal history of other malignant neoplasm of skin: Secondary | ICD-10-CM | POA: Insufficient documentation

## 2015-12-20 DIAGNOSIS — N201 Calculus of ureter: Secondary | ICD-10-CM | POA: Diagnosis not present

## 2015-12-20 DIAGNOSIS — K5730 Diverticulosis of large intestine without perforation or abscess without bleeding: Secondary | ICD-10-CM

## 2015-12-20 DIAGNOSIS — D3500 Benign neoplasm of unspecified adrenal gland: Secondary | ICD-10-CM | POA: Diagnosis not present

## 2015-12-20 DIAGNOSIS — D3501 Benign neoplasm of right adrenal gland: Secondary | ICD-10-CM

## 2015-12-20 DIAGNOSIS — N132 Hydronephrosis with renal and ureteral calculous obstruction: Secondary | ICD-10-CM | POA: Diagnosis not present

## 2015-12-20 DIAGNOSIS — Z8673 Personal history of transient ischemic attack (TIA), and cerebral infarction without residual deficits: Secondary | ICD-10-CM | POA: Insufficient documentation

## 2015-12-20 DIAGNOSIS — Z8546 Personal history of malignant neoplasm of prostate: Secondary | ICD-10-CM | POA: Diagnosis not present

## 2015-12-20 DIAGNOSIS — Z7982 Long term (current) use of aspirin: Secondary | ICD-10-CM | POA: Insufficient documentation

## 2015-12-20 DIAGNOSIS — K760 Fatty (change of) liver, not elsewhere classified: Secondary | ICD-10-CM

## 2015-12-20 DIAGNOSIS — Z86018 Personal history of other benign neoplasm: Secondary | ICD-10-CM | POA: Diagnosis not present

## 2015-12-20 DIAGNOSIS — F329 Major depressive disorder, single episode, unspecified: Secondary | ICD-10-CM | POA: Diagnosis not present

## 2015-12-20 DIAGNOSIS — Z79899 Other long term (current) drug therapy: Secondary | ICD-10-CM | POA: Insufficient documentation

## 2015-12-20 DIAGNOSIS — Z6841 Body Mass Index (BMI) 40.0 and over, adult: Secondary | ICD-10-CM | POA: Diagnosis not present

## 2015-12-20 DIAGNOSIS — R1032 Left lower quadrant pain: Secondary | ICD-10-CM | POA: Diagnosis present

## 2015-12-20 DIAGNOSIS — I10 Essential (primary) hypertension: Secondary | ICD-10-CM | POA: Insufficient documentation

## 2015-12-20 LAB — URINALYSIS, ROUTINE W REFLEX MICROSCOPIC
BILIRUBIN URINE: NEGATIVE
Glucose, UA: NEGATIVE mg/dL
Ketones, ur: NEGATIVE mg/dL
Leukocytes, UA: NEGATIVE
Nitrite: NEGATIVE
PH: 6 (ref 5.0–8.0)
Protein, ur: NEGATIVE mg/dL
SPECIFIC GRAVITY, URINE: 1.019 (ref 1.005–1.030)

## 2015-12-20 LAB — COMPREHENSIVE METABOLIC PANEL
ALBUMIN: 3.7 g/dL (ref 3.5–5.0)
ALK PHOS: 66 U/L (ref 38–126)
ALT: 29 U/L (ref 17–63)
ANION GAP: 10 (ref 5–15)
AST: 20 U/L (ref 15–41)
BUN: 22 mg/dL — AB (ref 6–20)
CALCIUM: 9.5 mg/dL (ref 8.9–10.3)
CO2: 25 mmol/L (ref 22–32)
Chloride: 102 mmol/L (ref 101–111)
Creatinine, Ser: 1.41 mg/dL — ABNORMAL HIGH (ref 0.61–1.24)
GFR calc Af Amer: 57 mL/min — ABNORMAL LOW (ref >60)
GFR calc non Af Amer: 49 mL/min — ABNORMAL LOW (ref >60)
GLUCOSE: 211 mg/dL — AB (ref 65–99)
Potassium: 3.2 mmol/L — ABNORMAL LOW (ref 3.5–5.1)
SODIUM: 137 mmol/L (ref 135–145)
Total Bilirubin: 0.8 mg/dL (ref 0.3–1.2)
Total Protein: 6.7 g/dL (ref 6.5–8.1)

## 2015-12-20 LAB — CBC
HCT: 43.8 % (ref 39.0–52.0)
HEMOGLOBIN: 14.4 g/dL (ref 13.0–17.0)
MCH: 27.2 pg (ref 26.0–34.0)
MCHC: 32.9 g/dL (ref 30.0–36.0)
MCV: 82.6 fL (ref 78.0–100.0)
Platelets: 237 10*3/uL (ref 150–400)
RBC: 5.3 MIL/uL (ref 4.22–5.81)
RDW: 15.9 % — AB (ref 11.5–15.5)
WBC: 10.3 10*3/uL (ref 4.0–10.5)

## 2015-12-20 LAB — URINE MICROSCOPIC-ADD ON

## 2015-12-20 LAB — LIPASE, BLOOD: Lipase: 22 U/L (ref 11–51)

## 2015-12-20 MED ORDER — HYDROMORPHONE HCL 1 MG/ML IJ SOLN
1.0000 mg | Freq: Once | INTRAMUSCULAR | Status: AC
  Administered 2015-12-20: 1 mg via INTRAVENOUS
  Filled 2015-12-20: qty 1

## 2015-12-20 MED ORDER — ONDANSETRON HCL 4 MG/2ML IJ SOLN
4.0000 mg | Freq: Once | INTRAMUSCULAR | Status: AC
  Administered 2015-12-20: 4 mg via INTRAVENOUS
  Filled 2015-12-20: qty 2

## 2015-12-20 MED ORDER — POTASSIUM CHLORIDE CRYS ER 20 MEQ PO TBCR
40.0000 meq | EXTENDED_RELEASE_TABLET | Freq: Once | ORAL | Status: AC
  Administered 2015-12-20: 40 meq via ORAL
  Filled 2015-12-20: qty 2

## 2015-12-20 MED ORDER — SODIUM CHLORIDE 0.9 % IV BOLUS (SEPSIS)
500.0000 mL | Freq: Once | INTRAVENOUS | Status: AC
  Administered 2015-12-20: 500 mL via INTRAVENOUS

## 2015-12-20 MED ORDER — OXYCODONE-ACETAMINOPHEN 5-325 MG PO TABS
1.0000 | ORAL_TABLET | ORAL | Status: DC | PRN
  Administered 2015-12-20: 1 via ORAL

## 2015-12-20 MED ORDER — OXYCODONE-ACETAMINOPHEN 5-325 MG PO TABS
ORAL_TABLET | ORAL | Status: AC
  Filled 2015-12-20: qty 1

## 2015-12-20 MED ORDER — OXYCODONE-ACETAMINOPHEN 5-325 MG PO TABS
1.0000 | ORAL_TABLET | Freq: Once | ORAL | Status: AC
  Administered 2015-12-20: 1 via ORAL
  Filled 2015-12-20: qty 1

## 2015-12-20 MED ORDER — ONDANSETRON HCL 4 MG PO TABS: 4.0000 mg | ORAL_TABLET | Freq: Four times a day (QID) | ORAL | Status: DC

## 2015-12-20 MED ORDER — OXYCODONE-ACETAMINOPHEN 5-325 MG PO TABS: 1.0000 | ORAL_TABLET | ORAL | Status: DC | PRN

## 2015-12-20 NOTE — ED Notes (Signed)
Bladder scan reads 121 ml.

## 2015-12-20 NOTE — Discharge Instructions (Signed)
Your evaluation today which included blood work and a CT of her abdomen and pelvis was remarkable for a 4 mm stone in your left ureter. This is likely the cause of your pain. Call your Urologist tomorrow to schedule a follow up appointment.  There were also incidental findings on your scan of coronary atherosclerosis, diffuse hepatic steatosis, right adrenal adenoma, mild sigmoid diverticulosis, and a small umbilical hernia. These findings do not require emergent intervention, you can follow-up with Dr. Bonner Puna. Take Percocet every 4 hours as needed for pain.  Zofran for nausea.  Return to the ED if you experience worsening pain, fever, or are unable to keep fluids down.    Kidney Stones Kidney stones (urolithiasis) are deposits that form inside your kidneys. The intense pain is caused by the stone moving through the urinary tract. When the stone moves, the ureter goes into spasm around the stone. The stone is usually passed in the urine.  CAUSES   A disorder that makes certain neck glands produce too much parathyroid hormone (primary hyperparathyroidism).  A buildup of uric acid crystals, similar to gout in your joints.  Narrowing (stricture) of the ureter.  A kidney obstruction present at birth (congenital obstruction).  Previous surgery on the kidney or ureters.  Numerous kidney infections. SYMPTOMS   Feeling sick to your stomach (nauseous).  Throwing up (vomiting).  Blood in the urine (hematuria).  Pain that usually spreads (radiates) to the groin.  Frequency or urgency of urination. DIAGNOSIS   Taking a history and physical exam.  Blood or urine tests.  CT scan.  Occasionally, an examination of the inside of the urinary bladder (cystoscopy) is performed. TREATMENT   Observation.  Increasing your fluid intake.  Extracorporeal shock wave lithotripsy--This is a noninvasive procedure that uses shock waves to break up kidney stones.  Surgery may be needed if you have  severe pain or persistent obstruction. There are various surgical procedures. Most of the procedures are performed with the use of small instruments. Only small incisions are needed to accommodate these instruments, so recovery time is minimized. The size, location, and chemical composition are all important variables that will determine the proper choice of action for you. Talk to your health care provider to better understand your situation so that you will minimize the risk of injury to yourself and your kidney.  HOME CARE INSTRUCTIONS   Drink enough water and fluids to keep your urine clear or pale yellow. This will help you to pass the stone or stone fragments.  Strain all urine through the provided strainer. Keep all particulate matter and stones for your health care provider to see. The stone causing the pain may be as small as a grain of salt. It is very important to use the strainer each and every time you pass your urine. The collection of your stone will allow your health care provider to analyze it and verify that a stone has actually passed. The stone analysis will often identify what you can do to reduce the incidence of recurrences.  Only take over-the-counter or prescription medicines for pain, discomfort, or fever as directed by your health care provider.  Keep all follow-up visits as told by your health care provider. This is important.  Get follow-up X-rays if required. The absence of pain does not always mean that the stone has passed. It may have only stopped moving. If the urine remains completely obstructed, it can cause loss of kidney function or even complete destruction of the kidney.  It is your responsibility to make sure X-rays and follow-ups are completed. Ultrasounds of the kidney can show blockages and the status of the kidney. Ultrasounds are not associated with any radiation and can be performed easily in a matter of minutes.  Make changes to your daily diet as told by  your health care provider. You may be told to:  Limit the amount of salt that you eat.  Eat 5 or more servings of fruits and vegetables each day.  Limit the amount of meat, poultry, fish, and eggs that you eat.  Collect a 24-hour urine sample as told by your health care provider.You may need to collect another urine sample every 6-12 months. SEEK MEDICAL CARE IF:  You experience pain that is progressive and unresponsive to any pain medicine you have been prescribed. SEEK IMMEDIATE MEDICAL CARE IF:   Pain cannot be controlled with the prescribed medicine.  You have a fever or shaking chills.  The severity or intensity of pain increases over 18 hours and is not relieved by pain medicine.  You develop a new onset of abdominal pain.  You feel faint or pass out.  You are unable to urinate.   This information is not intended to replace advice given to you by your health care provider. Make sure you discuss any questions you have with your health care provider.   Document Released: 07/18/2005 Document Revised: 04/08/2015 Document Reviewed: 12/19/2012 Elsevier Interactive Patient Education [employer].
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
## 2015-12-20 NOTE — ED Notes (Signed)
Pt from home with complaint of LLQ abd pain that began yesterday morning around 0300. Pt admits to nausea, no vomiting, no diarrhea. Pt states "I am having a hard time urinating" Denies fever or chills. Pt ambulatory.

## 2015-12-21 ENCOUNTER — Ambulatory Visit: Payer: Self-pay | Admitting: Psychology

## 2015-12-21 DIAGNOSIS — Z0000 Encounter for general adult medical examination without abnormal findings: Secondary | ICD-10-CM | POA: Diagnosis not present

## 2015-12-21 DIAGNOSIS — N200 Calculus of kidney: Secondary | ICD-10-CM | POA: Diagnosis not present

## 2015-12-22 ENCOUNTER — Other Ambulatory Visit: Payer: Self-pay | Admitting: *Deleted

## 2015-12-22 NOTE — Patient Outreach (Signed)
[facility]) Care Management  12/22/2015  [name] [name] 13-Dec-1945 [id number]  Call received from [name] [name] this morning to report weight gain of 11# since 12/02/15. His weight today is 350# and he has mild swelling of both ankles according to his report. He was out of town weekend before last and had less control over food choices, was started on Percocet when he presented to the ED last weekend and found to have a kidney stone and says he has been quite constipated. He took a mild laxative and his diuretic this morning and said he has been voiding off and on all morning long and had a large BM. He denies shortness of breath, chest pain, nausea, worsened fatigue.   Plan: [name] [name] will continue all medications as prescribed. He plans to decrease use of narcotic analgesics and has returned to his prescribed low sodium/heart healthy diet. He will weigh daily and report if his weight does not consistently decrease over the next several days or if he gains 3# overnight or 5# in a week OR if he has new or worsened symptoms. [name] [name] will follow up with his urologist in 2 weeks. I will see [name] [name] at home next week on Tuesday for routine home visit.    [place] [facility]  [phone]

## 2015-12-23 ENCOUNTER — Other Ambulatory Visit: Payer: Self-pay | Admitting: *Deleted

## 2015-12-23 NOTE — Patient Outreach (Signed)
[facility]) Care Management  12/23/2015  [name] [name] 1946/06/29 [id number]   Call received from [name] who reports that he "had another spell" today referring to left sided abdominal/back pain "like I had the other day". He has known 72mm ureteral stone and was seen by the urologist last week. [name] was prescribed Flomax but didn't have it filled until today and is to start this evening. He denies subjective fever, does not have nausea, is able to eat and drink, and experienced relief with prescribed narcotic analgesic.   Plan: [name] will see Dr. Bonner Puna for previously scheduled primary care appointment tomorrow. He has a urology follow up appointment in approximately 2 weeks. I will see [name] at home next week for routine home visit.    [facility]  [phone]

## 2015-12-24 ENCOUNTER — Encounter: Payer: Self-pay | Admitting: Family Medicine

## 2015-12-24 ENCOUNTER — Ambulatory Visit (INDEPENDENT_AMBULATORY_CARE_PROVIDER_SITE_OTHER): Payer: Commercial Managed Care - HMO | Admitting: Family Medicine

## 2015-12-24 VITALS — BP 122/64 | HR 88 | Temp 98.1°F | Ht 73.0 in | Wt 350.8 lb

## 2015-12-24 DIAGNOSIS — E119 Type 2 diabetes mellitus without complications: Secondary | ICD-10-CM

## 2015-12-24 DIAGNOSIS — F329 Major depressive disorder, single episode, unspecified: Secondary | ICD-10-CM | POA: Diagnosis not present

## 2015-12-24 DIAGNOSIS — I10 Essential (primary) hypertension: Secondary | ICD-10-CM

## 2015-12-24 DIAGNOSIS — E669 Obesity, unspecified: Secondary | ICD-10-CM | POA: Diagnosis not present

## 2015-12-24 DIAGNOSIS — N201 Calculus of ureter: Secondary | ICD-10-CM

## 2015-12-24 DIAGNOSIS — F32A Depression, unspecified: Secondary | ICD-10-CM

## 2015-12-24 MED ORDER — METFORMIN HCL 500 MG PO TABS: 500.0000 mg | ORAL_TABLET | Freq: Two times a day (BID) | ORAL | Status: DC

## 2015-12-24 MED ORDER — LOSARTAN POTASSIUM 25 MG PO TABS: 25.0000 mg | ORAL_TABLET | Freq: Every day | ORAL | Status: DC

## 2015-12-24 NOTE — Assessment & Plan Note (Signed)
Actually improving despite no longer being on therapy. Has seen a counselor but did not find it helpful. PHQ-9: 2, somewhat difficult. No SI/HI. Reluctant to start medications, and I doubt they would be helpful, so I've encouraged continual involvement of his family and we will continue to monitor.

## 2015-12-24 NOTE — Progress Notes (Signed)
Subjective: JABIN TAPP is a 70 y.o. male presenting for follow up of multiple medical issues.   Recently presented to ED for colicky left abdomen pain. 67m ureteral stone. His pain is slightly improving, taking flomax, not straining urine all the time.   Diabetes: Recently found to have Hb A1c of 7.6% on screening during last admission on 4/19 (previous testing negative). He's met with an RD, PJearld Fenton who has helped educate him regarding diabetes nutrition. He denies polyphagia, polydipsia. + Increased urinary frequency with recent kidney stone. No vision changes. Has a history of cough with lisinopril. Takes ASA and crestor.   Depression: He reports he is no longer taking zoloft or wellbutrin. [facility] met with after his father died, though this did not help. He reports having support from his children which has helped significantly. 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   Objective: BP 122/64 mmHg  Pulse 88  Temp(Src) 98.1 F (36.7 C) (Oral)  Ht '6\' 1"'  (1.854 m)  Wt 350 lb 12.8 oz (159.122 kg)  BMI 46.29 kg/m2 Gen: Obese, well-appearing 70y.o. male in no distress CV: Regular rate, no murmur; radial, DP and PT pulses 2+ bilaterally; minimal LE edema, no JVD, cap refill < 2 sec. Pulm: Non-labored breathing ambient air; CTAB, no wheezes or crackles GI: Normoactive-BS; soft and obese, non-tender, non-distended, no organomegaly, no hernia appreciated. No CVA tenderness  Assessment/Plan: RROBERTO ROMANOSKIis a 70y.o. male here for multiple medical issues. Essential hypertension At goal, continue current therapy and trial lowest dose of ARB (cough with ACE) for renal protection. HFrEF  would be additional indication, though he only has diastolic dysfunction on echo. Will monitor for hypotension closely and recheck labs at next office visit.   T2DM (type 2 diabetes mellitus) ([place] Based on Hb A1c of 7.6%. No symptoms of uncontrolled hyperglycemia at this time.  - Start metformin 5042mqAM with food, up to BID if tolerates x 5 days.  - Start losartan - On an ASA and crestor daily - Follow up in about 2 months with repeat A1c.   Obesity Seeing RD as past of diabetes education with ongoing modification to lifestyle. Also urged to walk as tolerated (has significant back/neck problems which will eventually require surgery) to help with weight loss and with mood.   Depression Actually improving despite no longer being on therapy. Has seen a counselor but did not find it helpful. PHQ-9: 2, somewhat difficult. No SI/HI. Reluctant to start medications, and I doubt they would be helpful, so I've encouraged continual involvement of his family and we will continue to monitor.   Ureterolithiasis Seen at urology. Resolving symptoms, though rarely strains urine and does not know if it has passed yet. Based on size (34m334mand location (UVJ), it should pass spontaneously.  - Recommend adequate hydration, pain medications as needed, and to call if still symptomatic in 1 - 2 weeks.

## 2015-12-24 NOTE — Assessment & Plan Note (Signed)
Based on Hb A1c of 7.6%. No symptoms of uncontrolled hyperglycemia at this time.  - Start metformin 500mg  qAM with food, up to BID if tolerates x 5 days.  - Start losartan - On an ASA and crestor daily - Follow up in about 2 months with repeat A1c.

## 2015-12-24 NOTE — Assessment & Plan Note (Signed)
At goal, continue current therapy and trial lowest dose of ARB (cough with ACE) for renal protection. HFrEF would be additional indication, though he only has diastolic dysfunction on echo. Will monitor for hypotension closely and recheck labs at next office visit.

## 2015-12-24 NOTE — Assessment & Plan Note (Signed)
Seeing RD as past of diabetes education with ongoing modification to lifestyle. Also urged to walk as tolerated (has significant back/neck problems which will eventually require surgery) to help with weight loss and with mood.

## 2015-12-24 NOTE — Assessment & Plan Note (Addendum)
Seen at urology. Resolving symptoms, though rarely strains urine and does not know if it has passed yet. Based on size (94mm) and location (UVJ), it should pass spontaneously.  - Recommend adequate hydration, pain medications as needed, and to call if still symptomatic in 1 - 2 weeks.

## 2015-12-24 NOTE — Patient Instructions (Signed)
Thank you for coming in today!  - For diabetes, start taking METFORMIN 500mg  in the morning with food. If you tolerate this, start taking 500mg  twice a day - Start taking losartan for kidney protection (because you have diabetes, you are at higher risk for chronic kidney disease).   Our clinic's number is [phone]. Feel free to call any time with questions or concerns. We will answer any questions after hours with our 24-hour emergency line at that number as well.   - Dr. Bonner Puna

## 2015-12-29 ENCOUNTER — Other Ambulatory Visit: Payer: Self-pay | Admitting: *Deleted

## 2015-12-29 ENCOUNTER — Encounter: Payer: Self-pay | Admitting: *Deleted

## 2015-12-29 MED ORDER — FUROSEMIDE 40 MG PO TABS: 40.0000 mg | ORAL_TABLET | Freq: Every day | ORAL | Status: DC

## 2015-12-29 NOTE — Patient Outreach (Signed)
[facility]) Care Management   12/29/2015  [name] [name] Oct 13, 1945 [id number]  [name] [name] is an 70 y.o. gentleman who lives alone in [place], [place]. 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 [name] [name] was primary caregiver for his wife during her battle with cancer and who passed away one year ago. [name] [name] admits that he had become depressed and feels this led to his other health problems. He independently sought treatment for his depression but is no longer on treatment and not seeing a psychiatric provider.   [name] [name] presented to the hospital on 11/18/15 after a week of swelling, joint pains, and a 40lb weight gain over the course of the previous month. He was admitted, evaluated, and treated for volume overload/acute diastolic heart failure and diuresed. [name] [name] was referred to [place] [facility] for assistance with CHF and DM Disease management and care coordination at that time.  Subjective: "I really think I'm getting on track. I have to stay on top of the kidney stone thing and I'm holding off on the neck surgery for now."   Objective:  BP 124/76 mmHg  Pulse 85  Wt 345 lb (156.491 kg)  SpO2 96%  Review of Systems  Constitutional: Negative.   HENT: Negative.   Eyes: Negative.   Respiratory: Negative for cough, sputum production, shortness of breath and wheezing.   Cardiovascular: Negative for chest pain, palpitations, orthopnea, leg swelling and PND.  Gastrointestinal: Negative.   Genitourinary: Positive for dysuria and flank pain. Negative for hematuria.       Dysuria and flank pain x 2 over the last 2 weeks; diagnosed with renal calculi; no pain x 1 week  Musculoskeletal: Negative for falls.  Skin: Negative.   Neurological: Positive for focal weakness.       Focal weakness, tingling, poor grip strength left hand  Psychiatric/Behavioral: Negative.        Reports "I've snapped out of it", explaining he felt  he was "past" his depression    Physical Exam  Constitutional: He is oriented to person, place, and time. Vital signs are normal. He appears well-developed and well-nourished. He is active. He does not have a sickly appearance. He does not appear ill. No distress.  Cardiovascular: Normal rate, regular rhythm, S1 normal and S2 normal.  Exam reveals no gallop and no friction rub.   No murmur heard. Respiratory: Effort normal and breath sounds normal. He has no wheezes. He has no rhonchi. He has no rales.  GI: Soft. Bowel sounds are normal. There is no tenderness. There is no rebound and no guarding.  Musculoskeletal:       Right hand: Decreased strength noted.  Neurological: He is alert and oriented to person, place, and time.  Skin: Skin is warm, dry and intact.  Psychiatric: He has a normal mood and affect. His speech is normal and behavior is normal. Judgment and thought content normal. Cognition and memory are normal.    Encounter Medications:   Outpatient Encounter Prescriptions as of 12/29/2015  Medication Sig Note  . aspirin EC 325 MG tablet Take 1 tablet (325 mg total) by mouth daily.   . furosemide (LASIX) 40 MG tablet Take 1 tablet (40 mg total) by mouth daily.   . hydrochlorothiazide (HYDRODIURIL) 25 MG tablet Take 1 tablet (25 mg total) by mouth daily.   [name] losartan (COZAAR) 25 MG tablet Take 1 tablet (25 mg total) by mouth daily.   . metFORMIN (GLUCOPHAGE)  500 MG tablet Take 1 tablet (500 mg total) by mouth 2 (two) times daily with a meal.   . metoprolol succinate (TOPROL-XL) 25 MG 24 hr tablet Take 0.5 tablets (12.5 mg total) by mouth daily.   . rosuvastatin (CRESTOR) 5 MG tablet TAKE ONE TABLET BY MOUTH ONCE DAILY.   [name] ondansetron (ZOFRAN) 4 MG tablet Take 1 tablet (4 mg total) by mouth every 6 (six) hours. (Patient not taking: Reported on 12/29/2015) 12/29/2015: On hand; not using  . oxyCODONE-acetaminophen (PERCOCET/ROXICET) 5-325 MG tablet Take 1 tablet by mouth every 4 (four)  hours as needed for severe pain. (Patient not taking: Reported on 12/29/2015) 12/29/2015: On hand; not using  . pregabalin (LYRICA) 75 MG capsule Take 1 capsule (75 mg total) by mouth 2 (two) times daily. (Patient not taking: Reported on 12/07/2015) 12/29/2015: Oh hand; not using   Assessment:  [name]. Miske is a 70 year old gentleman discharged from the hospital on 11/21/15 with new diagnoses of CHF and DM.   New/Chronic Health Condition (CHF) - [name] [name] has gained knowledge of CHF and signs and symptoms. He is weighing himself daily and recording. I have provided documentation tools and print educational materials related to his new diagnosis. We reviewed his medications in detail, paying particular attention to which medications most affect his cardiovascular health. [name] [name] is very aware of dietary recommendations and how his dietary intake drastically affects his volume management.    New/Chronic Health Condition (DM) - [name] [name] did not know until this recent hospitalization that his HgA1C was elevated (7.6/5.6 1 year ago). We discussed the difference between point of care cbg or venous sample glucose readings and HgA1C as well as HgA1C and point of care goals. [name] [name] is seeing Jearld Fenton RD, CDE for education and help with self health management.   Chronic Health Condition (depression) - [name] [name] was seeing a psychiatrist and counselor and taking medications until about 2 weeks ago when he said he felt like he was able to manage his emotions on his own and with the help of his faith and his family.   Chronic Health condition (neck pain) - [name] [name] has chronic neck pain and numbness in his left 4th (ring) and pinky (5th) fingers. He has had a myelogram and CT scan and seen Dr. Leeroy Cha for consultation. Dr. Joya Salm offered medical treatment (medications) or surgery. [name]. Stumph says he has poor grip strength in his left hand and intermittent numbness and tingling  but wants to "put off" surgery for as long as possible.    No Advanced Directives in [place] - [name] [name], saying he now feels like his most acute problems are under control, felt he was "in the right frame of mind" to talk about Advanced Directives. I provided a packet and reviewed the materials with him, advising that he discuss the contents with his sons.   Plan:   [name] [name] will weigh daily and record, calling his cardiologist for weight gain of 3# overnight or 5# in a week.   [name] [name] will monitor his CHF symptoms daily and call for increased shortness of breath, increase in swelling, or any other symptom that is new or worsened.   [name]. Freund will follow up with Ms. Jearld Fenton RD, CDE as scheduled.   [name]. Crager and I discussed plans to transition to telephonic/health coaching after our next phone conversation.     [facility] CM Care Plan Problem One  Most Recent Value   Care Plan Problem One  Knowledge Deficit related to Acute Health Condition/New Diagnosis (CHF)   Role Documenting the Problem One  Care Management Coordinator   Care Plan for Problem One  Active       Most Recent Value   Care Plan Problem Two  No Advanced Directives in Place   Role Documenting the Problem Two  Care Management [place] for Problem Two  Active   Interventions for Problem Two Long Term Goal   Provided Advanced Directives packet and discussed   THN Long Term Goal (31-90) days  Over the next 31 days, patient will verbalize plans for completion of ADvanced Directives   THN Long Term Goal Start Date  12/29/15   THN Long Term Goal Met Date     THN CM Short Term Goal #1 (0-30 days)  Over the next 30 days, patient will meet with sons to discuss advanced directives   THN CM Short Term Goal #1 Start Date  12/29/15   [facility] CM Short Term Goal #1 Met Date      Interventions for Short Term Goal #2   Advised patient to discuss Advanced Directives with family members over the next 54  days      [place] [facility] Care Management  [phone]

## 2015-12-30 ENCOUNTER — Telehealth: Payer: Self-pay | Admitting: Family Medicine

## 2015-12-30 NOTE — Telephone Encounter (Signed)
Patient asks PCP to complete and sign DMV form. Please, follow up.

## 2016-01-01 NOTE — Telephone Encounter (Signed)
Form placed in PCP box for completion. Zimmerman Rumple, April D, CMA  

## 2016-01-02 NOTE — Telephone Encounter (Signed)
This was completed and placed in [name] [name]'s box at end of day 6/2.

## 2016-01-04 NOTE — Telephone Encounter (Signed)
Patient informed that placard placed in outgoing mail to home address.  Derl Barrow, RN

## 2016-01-08 ENCOUNTER — Other Ambulatory Visit: Payer: Self-pay | Admitting: Family Medicine

## 2016-01-11 ENCOUNTER — Encounter: Payer: Self-pay | Admitting: Nutrition

## 2016-01-11 ENCOUNTER — Encounter: Payer: Commercial Managed Care - HMO | Attending: Family Medicine | Admitting: Nutrition

## 2016-01-11 VITALS — Ht 73.0 in | Wt 354.0 lb

## 2016-01-11 DIAGNOSIS — IMO0002 Reserved for concepts with insufficient information to code with codable children: Secondary | ICD-10-CM

## 2016-01-11 DIAGNOSIS — E1165 Type 2 diabetes mellitus with hyperglycemia: Secondary | ICD-10-CM

## 2016-01-11 DIAGNOSIS — E119 Type 2 diabetes mellitus without complications: Secondary | ICD-10-CM | POA: Diagnosis not present

## 2016-01-11 DIAGNOSIS — E118 Type 2 diabetes mellitus with unspecified complications: Secondary | ICD-10-CM

## 2016-01-11 NOTE — Progress Notes (Signed)
  Medical Nutrition Therapy:  Appt start time: [id number] end time:  1700.  Assessment:  Primary concerns today: Obesity, DM Type 2 and CHF. Going to the [facility] now a few times. He is trying to get out more. Metfrormin 1000 mg once a day.   Has been buying a lot of frozen vegetables and fresh fruits and choosing healthier foods at meal times. Gained 4 lbs but doesn't know why. Hasn't been testing blood sugars because he wasn't told to with A1C 7.6%. He wouldn't mind testing to see his blood sugar readings and impact of his food choices.. Instructed on using a Verio One Touch meter and encouraged to test a few times per week. Meter given. Just now getting back into exercising and feeling better. Was started on Losartan last MD visit. Denies snacks and has cut out a lot of junk food and processed foods. Has been followed by nurse for CHF. Had an episode of kidney stones recently. Diet is doing much better and better balanced.. Will get A1C done in the next month or so.  Preferred Learning Style:   No preference indicated   Learning Readiness:     Ready  Change in progress   MEDICATIONS: See list   DIETARY INTAKE:    24-hr recall:  B ( AM): 2 eggs and whole bread, and fruit, water Snk ( AM): none  L ( PM): Toss salad, 2-3 tbsp  ranch, flat bread sandwich with [place] , water Snk ( PM):  D ( PM):  Chicken strips and baked potato and salad, water Snk ( PM):  Beverages: water   Usual physical activity:   Estimated energy needs: 1800 calories 200 g carbohydrates 135 g protein 50 g fat  Progress Towards Goal(s):  In progress.   Nutritional Diagnosis:  NB-1.1 Food and nutrition-related knowledge deficit As related to Diabetes, CHF and obesity.  As evidenced by A1C 7.6%, BMI > 40 and .    Intervention:  Nutrition and Diabetes education provided on My Plate, CHO counting, meal planning, portion sizes, timing of meals, avoiding snacks between meals unless having a low blood sugar,  target ranges for A1C and blood sugars, signs/symptoms and treatment of hyper/hypoglycemia, monitoring blood sugars, taking medications as prescribed, benefits of exercising 30 minutes per day and prevention of complications of DM. Low sodium high fiber diet. Meter training.   Goals: 1. Follow My Plate 2. Increase fresh fruits and low vegetable Focus on more protein with meals. 3. Exercise 30-60 minutes three per week. 4. Start testing blood sugars 2-3 tiimes per week. 5, Lose 1 lb per week. 6. Get A1C to 6.5% or lower.  Teaching Method Utilized:  Visual Auditory Hands on  Handouts given during visit include:  The Plate Method  Low Sodium Diet  Meal Plan Card  Barriers to learning/adherence to lifestyle change: None  Demonstrated degree of understanding via:  Teach Back   Monitoring/Evaluation:  Dietary intake, exercise, meal planning, and body weight in 1 month(s). [name]

## 2016-01-11 NOTE — Patient Instructions (Addendum)
Goals: 1. Follow My Plate 2. Increase fresh fruits and low vegetable Focus on more protein with meals. 3. Exercise 30-60 minutes three per week. 4. Start testing blood sugars 2-3 tiimes per week. 5, Lose 1 lb per week. 6. Get A1C to 6.5% or lower.

## 2016-01-14 ENCOUNTER — Telehealth: Payer: Self-pay | Admitting: Nutrition

## 2016-01-14 NOTE — Telephone Encounter (Signed)
TC from pt. Concerned that his FBS are still 203, 204 and 201 mg/dl for the last three mornings. He is eating 30 grams of carbs per meal. On 500 mg of Metformin BID. He is not eating past 7 pm and now exercising 3-4 days per week. Drinking about 64 oz of water per day. Advised to call PCP and request to schedule an appt to discuss other options to help improve blood sugars. Pt. Verbalized understanding.

## 2016-01-26 ENCOUNTER — Other Ambulatory Visit: Payer: Self-pay | Admitting: Family Medicine

## 2016-01-26 ENCOUNTER — Encounter: Payer: Self-pay | Admitting: Family Medicine

## 2016-01-26 ENCOUNTER — Ambulatory Visit (INDEPENDENT_AMBULATORY_CARE_PROVIDER_SITE_OTHER): Payer: Commercial Managed Care - HMO | Admitting: Family Medicine

## 2016-01-26 ENCOUNTER — Other Ambulatory Visit: Payer: Self-pay | Admitting: *Deleted

## 2016-01-26 VITALS — BP 149/67 | HR 80 | Temp 97.8°F | Ht 73.0 in | Wt 353.2 lb

## 2016-01-26 DIAGNOSIS — T502X5A Adverse effect of carbonic-anhydrase inhibitors, benzothiadiazides and other diuretics, initial encounter: Secondary | ICD-10-CM

## 2016-01-26 DIAGNOSIS — E876 Hypokalemia: Secondary | ICD-10-CM

## 2016-01-26 DIAGNOSIS — I10 Essential (primary) hypertension: Secondary | ICD-10-CM

## 2016-01-26 DIAGNOSIS — E119 Type 2 diabetes mellitus without complications: Secondary | ICD-10-CM

## 2016-01-26 DIAGNOSIS — E7800 Pure hypercholesterolemia, unspecified: Secondary | ICD-10-CM | POA: Diagnosis not present

## 2016-01-26 MED ORDER — GLUCOSE BLOOD VI STRP: ORAL_STRIP | Status: DC

## 2016-01-26 MED ORDER — METFORMIN HCL 500 MG PO TABS: ORAL_TABLET | ORAL | Status: DC

## 2016-01-26 MED ORDER — ROSUVASTATIN CALCIUM 5 MG PO TABS: 5.0000 mg | ORAL_TABLET | Freq: Every day | ORAL | Status: DC

## 2016-01-26 MED ORDER — ONETOUCH ULTRA 2 W/DEVICE KIT: 1.0000 | PACK | Freq: Once | Status: DC

## 2016-01-26 MED ORDER — LOSARTAN POTASSIUM-HCTZ 50-12.5 MG PO TABS: 1.0000 | ORAL_TABLET | Freq: Every day | ORAL | Status: DC

## 2016-01-26 MED ORDER — METOPROLOL SUCCINATE ER 25 MG PO TB24: 12.5000 mg | ORAL_TABLET | Freq: Every day | ORAL | Status: DC

## 2016-01-26 MED ORDER — FUROSEMIDE 40 MG PO TABS: 40.0000 mg | ORAL_TABLET | Freq: Every day | ORAL | Status: DC

## 2016-01-26 MED ORDER — ONETOUCH ULTRASOFT LANCETS MISC: Status: DC

## 2016-01-26 NOTE — Progress Notes (Signed)
Subjective: [name] [name] is a 70 y.o. male here for diabetes follow up.   He was diagnosed with T2DM recently, reporting 100% compliance with medications. He checks his sugars 2 times per day; forgot log, but these have been about 200mg /dl consistently.   Has been slowly increasing use of stationary bike at the senior center in [place]. Now at 3 miles daily. Taking losartan without issues (dry hacking cough has not returned). Checks weight daily, EDW is about 346lbs at home, and has been losing a bit of weight since exercising. Has been taking metforming BID without issues.   - ROS: Denies fever, chills, weight loss, dizziness, vision changes, syncope, polyuria, nocturia, numbness, polydipsia, chest pain, new wounds.  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 - Medications: reviewed and updated  Objective: BP 149/67 mmHg  Pulse 80  Temp(Src) 97.8 F (36.6 C) (Oral)  Ht 6\' 1"  (1.854 m)  Wt 353 lb 3.2 oz (160.21 kg)  BMI 46.61 kg/m2 Gen: Obese, well-appearing 70 y.o.male in no distress HEENT: Normocephalic, sclerae/conjunctivae clear, PERRL, MMM, posterior oropharynx clear, good dentition Neck: Neck supple, no masses or lymphadenopathy; thyroid not enlarged  Pulm: Non-labored; CTAB, no wheezes  CV: Regular rate, no murmur appreciated; trace symmetric LE edema, no JVD GI: Normoactive BS; soft, non-tender, non-distended, no HSM Skin: No wounds or rashes, no acanthosis nigricans Neuro: CN II-XII without deficits, sensation intact to light touch, steady gait. Lab Results  Component Value Date   K 3.2* 12/20/2015   Lab Results  Component Value Date   CREATININE 1.41* 12/20/2015   Lab Results  Component Value Date   HGBA1C 7.6* 11/18/2015   Lab Results  Component Value Date   CHOL 198 07/15/2014   HDL 31* 07/15/2014   LDLCALC 98 07/15/2014   LDLDIRECT 148* 11/15/2007   TRIG 344* 07/15/2014   CHOLHDL 6.4 07/15/2014   Assessment &  Plan: [name] [name] is a 70 y.o. male here for diabetes and HTN.    T2DM (type 2 diabetes mellitus) ([place]) Recently diagnosed (April 2017) related primarily to insulin resistance from morbid obesity. CBG checks at home have consistently been above goal (as expected from modest metformin regimen).  - Tolerating metformin at 500mg  BID. Will increase to 1000mg  qAM, 500mg  qPM.  - Rx CBG checks BID (fasting and with largest meal) - Continue MNT - Continue daily aerobic exercise as tolerated - Creatinine stable (1.4) in May, continue ARB (cough intolerance to lisinopril, tolerating cozaar) and check BMP today. May have to modify medications based on renal function.  - Continue ASA, crestor 5mg  (consider increase based on lipid panel). Check lipid panel.  - Expect recheck of Hb A1c on/after 02/17/2016  Essential hypertension Uncontrolled without symptoms or severe range. Room to increase on losartan, metoprolol, and HCTZ. Control important in limiting worsening of diastolic dysfxn ([id number], EF 60-65% in April 2017) and CKD. Goal for him is < 135/85.  - Increase losartan (checking BMP), decrease HCTZ modestly as decreased renal function is limiting benefit and urinary frequency is an issue. Will Rx in combination pill to lower pill burden.  - Refilled metoprolol. HR can stand to increase this, but in absence of compelling indication (no CAD), could consider discontinuing this and trial norvasc to improve BP control.  - Refilled lasix (will recheck K with BMP today) - Continue home BP monitoring and bring records to follow up.

## 2016-01-26 NOTE — Patient Outreach (Signed)
[facility]) Care Management   01/26/2016  [name] [name] 1945-11-04 [id number]  [name] [name] is an 70 y.o. gentleman who lives alone in [place], [place]. 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 [name] [name] was primary caregiver for his wife during her battle with cancer and who passed away one year ago. [name] [name] admits that he had become depressed and feels this led to his other health problems. He independently sought treatment for his depression but is no longer on treatment and not seeing a psychiatric provider.   [name] [name] presented to the hospital on 11/18/15 after a week of swelling, joint pains, and a 40lb weight gain over the course of the previous month. He was admitted, evaluated, and treated for volume overload/acute diastolic heart failure and diuresed. [name] [name] was referred to [place] [facility] for assistance with CHF and DM Disease management and care coordination at that time.  [name] [name] has since been following up regularly and as scheduled with his primary care provider, cardiologist, neurosurgeon, and Diabetes Educator/Registered Dietician. He is taking his medications as prescribed and is self monitoring (bp, weight, cbg). [name] [name] has also started exercising regularly at the [facility].   Subjective: "I wish I could get this weight off faster but it's not moving easily."  Objective:  Wt 346 lb (156.945 kg)  Review of Systems  Constitutional: Negative.   HENT:       Hoarseness new over last month; history of vocal cord paralysis  Eyes: Negative.   Respiratory: Negative.  Negative for sputum production, shortness of breath and wheezing.   Cardiovascular: Negative for chest pain, palpitations and leg swelling.  Gastrointestinal: Negative.   Genitourinary: Negative.   Musculoskeletal: Positive for myalgias. Negative for falls.  Skin: Negative.   Neurological: Positive for tingling,  speech change and focal weakness. Negative for dizziness, tremors, sensory change, seizures and loss of consciousness.       Left hand weakness and numbness/tingling left arm/hand/fingers; chronic; not worsened  Notably new hoarseness; history of vocal cord paralysis  Psychiatric/Behavioral: Negative.     Physical Exam  Constitutional: He is oriented to person, place, and time. Vital signs are normal. He appears well-developed and well-nourished. He is active. He does not appear ill. No distress.  Cardiovascular: Normal rate and regular rhythm.   Respiratory: Effort normal.  Neurological: He is alert and oriented to person, place, and time.  Skin: Skin is warm and dry.  Psychiatric: He has a normal mood and affect. His speech is normal and behavior is normal. Judgment and thought content normal. Cognition and memory are normal.    Encounter Medications:   Outpatient Encounter Prescriptions as of 01/26/2016  Medication Sig Note  . aspirin EC 325 MG tablet Take 1 tablet (325 mg total) by mouth daily.   . furosemide (LASIX) 40 MG tablet Take 1 tablet (40 mg total) by mouth daily.   . hydrochlorothiazide (HYDRODIURIL) 25 MG tablet TAKE 1 TABLET BY MOUTH ONCE DAILY.   [name] losartan (COZAAR) 25 MG tablet Take 1 tablet (25 mg total) by mouth daily.   . metFORMIN (GLUCOPHAGE) 500 MG tablet Take 1 tablet (500 mg total) by mouth 2 (two) times daily with a meal.   . metoprolol succinate (TOPROL-XL) 25 MG 24 hr tablet Take 0.5 tablets (12.5 mg total) by mouth daily.   . ondansetron (ZOFRAN) 4 MG tablet Take 1 tablet (4 mg total) by mouth every 6 (six) hours. 12/29/2015:  On hand; not using  . oxyCODONE-acetaminophen (PERCOCET/ROXICET) 5-325 MG tablet Take 1 tablet by mouth every 4 (four) hours as needed for severe pain. 12/29/2015: On hand; not using  . pregabalin (LYRICA) 75 MG capsule Take 1 capsule (75 mg total) by mouth 2 (two) times daily. 12/29/2015: Oh hand; not using  . rosuvastatin (CRESTOR) 5 MG  tablet TAKE ONE TABLET BY MOUTH ONCE DAILY.    Assessment:  [name] [name] is a 70 year old gentleman discharged from the hospital on 11/21/15 with new diagnoses of CHF and DM.   Chronic Health Condition (CHF) - [name] [name] has gained knowledge of CHF and signs and symptoms. He is weighing himself daily and recording. I have provided documentation tools and print educational materials related to his new diagnosis. We reviewed his medications in detail, paying particular attention to which medications most affect his cardiovascular health. [name] [name] is very aware of dietary recommendations and how his dietary intake drastically affects his volume management.   Chronic Health Condition (DM) - [name] [name] did not know until this recent hospitalization that his HgA1C was elevated (7.6/5.6 1 year ago). We discussed the difference between point of care cbg or venous sample glucose readings and HgA1C as well as HgA1C and point of care goals. [name] [name] is seeing Jearld Fenton RD, CDE for education and help with self health management. He has been checking cbg's daily but is out of supplies. His prescription has been sent and he will pick up supplies today and resume daily cbg checks and recording.   Chronic Health Condition (depression) - [name] [name] was seeing a psychiatrist and counselor and taking medications until about a month ago when he said he felt like he was able to manage his emotions on his own and with the help of his faith and his family.   Chronic Health condition (neck pain) - [name] [name] has chronic neck pain and numbness in his left 4th (ring) and pinky (5th) fingers. He has had a myelogram and CT scan and seen [name] [name] for consultation. [name] [name] offered medical treatment (medications) or surgery. [name] [name] says he has poor grip strength in his left hand and intermittent numbness and tingling but wants to "put off" surgery for as long as possible.   No Advanced  Directives in [place] - [name] [name], saying he now feels like his most acute problems are under control, felt he was "in the right frame of mind" to talk about Advanced Directives. I provided a packet and reviewed the materials with him, advising that he discuss the contents with his sons. He tells me today he hasn't had time to discuss Advanced Directives with his sons but plans to.   Acute/Chronic Health Condition (hoarseness) - [name]. Nethery has history of vocal cord paralysis s/p left CEA; he was referred to Dr. Johnna Acosta, DO at [facility] who, per [name]. Madia's report, gave him a "fat injection" that helped. [name]. Lilly was told the problem might be recurrent and require another injection. He plans to call Dr. Rowe Clack about his symptoms.   Plan:   [name]. Zamor will weigh daily and record, calling his cardiologist for weight gain of 3# overnight or 5# in a week.   [name]. Rizk will monitor his CHF symptoms daily and call for increased shortness of breath, increase in swelling, or any other symptom that is new or worsened.   [name]. Casalino will follow up with Ms. Jearld Fenton RD, CDE as scheduled and will call  Dr. Rowe Clack re: his hoarseness.        Most Recent Value   Care Plan Problem Three  Self Health Management Needs   Role Documenting the Problem Three  Care Management Coordinator   Care Plan for Problem Three  Active   THN Long Term Goal (31-90) days  Over the next 31 days, [name]. Schirtzinger will employ all newly learned self health management strategies including documentation of self monitoring, ordering needed refills, attending scheduled provider appointments, and reaching out to providers for needed new appointments   [place] Term Goal Start Date  01/26/16   Interventions for Problem Three Long Term Goal  Utilizing teachback method and blue [facility] Care Management calendar/health book, reviewed strategies and plans for self health management   THN CM Short Term Goal #1  (0-30 days)  Over the next 30 days, patient will call providers or RNCM with any questions or concenrs about self health management plan or with any new symptoms   THN CM Short Term Goal #1 Start Date  01/26/16   Interventions for Short Term Goal #1  Advised patient to call appropriate for provider for new or worsened problems or call RNCM with additional needs.       [place] Management  [phone]

## 2016-01-26 NOTE — Patient Instructions (Addendum)
Thank you for coming in today!  Type II diabetes mellitus:  - Increase metformin to 1000mg  in the morning, 500mg  in the evening. - A new glucometer (should be covered by medicare) was prescribed, check your blood sugar first thing in the morning and with your largest meal and bring a record of these values to your follow up appointment.   - Continue daily aerobic exercise as tolerated - Continue aspirin, crestor 5mg  - Expect recheck of Hb A1c on/after 02/17/2016  HTN:  - STOP taking your current pills of losartan and HCTZ because we are going to increase losartan, decrease HCTZ modestly. You will START a combination pill of these 2 medications to lower pill burden.  - Refilled metoprolol.  - Refilled lasix  - Continue home BP monitoring and bring records to follow up.   Our clinic's number is [phone]. Feel free to call any time with questions or concerns. We will answer any questions after hours with our 24-hour emergency line at that number as well.   - Dr. Bonner Puna

## 2016-01-26 NOTE — Assessment & Plan Note (Addendum)
Recently diagnosed (April 2017) related primarily to insulin resistance from morbid obesity. CBG checks at home have consistently been above goal (as expected from modest metformin regimen).  - Tolerating metformin at 500mg  BID. Will increase to 1000mg  qAM, 500mg  qPM.  - Rx CBG checks BID (fasting and with largest meal) - Continue MNT - Continue daily aerobic exercise as tolerated - Creatinine stable (1.4) in May, continue ARB (cough intolerance to lisinopril, tolerating cozaar) and check BMP today. May have to modify medications based on renal function.  - Continue ASA, crestor 5mg  (consider increase based on lipid panel). Check lipid panel.  - Expect recheck of Hb A1c on/after 02/17/2016

## 2016-01-26 NOTE — Assessment & Plan Note (Signed)
Uncontrolled without symptoms or severe range. Room to increase on losartan, metoprolol, and HCTZ. Control important in limiting worsening of diastolic dysfxn ([id number], EF 60-65% in April 2017) and CKD. Goal for him is < 135/85.  - Increase losartan (checking BMP), decrease HCTZ modestly as decreased renal function is limiting benefit and urinary frequency is an issue. Will Rx in combination pill to lower pill burden.  - Refilled metoprolol. HR can stand to increase this, but in absence of compelling indication (no CAD), could consider discontinuing this and trial norvasc to improve BP control.  - Refilled lasix (will recheck K with BMP today) - Continue home BP monitoring and bring records to follow up.

## 2016-01-27 LAB — BASIC METABOLIC PANEL WITH GFR
BUN: 17 mg/dL (ref 7–25)
CHLORIDE: 95 mmol/L — AB (ref 98–110)
CO2: 29 mmol/L (ref 20–31)
Calcium: 9 mg/dL (ref 8.6–10.3)
Creat: 1.21 mg/dL (ref 0.70–1.25)
GFR, EST NON AFRICAN AMERICAN: 61 mL/min (ref >=60)
GFR, Est African American: 70 mL/min (ref >=60)
Glucose, Bld: 163 mg/dL — ABNORMAL HIGH (ref 65–99)
POTASSIUM: 2.9 mmol/L — AB (ref 3.5–5.3)
SODIUM: 138 mmol/L (ref 135–146)

## 2016-01-27 LAB — LIPID PANEL
CHOL/HDL RATIO: 5.2 ratio — AB (ref <=5.0)
CHOLESTEROL: 151 mg/dL (ref 125–200)
HDL: 29 mg/dL — AB (ref >=40)
LDL CALC: 76 mg/dL (ref <130)
TRIGLYCERIDES: 228 mg/dL — AB (ref <150)
VLDL: 46 mg/dL — AB (ref <30)

## 2016-01-28 MED ORDER — ROSUVASTATIN CALCIUM 20 MG PO TABS: 20.0000 mg | ORAL_TABLET | Freq: Every day | ORAL | Status: DC

## 2016-01-28 MED ORDER — POTASSIUM CHLORIDE CRYS ER 20 MEQ PO TBCR: 20.0000 meq | EXTENDED_RELEASE_TABLET | Freq: Every day | ORAL | Status: DC

## 2016-01-28 NOTE — Addendum Note (Signed)
Addended by: Vance Gather B on: 01/28/2016 02:07 PM   Modules accepted: Orders

## 2016-01-28 NOTE — Progress Notes (Addendum)
Labs returned from this office visit showing:  - improved renal function (body mass suspected to contribute to creatinine elevation at baseline). Ok to continue diuretic.  - worsening hypokalemia due to lasix. Will Rx 48mEq daily of potassium and recheck in 2 weeks (this has been ordered as a future order as he was going to need to follow up for recheck of BP with tweaking of BP meds at last visit).  - Adverse lipid profile, particularly low HDL. Will augment statin intensity from crestor 5mg  > 20mg  daily. Can recheck in 6 weeks or at new PCP's discretion.  - The rationale for and details of these medication changes were discussed with the patient who voices understanding and will schedule follow up with new PCP.

## 2016-02-16 ENCOUNTER — Ambulatory Visit (INDEPENDENT_AMBULATORY_CARE_PROVIDER_SITE_OTHER): Payer: Commercial Managed Care - HMO | Admitting: Student in an Organized Health Care Education/Training Program

## 2016-02-16 ENCOUNTER — Encounter: Payer: Self-pay | Admitting: Student in an Organized Health Care Education/Training Program

## 2016-02-16 VITALS — BP 134/70 | HR 78 | Temp 98.3°F | Ht 73.0 in | Wt 352.6 lb

## 2016-02-16 DIAGNOSIS — E876 Hypokalemia: Secondary | ICD-10-CM

## 2016-02-16 DIAGNOSIS — Z0000 Encounter for general adult medical examination without abnormal findings: Secondary | ICD-10-CM | POA: Diagnosis not present

## 2016-02-16 DIAGNOSIS — E119 Type 2 diabetes mellitus without complications: Secondary | ICD-10-CM | POA: Diagnosis not present

## 2016-02-16 DIAGNOSIS — R479 Unspecified speech disturbances: Secondary | ICD-10-CM

## 2016-02-16 DIAGNOSIS — R490 Dysphonia: Secondary | ICD-10-CM | POA: Diagnosis not present

## 2016-02-16 DIAGNOSIS — I10 Essential (primary) hypertension: Secondary | ICD-10-CM

## 2016-02-16 LAB — POCT GLYCOSYLATED HEMOGLOBIN (HGB A1C): HEMOGLOBIN A1C: 7

## 2016-02-16 NOTE — Assessment & Plan Note (Addendum)
Improved and now controlled at 134/70, which is consistent with recent home BP readings. Control is important given history of diastolic heart failure (EF 60-65% 10/2015) - Continue current regimen with HCTZ/losartan and metoprolol.  Patient tolerating medications well. - HCTZ was decreased on previous visit due to hypokalemia and KDUR was started at that time. Will recheck BMP today.

## 2016-02-16 NOTE — Assessment & Plan Note (Signed)
Diagnosed April 2017, 2/2 insulin resistance from morbid obesity. -A1C improved at 7.0 today from 7.6 previously - CBG's reportedly above goal (200-205), however A1C at 7 indicates better control.  Will continue metformin 1000mg  qAM, 500 mg qPM.  - Patient asked to bring CBG journal to next visit. - Continue MNT, ASA, crestor, ARB as tolerated - Continue regular aerobic exercise - Check A1C ~05/19/16, consider rechecking BMP for renal and lipids at that time

## 2016-02-16 NOTE — Patient Instructions (Addendum)
It was a pleasure meeting you today in our clinic. Today we discussed your blood pressure and diabetes. Here is the treatment plan we have discussed and agreed upon together:  Blood Pressure - Your blood pressure is well-controlled today at 134/70.  Continue your current blood pressure regimen.   Diabetes Your diabetes is well controlled at today's visit, HbA1C is 7.0 Your goal is to have an A1c < 7.5 Medicine: Continue metformin 1000 mg in AM, 500 mg PM Homework: Take fasting blood sugar journal, take note of what you last ate if your blood sugar if you notice that your blood sugar is than usual.    Congratulations on your successful lifestyle changes! Your hard work with diet and exercise is paying off with improvements in your blood pressure and diabetes control. Come back to see [name] in: 3 months.   - Dr. Burr Medico

## 2016-02-16 NOTE — Progress Notes (Signed)
CC: diabetes and HTN follow up  HPI: [name] [name] is a 70 y.o. male who presents to [facility] today for follow up diabetes and hypertension management.   For his hypertension, he reports no challenges taking his medications as prescribed. -  Denies symptoms of high blood pressure including headache or vision changes, no chest pain or dyspnea.   - Notes when he takes blood pressure at home averages 135-140/80.   - Noted at his last visit to be hypokalemic at 2.9, Lasix was halved and KDUR was started at time. Denies muscle weakness.  For his diabetes, he reports tolerating metformin 1000mg  AM/500mg  PM with no difficulty.   - He records his blood sugars daily before or after breakfast and indicates they average 200-204.  He did not bring his CBG journal with him to this visit. - He continues to do aerobic exercise 3x weekly and follow with nutrition therapy out of his endocrinologist's office.     Patient is followed by ENT and asks for referral in order continue following up vocal cord paralysis.   Review of Symptoms: See HPI for ROS.   CC, SH/smoking status, and VS noted.  Objective: BP 134/70 mmHg  Pulse 78  Temp(Src) 98.3 F (36.8 C) (Oral)  Ht 6\' 1"  (1.854 m)  Wt 352 lb 9.6 oz (159.938 kg)  BMI 46.53 kg/m2 GEN: NAD, alert, cooperative, and pleasant. NECK: full ROM, no thyromegally RESPIRATORY: clear to auscultation bilaterally with no wheezes, rhonchi or rales, good effort CV: RRR, no m/r/g, no peripheral edema GI: soft, non-tender, non-distended, normoactive bowel sounds, no hepatosplenomegaly SKIN: warm and dry, no rashes or lesions, +multiple surgical scars on right lower extremity NEURO: peripheral sensation intact PSYCH: AAOx3, appropriate affect FOOT EXAM: WNL, documented  Assessment and plan:  T2DM (type 2 diabetes mellitus) ([place]) Diagnosed April 2017, 2/2 insulin resistance from morbid obesity. -A1C improved at 7.0 today from 7.6 previously - CBG's  reportedly above goal (200-205), however A1C at 7 indicates better control.  Will continue metformin 1000mg  qAM, 500 mg qPM.  - Patient asked to bring CBG journal to next visit. - Continue MNT, ASA, crestor, ARB as tolerated - Continue regular aerobic exercise - Check A1C ~05/19/16, consider rechecking BMP for renal and lipids at that time  Essential hypertension Improved and now controlled at 134/70, which is consistent with recent home BP readings. Control is important given history of diastolic heart failure (EF 60-65% 10/2015) - Continue current regimen with HCTZ/losartan and metoprolol.  Patient tolerating medications well. - HCTZ was decreased on previous visit due to hypokalemia and KDUR was started at that time. Will recheck BMP today.     Orders Placed This Encounter  Procedures  . Basic Metabolic Panel  . Ambulatory referral to Ophthalmology    Referral Priority:  Routine    Referral Type:  Consultation    Referral Reason:  Specialty Services Required    Requested Specialty:  Ophthalmology    Number of Visits Requested:  1  . Ambulatory referral to Gastroenterology    Referral Priority:  Routine    Referral Type:  Consultation    Referral Reason:  Specialty Services Required    Number of Visits Requested:  1  . Ambulatory referral to ENT    Referral Priority:  Routine    Referral Type:  Consultation    Referral Reason:  Specialty Services Required    Requested Specialty:  Otolaryngology    Number of Visits Requested:  1  . POCT A1C  No orders of the defined types were placed in this encounter.     Everrett Coombe, MD,MS,  PGY1 02/16/2016 5:11 PM

## 2016-02-17 ENCOUNTER — Ambulatory Visit: Payer: Self-pay | Admitting: Student in an Organized Health Care Education/Training Program

## 2016-02-17 ENCOUNTER — Encounter: Payer: Self-pay | Admitting: Student in an Organized Health Care Education/Training Program

## 2016-02-17 LAB — BASIC METABOLIC PANEL
BUN: 15 mg/dL (ref 7–25)
CALCIUM: 8.9 mg/dL (ref 8.6–10.3)
CO2: 24 mmol/L (ref 20–31)
CREATININE: 1.09 mg/dL (ref 0.70–1.25)
Chloride: 102 mmol/L (ref 98–110)
GLUCOSE: 129 mg/dL — AB (ref 65–99)
Potassium: 3.6 mmol/L (ref 3.5–5.3)
SODIUM: 139 mmol/L (ref 135–146)

## 2016-02-25 ENCOUNTER — Encounter: Payer: Self-pay | Admitting: *Deleted

## 2016-02-26 ENCOUNTER — Other Ambulatory Visit: Payer: Self-pay | Admitting: *Deleted

## 2016-02-26 ENCOUNTER — Encounter: Payer: Self-pay | Admitting: *Deleted

## 2016-02-26 NOTE — Patient Outreach (Signed)
[place] [facility]) Care Management  02/26/2016  EDDY [name] Aug 17, 1945 [id number]   I reached out to [name] [name] today by phone to follow up on his progress and on plans to visit with the ENT next week. He was in [place] visiting his son and family.   Plan: I will call [name] [name] next week after his ENT appointment.    [place] Management  [phone]

## 2016-02-29 DIAGNOSIS — R490 Dysphonia: Secondary | ICD-10-CM | POA: Diagnosis not present

## 2016-02-29 DIAGNOSIS — J383 Other diseases of vocal cords: Secondary | ICD-10-CM | POA: Diagnosis not present

## 2016-02-29 DIAGNOSIS — J3801 Paralysis of vocal cords and larynx, unilateral: Secondary | ICD-10-CM | POA: Diagnosis not present

## 2016-03-01 ENCOUNTER — Encounter: Payer: Self-pay | Admitting: *Deleted

## 2016-03-01 ENCOUNTER — Other Ambulatory Visit: Payer: Self-pay | Admitting: *Deleted

## 2016-03-01 NOTE — Patient Outreach (Signed)
[place]) Care Management  03/01/2016  DESIRE PEDDER 27-Nov-1945 [id number]   Unable to reach Mr. Lina by phone today. HIPPA compliant voice message left requesting return call.   Plan: I will reach out to Mr. Figge again by phone if I do not receive a return call from him this week.    [place] Management  [phone]

## 2016-03-03 ENCOUNTER — Telehealth: Payer: Self-pay | Admitting: Student in an Organized Health Care Education/Training Program

## 2016-03-03 ENCOUNTER — Other Ambulatory Visit: Payer: Self-pay | Admitting: Student in an Organized Health Care Education/Training Program

## 2016-03-03 MED ORDER — FUROSEMIDE 40 MG PO TABS: 40.0000 mg | ORAL_TABLET | Freq: Every day | ORAL | 0 refills | Status: DC

## 2016-03-03 NOTE — Telephone Encounter (Signed)
Pt says [facility] faxed [name] about his Lasix, he has been without it for 3 days. Please refill and call pt after it has been called in. Thanks! ep

## 2016-03-03 NOTE — Telephone Encounter (Signed)
This call note was opened in error. LF

## 2016-03-03 NOTE — Telephone Encounter (Signed)
Spoke with patient on the phone.  Let him know that his furosemide was refilled and he can pick it up at the pharmacy.  Everrett Coombe, MD

## 2016-03-03 NOTE — Telephone Encounter (Signed)
Spoke to pt. Told him the only GI Dr. I saw in his chart was Dr. Deatra Ina with [facility]. I told him [facility] is on [address] at [employer]. I gave him the number for them. Pt said he would call and see what they have to say. Ottis Stain, CMA

## 2016-03-03 NOTE — Telephone Encounter (Signed)
Pt stated he was referred to [facility] for a colonoscopy, pt said they wanted him to an interview and other things before they would do the colonoscopy. Pt does not want to do that, pt would like the referral switched to the place he went before. Pt said it was a glass building on [address]. Please advise. Thanks! ep

## 2016-03-04 ENCOUNTER — Other Ambulatory Visit: Payer: Self-pay | Admitting: *Deleted

## 2016-03-04 ENCOUNTER — Encounter: Payer: Self-pay | Admitting: *Deleted

## 2016-03-04 NOTE — Patient Outreach (Signed)
[facility]) Care Management  03/04/2016  SERVANDO BURGHART April 02, 1946 [id number]   Unable to reach Mr. Rupinski by phone today. HIPPA compliant voice message left requesting return call.   Plan: I will reach out to Mr. Klepacki by phone the week of 03/14/16.    [place] Management  [phone]

## 2016-03-09 DIAGNOSIS — J385 Laryngeal spasm: Secondary | ICD-10-CM | POA: Diagnosis not present

## 2016-03-09 DIAGNOSIS — J383 Other diseases of vocal cords: Secondary | ICD-10-CM | POA: Diagnosis not present

## 2016-03-09 DIAGNOSIS — J3801 Paralysis of vocal cords and larynx, unilateral: Secondary | ICD-10-CM | POA: Diagnosis not present

## 2016-03-09 DIAGNOSIS — R490 Dysphonia: Secondary | ICD-10-CM | POA: Diagnosis not present

## 2016-03-14 ENCOUNTER — Encounter: Payer: Commercial Managed Care - HMO | Attending: Family Medicine | Admitting: Nutrition

## 2016-03-14 VITALS — Ht 73.0 in | Wt 353.0 lb

## 2016-03-14 DIAGNOSIS — E118 Type 2 diabetes mellitus with unspecified complications: Secondary | ICD-10-CM

## 2016-03-14 DIAGNOSIS — E119 Type 2 diabetes mellitus without complications: Secondary | ICD-10-CM | POA: Insufficient documentation

## 2016-03-14 DIAGNOSIS — E669 Obesity, unspecified: Secondary | ICD-10-CM

## 2016-03-14 NOTE — Progress Notes (Signed)
  Medical Nutrition Therapy:  Appt start time: 0930 end time:  1000  Assessment:  Primary concerns today: Obesity, DM Type 2 and CHF. Changes: he is more aware of what he is eating, how much and when he is eating.. Just got off vacation and didn't gain any weight. He feels better overall.  FBS's still running around 200 mg/dl. NO recent A1C. He is working on eating more fresh fruits, vegetables and cutting down on processed foods and junk food and snacks. Working out at [employer] 2-3 times per week for 30 minutes. Willing to increase time of exercise for needed weight loss and and BS.   Metformin 1500 mg per day. Diet improving slowly. Needs more exercise .  He accidentally got his pills mixed up and started taking lisinopril again for 2-3 days and his cough has come back. He will check his pill box and take out Lisinopril and discard the pills.   Preferred Learning Style:   No preference indicated   Learning Readiness:     Ready  Change in progress   MEDICATIONS: See list   DIETARY INTAKE:    24-hr recall:   24-hr recall:  B ( AM): egg biscuit: or Cherrios or wheaties. Snk ( AM): none  L ( PM): 1 cup pasta,  Fish, orange and milk Snk ( PM):  D ( PM):  Salad, hamburger without bun, apple, 1% milk water Snk ( PM):  Beverages: water Eating healthier options at meal times. Drinking more water  Usual physical activity: [place] 2-3 times per week 30 minutes.  Estimated energy needs: 1800 calories 200 g carbohydrates 135 g protein 50 g fat  Progress Towards Goal(s):  In progress.   Nutritional Diagnosis:  NB-1.1 Food and nutrition-related knowledge deficit As related to Diabetes, CHF and obesity.  As evidenced by A1C 7.6%, BMI > 40 and .    Intervention:  Nutrition and Diabetes education provided on My Plate, CHO counting, meal planning, portion sizes, timing of meals, avoiding snacks between meals unless having a low blood sugar, target ranges for A1C and blood  sugars, signs/symptoms and treatment of hyper/hypoglycemia, monitoring blood sugars, taking medications as prescribed, benefits of exercising 30 minutes per day and prevention of complications of DM. Low sodium high fiber diet.Need for increased physical activity for weight loss.   Goals 1.  Increase more low carb vegetables.2 2. Exercise 45-60 Three times per werrek. 3. Lose 1-2 lbs per week. 4. Talk to MD about increasing dose of Metformin  To 2 000 mg per day and consider adding Jardiance. 5.. Lose 1-2 lbs per week.   Teaching Method Utilized:  Visual Auditory Hands on  Handouts given during visit include:  The Plate Method  Low Sodium Diet  Meal Plan Card  Barriers to learning/adherence to lifestyle change: None  Demonstrated degree of understanding via:  Teach Back   Monitoring/Evaluation:  Dietary intake, exercise, meal planning, and body weight in 1 month(s).   Recommend to give 2 g of Metformin per day and consider adding Jardiance for improved blood sugars, cardiovascular benefit and weight loss if his kidney function is ok.[name]   [name]

## 2016-03-14 NOTE — Patient Instructions (Signed)
Goals 1.  Increase more low carb vegetables.2 2. Exercise 45-60 Three times per werrek. 3. Lose 1-2 lbs per week. 4. Talk to MD about increasing dose of Metformin  To 2 000 mg per day and consider adding Jardiance. 5.. Lose 1-2 lbs per week. [name]

## 2016-03-15 ENCOUNTER — Encounter: Payer: Self-pay | Admitting: *Deleted

## 2016-03-15 ENCOUNTER — Other Ambulatory Visit: Payer: Self-pay | Admitting: *Deleted

## 2016-03-15 NOTE — Patient Outreach (Signed)
[place] [facility]) Care Management  03/15/2016  DRIN KISHIMOTO Apr 25, 1946 [id number]  Collaboration with RD, CDE Kieth Brightly Crumpton/primary care provider re: diabetes management:   Ms. Harriett Sine reached out to me after her visit with Mr. Utt to discuss ongoing diabetes management strategies. I forwarded her information and requests to the primary care provider.   Plan: I will follow up with Mr. Masten by phone.    [place] Management  [phone]

## 2016-03-18 ENCOUNTER — Encounter: Payer: Self-pay | Admitting: *Deleted

## 2016-03-18 ENCOUNTER — Other Ambulatory Visit: Payer: Self-pay | Admitting: *Deleted

## 2016-03-18 NOTE — Patient Outreach (Signed)
[facility]) Care Management  03/18/2016  KRAIG BONDI 1946/04/24 [id number]   I was unable to reach Mr. Moschella by phone today or to leave a message.   Plan: I will follow up with Mr. Fimbres by phone next week.    [place] Management  [phone]

## 2016-03-19 ENCOUNTER — Encounter (HOSPITAL_COMMUNITY): Payer: Self-pay | Admitting: Family Medicine

## 2016-03-19 ENCOUNTER — Ambulatory Visit (HOSPITAL_COMMUNITY)
Admission: EM | Admit: 2016-03-19 | Discharge: 2016-03-19 | Disposition: A | Discharge status: Home / Self Care | Payer: Commercial Managed Care - HMO | Attending: Internal Medicine | Admitting: Internal Medicine

## 2016-03-19 DIAGNOSIS — J209 Acute bronchitis, unspecified: Secondary | ICD-10-CM

## 2016-03-19 DIAGNOSIS — J069 Acute upper respiratory infection, unspecified: Secondary | ICD-10-CM | POA: Diagnosis not present

## 2016-03-19 MED ORDER — ALBUTEROL SULFATE (2.5 MG/3ML) 0.083% IN NEBU
5.0000 mg | INHALATION_SOLUTION | Freq: Once | RESPIRATORY_TRACT | Status: AC
  Administered 2016-03-19: 5 mg via RESPIRATORY_TRACT

## 2016-03-19 MED ORDER — ALBUTEROL SULFATE (2.5 MG/3ML) 0.083% IN NEBU
INHALATION_SOLUTION | RESPIRATORY_TRACT | Status: AC
  Filled 2016-03-19: qty 6

## 2016-03-19 MED ORDER — PREDNISONE 10 MG PO TABS: ORAL_TABLET | ORAL | 0 refills | Status: DC

## 2016-03-19 MED ORDER — ALBUTEROL SULFATE HFA 108 (90 BASE) MCG/ACT IN AERS: 1.0000 | INHALATION_SPRAY | Freq: Four times a day (QID) | RESPIRATORY_TRACT | 0 refills | Status: DC | PRN

## 2016-03-19 MED ORDER — AZITHROMYCIN 250 MG PO TABS: 250.0000 mg | ORAL_TABLET | Freq: Every day | ORAL | 0 refills | Status: DC
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
## 2016-03-19 NOTE — ED Triage Notes (Addendum)
Pt here for non productive cough with sore throat. sts he feels like razor blades are in his throat. sts using robitussin and chloraseptic spray. Pt hx of PNA multiple times with hospitalization. Pt throat red no swelling noted.

## 2016-03-22 ENCOUNTER — Other Ambulatory Visit: Payer: Self-pay | Admitting: *Deleted

## 2016-03-22 ENCOUNTER — Encounter: Payer: Self-pay | Admitting: *Deleted

## 2016-03-22 DIAGNOSIS — Z1211 Encounter for screening for malignant neoplasm of colon: Secondary | ICD-10-CM | POA: Diagnosis not present

## 2016-03-22 DIAGNOSIS — J209 Acute bronchitis, unspecified: Secondary | ICD-10-CM | POA: Insufficient documentation

## 2016-03-22 DIAGNOSIS — Z8371 Family history of colonic polyps: Secondary | ICD-10-CM | POA: Diagnosis not present
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
## 2016-03-23 ENCOUNTER — Ambulatory Visit: Payer: Self-pay | Admitting: *Deleted

## 2016-03-25 ENCOUNTER — Ambulatory Visit: Payer: Self-pay | Admitting: *Deleted

## 2016-03-28 ENCOUNTER — Encounter (INDEPENDENT_AMBULATORY_CARE_PROVIDER_SITE_OTHER): Payer: Commercial Managed Care - HMO | Admitting: Ophthalmology

## 2016-03-28 DIAGNOSIS — J383 Other diseases of vocal cords: Secondary | ICD-10-CM | POA: Diagnosis not present

## 2016-03-28 DIAGNOSIS — R490 Dysphonia: Secondary | ICD-10-CM | POA: Diagnosis not present

## 2016-03-28 DIAGNOSIS — J385 Laryngeal spasm: Secondary | ICD-10-CM | POA: Diagnosis not present

## 2016-03-28 DIAGNOSIS — J3801 Paralysis of vocal cords and larynx, unilateral: Secondary | ICD-10-CM | POA: Diagnosis not present

## 2016-03-29 ENCOUNTER — Encounter: Payer: Self-pay | Admitting: *Deleted

## 2016-03-29 ENCOUNTER — Other Ambulatory Visit: Payer: Self-pay | Admitting: *Deleted

## 2016-03-29 NOTE — Patient Outreach (Signed)
[facility]) Care Management  03/29/2016  [name] [name] Feb 10, 1946 [id number]   I spoke with [name] [name] by phone today. He states he is feeling better and his URI symptoms are resolved. He is able to verbalize excellent understanding of long term plan of care established by his providers for management of CHF and DM. [name] [name] is seeing the dietician regularly and attending provider appointments as scheduled. He doesn't feel he needs case management services at this time and wishes to discontinue.   Plan: I will close [name] [name]'s case. He has met all established goals. He has our contact information and can self refer any time in the future should he be in need of our services.    [place] Management  [phone]

## 2016-04-01 ENCOUNTER — Encounter: Payer: Self-pay | Admitting: Student in an Organized Health Care Education/Training Program

## 2016-04-06 ENCOUNTER — Telehealth: Payer: Self-pay | Admitting: Student in an Organized Health Care Education/Training Program

## 2016-04-06 ENCOUNTER — Other Ambulatory Visit: Payer: Self-pay | Admitting: *Deleted

## 2016-04-06 MED ORDER — FUROSEMIDE 40 MG PO TABS: 40.0000 mg | ORAL_TABLET | Freq: Every day | ORAL | 2 refills | Status: DC

## 2016-04-06 NOTE — Telephone Encounter (Signed)
Pt needs referral to [facility].  Appt is Tuesday Sept 12 at 8am.  Phone number is [phone]. Please call f

## 2016-04-08 ENCOUNTER — Other Ambulatory Visit: Payer: Self-pay | Admitting: Student in an Organized Health Care Education/Training Program

## 2016-04-08 DIAGNOSIS — E119 Type 2 diabetes mellitus without complications: Secondary | ICD-10-CM

## 2016-04-08 NOTE — Telephone Encounter (Signed)
Please call patient to inform him the referral was made.  Thank you Everrett Coombe, MD

## 2016-04-08 NOTE — Telephone Encounter (Signed)
LVM for pt to call. If he calls, please inform him that the referral has been made to [facility]. Ottis Stain, CMA

## 2016-04-12 ENCOUNTER — Encounter (INDEPENDENT_AMBULATORY_CARE_PROVIDER_SITE_OTHER): Payer: Commercial Managed Care - HMO | Admitting: Ophthalmology

## 2016-04-12 DIAGNOSIS — H43813 Vitreous degeneration, bilateral: Secondary | ICD-10-CM | POA: Diagnosis not present

## 2016-04-12 DIAGNOSIS — H35033 Hypertensive retinopathy, bilateral: Secondary | ICD-10-CM | POA: Diagnosis not present

## 2016-04-12 DIAGNOSIS — I10 Essential (primary) hypertension: Secondary | ICD-10-CM

## 2016-04-18 DIAGNOSIS — J383 Other diseases of vocal cords: Secondary | ICD-10-CM | POA: Diagnosis not present

## 2016-04-18 DIAGNOSIS — J385 Laryngeal spasm: Secondary | ICD-10-CM | POA: Diagnosis not present

## 2016-04-18 DIAGNOSIS — J3801 Paralysis of vocal cords and larynx, unilateral: Secondary | ICD-10-CM | POA: Diagnosis not present

## 2016-04-18 DIAGNOSIS — R490 Dysphonia: Secondary | ICD-10-CM | POA: Diagnosis not present

## 2016-04-19 DIAGNOSIS — J383 Other diseases of vocal cords: Secondary | ICD-10-CM | POA: Diagnosis not present

## 2016-04-19 DIAGNOSIS — J3801 Paralysis of vocal cords and larynx, unilateral: Secondary | ICD-10-CM | POA: Diagnosis not present

## 2016-04-19 DIAGNOSIS — R490 Dysphonia: Secondary | ICD-10-CM | POA: Diagnosis not present

## 2016-04-19 DIAGNOSIS — J385 Laryngeal spasm: Secondary | ICD-10-CM | POA: Diagnosis not present

## 2016-04-22 ENCOUNTER — Other Ambulatory Visit: Payer: Self-pay | Admitting: *Deleted

## 2016-04-22 DIAGNOSIS — E119 Type 2 diabetes mellitus without complications: Secondary | ICD-10-CM

## 2016-04-22 DIAGNOSIS — E876 Hypokalemia: Secondary | ICD-10-CM

## 2016-04-22 DIAGNOSIS — T502X5A Adverse effect of carbonic-anhydrase inhibitors, benzothiadiazides and other diuretics, initial encounter: Secondary | ICD-10-CM

## 2016-04-22 DIAGNOSIS — I10 Essential (primary) hypertension: Secondary | ICD-10-CM

## 2016-04-22 MED ORDER — LOSARTAN POTASSIUM-HCTZ 50-12.5 MG PO TABS: 1.0000 | ORAL_TABLET | Freq: Every day | ORAL | 0 refills | Status: DC

## 2016-04-22 MED ORDER — METOPROLOL SUCCINATE ER 25 MG PO TB24: 12.5000 mg | ORAL_TABLET | Freq: Every day | ORAL | 3 refills | Status: DC

## 2016-04-22 NOTE — Telephone Encounter (Signed)
Patient calling requesting refills on metoprolol, hyzaar, and potassium. Uses [employer]

## 2016-04-22 NOTE — Telephone Encounter (Signed)
Please call and let him know I refilled his metoprolol and hyzaar.  -  Please ask patient to come in for a lab-only appointment so we can recheck his potassium.    - He does not have to take potassium pills until we check his potassium, and I will hold off on refilling that prescription.  Everrett Coombe, MD

## 2016-04-26 ENCOUNTER — Other Ambulatory Visit: Payer: Self-pay | Admitting: Student in an Organized Health Care Education/Training Program

## 2016-04-26 DIAGNOSIS — E876 Hypokalemia: Secondary | ICD-10-CM

## 2016-04-26 NOTE — Telephone Encounter (Signed)
Contacted pt to give him the information from Dr. Burr Medico and I was going to schedule him a lab appointment but there was no order in to check his potassium.  Routing to PCP so she can place future orders in and once completed will contact pt to set up lab visit as well a a flu shot. Katharina Caper, Demitrios Molyneux D, [place]

## 2016-04-27 DIAGNOSIS — C61 Malignant neoplasm of prostate: Secondary | ICD-10-CM | POA: Diagnosis not present

## 2016-04-28 NOTE — Telephone Encounter (Signed)
Lab appt made for tomorrow. Fleeger, [name], CMA

## 2016-04-29 ENCOUNTER — Other Ambulatory Visit (INDEPENDENT_AMBULATORY_CARE_PROVIDER_SITE_OTHER): Payer: Commercial Managed Care - HMO

## 2016-04-29 DIAGNOSIS — Z23 Encounter for immunization: Secondary | ICD-10-CM | POA: Diagnosis not present

## 2016-04-29 DIAGNOSIS — E876 Hypokalemia: Secondary | ICD-10-CM

## 2016-04-29 LAB — BASIC METABOLIC PANEL WITH GFR
BUN: 19 mg/dL (ref 7–25)
CHLORIDE: 103 mmol/L (ref 98–110)
CO2: 28 mmol/L (ref 20–31)
CREATININE: 1.16 mg/dL (ref 0.70–1.25)
Calcium: 9 mg/dL (ref 8.6–10.3)
GFR, Est African American: 74 mL/min (ref >=60)
GFR, Est Non African American: 64 mL/min (ref >=60)
Glucose, Bld: 140 mg/dL — ABNORMAL HIGH (ref 65–99)
Potassium: 3.9 mmol/L (ref 3.5–5.3)
Sodium: 140 mmol/L (ref 135–146)

## 2016-05-03 ENCOUNTER — Encounter: Payer: Self-pay | Admitting: Student in an Organized Health Care Education/Training Program

## 2016-05-04 DIAGNOSIS — N200 Calculus of kidney: Secondary | ICD-10-CM | POA: Diagnosis not present

## 2016-05-04 DIAGNOSIS — R3121 Asymptomatic microscopic hematuria: Secondary | ICD-10-CM | POA: Diagnosis not present

## 2016-05-04 DIAGNOSIS — Z8546 Personal history of malignant neoplasm of prostate: Secondary | ICD-10-CM | POA: Diagnosis not present

## 2016-05-24 ENCOUNTER — Ambulatory Visit (INDEPENDENT_AMBULATORY_CARE_PROVIDER_SITE_OTHER): Payer: Commercial Managed Care - HMO | Admitting: Student

## 2016-05-24 ENCOUNTER — Encounter: Payer: Self-pay | Admitting: Student

## 2016-05-24 VITALS — BP 105/60 | HR 92 | Temp 98.2°F | Ht 73.0 in | Wt 357.8 lb

## 2016-05-24 DIAGNOSIS — B9789 Other viral agents as the cause of diseases classified elsewhere: Secondary | ICD-10-CM

## 2016-05-24 DIAGNOSIS — E119 Type 2 diabetes mellitus without complications: Secondary | ICD-10-CM

## 2016-05-24 DIAGNOSIS — J069 Acute upper respiratory infection, unspecified: Secondary | ICD-10-CM

## 2016-05-24 LAB — POCT GLYCOSYLATED HEMOGLOBIN (HGB A1C): Hemoglobin A1C: 7.1

## 2016-05-24 NOTE — Patient Instructions (Signed)
It appears that you have a viral upper respiratory infection (cold).  Cold symptoms can last up to 2 weeks.    - Get plenty of rest and adequate hydration. - Consume warm fluids (soup or tea) to provide relief for a stuffy nose and to loosen phlegm. - For nasal stuffiness, try saline nasal spray or a Neti Pot. - For sore throat pain relief: suck on throat lozenges, hard candy or popsicles; gargle with warm salt water (1/4 tsp. salt per 8 oz. of water); and eat soft, bland foods. - Eat a well-balanced diet. If you cannot, ensure you are getting enough nutrients by taking a daily multivitamin. - Avoid dairy products, as they can thicken phlegm. - Avoid alcohol, as it impairs your body's immune system.  CONTACT YOUR DOCTOR IF YOU EXPERIENCE ANY OF THE FOLLOWING: - High fever, chest pain, shortness of breath or  not able to keep down food or fluids.  - Cough that gets worse while other cold symptoms improve - Flare up of any chronic lung problem, such as asthma - Your symptoms persist longer than 2 weeks

## 2016-05-24 NOTE — Assessment & Plan Note (Signed)
History and exam suggestive for viral URI. Cardiopulmonary exam reassuring. Weight is up by 4 pounds over the last 3 months. However, patient without orthopnea, dyspnea, chest pain or edema to suggest fluid overload. -Discussed conservative management as in AVS. -Cussed return precautions (see AVS)

## 2016-05-24 NOTE — Progress Notes (Signed)
   Subjective:    Patient ID: [name] [name] is a 70 y.o. old male.  HPI #Sore throat: since last night. He was in his usual state of health until last night. Reports runny nose and congestion since this morning. Denies nausea, vomiting or diarrhea.  Reports sick contact. He was with his grandson with fever and cold over the weekend. Reports fatigue this morning.  Denies cough, fever, chills, shortness of breath, chest pain, edema, orthopnea or recent weight change.   PMH: reviewed  SH: denies smoking  Review of Systems Per HPI Objective:   Vitals:   05/24/16 1011  BP: 105/60  Pulse: 92  Temp: 98.2 F (36.8 C)  TempSrc: Oral  Weight: (!) 357 lb 12.8 oz (162.3 kg)  Height: 6\' 1"  (1.854 m)    GEN: appears obese, no apparent distress. Eyes: without conjunctival injection, sclera anicteric Nares: no rhinorrhea. Mild congestion or erythema Oropharynx: mmm without erythema or exudation. Had tonsillectomy CVS: RRR, normal s1 and s2, no murmurs, no edema RESP: no increased work of breathing, good air movement bilaterally, no crackles or wheeze HEM: Negative for cervical lymphadenopathy NEURO: alert and oriented appropriately, no gross defecits  PSYCH: appropriate mood and affect     Assessment & Plan:  Viral URI History and exam suggestive for viral URI. Cardiopulmonary exam reassuring. Weight is up by 4 pounds over the last 3 months. However, patient without orthopnea, dyspnea, chest pain or edema to suggest fluid overload. -Discussed conservative management as in AVS. -Cussed return precautions (see AVS)

## 2016-06-15 ENCOUNTER — Other Ambulatory Visit: Payer: Self-pay | Admitting: *Deleted

## 2016-06-15 DIAGNOSIS — E119 Type 2 diabetes mellitus without complications: Secondary | ICD-10-CM

## 2016-06-15 NOTE — Telephone Encounter (Signed)
Patient called and states that he requested a refill from his pharmacy last week for his metformin and now he is out.  I did inform him that I didn't see a previous request for this but that I would send it to his PCP to refill.  Jazmin Hartsell,CMA

## 2016-06-16 MED ORDER — METFORMIN HCL 500 MG PO TABS: ORAL_TABLET | ORAL | 2 refills | Status: DC

## 2016-07-01 ENCOUNTER — Telehealth: Payer: Self-pay | Admitting: Student in an Organized Health Care Education/Training Program

## 2016-07-01 NOTE — Telephone Encounter (Signed)
Pt would like a referral for a rheumatologist. Crippling arthritis runs in his family and his arthritis is giving him a fit. Please advise. Thanks! ep

## 2016-07-21 ENCOUNTER — Encounter: Payer: Self-pay | Admitting: Vascular Surgery

## 2016-07-22 NOTE — Telephone Encounter (Signed)
Pt called because he was checking on the status of his request for a referral to see a Rheumatologist. jw

## 2016-07-27 ENCOUNTER — Ambulatory Visit: Payer: Commercial Managed Care - HMO | Admitting: Vascular Surgery

## 2016-07-27 ENCOUNTER — Encounter (HOSPITAL_COMMUNITY): Payer: Commercial Managed Care - HMO

## 2016-08-05 ENCOUNTER — Other Ambulatory Visit: Payer: Self-pay | Admitting: *Deleted

## 2016-08-05 DIAGNOSIS — E119 Type 2 diabetes mellitus without complications: Secondary | ICD-10-CM

## 2016-08-05 DIAGNOSIS — I10 Essential (primary) hypertension: Secondary | ICD-10-CM

## 2016-08-05 MED ORDER — LOSARTAN POTASSIUM-HCTZ 50-12.5 MG PO TABS: 1.0000 | ORAL_TABLET | Freq: Every day | ORAL | 0 refills | Status: DC

## 2016-08-05 MED ORDER — FUROSEMIDE 40 MG PO TABS: 40.0000 mg | ORAL_TABLET | Freq: Every day | ORAL | 2 refills | Status: DC

## 2016-09-13 ENCOUNTER — Encounter: Payer: Self-pay | Admitting: Vascular Surgery

## 2016-09-16 DIAGNOSIS — K5730 Diverticulosis of large intestine without perforation or abscess without bleeding: Secondary | ICD-10-CM | POA: Diagnosis not present

## 2016-09-16 DIAGNOSIS — K635 Polyp of colon: Secondary | ICD-10-CM | POA: Diagnosis not present

## 2016-09-16 DIAGNOSIS — Z1211 Encounter for screening for malignant neoplasm of colon: Secondary | ICD-10-CM | POA: Diagnosis not present

## 2016-09-16 DIAGNOSIS — Z8371 Family history of colonic polyps: Secondary | ICD-10-CM | POA: Diagnosis not present

## 2016-09-20 ENCOUNTER — Other Ambulatory Visit: Payer: Self-pay | Admitting: Student in an Organized Health Care Education/Training Program

## 2016-09-20 ENCOUNTER — Telehealth: Payer: Self-pay | Admitting: Student in an Organized Health Care Education/Training Program

## 2016-09-20 DIAGNOSIS — Z1211 Encounter for screening for malignant neoplasm of colon: Secondary | ICD-10-CM | POA: Diagnosis not present

## 2016-09-20 DIAGNOSIS — K635 Polyp of colon: Secondary | ICD-10-CM | POA: Diagnosis not present

## 2016-09-20 DIAGNOSIS — I639 Cerebral infarction, unspecified: Secondary | ICD-10-CM

## 2016-09-20 NOTE — Telephone Encounter (Signed)
Called and spoke to the patient who states he already has established care with this vascular surgeon ([facility])  and this ophthalmologist ([facility]) after his stroke, however his insurance requires another referral. I will put in another referral for each of these.

## 2016-09-20 NOTE — Telephone Encounter (Signed)
Pt needs a referral to [facility], pt has an appointment tomorrow @ 10am. Pt also needs a referral to [facility] for eye exam after stroke appointment is 10-10-16. ep

## 2016-09-21 ENCOUNTER — Ambulatory Visit (INDEPENDENT_AMBULATORY_CARE_PROVIDER_SITE_OTHER): Payer: Medicare HMO | Admitting: Vascular Surgery

## 2016-09-21 ENCOUNTER — Ambulatory Visit (HOSPITAL_COMMUNITY)
Admission: RE | Admit: 2016-09-21 | Discharge: 2016-09-21 | Disposition: A | Discharge status: Home / Self Care | Payer: Medicare HMO | Source: Ambulatory Visit | Attending: Vascular Surgery | Admitting: Vascular Surgery

## 2016-09-21 ENCOUNTER — Encounter: Payer: Self-pay | Admitting: Vascular Surgery

## 2016-09-21 VITALS — BP 117/76 | HR 78 | Temp 97.9°F | Resp 20 | Ht 73.0 in | Wt 349.0 lb

## 2016-09-21 DIAGNOSIS — I6522 Occlusion and stenosis of left carotid artery: Secondary | ICD-10-CM | POA: Diagnosis not present

## 2016-09-21 LAB — VAS US CAROTID
LCCAPDIAS: 32 cm/s
LCCAPSYS: 137 cm/s
LEFT ECA DIAS: -22 cm/s
Left CCA dist dias: -35 cm/s
Left CCA dist sys: -146 cm/s
Left ICA prox dias: -40 cm/s
Left ICA prox sys: -120 cm/s
RCCAPDIAS: 19 cm/s
RIGHT CCA MID DIAS: 19 cm/s
RIGHT ECA DIAS: -13 cm/s
RIGHT VERTEBRAL DIAS: 6 cm/s
Right CCA prox sys: 121 cm/s
Right cca dist sys: -112 cm/s

## 2016-09-21 NOTE — Telephone Encounter (Signed)
Patient has [employer]. Effective Jan. 1, 2018, [employer] no longer will require referrals for patients with [facility].
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
## 2016-09-22 ENCOUNTER — Encounter: Payer: Self-pay | Admitting: Cardiology

## 2016-09-23 ENCOUNTER — Encounter: Payer: Self-pay | Admitting: Cardiology

## 2016-09-26 NOTE — Addendum Note (Signed)
Addended by: Lianne Cure A on: 09/26/2016 04:00 PM   Modules accepted: Orders

## 2016-10-04 ENCOUNTER — Other Ambulatory Visit: Payer: Self-pay | Admitting: *Deleted

## 2016-10-04 DIAGNOSIS — E119 Type 2 diabetes mellitus without complications: Secondary | ICD-10-CM

## 2016-10-04 MED ORDER — METFORMIN HCL 500 MG PO TABS: ORAL_TABLET | ORAL | 2 refills | Status: DC

## 2016-10-14 DIAGNOSIS — L814 Other melanin hyperpigmentation: Secondary | ICD-10-CM | POA: Diagnosis not present

## 2016-10-14 DIAGNOSIS — D2362 Other benign neoplasm of skin of left upper limb, including shoulder: Secondary | ICD-10-CM | POA: Diagnosis not present

## 2016-10-14 DIAGNOSIS — D1801 Hemangioma of skin and subcutaneous tissue: Secondary | ICD-10-CM | POA: Diagnosis not present

## 2016-10-14 DIAGNOSIS — L570 Actinic keratosis: Secondary | ICD-10-CM | POA: Diagnosis not present

## 2016-10-14 DIAGNOSIS — L820 Inflamed seborrheic keratosis: Secondary | ICD-10-CM | POA: Diagnosis not present

## 2016-10-14 DIAGNOSIS — L821 Other seborrheic keratosis: Secondary | ICD-10-CM | POA: Diagnosis not present

## 2016-10-26 DIAGNOSIS — H26491 Other secondary cataract, right eye: Secondary | ICD-10-CM | POA: Diagnosis not present

## 2016-10-26 DIAGNOSIS — Z961 Presence of intraocular lens: Secondary | ICD-10-CM | POA: Diagnosis not present

## 2016-10-26 DIAGNOSIS — E119 Type 2 diabetes mellitus without complications: Secondary | ICD-10-CM | POA: Diagnosis not present

## 2016-10-26 LAB — HM DIABETES EYE EXAM

## 2016-11-03 ENCOUNTER — Ambulatory Visit (INDEPENDENT_AMBULATORY_CARE_PROVIDER_SITE_OTHER): Payer: Medicare HMO | Admitting: Student in an Organized Health Care Education/Training Program

## 2016-11-03 ENCOUNTER — Encounter: Payer: Self-pay | Admitting: Student in an Organized Health Care Education/Training Program

## 2016-11-03 VITALS — BP 124/70 | HR 76 | Temp 97.8°F | Ht 73.0 in | Wt 352.0 lb

## 2016-11-03 DIAGNOSIS — E669 Obesity, unspecified: Secondary | ICD-10-CM

## 2016-11-03 DIAGNOSIS — Z6841 Body Mass Index (BMI) 40.0 and over, adult: Secondary | ICD-10-CM

## 2016-11-03 DIAGNOSIS — E119 Type 2 diabetes mellitus without complications: Secondary | ICD-10-CM

## 2016-11-03 DIAGNOSIS — I10 Essential (primary) hypertension: Secondary | ICD-10-CM

## 2016-11-03 DIAGNOSIS — IMO0001 Reserved for inherently not codable concepts without codable children: Secondary | ICD-10-CM

## 2016-11-03 DIAGNOSIS — Z8546 Personal history of malignant neoplasm of prostate: Secondary | ICD-10-CM | POA: Diagnosis not present

## 2016-11-03 LAB — POCT GLYCOSYLATED HEMOGLOBIN (HGB A1C): HEMOGLOBIN A1C: 9.5

## 2016-11-03 MED ORDER — METFORMIN HCL 500 MG PO TABS: ORAL_TABLET | ORAL | 4 refills | Status: DC

## 2016-11-03 NOTE — Assessment & Plan Note (Signed)
Metformin 1000 mg BID (increased PM dose) - discussed strategies for remembering PM dose - recheck A1c 3 months

## 2016-11-03 NOTE — Assessment & Plan Note (Signed)
Controlled, continue current management with Toprol, losartan/hctz and lasix

## 2016-11-03 NOTE — Assessment & Plan Note (Signed)
Patient is extremely motivated to lose weight, very interested in bariatric surgery - will call Dr. Foster Simpson office (Obesity specialist) to see if she is accepting patients - will call [facility] surgery specialists to see if this patient is a possible candidate - unsure if his age and past medical history would proclude him from surgery - patient given information on [email or web address] seminars on bariatric surgery to gather information - if no availability with obesity specialist will have patient come back in to set up diet goals and monthly visits (consider using DMDIET .phrase to start)

## 2016-11-03 NOTE — Progress Notes (Signed)
CC: Follow up  HPI: [name] [name] is a 71 y.o. male   HYPERTENSION Blood pressure range- 120s/70s at home  Chest pain, palpitations - None       Dyspnea- None Medications: Losartan HCTZ 50-12.5, Toprol XL 12.5 mg Qd, Furosemide 40 mg Qd,  Compliance- nearly 100%  Lightheadedness,Syncope- None    Edema- none  DIABETES Disease Monitoring: HbA1c worsened at 9.5 Polyuria/phagia/dipsia- None       Visual problems- None - Dr. Katy Fitch ophthalmology Medications: 1000 mg AM, 500 mg PM (most often misses PM dose) Compliance- Poor - patient reports he has had difficulty taking his PM metformin   Hypoglycemic symptoms- HbA1c elevated at 9.2 today  OBESITY Patient endorses he has spent money trying "every diet" and he has still had difficulty losing weight. He endorses great interest in bariatric surgery. He feels this would be beneficial for all of his medical issues. He endorses willingness to work with either [facility] or an obesity specialist to document attempted weight loss with dietary goals and exercise goals.   Monitoring Labs and Parameters Last A1C:  Lab Results  Component Value Date   HGBA1C 9.5 11/03/2016    Last Lipid:     Component Value Date/Time   CHOL 151 01/26/2016 1111   HDL 29 (L) 01/26/2016 1111   LDLDIRECT 148 (H) 11/15/2007 1709    Last Bmet  Potassium  Date Value Ref Range Status  04/29/2016 3.9 3.5 - 5.3 mmol/L Final   Sodium  Date Value Ref Range Status  04/29/2016 140 135 - 146 mmol/L Final   Creat  Date Value Ref Range Status  04/29/2016 1.16 0.70 - 1.25 mg/dL Final    Comment:      For patients > or = 70 years of age: The upper reference limit for Creatinine is approximately 13% higher for people identified as African-American.         Last BPs:  BP Readings from Last 3 Encounters:  11/03/16 124/70  09/21/16 117/76  05/24/16 105/60    Review of Symptoms:  See HPI for ROS.   CC, SH/smoking status, and VS noted.  Objective: BP  124/70   Pulse 76   Temp 97.8 F (36.6 C) (Oral)   Ht 6\' 1"  (1.854 m)   Wt (!) 159.7 kg (352 lb)   SpO2 99%   BMI 46.44 kg/m  GEN: NAD, alert, cooperative, and pleasant. NECK: full ROM, no thyromegaly RESPIRATORY: clear to auscultation bilaterally with no wheezes, rhonchi or rales, good effort CV: RRR, no m/r/g, no peripheral edema GI: soft, non-tender, non-distended, obese SKIN: warm and dry, no rashes or lesions  Assessment and plan:  Obesity Patient is extremely motivated to lose weight, very interested in bariatric surgery - will call Dr. Foster Simpson office (Obesity specialist) to see if she is accepting patients - will call [facility] to see if this patient is a possible candidate - unsure if his age and past medical history would proclude him from surgery - patient given information on [email or web address] seminars on bariatric surgery to gather information - if no availability with obesity specialist will have patient come back in to set up diet goals and monthly visits (consider using DMDIET .phrase to start)   T2DM (type 2 diabetes mellitus) (HCC) Metformin 1000 mg BID (increased PM dose) - discussed strategies for remembering PM dose - recheck A1c 3 months  Essential hypertension Controlled, continue current management with Toprol, losartan/hctz and lasix   Orders Placed This Encounter  Procedures  . Ambulatory referral to Urology    Referral Priority:   Routine    Referral Type:   Consultation    Referral Reason:   Specialty Services Required    Requested Specialty:   Urology    Number of Visits Requested:   1  . POCT HgB A1C (CPT 83036)    Meds ordered this encounter  Medications  . metFORMIN (GLUCOPHAGE) 500 MG tablet    Sig: 1000 mg in the morning and 1000 mg in the evening.    Dispense:  120 tablet    Refill:  [place], MD,MS,  PGY1 11/03/2016 5:41 PM

## 2016-11-03 NOTE — Patient Instructions (Signed)
It was a pleasure seeing you today in our clinic. Today we discussed bariatric surgery. Here is the treatment plan we have discussed and agreed upon together:  I will reach out to the bariatric surgeon to see if you would be a candidate.  I will reach out to the weight specialist to see if she is accepting patients, and I will call you to determine whether you should come back to my office or go to see her so that we can begin to document your weight loss goals for the next 6 months.  Please go to  [email or web address] and search "bariatric surgery" For more information on this surgery.  Diabetes Your diabetes is poorly controlled Your goal is to have an A1c < 7.5 Medicine Changes: Please take 1000 mg metformin in the morning and in the evening every day Homework: Try to put your pill bottle by the tooth brush to remember your PM dose  Call us if: You have any questions.   Our clinic's number is [phone]. Please call with questions or concerns about what we discussed today.  Be well, Dr. Burr Medico

## 2016-11-04 DIAGNOSIS — Z8546 Personal history of malignant neoplasm of prostate: Secondary | ICD-10-CM | POA: Diagnosis not present

## 2016-11-08 ENCOUNTER — Other Ambulatory Visit: Payer: Self-pay | Admitting: Student in an Organized Health Care Education/Training Program

## 2016-11-08 DIAGNOSIS — IMO0001 Reserved for inherently not codable concepts without codable children: Secondary | ICD-10-CM

## 2016-11-08 NOTE — Progress Notes (Signed)
Spoke with Patient on the phone.  Patient interested in medical weight management and/or bariatric surgery. Requires at least 6 months of documented attempted weight loss if pursues bariatric surgery (uncertain that he would be a good candidate given age and PMH)  Placed referral for Dr. Redgie Grayer with [facility] in [place]. Patient would attend 1.5 hour information session (there is a waiting list, per staff at that office anticipate patient may go to an info session in May) and if accepted as a patient in that office would be followed potentially every 2 weeks- every 3 weeks for lifestyle modifications and weight loss long term.  Patient is very motivated to lose weight, he feels this is a very good starting point. Referral placed.  [name] [name]

## 2016-11-11 DIAGNOSIS — N5201 Erectile dysfunction due to arterial insufficiency: Secondary | ICD-10-CM | POA: Diagnosis not present

## 2016-11-11 DIAGNOSIS — Z8546 Personal history of malignant neoplasm of prostate: Secondary | ICD-10-CM | POA: Diagnosis not present

## 2016-11-16 ENCOUNTER — Other Ambulatory Visit: Payer: Self-pay | Admitting: *Deleted

## 2016-11-16 DIAGNOSIS — E119 Type 2 diabetes mellitus without complications: Secondary | ICD-10-CM

## 2016-11-16 DIAGNOSIS — E7800 Pure hypercholesterolemia, unspecified: Secondary | ICD-10-CM

## 2016-11-16 DIAGNOSIS — I10 Essential (primary) hypertension: Secondary | ICD-10-CM

## 2016-11-16 MED ORDER — ROSUVASTATIN CALCIUM 20 MG PO TABS: 20.0000 mg | ORAL_TABLET | Freq: Every day | ORAL | 0 refills | Status: DC

## 2016-11-16 MED ORDER — LOSARTAN POTASSIUM-HCTZ 50-12.5 MG PO TABS
1.0000 | ORAL_TABLET | Freq: Every day | ORAL | 0 refills | Status: DC
Start: 2016-11-16 — End: 2017-09-05

## 2016-11-16 MED ORDER — FUROSEMIDE 40 MG PO TABS: 40.0000 mg | ORAL_TABLET | Freq: Every day | ORAL | 0 refills | Status: DC

## 2016-11-21 ENCOUNTER — Encounter: Payer: Self-pay | Admitting: Student in an Organized Health Care Education/Training Program

## 2016-12-15 ENCOUNTER — Encounter (INDEPENDENT_AMBULATORY_CARE_PROVIDER_SITE_OTHER): Payer: Commercial Managed Care - HMO | Admitting: Family Medicine

## 2016-12-19 ENCOUNTER — Encounter (INDEPENDENT_AMBULATORY_CARE_PROVIDER_SITE_OTHER): Payer: Self-pay | Admitting: Family Medicine

## 2016-12-28 ENCOUNTER — Encounter (INDEPENDENT_AMBULATORY_CARE_PROVIDER_SITE_OTHER): Payer: Self-pay | Admitting: Family Medicine

## 2016-12-28 ENCOUNTER — Ambulatory Visit (INDEPENDENT_AMBULATORY_CARE_PROVIDER_SITE_OTHER): Payer: Medicare HMO | Admitting: Family Medicine

## 2016-12-28 VITALS — BP 138/73 | HR 69 | Temp 98.4°F | Resp 12 | Ht 73.0 in | Wt 340.0 lb

## 2016-12-28 DIAGNOSIS — Z6841 Body Mass Index (BMI) 40.0 and over, adult: Secondary | ICD-10-CM | POA: Diagnosis not present

## 2016-12-28 DIAGNOSIS — E119 Type 2 diabetes mellitus without complications: Secondary | ICD-10-CM | POA: Diagnosis not present

## 2016-12-28 DIAGNOSIS — IMO0001 Reserved for inherently not codable concepts without codable children: Secondary | ICD-10-CM

## 2016-12-28 DIAGNOSIS — I509 Heart failure, unspecified: Secondary | ICD-10-CM | POA: Diagnosis not present

## 2016-12-28 DIAGNOSIS — E785 Hyperlipidemia, unspecified: Secondary | ICD-10-CM | POA: Diagnosis not present

## 2016-12-28 DIAGNOSIS — Z0289 Encounter for other administrative examinations: Secondary | ICD-10-CM

## 2016-12-28 DIAGNOSIS — I10 Essential (primary) hypertension: Secondary | ICD-10-CM | POA: Diagnosis not present

## 2016-12-28 DIAGNOSIS — Z9189 Other specified personal risk factors, not elsewhere classified: Secondary | ICD-10-CM

## 2016-12-28 DIAGNOSIS — E559 Vitamin D deficiency, unspecified: Secondary | ICD-10-CM

## 2016-12-28 DIAGNOSIS — Z1331 Encounter for screening for depression: Secondary | ICD-10-CM

## 2016-12-28 DIAGNOSIS — R5383 Other fatigue: Secondary | ICD-10-CM | POA: Diagnosis not present

## 2016-12-28 DIAGNOSIS — Z1389 Encounter for screening for other disorder: Secondary | ICD-10-CM | POA: Diagnosis not present

## 2016-12-28 DIAGNOSIS — R0602 Shortness of breath: Secondary | ICD-10-CM
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
## 2016-12-29 LAB — CBC WITH DIFFERENTIAL/PLATELET
BASOS ABS: 0 10*3/uL (ref 0.0–0.2)
Basos: 0 %
EOS (ABSOLUTE): 0.6 10*3/uL — ABNORMAL HIGH (ref 0.0–0.4)
Eos: 7 %
HEMOGLOBIN: 16 g/dL (ref 13.0–17.7)
Hematocrit: 47.3 % (ref 37.5–51.0)
IMMATURE GRANS (ABS): 0 10*3/uL (ref 0.0–0.1)
IMMATURE GRANULOCYTES: 0 %
LYMPHS: 15 %
Lymphocytes Absolute: 1.2 10*3/uL (ref 0.7–3.1)
MCH: 28.7 pg (ref 26.6–33.0)
MCHC: 33.8 g/dL (ref 31.5–35.7)
MCV: 85 fL (ref 79–97)
MONOCYTES: 7 %
Monocytes Absolute: 0.5 10*3/uL (ref 0.1–0.9)
Neutrophils Absolute: 5.8 10*3/uL (ref 1.4–7.0)
Neutrophils: 71 %
Platelets: 262 10*3/uL (ref 150–379)
RBC: 5.57 x10E6/uL (ref 4.14–5.80)
RDW: 15.2 % (ref 12.3–15.4)
WBC: 8.2 10*3/uL (ref 3.4–10.8)

## 2016-12-29 LAB — LIPID PANEL WITH LDL/HDL RATIO
CHOLESTEROL TOTAL: 146 mg/dL (ref 100–199)
HDL: 30 mg/dL — ABNORMAL LOW (ref >39)
LDL Calculated: 69 mg/dL (ref 0–99)
LDL/HDL RATIO: 2.3 ratio (ref 0.0–3.6)
TRIGLYCERIDES: 234 mg/dL — AB (ref 0–149)
VLDL Cholesterol Cal: 47 mg/dL — ABNORMAL HIGH (ref 5–40)

## 2016-12-29 LAB — HEMOGLOBIN A1C
ESTIMATED AVERAGE GLUCOSE: 235 mg/dL
HEMOGLOBIN A1C: 9.8 % — AB (ref 4.8–5.6)

## 2016-12-29 LAB — INSULIN, RANDOM: INSULIN: 22.9 u[IU]/mL (ref 2.6–24.9)

## 2016-12-29 LAB — COMPREHENSIVE METABOLIC PANEL
ALK PHOS: 75 IU/L (ref 39–117)
ALT: 25 IU/L (ref 0–44)
AST: 18 IU/L (ref 0–40)
Albumin/Globulin Ratio: 2 (ref 1.2–2.2)
Albumin: 4.4 g/dL (ref 3.5–4.8)
BILIRUBIN TOTAL: 0.5 mg/dL (ref 0.0–1.2)
BUN/Creatinine Ratio: 14 (ref 10–24)
BUN: 16 mg/dL (ref 8–27)
CHLORIDE: 96 mmol/L (ref 96–106)
CO2: 24 mmol/L (ref 18–29)
CREATININE: 1.11 mg/dL (ref 0.76–1.27)
Calcium: 9.1 mg/dL (ref 8.6–10.2)
GFR calc Af Amer: 77 mL/min/{1.73_m2} (ref >59)
GFR calc non Af Amer: 67 mL/min/{1.73_m2} (ref >59)
GLUCOSE: 209 mg/dL — AB (ref 65–99)
Globulin, Total: 2.2 g/dL (ref 1.5–4.5)
Potassium: 3.8 mmol/L (ref 3.5–5.2)
Sodium: 137 mmol/L (ref 134–144)
Total Protein: 6.6 g/dL (ref 6.0–8.5)

## 2016-12-29 LAB — TSH: TSH: 1.04 u[IU]/mL (ref 0.450–4.500)

## 2016-12-29 LAB — T3: T3 TOTAL: 102 ng/dL (ref 71–180)

## 2016-12-29 LAB — T4, FREE: FREE T4: 1.36 ng/dL (ref 0.82–1.77)

## 2016-12-29 LAB — MICROALBUMIN / CREATININE URINE RATIO: Creatinine, Urine: 17.6 mg/dL

## 2016-12-29 LAB — VITAMIN D 25 HYDROXY (VIT D DEFICIENCY, FRACTURES): Vit D, 25-Hydroxy: 39.8 ng/mL (ref 30.0–100.0)

## 2017-01-09 ENCOUNTER — Ambulatory Visit (INDEPENDENT_AMBULATORY_CARE_PROVIDER_SITE_OTHER): Payer: Medicare HMO | Admitting: Student

## 2017-01-09 ENCOUNTER — Encounter: Payer: Self-pay | Admitting: Student

## 2017-01-09 VITALS — BP 110/75 | HR 77 | Temp 98.0°F | Ht 73.0 in | Wt 336.8 lb

## 2017-01-09 DIAGNOSIS — R059 Cough, unspecified: Secondary | ICD-10-CM

## 2017-01-09 DIAGNOSIS — R05 Cough: Secondary | ICD-10-CM

## 2017-01-09 MED ORDER — ALBUTEROL SULFATE HFA 108 (90 BASE) MCG/ACT IN AERS: 2.0000 | INHALATION_SPRAY | Freq: Four times a day (QID) | RESPIRATORY_TRACT | 0 refills | Status: DC | PRN

## 2017-01-09 NOTE — Patient Instructions (Signed)
It was great seeing you today! We have addressed the following issues today 1. It appears that you have a viral upper respiratory infection (Common Cold).  Cold symptoms can last up to 2 weeks.   - I sent a prescription for albuterol inhaler to your pharmacy. You can use this as needed for cough, shortness of breathor wheezing - Get plenty of rest and adequate hydration. - Consume warm fluids (soup or tea) to provide relief for a stuffy nose and to loosen phlegm. - For nasal stuffiness, try saline nasal spray or a Neti Pot. Eating warm liquids such as chicken soup or tea may also help with nasal congestion. - For sore throat pain relief: suck on throat lozenges, hard candy or popsicles; gargle with warm salt water (1/4 tsp. salt per 8 oz. of water); and eat soft, bland foods. - Eat a well-balanced diet. If you cannot, ensure you are getting enough nutrients by taking a daily multivitamin. - Avoid dairy products, as they can thicken phlegm. - Avoid alcohol, as it impairs your body's immune system.  CONTACT YOUR DOCTOR IF YOU EXPERIENCE ANY OF THE FOLLOWING: - High fever, chest pain, shortness of breath or  not able to keep down food or fluids.  - Cough that gets worse while other cold symptoms improve - Flare up of any chronic lung problem, such as asthma - Your symptoms persist longer than 2 weeks   If we did any lab work today, and the results require attention, either me or my nurse will get in touch with you. If everything is normal, you will get a letter in mail and a message via . If you don't hear from [name] in two weeks, please give [name] a call. Otherwise, we look forward to seeing you again at your next visit. If you have any questions or concerns before then, please call the clinic at [phone].  Please bring all your medications to every doctors visit  Sign up for My Chart to have easy access to your labs results, and communication with your Primary care physician.    Please  check-out at the front desk before leaving the clinic.    Take Care,   Dr. Cyndia Skeeters
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
## 2017-01-11 ENCOUNTER — Ambulatory Visit (INDEPENDENT_AMBULATORY_CARE_PROVIDER_SITE_OTHER): Payer: Medicare HMO | Admitting: Family Medicine

## 2017-01-11 VITALS — BP 120/70 | HR 64 | Temp 98.3°F | Ht 73.0 in | Wt 331.0 lb

## 2017-01-11 DIAGNOSIS — Z6841 Body Mass Index (BMI) 40.0 and over, adult: Secondary | ICD-10-CM

## 2017-01-11 DIAGNOSIS — E559 Vitamin D deficiency, unspecified: Secondary | ICD-10-CM

## 2017-01-11 DIAGNOSIS — E119 Type 2 diabetes mellitus without complications: Secondary | ICD-10-CM | POA: Diagnosis not present

## 2017-01-11 DIAGNOSIS — IMO0001 Reserved for inherently not codable concepts without codable children: Secondary | ICD-10-CM

## 2017-01-11 MED ORDER — VITAMIN D (ERGOCALCIFEROL) 1.25 MG (50000 UNIT) PO CAPS: 50000.0000 [IU] | ORAL_CAPSULE | ORAL | 0 refills | Status: DC
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
## 2017-01-18 ENCOUNTER — Ambulatory Visit (INDEPENDENT_AMBULATORY_CARE_PROVIDER_SITE_OTHER): Payer: Self-pay | Admitting: Family Medicine

## 2017-01-23 ENCOUNTER — Ambulatory Visit (INDEPENDENT_AMBULATORY_CARE_PROVIDER_SITE_OTHER): Payer: Medicare HMO | Admitting: Family Medicine

## 2017-01-23 VITALS — BP 131/75 | HR 65 | Temp 98.1°F | Ht 73.0 in | Wt 326.0 lb

## 2017-01-23 DIAGNOSIS — E119 Type 2 diabetes mellitus without complications: Secondary | ICD-10-CM | POA: Diagnosis not present

## 2017-01-23 DIAGNOSIS — Z6841 Body Mass Index (BMI) 40.0 and over, adult: Secondary | ICD-10-CM
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
## 2017-02-13 ENCOUNTER — Other Ambulatory Visit: Payer: Self-pay | Admitting: *Deleted

## 2017-02-13 ENCOUNTER — Ambulatory Visit (INDEPENDENT_AMBULATORY_CARE_PROVIDER_SITE_OTHER): Payer: Medicare HMO | Admitting: Family Medicine

## 2017-02-13 VITALS — BP 121/73 | HR 73 | Temp 97.8°F | Ht 73.0 in | Wt 318.0 lb

## 2017-02-13 DIAGNOSIS — Z6841 Body Mass Index (BMI) 40.0 and over, adult: Secondary | ICD-10-CM

## 2017-02-13 DIAGNOSIS — E669 Obesity, unspecified: Secondary | ICD-10-CM | POA: Diagnosis not present

## 2017-02-13 DIAGNOSIS — IMO0001 Reserved for inherently not codable concepts without codable children: Secondary | ICD-10-CM

## 2017-02-13 DIAGNOSIS — E559 Vitamin D deficiency, unspecified: Secondary | ICD-10-CM

## 2017-02-13 DIAGNOSIS — Z9189 Other specified personal risk factors, not elsewhere classified: Secondary | ICD-10-CM | POA: Diagnosis not present

## 2017-02-13 MED ORDER — ACCU-CHEK SOFTCLIX LANCET DEV MISC: 3 refills | Status: AC

## 2017-02-13 MED ORDER — VITAMIN D (ERGOCALCIFEROL) 1.25 MG (50000 UNIT) PO CAPS: 50000.0000 [IU] | ORAL_CAPSULE | ORAL | 0 refills | Status: DC

## 2017-02-13 MED ORDER — GLUCOSE BLOOD VI STRP: ORAL_STRIP | 12 refills | Status: DC
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
## 2017-02-14 ENCOUNTER — Telehealth: Payer: Self-pay | Admitting: Student in an Organized Health Care Education/Training Program

## 2017-02-14 NOTE — Telephone Encounter (Signed)
LVM: nonurgent call, just checking in on weight loss and lifestyle modifications, calling to congratulate patient on progress so far.

## 2017-03-02 ENCOUNTER — Ambulatory Visit (INDEPENDENT_AMBULATORY_CARE_PROVIDER_SITE_OTHER): Payer: Medicare HMO | Admitting: Family Medicine

## 2017-03-02 VITALS — BP 128/61 | HR 72 | Temp 98.0°F | Ht 73.0 in | Wt 320.0 lb

## 2017-03-02 DIAGNOSIS — E669 Obesity, unspecified: Secondary | ICD-10-CM | POA: Diagnosis not present

## 2017-03-02 DIAGNOSIS — Z6841 Body Mass Index (BMI) 40.0 and over, adult: Secondary | ICD-10-CM

## 2017-03-02 DIAGNOSIS — E119 Type 2 diabetes mellitus without complications: Secondary | ICD-10-CM

## 2017-03-02 DIAGNOSIS — IMO0001 Reserved for inherently not codable concepts without codable children: Secondary | ICD-10-CM

## 2017-03-02 DIAGNOSIS — E559 Vitamin D deficiency, unspecified: Secondary | ICD-10-CM | POA: Diagnosis not present

## 2017-03-02 MED ORDER — VITAMIN D (ERGOCALCIFEROL) 1.25 MG (50000 UNIT) PO CAPS: 50000.0000 [IU] | ORAL_CAPSULE | ORAL | 0 refills | Status: DC
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
## 2017-03-09 ENCOUNTER — Other Ambulatory Visit: Payer: Self-pay | Admitting: Pharmacy Technician

## 2017-03-09 NOTE — Patient Outreach (Signed)
[facility]) Care Management  03/09/2017  [name] [name] May 16, 1946 [id number]   [place] verified the last time patient had Metformin 500 filled was 01/24/17 for a 30 days supply.  Tried to contact patient regarding Metformin medication adherence, no answer, no voicemail.  Maud Deed [place], [place] [facility] [phone]

## 2017-03-10 ENCOUNTER — Other Ambulatory Visit: Payer: Self-pay | Admitting: *Deleted

## 2017-03-10 DIAGNOSIS — E7800 Pure hypercholesterolemia, unspecified: Secondary | ICD-10-CM

## 2017-03-15 MED ORDER — ROSUVASTATIN CALCIUM 20 MG PO TABS: 20.0000 mg | ORAL_TABLET | Freq: Every day | ORAL | 0 refills | Status: DC

## 2017-03-15 MED ORDER — FUROSEMIDE 40 MG PO TABS: 40.0000 mg | ORAL_TABLET | Freq: Every day | ORAL | 0 refills | Status: DC

## 2017-03-16 ENCOUNTER — Ambulatory Visit (INDEPENDENT_AMBULATORY_CARE_PROVIDER_SITE_OTHER): Payer: Medicare HMO | Admitting: Physician Assistant

## 2017-03-16 VITALS — BP 145/78 | HR 52 | Temp 97.6°F | Ht 73.0 in | Wt 314.0 lb

## 2017-03-16 DIAGNOSIS — IMO0001 Reserved for inherently not codable concepts without codable children: Secondary | ICD-10-CM

## 2017-03-16 DIAGNOSIS — Z6841 Body Mass Index (BMI) 40.0 and over, adult: Secondary | ICD-10-CM | POA: Diagnosis not present

## 2017-03-16 DIAGNOSIS — E119 Type 2 diabetes mellitus without complications: Secondary | ICD-10-CM

## 2017-03-16 DIAGNOSIS — E669 Obesity, unspecified: Secondary | ICD-10-CM | POA: Diagnosis not present
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
## 2017-03-30 ENCOUNTER — Ambulatory Visit (INDEPENDENT_AMBULATORY_CARE_PROVIDER_SITE_OTHER): Payer: Medicare HMO | Admitting: Physician Assistant

## 2017-03-30 VITALS — BP 113/68 | HR 52 | Temp 97.7°F | Ht 73.0 in | Wt 309.0 lb

## 2017-03-30 DIAGNOSIS — Z6841 Body Mass Index (BMI) 40.0 and over, adult: Secondary | ICD-10-CM | POA: Diagnosis not present

## 2017-03-30 DIAGNOSIS — G4733 Obstructive sleep apnea (adult) (pediatric): Secondary | ICD-10-CM

## 2017-03-30 DIAGNOSIS — I5189 Other ill-defined heart diseases: Secondary | ICD-10-CM

## 2017-03-30 DIAGNOSIS — E559 Vitamin D deficiency, unspecified: Secondary | ICD-10-CM | POA: Diagnosis not present

## 2017-03-30 DIAGNOSIS — IMO0001 Reserved for inherently not codable concepts without codable children: Secondary | ICD-10-CM

## 2017-03-30 DIAGNOSIS — E119 Type 2 diabetes mellitus without complications: Secondary | ICD-10-CM

## 2017-03-30 DIAGNOSIS — E669 Obesity, unspecified: Secondary | ICD-10-CM

## 2017-03-30 DIAGNOSIS — I519 Heart disease, unspecified: Secondary | ICD-10-CM | POA: Diagnosis not present

## 2017-03-30 MED ORDER — VITAMIN D (ERGOCALCIFEROL) 1.25 MG (50000 UNIT) PO CAPS: 50000.0000 [IU] | ORAL_CAPSULE | ORAL | 0 refills | Status: DC
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
## 2017-04-11 ENCOUNTER — Other Ambulatory Visit: Payer: Self-pay | Admitting: Pharmacy Technician

## 2017-04-11 ENCOUNTER — Ambulatory Visit (INDEPENDENT_AMBULATORY_CARE_PROVIDER_SITE_OTHER): Payer: Self-pay | Admitting: Physician Assistant

## 2017-04-11 ENCOUNTER — Encounter (INDEPENDENT_AMBULATORY_CARE_PROVIDER_SITE_OTHER): Payer: Self-pay

## 2017-04-11 NOTE — Patient Outreach (Signed)
[facility]) Care Management  04/11/2017  NAYEF COLLEGE 1945-12-12 [id number]   Contacted patient in regards to Metformin medication adherence. No answer, No voicemail  Maud Deed. [place], [place] Management [phone]

## 2017-04-11 NOTE — Patient Outreach (Signed)
[facility]) Care Management  04/11/2017  [name] [name] 12/23/1945 [id number]  [place] to verify patient last fill date for Metformin medication adherence. Melissa confirmed it is ready for patient now, for a 30 day supply.  Maud Deed [place], [place] [facility] [phone]

## 2017-04-12 ENCOUNTER — Other Ambulatory Visit: Payer: Self-pay | Admitting: Pharmacy Technician

## 2017-04-17 ENCOUNTER — Ambulatory Visit: Payer: Self-pay

## 2017-04-18 ENCOUNTER — Ambulatory Visit (INDEPENDENT_AMBULATORY_CARE_PROVIDER_SITE_OTHER): Payer: Medicare HMO | Admitting: Physician Assistant

## 2017-04-18 VITALS — BP 131/77 | HR 67 | Temp 97.9°F | Ht 73.0 in | Wt 306.0 lb

## 2017-04-18 DIAGNOSIS — Z6841 Body Mass Index (BMI) 40.0 and over, adult: Secondary | ICD-10-CM

## 2017-04-18 DIAGNOSIS — E559 Vitamin D deficiency, unspecified: Secondary | ICD-10-CM | POA: Diagnosis not present

## 2017-04-18 DIAGNOSIS — E669 Obesity, unspecified: Secondary | ICD-10-CM | POA: Diagnosis not present

## 2017-04-18 DIAGNOSIS — I10 Essential (primary) hypertension: Secondary | ICD-10-CM

## 2017-04-18 DIAGNOSIS — Z9189 Other specified personal risk factors, not elsewhere classified: Secondary | ICD-10-CM | POA: Diagnosis not present

## 2017-04-18 DIAGNOSIS — E119 Type 2 diabetes mellitus without complications: Secondary | ICD-10-CM

## 2017-04-18 DIAGNOSIS — E784 Other hyperlipidemia: Secondary | ICD-10-CM

## 2017-04-18 DIAGNOSIS — IMO0001 Reserved for inherently not codable concepts without codable children: Secondary | ICD-10-CM

## 2017-04-18 DIAGNOSIS — E7849 Other hyperlipidemia: Secondary | ICD-10-CM
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
## 2017-04-19 ENCOUNTER — Encounter (INDEPENDENT_AMBULATORY_CARE_PROVIDER_SITE_OTHER): Payer: Self-pay

## 2017-04-19 LAB — COMPREHENSIVE METABOLIC PANEL
A/G RATIO: 1.9 (ref 1.2–2.2)
ALT: 17 IU/L (ref 0–44)
AST: 13 IU/L (ref 0–40)
Albumin: 4.3 g/dL (ref 3.5–4.8)
Alkaline Phosphatase: 63 IU/L (ref 39–117)
BILIRUBIN TOTAL: 0.5 mg/dL (ref 0.0–1.2)
BUN/Creatinine Ratio: 20 (ref 10–24)
BUN: 26 mg/dL (ref 8–27)
CALCIUM: 9.6 mg/dL (ref 8.6–10.2)
CHLORIDE: 101 mmol/L (ref 96–106)
CO2: 24 mmol/L (ref 20–29)
Creatinine, Ser: 1.27 mg/dL (ref 0.76–1.27)
GFR, EST AFRICAN AMERICAN: 66 mL/min/{1.73_m2} (ref >59)
GFR, EST NON AFRICAN AMERICAN: 57 mL/min/{1.73_m2} — AB (ref >59)
GLUCOSE: 126 mg/dL — AB (ref 65–99)
Globulin, Total: 2.3 g/dL (ref 1.5–4.5)
POTASSIUM: 4.2 mmol/L (ref 3.5–5.2)
Sodium: 141 mmol/L (ref 134–144)
TOTAL PROTEIN: 6.6 g/dL (ref 6.0–8.5)

## 2017-04-19 LAB — HEMOGLOBIN A1C
ESTIMATED AVERAGE GLUCOSE: 137 mg/dL
Hgb A1c MFr Bld: 6.4 % — ABNORMAL HIGH (ref 4.8–5.6)

## 2017-04-19 LAB — LIPID PANEL WITH LDL/HDL RATIO
Cholesterol, Total: 136 mg/dL (ref 100–199)
HDL: 31 mg/dL — AB (ref >39)
LDL CALC: 78 mg/dL (ref 0–99)
LDL/HDL RATIO: 2.5 ratio (ref 0.0–3.6)
TRIGLYCERIDES: 134 mg/dL (ref 0–149)
VLDL Cholesterol Cal: 27 mg/dL (ref 5–40)

## 2017-04-19 LAB — VITAMIN D 25 HYDROXY (VIT D DEFICIENCY, FRACTURES): VIT D 25 HYDROXY: 64.3 ng/mL (ref 30.0–100.0)

## 2017-04-19 LAB — INSULIN, RANDOM: INSULIN: 24.4 u[IU]/mL (ref 2.6–24.9)

## 2017-04-24 ENCOUNTER — Ambulatory Visit: Payer: Self-pay

## 2017-05-08 ENCOUNTER — Ambulatory Visit (INDEPENDENT_AMBULATORY_CARE_PROVIDER_SITE_OTHER): Payer: Medicare HMO | Admitting: Physician Assistant

## 2017-05-08 VITALS — BP 109/69 | HR 67 | Temp 98.2°F | Ht 73.0 in | Wt 303.0 lb

## 2017-05-08 DIAGNOSIS — E119 Type 2 diabetes mellitus without complications: Secondary | ICD-10-CM

## 2017-05-08 DIAGNOSIS — E559 Vitamin D deficiency, unspecified: Secondary | ICD-10-CM | POA: Diagnosis not present

## 2017-05-08 DIAGNOSIS — Z6841 Body Mass Index (BMI) 40.0 and over, adult: Secondary | ICD-10-CM

## 2017-05-08 MED ORDER — VITAMIN D (ERGOCALCIFEROL) 1.25 MG (50000 UNIT) PO CAPS: 50000.0000 [IU] | ORAL_CAPSULE | ORAL | 0 refills | Status: DC
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
## 2017-05-23 ENCOUNTER — Ambulatory Visit (INDEPENDENT_AMBULATORY_CARE_PROVIDER_SITE_OTHER): Payer: Medicare HMO | Admitting: Neurology

## 2017-05-23 ENCOUNTER — Encounter: Payer: Self-pay | Admitting: Neurology

## 2017-05-23 VITALS — BP 128/73 | HR 72 | Ht 73.0 in | Wt 306.0 lb

## 2017-05-23 DIAGNOSIS — I6522 Occlusion and stenosis of left carotid artery: Secondary | ICD-10-CM

## 2017-05-23 DIAGNOSIS — I5030 Unspecified diastolic (congestive) heart failure: Secondary | ICD-10-CM | POA: Diagnosis not present

## 2017-05-23 DIAGNOSIS — G4733 Obstructive sleep apnea (adult) (pediatric): Secondary | ICD-10-CM

## 2017-05-23 DIAGNOSIS — G453 Amaurosis fugax: Secondary | ICD-10-CM | POA: Diagnosis not present
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
## 2017-05-29 ENCOUNTER — Ambulatory Visit (INDEPENDENT_AMBULATORY_CARE_PROVIDER_SITE_OTHER): Payer: Medicare HMO | Admitting: *Deleted

## 2017-05-29 ENCOUNTER — Ambulatory Visit (INDEPENDENT_AMBULATORY_CARE_PROVIDER_SITE_OTHER): Payer: Medicare HMO | Admitting: Physician Assistant

## 2017-05-29 VITALS — BP 128/74 | HR 64 | Temp 98.3°F | Ht 73.0 in | Wt 299.0 lb

## 2017-05-29 DIAGNOSIS — Z23 Encounter for immunization: Secondary | ICD-10-CM

## 2017-05-29 DIAGNOSIS — E119 Type 2 diabetes mellitus without complications: Secondary | ICD-10-CM | POA: Diagnosis not present

## 2017-05-29 DIAGNOSIS — Z6839 Body mass index (BMI) 39.0-39.9, adult: Secondary | ICD-10-CM | POA: Diagnosis not present
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
## 2017-06-05 ENCOUNTER — Ambulatory Visit (INDEPENDENT_AMBULATORY_CARE_PROVIDER_SITE_OTHER): Payer: Medicare HMO | Admitting: Dietician

## 2017-06-05 VITALS — Ht 73.0 in | Wt 298.0 lb

## 2017-06-05 DIAGNOSIS — E119 Type 2 diabetes mellitus without complications: Secondary | ICD-10-CM

## 2017-06-05 DIAGNOSIS — Z6839 Body mass index (BMI) 39.0-39.9, adult: Secondary | ICD-10-CM | POA: Diagnosis not present

## 2017-06-05 NOTE — Progress Notes (Signed)
  Office: [phone]  /  Fax: [phone]  OBESITY BEHAVIORAL INTERVENTION VISIT  Today's visit was # 11 out of 22.  Starting weight: 340 lbs Starting date: 12/28/16 Today's weight : Weight: 298 lb (135.2 kg)  Today's date: 06/05/2017 Total lbs lost to date: 42 lbs (Patients must lose 7 lbs in the first 6 months to continue with counseling)   ASK: We discussed the diagnosis of obesity with [name] [name] today and [name] [name] agreed to give [name] permission to discuss obesity behavioral modification therapy today. [name] is here today to discuss his progress with his obesity treatment plan. He is working on intensive lifestyle modifications including diet and exercise for weight loss.   ASSESS: [name] has the diagnosis of obesity and his BMI today is  39.32 [name] is in the action stage of change   ADVISE: [name] was educated on the multiple health risks of obesity as well as the benefit of weight loss to improve his health. He was advised of the need for long term treatment and the importance of lifestyle modifications.  AGREE: Multiple dietary modification options and treatment options were discussed and  [name] agreed to follow the Category 3 plan We discussed the following Behavioral Modification Stratagies today: work on meal planning and easy cooking plans, and recipe modification

## 2017-06-08 ENCOUNTER — Ambulatory Visit (INDEPENDENT_AMBULATORY_CARE_PROVIDER_SITE_OTHER): Payer: Medicare HMO | Admitting: Dietician

## 2017-06-13 ENCOUNTER — Other Ambulatory Visit: Payer: Self-pay | Admitting: Student in an Organized Health Care Education/Training Program

## 2017-06-13 DIAGNOSIS — E119 Type 2 diabetes mellitus without complications: Secondary | ICD-10-CM

## 2017-06-13 MED ORDER — METFORMIN HCL 1000 MG PO TABS: ORAL_TABLET | ORAL | 3 refills | Status: DC

## 2017-06-19 ENCOUNTER — Ambulatory Visit (INDEPENDENT_AMBULATORY_CARE_PROVIDER_SITE_OTHER): Payer: Medicare HMO | Admitting: Physician Assistant

## 2017-06-19 VITALS — BP 126/77 | HR 63 | Temp 98.0°F | Ht 73.0 in | Wt 300.0 lb

## 2017-06-19 DIAGNOSIS — E119 Type 2 diabetes mellitus without complications: Secondary | ICD-10-CM | POA: Diagnosis not present

## 2017-06-19 DIAGNOSIS — E66812 Obesity, class 2: Secondary | ICD-10-CM

## 2017-06-19 DIAGNOSIS — E559 Vitamin D deficiency, unspecified: Secondary | ICD-10-CM

## 2017-06-19 DIAGNOSIS — Z6839 Body mass index (BMI) 39.0-39.9, adult: Secondary | ICD-10-CM | POA: Diagnosis not present

## 2017-06-19 DIAGNOSIS — Z0189 Encounter for other specified special examinations: Secondary | ICD-10-CM | POA: Diagnosis not present

## 2017-06-19 MED ORDER — VITAMIN D (ERGOCALCIFEROL) 1.25 MG (50000 UNIT) PO CAPS: 50000.0000 [IU] | ORAL_CAPSULE | ORAL | 0 refills | Status: DC
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
## 2017-07-03 ENCOUNTER — Ambulatory Visit (INDEPENDENT_AMBULATORY_CARE_PROVIDER_SITE_OTHER): Payer: Medicare HMO | Admitting: Physician Assistant

## 2017-07-03 VITALS — BP 108/65 | HR 77 | Temp 98.3°F | Ht 73.0 in | Wt 294.0 lb

## 2017-07-03 DIAGNOSIS — Z6838 Body mass index (BMI) 38.0-38.9, adult: Secondary | ICD-10-CM | POA: Diagnosis not present

## 2017-07-03 DIAGNOSIS — E119 Type 2 diabetes mellitus without complications: Secondary | ICD-10-CM
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
## 2017-07-12 ENCOUNTER — Other Ambulatory Visit: Payer: Self-pay | Admitting: Student in an Organized Health Care Education/Training Program

## 2017-07-12 MED ORDER — FUROSEMIDE 40 MG PO TABS: 40.0000 mg | ORAL_TABLET | Freq: Every day | ORAL | 3 refills | Status: DC

## 2017-07-12 NOTE — Progress Notes (Signed)
Received fax refill request for furosemide. Sent electronically.

## 2017-07-19 ENCOUNTER — Ambulatory Visit (INDEPENDENT_AMBULATORY_CARE_PROVIDER_SITE_OTHER): Payer: Medicare HMO | Admitting: Family Medicine

## 2017-07-19 VITALS — BP 114/72 | HR 64 | Temp 98.1°F | Ht 73.0 in | Wt 294.0 lb

## 2017-07-19 DIAGNOSIS — E119 Type 2 diabetes mellitus without complications: Secondary | ICD-10-CM | POA: Diagnosis not present

## 2017-07-19 DIAGNOSIS — Z6838 Body mass index (BMI) 38.0-38.9, adult: Secondary | ICD-10-CM

## 2017-07-19 DIAGNOSIS — E559 Vitamin D deficiency, unspecified: Secondary | ICD-10-CM
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
## 2017-07-20 LAB — COMPREHENSIVE METABOLIC PANEL
ALK PHOS: 62 IU/L (ref 39–117)
ALT: 12 IU/L (ref 0–44)
AST: 14 IU/L (ref 0–40)
Albumin/Globulin Ratio: 2 (ref 1.2–2.2)
Albumin: 4.5 g/dL (ref 3.5–4.8)
BILIRUBIN TOTAL: 0.5 mg/dL (ref 0.0–1.2)
BUN/Creatinine Ratio: 20 (ref 10–24)
BUN: 24 mg/dL (ref 8–27)
CHLORIDE: 100 mmol/L (ref 96–106)
CO2: 22 mmol/L (ref 20–29)
Calcium: 9.7 mg/dL (ref 8.6–10.2)
Creatinine, Ser: 1.19 mg/dL (ref 0.76–1.27)
GFR calc Af Amer: 71 mL/min/{1.73_m2} (ref >59)
GFR calc non Af Amer: 62 mL/min/{1.73_m2} (ref >59)
Globulin, Total: 2.3 g/dL (ref 1.5–4.5)
Glucose: 124 mg/dL — ABNORMAL HIGH (ref 65–99)
POTASSIUM: 4.4 mmol/L (ref 3.5–5.2)
SODIUM: 141 mmol/L (ref 134–144)
Total Protein: 6.8 g/dL (ref 6.0–8.5)

## 2017-07-20 LAB — INSULIN, RANDOM: INSULIN: 15.7 u[IU]/mL (ref 2.6–24.9)

## 2017-07-20 LAB — HEMOGLOBIN A1C
ESTIMATED AVERAGE GLUCOSE: 134 mg/dL
Hgb A1c MFr Bld: 6.3 % — ABNORMAL HIGH (ref 4.8–5.6)

## 2017-07-20 LAB — VITAMIN D 25 HYDROXY (VIT D DEFICIENCY, FRACTURES): Vit D, 25-Hydroxy: 49.7 ng/mL (ref 30.0–100.0)

## 2017-08-09 ENCOUNTER — Ambulatory Visit (INDEPENDENT_AMBULATORY_CARE_PROVIDER_SITE_OTHER): Payer: Medicare HMO | Admitting: Family Medicine

## 2017-08-09 VITALS — BP 126/74 | HR 77 | Temp 97.9°F | Ht 73.0 in | Wt 294.0 lb

## 2017-08-09 DIAGNOSIS — E559 Vitamin D deficiency, unspecified: Secondary | ICD-10-CM | POA: Diagnosis not present

## 2017-08-09 DIAGNOSIS — Z9189 Other specified personal risk factors, not elsewhere classified: Secondary | ICD-10-CM

## 2017-08-09 DIAGNOSIS — Z6838 Body mass index (BMI) 38.0-38.9, adult: Secondary | ICD-10-CM

## 2017-08-09 MED ORDER — VITAMIN D (ERGOCALCIFEROL) 1.25 MG (50000 UNIT) PO CAPS: 50000.0000 [IU] | ORAL_CAPSULE | ORAL | 0 refills | Status: DC
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
## 2017-08-15 ENCOUNTER — Ambulatory Visit (HOSPITAL_COMMUNITY)
Admission: RE | Admit: 2017-08-15 | Discharge: 2017-08-15 | Disposition: A | Discharge status: Home / Self Care | Payer: Medicare HMO | Source: Ambulatory Visit | Attending: Family Medicine | Admitting: Family Medicine

## 2017-08-15 ENCOUNTER — Encounter: Payer: Self-pay | Admitting: Student in an Organized Health Care Education/Training Program

## 2017-08-15 ENCOUNTER — Ambulatory Visit (INDEPENDENT_AMBULATORY_CARE_PROVIDER_SITE_OTHER): Payer: Medicare HMO | Admitting: Student in an Organized Health Care Education/Training Program

## 2017-08-15 ENCOUNTER — Other Ambulatory Visit: Payer: Self-pay

## 2017-08-15 ENCOUNTER — Telehealth: Payer: Self-pay | Admitting: Student in an Organized Health Care Education/Training Program

## 2017-08-15 VITALS — BP 130/84 | HR 60 | Temp 97.7°F | Ht 73.0 in | Wt 301.8 lb

## 2017-08-15 DIAGNOSIS — R0781 Pleurodynia: Secondary | ICD-10-CM | POA: Insufficient documentation

## 2017-08-15 DIAGNOSIS — M25522 Pain in left elbow: Secondary | ICD-10-CM | POA: Diagnosis not present

## 2017-08-15 DIAGNOSIS — R0789 Other chest pain: Secondary | ICD-10-CM

## 2017-08-15 DIAGNOSIS — M25552 Pain in left hip: Secondary | ICD-10-CM | POA: Diagnosis not present

## 2017-08-15 DIAGNOSIS — S299XXA Unspecified injury of thorax, initial encounter: Secondary | ICD-10-CM | POA: Diagnosis not present

## 2017-08-15 DIAGNOSIS — M19022 Primary osteoarthritis, left elbow: Secondary | ICD-10-CM | POA: Diagnosis not present

## 2017-08-15 MED ORDER — BACLOFEN 5 MG PO TABS: 5.0000 mg | ORAL_TABLET | Freq: Two times a day (BID) | ORAL | 0 refills | Status: DC | PRN

## 2017-08-15 MED ORDER — BACLOFEN 5 MG PO TABS: 5.0000 mg | ORAL_TABLET | Freq: Every evening | ORAL | 0 refills | Status: DC | PRN

## 2017-08-15 MED ORDER — ACETAMINOPHEN 500 MG PO TABS: 1000.0000 mg | ORAL_TABLET | Freq: Three times a day (TID) | ORAL | 0 refills | Status: DC

## 2017-08-15 NOTE — Progress Notes (Signed)
CC: Rib and elbow pain  HPI: [name] [name] is a 72 y.o. male with PMH significant for type 2 diabetes, hypertension, HLD, and obesity who presents to [facility] today with rib pain and elbow pain of 2 months duration.   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   Rib pain Pain is worst along his right axillary line. Does not radiate. No flank pain. No urinary symptoms. Does feel he takes more shallow breaths sometimes with the pain. No pattern to the pain, it is constant. It is limiting his ability to function. 2 months duration.  Left Elbow pain 2 months duration. Left elbow pain in the joint. He has not noticed any redness, swelling, fevers. He continues to have full range of motion, however notes that he now has pain with picking up a gallon of milk. Feels the pain does not have a pattern. Pain does not radiate. Does not endorse any weakness or loss of sensation.  Review of Symptoms:  See HPI for ROS.   CC, SH/smoking status, and VS noted.  Objective: BP 130/84   Pulse 60   Temp 97.7 F (36.5 C) (Oral)   Ht 6\' 1"  (1.854 m)   Wt (!) 301 lb 12.8 oz (136.9 kg)   SpO2 99%   BMI 39.82 kg/m  GEN: NAD, alert, cooperative, and pleasant. MSK:  Rib: TTP worst in the right side near the axillary line.  No redness or swelling. No spinal point tenderness. No paraspinal tenderness. Able to twist at the hip and bend. No decreased range of motion. Left elbow: No redness, warmth, or effusion. Pain is within the joint. Full range of motion. Normal strength. Pain with flexion at the elbow. Normal sensation and palpable pulses distal to the elbow. NEURO: II-XII grossly intact, normal gait, peripheral sensation intact PSYCH: AAOx3, appropriate affect  Assessment and plan:  Elbow pain,  left No effusion or signs of infection on exam. No concern for septic joint at this time. Because symptoms persisted for 2 months, I think it is reasonable to check an x-ray of the elbow. If there are no bony abnormalities and symptoms do not improve with the current pain control plan, will consider sending to sports medicine for further evaluation.  Rib pain on right side Pain seems to be musculoskeletal, however it is over the ribs and he does note pain with deep inspiration on that side. No CVA tenderness or concerning symptoms for kidney involvement. No fevers or cough to think of lung involvement. 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 - X-ray ribs - Tylenol 1000 mg 3 times a day standing, baclofen 5 mg daily at bedtime. - Consider sending to sports medicine if x-rays are normal and symptoms persist despite pain management - Discussed not driving with the baclofen, start by just taking it one time at night as needed for pain    Orders Placed This Encounter  Procedures  . DG Ribs Unilateral Right    Standing Status:   Future    Number of Occurrences:   1    Standing Expiration Date:   10/14/2018    Order Specific Question:   Reason for Exam (SYMPTOM  OR DIAGNOSIS REQUIRED)    Answer:   right rib  pain    Order Specific Question:   Preferred imaging location?    Answer:   [facility]    Order Specific Question:   Radiology Contrast Protocol - do NOT remove file path    Answer:   \\[email or web address]\DXFluoroContrastProtocols.[email or web address]  . DG Elbow Complete Left    Standing Status:   Future    Number of Occurrences:   1    Standing Expiration Date:   10/14/2018    Order Specific Question:   Reason for Exam (SYMPTOM  OR DIAGNOSIS REQUIRED)    Answer:   elbow pain    Order Specific Question:   Preferred imaging location?    Answer:   [facility]    Order Specific Question:   Radiology Contrast Protocol - do NOT remove file path    Answer:    \\[email or web address]\[email or web address]s.[email or web address]    Meds ordered this encounter  Medications  . acetaminophen (TYLENOL) 500 MG tablet    Sig: Take 2 tablets (1,000 mg total) by mouth 3 (three) times daily.    Dispense:  90 tablet    Refill:  0  . Baclofen 5 MG TABS    Sig: Take 5 mg by mouth at bedtime as needed.    Dispense:  20 tablet    Refill:  0    Everrett Coombe, MD,MS,  PGY2 08/15/2017 4:21 PM

## 2017-08-15 NOTE — Assessment & Plan Note (Addendum)
Pain seems to be musculoskeletal, however it is over the ribs and he does note pain with deep inspiration on that side. No CVA tenderness or concerning symptoms for kidney involvement. No fevers or cough to think of lung involvement. 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 - X-ray ribs - Tylenol 1000 mg 3 times a day standing, baclofen 5 mg daily at bedtime. - Consider sending to sports medicine if x-rays are normal and symptoms persist despite pain management - Discussed not driving with the baclofen, start by just taking it one time at night as needed for pain

## 2017-08-15 NOTE — Telephone Encounter (Signed)
Called and left voice mail with x-ray results. No acute abnormalities seen on rib or elbow x-rays. Continue pain management plan as discussed earlier today at visit.

## 2017-08-15 NOTE — Patient Instructions (Signed)
It was a pleasure seeing you today in our clinic.  Congratulations on your hard work with lifestyle modifications, it is such a pleasure to see the results of all of your efforts!   Here is the treatment plan we have discussed and agreed upon together: - We will get X-rays of your ribs and elbow - I will call you with these results. If you do not hear from me within the next week, please give our office a call.  - Take tylenol three times daily to get ahead of your pain - I sent a muscle relaxer that I would start by taking at bedtime because it can cause drowsiness  Our clinic's number is [phone]. Please call with questions or concerns about what we discussed today.  Be well, Dr. Burr Medico

## 2017-08-15 NOTE — Assessment & Plan Note (Signed)
No effusion or signs of infection on exam. No concern for septic joint at this time. Because symptoms persisted for 2 months, I think it is reasonable to check an x-ray of the elbow. If there are no bony abnormalities and symptoms do not improve with the current pain control plan, will consider sending to sports medicine for further evaluation.

## 2017-08-24 ENCOUNTER — Encounter (INDEPENDENT_AMBULATORY_CARE_PROVIDER_SITE_OTHER): Payer: Self-pay | Admitting: Physician Assistant

## 2017-08-24 ENCOUNTER — Ambulatory Visit (INDEPENDENT_AMBULATORY_CARE_PROVIDER_SITE_OTHER): Payer: Medicare HMO | Admitting: Physician Assistant

## 2017-08-24 VITALS — BP 129/83 | HR 62 | Temp 97.6°F | Ht 73.0 in | Wt 295.0 lb

## 2017-08-24 DIAGNOSIS — Z6839 Body mass index (BMI) 39.0-39.9, adult: Secondary | ICD-10-CM | POA: Diagnosis not present

## 2017-08-24 DIAGNOSIS — E119 Type 2 diabetes mellitus without complications: Secondary | ICD-10-CM

## 2017-08-24 DIAGNOSIS — E559 Vitamin D deficiency, unspecified: Secondary | ICD-10-CM | POA: Diagnosis not present

## 2017-08-24 MED ORDER — VITAMIN D (ERGOCALCIFEROL) 1.25 MG (50000 UNIT) PO CAPS: 50000.0000 [IU] | ORAL_CAPSULE | ORAL | 0 refills | Status: DC
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
## 2017-09-05 ENCOUNTER — Other Ambulatory Visit: Payer: Self-pay | Admitting: *Deleted

## 2017-09-05 DIAGNOSIS — I10 Essential (primary) hypertension: Secondary | ICD-10-CM

## 2017-09-05 DIAGNOSIS — E119 Type 2 diabetes mellitus without complications: Secondary | ICD-10-CM

## 2017-09-05 DIAGNOSIS — E7800 Pure hypercholesterolemia, unspecified: Secondary | ICD-10-CM

## 2017-09-06 MED ORDER — METOPROLOL SUCCINATE ER 25 MG PO TB24: 12.5000 mg | ORAL_TABLET | Freq: Every day | ORAL | 3 refills | Status: DC

## 2017-09-06 MED ORDER — LOSARTAN POTASSIUM-HCTZ 50-12.5 MG PO TABS: 1.0000 | ORAL_TABLET | Freq: Every day | ORAL | 0 refills | Status: DC

## 2017-09-06 MED ORDER — ROSUVASTATIN CALCIUM 20 MG PO TABS: 20.0000 mg | ORAL_TABLET | Freq: Every day | ORAL | 0 refills | Status: DC

## 2017-09-12 ENCOUNTER — Ambulatory Visit (INDEPENDENT_AMBULATORY_CARE_PROVIDER_SITE_OTHER): Payer: Medicare HMO | Admitting: Physician Assistant

## 2017-09-12 VITALS — BP 120/77 | HR 71 | Temp 98.0°F | Ht 73.0 in | Wt 295.0 lb

## 2017-09-12 DIAGNOSIS — E119 Type 2 diabetes mellitus without complications: Secondary | ICD-10-CM | POA: Diagnosis not present

## 2017-09-12 DIAGNOSIS — Z6839 Body mass index (BMI) 39.0-39.9, adult: Secondary | ICD-10-CM | POA: Diagnosis not present
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
## 2017-09-27 ENCOUNTER — Ambulatory Visit: Payer: Self-pay | Admitting: Family

## 2017-09-27 ENCOUNTER — Encounter (HOSPITAL_COMMUNITY): Payer: Self-pay

## 2017-09-28 ENCOUNTER — Ambulatory Visit (HOSPITAL_COMMUNITY)
Admission: RE | Admit: 2017-09-28 | Discharge: 2017-09-28 | Disposition: A | Discharge status: Home / Self Care | Payer: Medicare HMO | Source: Ambulatory Visit | Attending: Family | Admitting: Family

## 2017-09-28 ENCOUNTER — Ambulatory Visit (INDEPENDENT_AMBULATORY_CARE_PROVIDER_SITE_OTHER): Payer: Medicare HMO | Admitting: Family

## 2017-09-28 ENCOUNTER — Other Ambulatory Visit: Payer: Self-pay

## 2017-09-28 ENCOUNTER — Encounter: Payer: Self-pay | Admitting: Family

## 2017-09-28 VITALS — BP 155/83 | HR 65 | Temp 97.0°F | Resp 20 | Ht 73.0 in | Wt 303.0 lb

## 2017-09-28 DIAGNOSIS — I6522 Occlusion and stenosis of left carotid artery: Secondary | ICD-10-CM | POA: Diagnosis not present

## 2017-09-28 DIAGNOSIS — Z9889 Other specified postprocedural states: Secondary | ICD-10-CM | POA: Diagnosis not present

## 2017-09-28 LAB — VAS US CAROTID
LCCADDIAS: -31 cm/s
LCCADSYS: -100 cm/s
LCCAPSYS: 100 cm/s
LEFT ECA DIAS: -24 cm/s
LICADSYS: -90 cm/s
Left CCA prox dias: 25 cm/s
Left ICA dist dias: -38 cm/s
Left ICA prox dias: -31 cm/s
Left ICA prox sys: -110 cm/s
RCCAPSYS: 99 cm/s
RIGHT CCA MID DIAS: 26 cm/s
RIGHT ECA DIAS: -24 cm/s
Right CCA prox dias: 22 cm/s
Right cca dist sys: -80 cm/s

## 2017-09-28 NOTE — Patient Instructions (Signed)
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
## 2017-10-02 ENCOUNTER — Ambulatory Visit (INDEPENDENT_AMBULATORY_CARE_PROVIDER_SITE_OTHER): Payer: Medicare HMO | Admitting: Neurology

## 2017-10-02 DIAGNOSIS — I5030 Unspecified diastolic (congestive) heart failure: Secondary | ICD-10-CM

## 2017-10-02 DIAGNOSIS — I6522 Occlusion and stenosis of left carotid artery: Secondary | ICD-10-CM

## 2017-10-02 DIAGNOSIS — G453 Amaurosis fugax: Secondary | ICD-10-CM

## 2017-10-02 DIAGNOSIS — G4733 Obstructive sleep apnea (adult) (pediatric): Secondary | ICD-10-CM | POA: Diagnosis not present

## 2017-10-03 ENCOUNTER — Ambulatory Visit (INDEPENDENT_AMBULATORY_CARE_PROVIDER_SITE_OTHER): Payer: Medicare HMO | Admitting: Physician Assistant

## 2017-10-03 ENCOUNTER — Other Ambulatory Visit: Payer: Self-pay | Admitting: Neurology

## 2017-10-03 VITALS — BP 134/80 | HR 64 | Temp 97.6°F | Ht 73.0 in | Wt 296.0 lb

## 2017-10-03 DIAGNOSIS — I5030 Unspecified diastolic (congestive) heart failure: Secondary | ICD-10-CM | POA: Insufficient documentation

## 2017-10-03 DIAGNOSIS — E662 Morbid (severe) obesity with alveolar hypoventilation: Secondary | ICD-10-CM

## 2017-10-03 DIAGNOSIS — I5022 Chronic systolic (congestive) heart failure: Secondary | ICD-10-CM | POA: Insufficient documentation

## 2017-10-03 DIAGNOSIS — G4733 Obstructive sleep apnea (adult) (pediatric): Secondary | ICD-10-CM | POA: Insufficient documentation

## 2017-10-03 DIAGNOSIS — I63239 Cerebral infarction due to unspecified occlusion or stenosis of unspecified carotid arteries: Secondary | ICD-10-CM

## 2017-10-03 DIAGNOSIS — Z9189 Other specified personal risk factors, not elsewhere classified: Secondary | ICD-10-CM

## 2017-10-03 DIAGNOSIS — G453 Amaurosis fugax: Secondary | ICD-10-CM

## 2017-10-03 DIAGNOSIS — Z6839 Body mass index (BMI) 39.0-39.9, adult: Secondary | ICD-10-CM

## 2017-10-03 DIAGNOSIS — E559 Vitamin D deficiency, unspecified: Secondary | ICD-10-CM

## 2017-10-03 DIAGNOSIS — I6522 Occlusion and stenosis of left carotid artery: Secondary | ICD-10-CM | POA: Insufficient documentation

## 2017-10-03 DIAGNOSIS — I5043 Acute on chronic combined systolic (congestive) and diastolic (congestive) heart failure: Secondary | ICD-10-CM

## 2017-10-03 HISTORY — DX: Chronic systolic (congestive) heart failure: I50.22

## 2017-10-03 MED ORDER — VITAMIN D (ERGOCALCIFEROL) 1.25 MG (50000 UNIT) PO CAPS: 50000.0000 [IU] | ORAL_CAPSULE | ORAL | 0 refills | Status: DC
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
## 2017-10-04 ENCOUNTER — Telehealth: Payer: Self-pay | Admitting: Neurology

## 2017-10-04 ENCOUNTER — Other Ambulatory Visit: Payer: Self-pay | Admitting: Neurology

## 2017-10-04 ENCOUNTER — Encounter: Payer: Self-pay | Admitting: Neurology

## 2017-10-04 ENCOUNTER — Ambulatory Visit: Payer: Medicare HMO | Admitting: Neurology

## 2017-10-04 VITALS — BP 130/74 | HR 69 | Ht 73.0 in | Wt 309.0 lb

## 2017-10-04 DIAGNOSIS — Z6839 Body mass index (BMI) 39.0-39.9, adult: Secondary | ICD-10-CM | POA: Diagnosis not present

## 2017-10-04 DIAGNOSIS — J45909 Unspecified asthma, uncomplicated: Secondary | ICD-10-CM | POA: Diagnosis not present

## 2017-10-04 DIAGNOSIS — G4733 Obstructive sleep apnea (adult) (pediatric): Secondary | ICD-10-CM | POA: Diagnosis not present

## 2017-10-04 DIAGNOSIS — I5030 Unspecified diastolic (congestive) heart failure: Secondary | ICD-10-CM | POA: Diagnosis not present

## 2017-10-04 DIAGNOSIS — E662 Morbid (severe) obesity with alveolar hypoventilation: Secondary | ICD-10-CM
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
## 2017-10-04 NOTE — Telephone Encounter (Signed)
Called the pt to make him aware of the sleep study results. I informed him of the findings and the patient states that he normally doesn't have issues with restlessness. The patient states that he was extremely uncomfortable in the bed and with the pillow and everything attached. When I mentioned that Dr Brett Fairy is wanting him to come in for a CPAP titration test the patient is very reluctant to follow through with this based off of his night in the lab. He would like to discuss this further before proceeding . We had a cancellation at 2:30 today which I was able to offer the pt. Pt agreed to that apt with arrival by 2:15. Pt verbalized understanding.

## 2017-10-04 NOTE — Telephone Encounter (Signed)
-----   Message from [name], MD sent at 10/03/2017  5:51 PM EST ----- There are 3 sleep disorders present, 1)severe PLMs ( periodic limb movements of sleep) leading to arousals.  2) mild OSA but associated with a gas exchange problem  3) hypoxemia and hypercapnia. I believe this reflects obesity hypoventilation.    To address all problems CPAP is needed- it can correct apnea, may correct hypoxemia -and an attended study will allow for oxygen titration if not.  PLMs get often better when hypoxia is resolved, too.

## 2017-10-10 ENCOUNTER — Telehealth: Payer: Self-pay | Admitting: Neurology

## 2017-10-10 NOTE — Telephone Encounter (Signed)
Spoke with pt to schedule him for his CPAP and he stated he did not want to go through with the study.

## 2017-10-16 DIAGNOSIS — D2262 Melanocytic nevi of left upper limb, including shoulder: Secondary | ICD-10-CM | POA: Diagnosis not present

## 2017-10-16 DIAGNOSIS — L821 Other seborrheic keratosis: Secondary | ICD-10-CM | POA: Diagnosis not present

## 2017-10-16 DIAGNOSIS — D225 Melanocytic nevi of trunk: Secondary | ICD-10-CM | POA: Diagnosis not present

## 2017-10-16 DIAGNOSIS — L570 Actinic keratosis: Secondary | ICD-10-CM | POA: Diagnosis not present

## 2017-10-16 DIAGNOSIS — L814 Other melanin hyperpigmentation: Secondary | ICD-10-CM | POA: Diagnosis not present

## 2017-10-16 DIAGNOSIS — D485 Neoplasm of uncertain behavior of skin: Secondary | ICD-10-CM | POA: Diagnosis not present

## 2017-10-16 DIAGNOSIS — L739 Follicular disorder, unspecified: Secondary | ICD-10-CM | POA: Diagnosis not present

## 2017-10-16 DIAGNOSIS — D1801 Hemangioma of skin and subcutaneous tissue: Secondary | ICD-10-CM | POA: Diagnosis not present

## 2017-10-16 DIAGNOSIS — D2362 Other benign neoplasm of skin of left upper limb, including shoulder: Secondary | ICD-10-CM | POA: Diagnosis not present

## 2017-10-17 ENCOUNTER — Ambulatory Visit (INDEPENDENT_AMBULATORY_CARE_PROVIDER_SITE_OTHER): Payer: Medicare HMO | Admitting: Physician Assistant

## 2017-10-17 VITALS — BP 130/76 | HR 55 | Temp 97.8°F | Ht 73.0 in | Wt 295.0 lb

## 2017-10-17 DIAGNOSIS — Z6838 Body mass index (BMI) 38.0-38.9, adult: Secondary | ICD-10-CM | POA: Diagnosis not present

## 2017-10-17 DIAGNOSIS — E119 Type 2 diabetes mellitus without complications: Secondary | ICD-10-CM | POA: Diagnosis not present
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
## 2017-10-24 DIAGNOSIS — G4733 Obstructive sleep apnea (adult) (pediatric): Secondary | ICD-10-CM | POA: Diagnosis not present

## 2017-10-30 ENCOUNTER — Other Ambulatory Visit: Payer: Self-pay

## 2017-10-30 ENCOUNTER — Ambulatory Visit (INDEPENDENT_AMBULATORY_CARE_PROVIDER_SITE_OTHER): Payer: Medicare HMO | Admitting: Internal Medicine

## 2017-10-30 ENCOUNTER — Encounter: Payer: Self-pay | Admitting: Internal Medicine

## 2017-10-30 VITALS — BP 128/68 | HR 67 | Temp 98.0°F | Ht 73.0 in | Wt 307.4 lb

## 2017-10-30 DIAGNOSIS — S8992XA Unspecified injury of left lower leg, initial encounter: Secondary | ICD-10-CM

## 2017-10-30 NOTE — Patient Instructions (Signed)
I am sorry about your fall and resulting leg injury. The bruising will likely take several weeks to heal. I would recommend placing ice on the area and taking Ibuprofen to decrease inflammation. If you have significant increase in pain, leg swells more, leg gets warm and red in the calf you need to go to the ER. You should also be seen if you have chest pain, shortness of breath, or new cough.

## 2017-10-30 NOTE — Progress Notes (Signed)
Subjective:    [name] [name] - 72 y.o. male MRN [id number]  Date of birth: [birth year]  HPI  [name] [name] is here for leg bruising. Reports that he fell about 6 days ago at home. Stumbled over some furniture and landed on that leg. Says he had pain but has been able to tolerate ambulating, weight bearing, daily activities without any significant hinderance. He noticed a lot of bruising a day or two later as well as swelling of the leg. Sister was concerned that he may have a blood clot and encouraged him to be evaluated. He denies pain in the calf. Denies chest pain, SOB, and cough. No personal history of VTE. Reports he has not been immobile after injury.     -  reports that he has never smoked. He has never used smokeless tobacco. - Review of Systems: Per HPI. - Past Medical History: Patient Active Problem List   Diagnosis Date Noted  . CHF (congestive heart failure), NYHA class II, unspecified failure chronicity, diastolic ([place]) 03/50/0938  . Amaurosis fugax of left eye 10/03/2017  . Carotid artery, internal, occlusion, left 10/03/2017  . OSA (obstructive sleep apnea) 10/03/2017  . Rib pain on right side 08/15/2017  . Elbow pain, left 08/15/2017  . Vitamin D deficiency 03/02/2017  . Ureterolithiasis 12/24/2015  . Type 2 diabetes mellitus without complication, without long-term current use of insulin ([place]) 11/23/2015  . Cervical neck pain with evidence of disc disease   . Acute diastolic heart failure ([place])   . Essential hypertension   . Cerebrovascular accident (CVA) ([place]) 05/18/2015  . Dysphagia, pharyngeal 03/12/2015  . Symptomatic stenosis of left carotid artery 12/30/2014  . Cervical spondylosis with radiculopathy 07/15/2014  . OSA on CPAP 11/25/2013  . Prostate cancer ([place]) 07/09/2013  . Squamous cell carcinoma in situ of skin 04/02/2013  . 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 HERNIA, UMBILICAL 18/29/9371  . COLON POLYP 09/28/2006  . HYPERCHOLESTEROLEMIA 09/28/2006    . Obesity 09/28/2006  . ERECTILE DYSFUNCTION 09/28/2006  . Depression 09/28/2006   - Medications: reviewed and updated   Objective:   Physical Exam BP 128/68   Pulse 67   Temp 98 F (36.7 C) (Oral)   Ht 6\' 1"  (1.854 m)   Wt (!) 307 lb 6.4 oz (139.4 kg)   SpO2 97%   BMI 40.56 kg/m  Gen: NAD, alert, cooperative with exam, well-appearing CV: RRR, good S1/S2, no murmur, no edema, capillary refill brisk, 2+ pedal pulses  Resp: CTABL, no wheezes, non-labored Ext: Left LE with significant area of ecchymosis extending from the anterior-medial thigh to the medial aspect of the shin with discomfort to firm palpation. No increased warmth or spreading erythema. Left calf measures 2-3 cm less than right calf 10 cm below tibial tuberosity. Negative Homan's sign on left. No TTP of the left calf. Strength of left LE is preserved. Patient able to bear weight well.     Assessment & Plan:   1. Injury of left lower extremity, initial encounter Appears to merely have soft tissue injury with severe ecchymosis from fall. Doubt significant bony injury warranting imaging as patient has been able to weight bear and do daily tasks of living without impairment for several days after sustaining injury. Doubt DVT as Well's Score is 0 with no clinical exam findings that are concerning for blood clot. Doubt compartment syndrome as patient does not have pain out of proportion to exam, good distal pulses, no paraesthesias. No signs of secondary bacterial infection  present on exam. Recommended icing and NSAIDs to decrease inflammation. Strict return precautions including severe pain in the extremity, worsening of edema, spreading erythema, pain in the calf discussed.   Phill Myron, D.O. 10/30/2017, 1:31 PM PGY-3, [place]

## 2017-11-03 DIAGNOSIS — Z961 Presence of intraocular lens: Secondary | ICD-10-CM | POA: Diagnosis not present

## 2017-11-03 DIAGNOSIS — E119 Type 2 diabetes mellitus without complications: Secondary | ICD-10-CM | POA: Diagnosis not present

## 2017-11-03 DIAGNOSIS — H26491 Other secondary cataract, right eye: Secondary | ICD-10-CM | POA: Diagnosis not present

## 2017-11-03 LAB — HM DIABETES EYE EXAM

## 2017-11-06 ENCOUNTER — Ambulatory Visit (INDEPENDENT_AMBULATORY_CARE_PROVIDER_SITE_OTHER): Payer: Medicare HMO | Admitting: Physician Assistant

## 2017-11-06 VITALS — BP 116/72 | HR 61 | Temp 97.9°F | Ht 73.0 in | Wt 298.0 lb

## 2017-11-06 DIAGNOSIS — G4733 Obstructive sleep apnea (adult) (pediatric): Secondary | ICD-10-CM

## 2017-11-06 DIAGNOSIS — Z9189 Other specified personal risk factors, not elsewhere classified: Secondary | ICD-10-CM

## 2017-11-06 DIAGNOSIS — Z9989 Dependence on other enabling machines and devices: Secondary | ICD-10-CM

## 2017-11-06 DIAGNOSIS — E559 Vitamin D deficiency, unspecified: Secondary | ICD-10-CM

## 2017-11-06 DIAGNOSIS — Z6839 Body mass index (BMI) 39.0-39.9, adult: Secondary | ICD-10-CM | POA: Diagnosis not present

## 2017-11-06 MED ORDER — VITAMIN D (ERGOCALCIFEROL) 1.25 MG (50000 UNIT) PO CAPS: 50000.0000 [IU] | ORAL_CAPSULE | ORAL | 0 refills | Status: DC
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
## 2017-11-20 ENCOUNTER — Ambulatory Visit (INDEPENDENT_AMBULATORY_CARE_PROVIDER_SITE_OTHER): Payer: Medicare HMO | Admitting: Physician Assistant

## 2017-11-24 DIAGNOSIS — G4733 Obstructive sleep apnea (adult) (pediatric): Secondary | ICD-10-CM | POA: Diagnosis not present

## 2017-11-30 ENCOUNTER — Encounter: Payer: Self-pay | Admitting: Family Medicine

## 2017-11-30 ENCOUNTER — Other Ambulatory Visit: Payer: Self-pay

## 2017-11-30 ENCOUNTER — Ambulatory Visit (INDEPENDENT_AMBULATORY_CARE_PROVIDER_SITE_OTHER): Payer: Medicare HMO | Admitting: Physician Assistant

## 2017-11-30 ENCOUNTER — Ambulatory Visit (INDEPENDENT_AMBULATORY_CARE_PROVIDER_SITE_OTHER): Payer: Medicare HMO | Admitting: Family Medicine

## 2017-11-30 VITALS — BP 132/74 | HR 75 | Temp 98.4°F | Ht 73.0 in | Wt 308.0 lb

## 2017-11-30 VITALS — BP 110/68 | HR 67 | Temp 97.6°F | Ht 73.0 in | Wt 302.0 lb

## 2017-11-30 DIAGNOSIS — E559 Vitamin D deficiency, unspecified: Secondary | ICD-10-CM | POA: Diagnosis not present

## 2017-11-30 DIAGNOSIS — E8779 Other fluid overload: Secondary | ICD-10-CM

## 2017-11-30 DIAGNOSIS — Z6839 Body mass index (BMI) 39.0-39.9, adult: Secondary | ICD-10-CM

## 2017-11-30 DIAGNOSIS — E669 Obesity, unspecified: Secondary | ICD-10-CM | POA: Diagnosis not present

## 2017-11-30 DIAGNOSIS — I501 Left ventricular failure: Secondary | ICD-10-CM | POA: Diagnosis not present

## 2017-11-30 DIAGNOSIS — E8770 Fluid overload, unspecified: Secondary | ICD-10-CM | POA: Diagnosis not present

## 2017-11-30 MED ORDER — VITAMIN D (ERGOCALCIFEROL) 1.25 MG (50000 UNIT) PO CAPS: 50000.0000 [IU] | ORAL_CAPSULE | ORAL | 0 refills | Status: DC

## 2017-11-30 NOTE — Patient Instructions (Signed)
It was nice meeting you today [name] [name]!  Today, we are getting several labs to check on causes of your fluid overload.  I will call you if any of them are abnormal or if we want you to increase your dose of Lasix.  Try Tylenol rather than ibuprofen for your pain, since ibuprofen can cause swelling.  Please call the clinic if you become short of breath.  If you have any questions or concerns, please feel free to call the clinic.   Be well,  Dr. Shan Levans

## 2017-11-30 NOTE — Assessment & Plan Note (Addendum)
Patient's edema could be due to NSAID use, but due to his history of CHF, will obtain CMP and BNP to see if there are other causes, including heart, kidney, or liver dysfunction.  BNP could be falsely low due to patient's obesity, but will check to see if it is markedly elevated.  Will also obtain CBC since anemia could contribute to edema.  He is not in respiratory distress or appear to have pulmonary edema, so no urgent action is needed.  If it appears that he may benefit from increasing his Lasix dose temporarily, I will contact him to do so.
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
## 2017-12-01 ENCOUNTER — Telehealth: Payer: Self-pay | Admitting: Family Medicine

## 2017-12-01 LAB — COMPREHENSIVE METABOLIC PANEL WITH GFR
ALT: 20 IU/L (ref 0–44)
AST: 19 IU/L (ref 0–40)
Albumin/Globulin Ratio: 2.3 — ABNORMAL HIGH (ref 1.2–2.2)
Albumin: 4.2 g/dL (ref 3.5–4.8)
Alkaline Phosphatase: 68 IU/L (ref 39–117)
BUN/Creatinine Ratio: 21 (ref 10–24)
BUN: 23 mg/dL (ref 8–27)
Bilirubin Total: 0.4 mg/dL (ref 0.0–1.2)
CO2: 23 mmol/L (ref 20–29)
Calcium: 9.3 mg/dL (ref 8.6–10.2)
Chloride: 103 mmol/L (ref 96–106)
Creatinine, Ser: 1.12 mg/dL (ref 0.76–1.27)
GFR calc Af Amer: 76 mL/min/1.73 (ref >59)
GFR calc non Af Amer: 66 mL/min/1.73 (ref >59)
Globulin, Total: 1.8 g/dL (ref 1.5–4.5)
Glucose: 198 mg/dL — ABNORMAL HIGH (ref 65–99)
Potassium: 3.8 mmol/L (ref 3.5–5.2)
Sodium: 143 mmol/L (ref 134–144)
Total Protein: 6 g/dL (ref 6.0–8.5)

## 2017-12-01 LAB — CBC
Hematocrit: 46.8 % (ref 37.5–51.0)
Hemoglobin: 15 g/dL (ref 13.0–17.7)
MCH: 27.7 pg (ref 26.6–33.0)
MCHC: 32.1 g/dL (ref 31.5–35.7)
MCV: 86 fL (ref 79–97)
PLATELETS: 293 10*3/uL (ref 150–379)
RBC: 5.42 x10E6/uL (ref 4.14–5.80)
RDW: 14.6 % (ref 12.3–15.4)
WBC: 7.3 10*3/uL (ref 3.4–10.8)

## 2017-12-01 LAB — BRAIN NATRIURETIC PEPTIDE: BNP: 6.1 pg/mL (ref 0.0–100.0)

## 2017-12-01 NOTE — Telephone Encounter (Signed)
Informed patient that his lab results were overall normal and do not give [name] an answer to why he has increased swelling.  His swelling could be due to ibuprofen use.  He can either continue to watch it and call us if it worsens or does not improve in a few weeks, or he can take an extra Lasix at lunchtime each day for the next few days to see if that helps his symptoms.  Patient expressed understanding.

## 2017-12-04 ENCOUNTER — Other Ambulatory Visit: Payer: Self-pay | Admitting: *Deleted

## 2017-12-04 DIAGNOSIS — E7800 Pure hypercholesterolemia, unspecified: Secondary | ICD-10-CM

## 2017-12-04 DIAGNOSIS — E119 Type 2 diabetes mellitus without complications: Secondary | ICD-10-CM

## 2017-12-04 DIAGNOSIS — I10 Essential (primary) hypertension: Secondary | ICD-10-CM
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
## 2017-12-05 MED ORDER — LOSARTAN POTASSIUM-HCTZ 50-12.5 MG PO TABS: 1.0000 | ORAL_TABLET | Freq: Every day | ORAL | 0 refills | Status: DC

## 2017-12-05 MED ORDER — ROSUVASTATIN CALCIUM 20 MG PO TABS: 20.0000 mg | ORAL_TABLET | Freq: Every day | ORAL | 0 refills | Status: DC

## 2017-12-11 ENCOUNTER — Ambulatory Visit (INDEPENDENT_AMBULATORY_CARE_PROVIDER_SITE_OTHER): Payer: Medicare HMO | Admitting: Family Medicine

## 2017-12-11 ENCOUNTER — Other Ambulatory Visit: Payer: Self-pay

## 2017-12-11 ENCOUNTER — Encounter: Payer: Self-pay | Admitting: Family Medicine

## 2017-12-11 VITALS — BP 129/69 | HR 77 | Temp 98.2°F | Ht 73.0 in | Wt 306.0 lb

## 2017-12-11 DIAGNOSIS — E119 Type 2 diabetes mellitus without complications: Secondary | ICD-10-CM

## 2017-12-11 DIAGNOSIS — S8992XS Unspecified injury of left lower leg, sequela: Secondary | ICD-10-CM | POA: Diagnosis not present

## 2017-12-11 LAB — POCT GLYCOSYLATED HEMOGLOBIN (HGB A1C): HEMOGLOBIN A1C: 6.1

## 2017-12-11 NOTE — Assessment & Plan Note (Signed)
Patient is healing well from his leg injury.  History and exam are not consistent with the presence of a DVT, although he may have thrombophlebitis in his L saphenous vein as a result of this injury.  Patient already takes ASA daily, which may help this to resolve over time.  He was offered venous doppler of his L leg but declined since the probability of DVT was so low.  He was informed of return precautions and agreed to come back if he develops symptoms of DVT.

## 2017-12-11 NOTE — Patient Instructions (Signed)
It was nice seeing you today [name] [name]!  Keep doing an excellent job managing your diabetes - your Hgb A1c was 6.1 today.    You may have a superficial blood clot in your saphenous vein in your left leg, but this is not dangerous and will resolve over time.  You do not have any symptoms or exam findings consistent with a deep venous thrombosis.  Please return if symptoms worsen; otherwise, please return in one year for your yearly physical since you have frequent A1c checks at [facility].  If you have any questions or concerns, please feel free to call the clinic.   Be well,  Dr. Shan Levans

## 2017-12-11 NOTE — Assessment & Plan Note (Signed)
Patient was congratulated on his A1c of 6.1 today and told to continue taking his metformin as prescribed.  He will continue to get his A1c checked every three months at [place] or at this clinic.  Foot exam performed and was normal.

## 2017-12-11 NOTE — Progress Notes (Signed)
Subjective:    [name] [name] - 72 y.o. male MRN [id number]  Date of birth: [birth year]  HPI  [name] [name] is here for follow up of left leg pain after a machine fell on him two months ago.  He says his family is worried that he may have a blood clot due to this injury.  He denies swelling, redness, or pain in his L thigh or calf.  He does endorse hamstring tightness and "feeling like my leg goes to sleep" when sitting for prolonged periods of time.  He has a painful raised area on his medial L leg that was at the location where the machinery fell on him.  He is also here for Hgb A1c check.  He continues to take metformin BID and check his sugars daily.  He has not neuropathy or vision changes.   Health Maintenance:  Health Maintenance Due  Topic Date Due  . FOOT EXAM  10/30/1955  . COLONOSCOPY  11/29/2013  . OPHTHALMOLOGY EXAM  10/26/2017    -  reports that he has never smoked. He has never used smokeless tobacco. - Review of Systems: Per HPI. - Past Medical History: Patient Active Problem List   Diagnosis Date Noted  . Left leg injury, sequela 12/11/2017  . CHF (congestive heart failure), NYHA class II, unspecified failure chronicity, diastolic ([place]) 17/40/8144  . Amaurosis fugax of left eye 10/03/2017  . Carotid artery, internal, occlusion, left 10/03/2017  . OSA (obstructive sleep apnea) 10/03/2017  . Rib pain on right side 08/15/2017  . Elbow pain, left 08/15/2017  . Vitamin D deficiency 03/02/2017  . Ureterolithiasis 12/24/2015  . Type 2 diabetes mellitus without complication, without long-term current use of insulin ([place]) 11/23/2015  . Cervical neck pain with evidence of disc disease   . Acute diastolic heart failure ([place])   . Essential hypertension   . Hypervolemia 11/18/2015  . Cerebrovascular accident (CVA) ([place]) 05/18/2015  . Dysphagia, pharyngeal 03/12/2015  . Symptomatic stenosis of left carotid artery 12/30/2014  . Cervical spondylosis with  radiculopathy 07/15/2014  . OSA on CPAP 11/25/2013  . Prostate cancer ([place]) 07/09/2013  . Squamous cell carcinoma in situ of skin 04/02/2013  . 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 HERNIA, UMBILICAL 81/85/6314  . COLON POLYP 09/28/2006  . HYPERCHOLESTEROLEMIA 09/28/2006  . Obesity 09/28/2006  . ERECTILE DYSFUNCTION 09/28/2006  . Depression 09/28/2006   - Medications: reviewed and updated   Objective:   Physical Exam BP 129/69 (BP Location: Right Arm, Patient Position: Sitting, Cuff Size: Large)   Pulse 77   Temp 98.2 F (36.8 C) (Oral)   Ht 6\' 1"  (1.854 m)   Wt (!) 306 lb (138.8 kg)   SpO2 98%   BMI 40.37 kg/m  Gen: NAD, alert, cooperative with exam, well-appearing HEENT: NCAT, clear conjunctiva, supple neck CV: RRR, good S1/S2, no murmur, no edema, capillary refill brisk, 2+ distal pulses  Resp: CTABL, no wheezes, non-labored Abd: SNTND, no guarding or organomegaly Extremities: raised, tender area on medial L leg just inferior to knee with healing overlying skin.  No erythema, tenderness, or swelling in L thigh or calf Skin: no rashes, normal turgor  Neuro: no gross deficits.  Psych: good insight, alert and oriented  Diabetic Foot Exam - Simple   Simple Foot Form Visual Inspection No deformities, no ulcerations, no other skin breakdown bilaterally:  Yes Sensation Testing Intact to touch and monofilament testing bilaterally:  Yes Pulse Check Posterior Tibialis and Dorsalis pulse intact bilaterally:  Yes  Comments     Assessment & Plan:   Type 2 diabetes mellitus without complication, without long-term current use of insulin ([place]) Patient was congratulated on his A1c of 6.1 today and told to continue taking his metformin as prescribed.  He will continue to get his A1c checked every three months at [place] or at this clinic.  Foot exam performed and was normal.  Left leg injury, sequela Patient is healing well from his leg injury.  History and exam  are not consistent with the presence of a DVT, although he may have thrombophlebitis in his L saphenous vein as a result of this injury.  Patient already takes ASA daily, which may help this to resolve over time.  He was offered venous doppler of his L leg but declined since the probability of DVT was so low.  He was informed of return precautions and agreed to come back if he develops symptoms of DVT.    Maia Breslow, M.D. 12/11/2017, 9:56 AM PGY-1, [place]

## 2017-12-14 ENCOUNTER — Encounter (INDEPENDENT_AMBULATORY_CARE_PROVIDER_SITE_OTHER): Payer: Self-pay | Admitting: Physician Assistant

## 2017-12-14 ENCOUNTER — Encounter: Payer: Self-pay | Admitting: Neurology

## 2017-12-14 ENCOUNTER — Ambulatory Visit (INDEPENDENT_AMBULATORY_CARE_PROVIDER_SITE_OTHER): Payer: Medicare HMO | Admitting: Physician Assistant

## 2017-12-14 VITALS — BP 130/76 | HR 72 | Temp 98.2°F | Ht 73.0 in | Wt 299.0 lb

## 2017-12-14 DIAGNOSIS — Z6839 Body mass index (BMI) 39.0-39.9, adult: Secondary | ICD-10-CM

## 2017-12-14 DIAGNOSIS — M79605 Pain in left leg: Secondary | ICD-10-CM

## 2017-12-14 MED ORDER — ACETAMINOPHEN ER 650 MG PO TBCR: 650.0000 mg | EXTENDED_RELEASE_TABLET | Freq: Three times a day (TID) | ORAL | Status: DC | PRN
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
## 2017-12-21 ENCOUNTER — Encounter: Payer: Self-pay | Admitting: Student in an Organized Health Care Education/Training Program

## 2017-12-24 DIAGNOSIS — G4733 Obstructive sleep apnea (adult) (pediatric): Secondary | ICD-10-CM | POA: Diagnosis not present

## 2018-01-01 ENCOUNTER — Encounter (INDEPENDENT_AMBULATORY_CARE_PROVIDER_SITE_OTHER): Payer: Self-pay | Admitting: Physician Assistant

## 2018-01-01 ENCOUNTER — Ambulatory Visit (INDEPENDENT_AMBULATORY_CARE_PROVIDER_SITE_OTHER): Payer: Medicare HMO | Admitting: Physician Assistant

## 2018-01-01 VITALS — BP 113/78 | HR 85 | Temp 98.3°F | Ht 73.0 in | Wt 299.0 lb

## 2018-01-01 DIAGNOSIS — E559 Vitamin D deficiency, unspecified: Secondary | ICD-10-CM

## 2018-01-01 DIAGNOSIS — E119 Type 2 diabetes mellitus without complications: Secondary | ICD-10-CM

## 2018-01-01 DIAGNOSIS — Z6839 Body mass index (BMI) 39.0-39.9, adult: Secondary | ICD-10-CM

## 2018-01-01 MED ORDER — VITAMIN D (ERGOCALCIFEROL) 1.25 MG (50000 UNIT) PO CAPS: 50000.0000 [IU] | ORAL_CAPSULE | ORAL | 0 refills | Status: DC
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
## 2018-01-06 IMAGING — CR DG CHEST 2V
2 series · 2 of 2 positions shown · non-contrast
Comparison: CT chest and single view of the chest 03/21/2013.

CLINICAL DATA: Left scapula, neck and upper back pain for 5 weeks.
Initial encounter.

EXAM:
CHEST  2 VIEW

[chest pa]
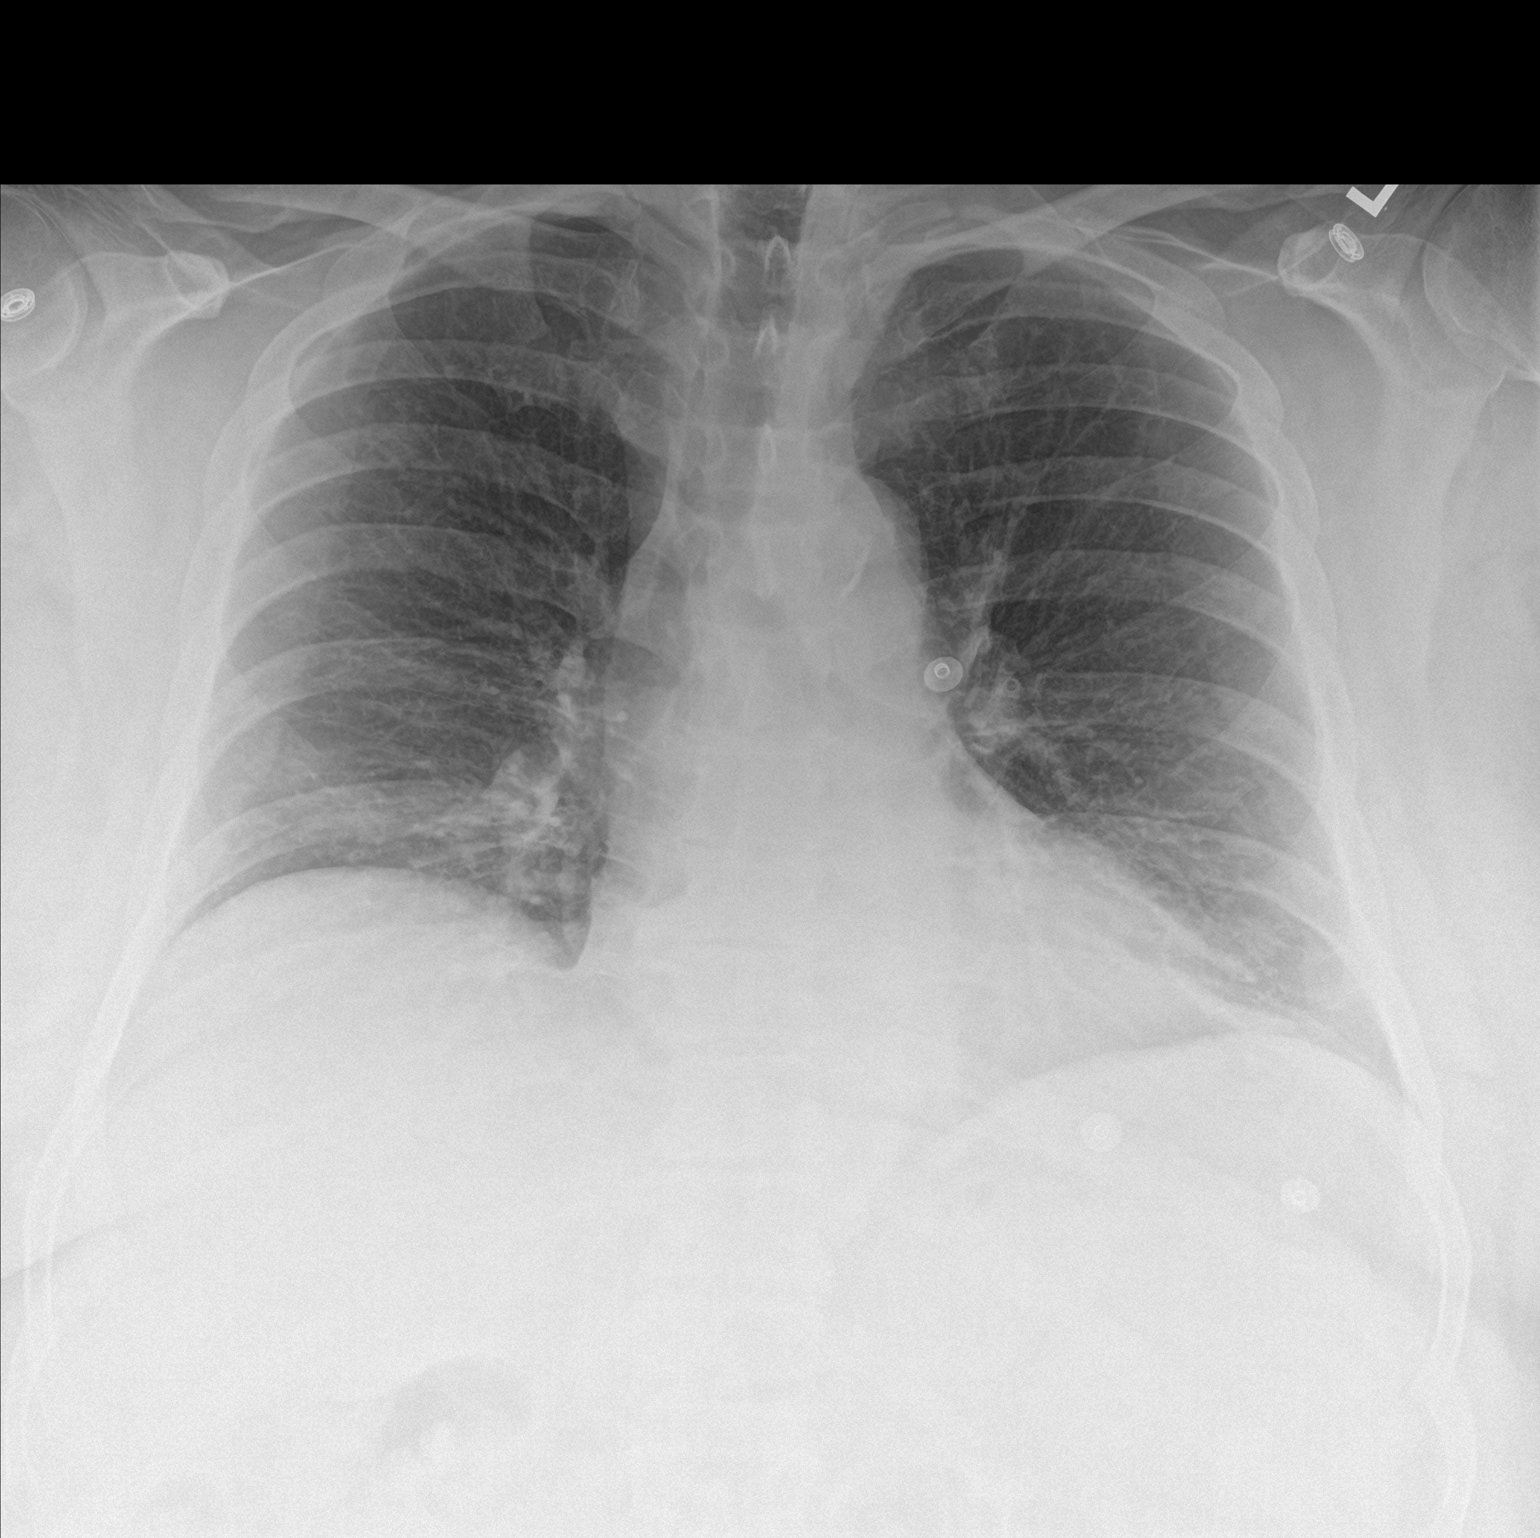

[chest lat]
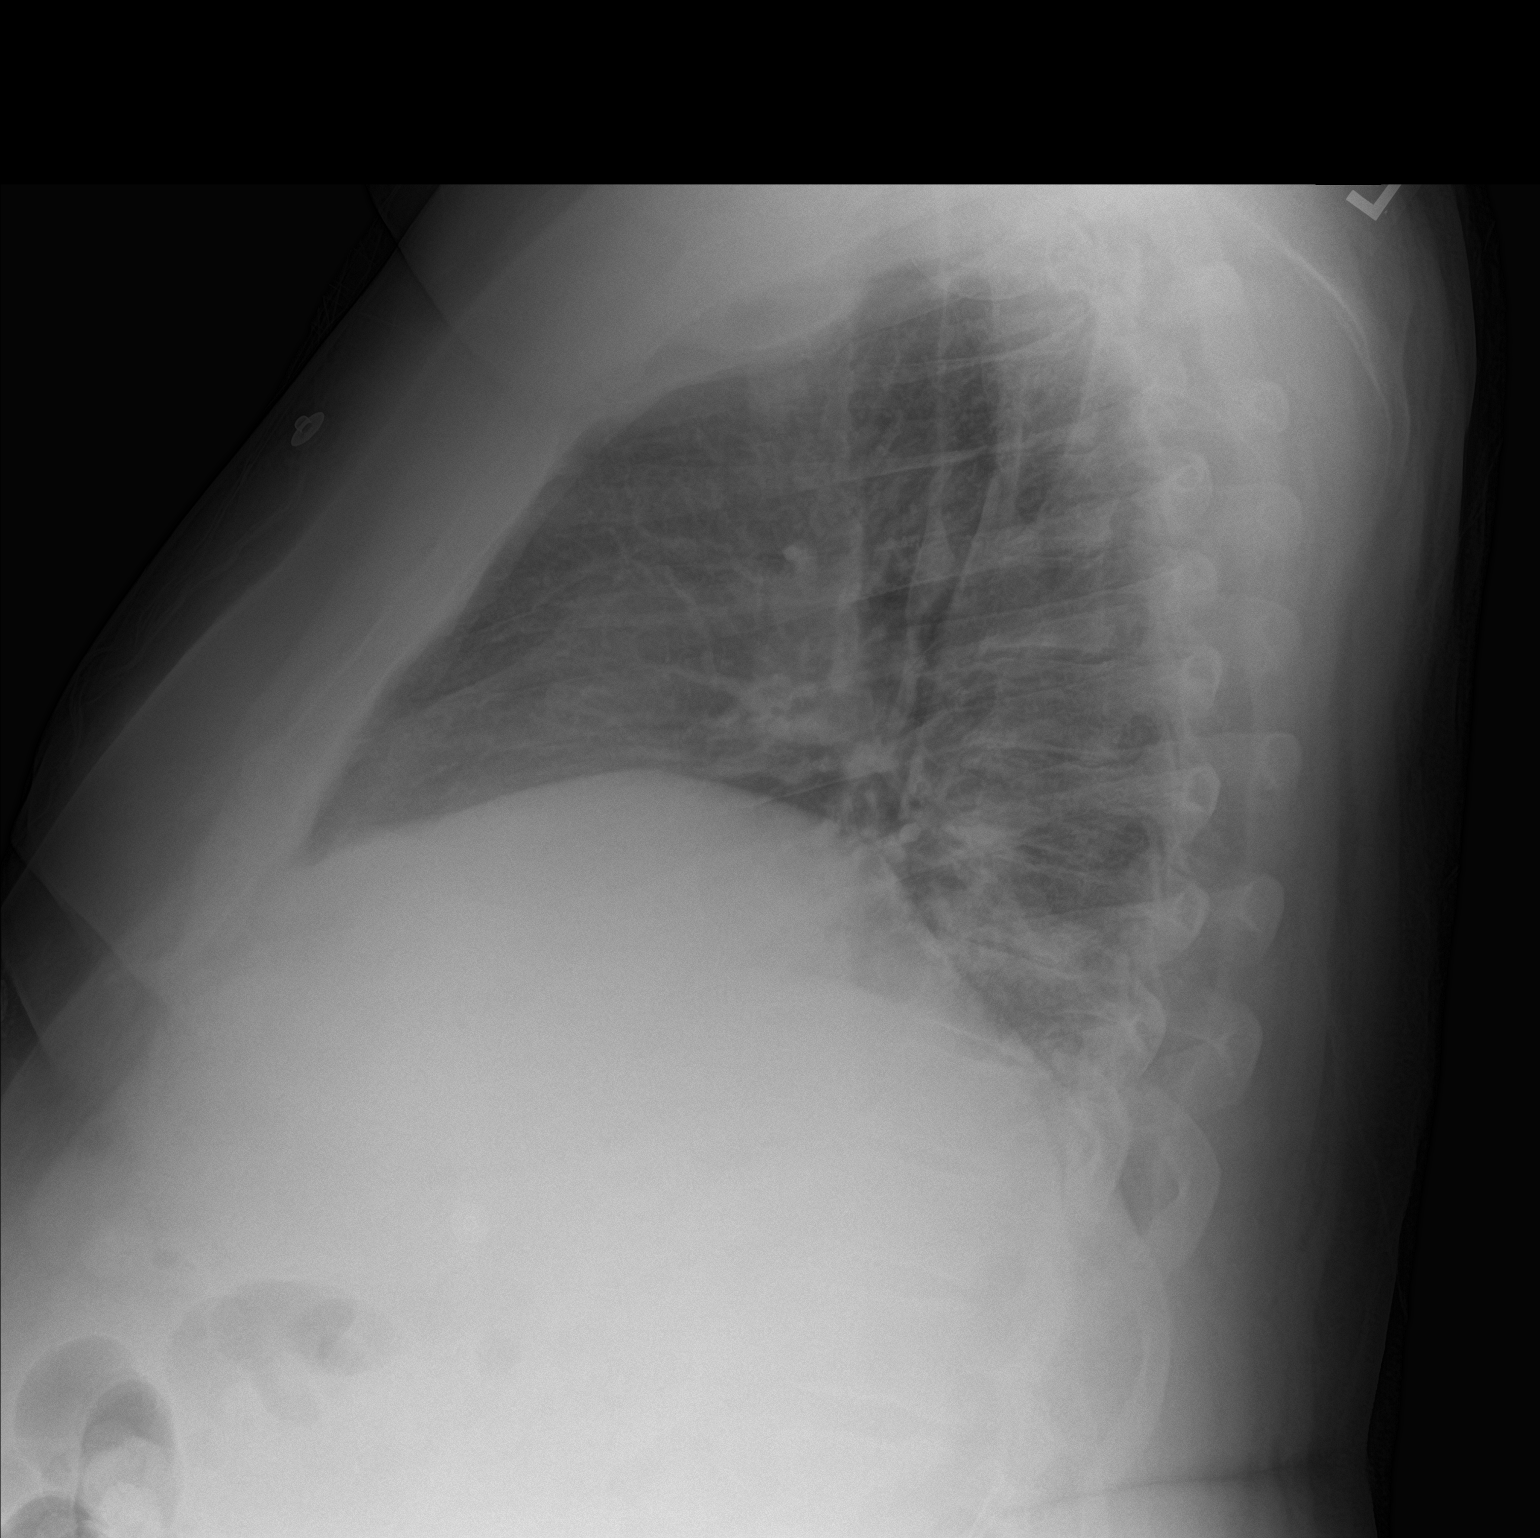

[2 of 2 positions shown; findings below may reference images not displayed]

FINDINGS: Streaky opacities are seen in the left lung base. The right lung is
clear. Heart size is normal. No pneumothorax or pleural effusion.
IMPRESSION: Streaky left basilar airspace opacity could be due to atelectasis or
pneumonia.

## 2018-01-08 ENCOUNTER — Ambulatory Visit: Payer: Medicare HMO | Admitting: Nurse Practitioner

## 2018-01-15 ENCOUNTER — Encounter: Payer: Self-pay | Admitting: Neurology

## 2018-01-17 ENCOUNTER — Ambulatory Visit (INDEPENDENT_AMBULATORY_CARE_PROVIDER_SITE_OTHER): Payer: Medicare HMO | Admitting: Physician Assistant

## 2018-01-17 ENCOUNTER — Ambulatory Visit: Payer: Medicare HMO | Admitting: Neurology

## 2018-01-17 ENCOUNTER — Encounter: Payer: Self-pay | Admitting: Neurology

## 2018-01-17 VITALS — BP 124/69 | HR 74 | Ht 73.0 in | Wt 305.0 lb

## 2018-01-17 VITALS — BP 136/77 | HR 65 | Temp 97.7°F | Ht 73.0 in | Wt 298.0 lb

## 2018-01-17 DIAGNOSIS — Z6839 Body mass index (BMI) 39.0-39.9, adult: Secondary | ICD-10-CM | POA: Diagnosis not present

## 2018-01-17 DIAGNOSIS — E119 Type 2 diabetes mellitus without complications: Secondary | ICD-10-CM

## 2018-01-17 DIAGNOSIS — Z9189 Other specified personal risk factors, not elsewhere classified: Secondary | ICD-10-CM

## 2018-01-17 DIAGNOSIS — E559 Vitamin D deficiency, unspecified: Secondary | ICD-10-CM

## 2018-01-17 DIAGNOSIS — Z6841 Body Mass Index (BMI) 40.0 and over, adult: Secondary | ICD-10-CM

## 2018-01-17 DIAGNOSIS — Z9989 Dependence on other enabling machines and devices: Secondary | ICD-10-CM

## 2018-01-17 DIAGNOSIS — G4733 Obstructive sleep apnea (adult) (pediatric): Secondary | ICD-10-CM

## 2018-01-17 MED ORDER — VITAMIN D (ERGOCALCIFEROL) 1.25 MG (50000 UNIT) PO CAPS: 50000.0000 [IU] | ORAL_CAPSULE | ORAL | 0 refills | Status: DC
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
## 2018-01-24 DIAGNOSIS — G4733 Obstructive sleep apnea (adult) (pediatric): Secondary | ICD-10-CM | POA: Diagnosis not present

## 2018-01-30 ENCOUNTER — Ambulatory Visit (INDEPENDENT_AMBULATORY_CARE_PROVIDER_SITE_OTHER): Payer: Medicare HMO | Admitting: Physician Assistant

## 2018-01-30 ENCOUNTER — Encounter (INDEPENDENT_AMBULATORY_CARE_PROVIDER_SITE_OTHER): Payer: Self-pay | Admitting: Physician Assistant

## 2018-01-30 VITALS — BP 120/70 | HR 70 | Temp 98.3°F | Ht 73.0 in | Wt 297.0 lb

## 2018-01-30 DIAGNOSIS — E119 Type 2 diabetes mellitus without complications: Secondary | ICD-10-CM | POA: Diagnosis not present

## 2018-01-30 DIAGNOSIS — Z6839 Body mass index (BMI) 39.0-39.9, adult: Secondary | ICD-10-CM | POA: Diagnosis not present
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
## 2018-02-04 IMAGING — DX DG CHEST 2V
2 series · 2 of 2 positions shown · non-contrast
Comparison: 10/20/2015

CLINICAL DATA: Worsening chest pain and shortness of breath for 4
weeks.

EXAM:
CHEST  2 VIEW

[chest pa]
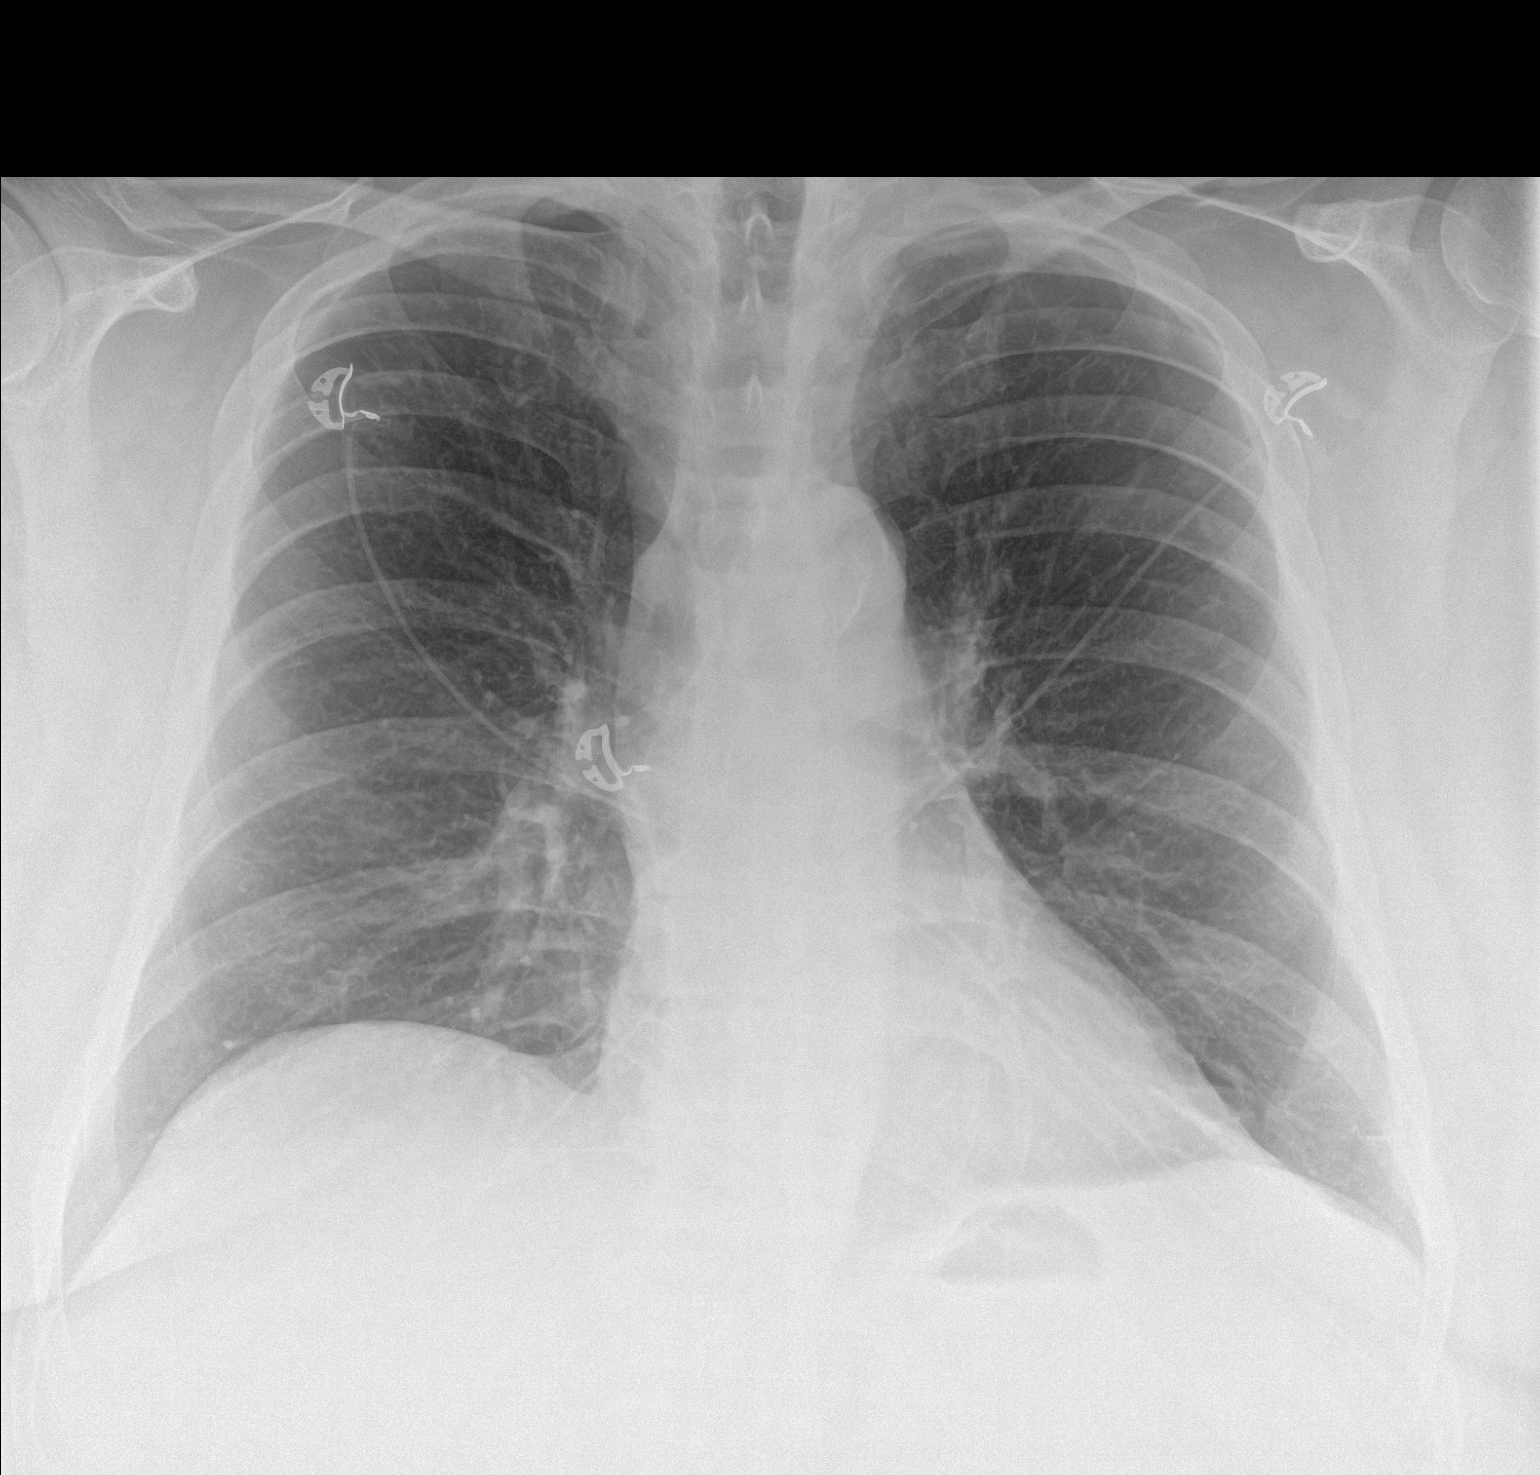

[chest lat]
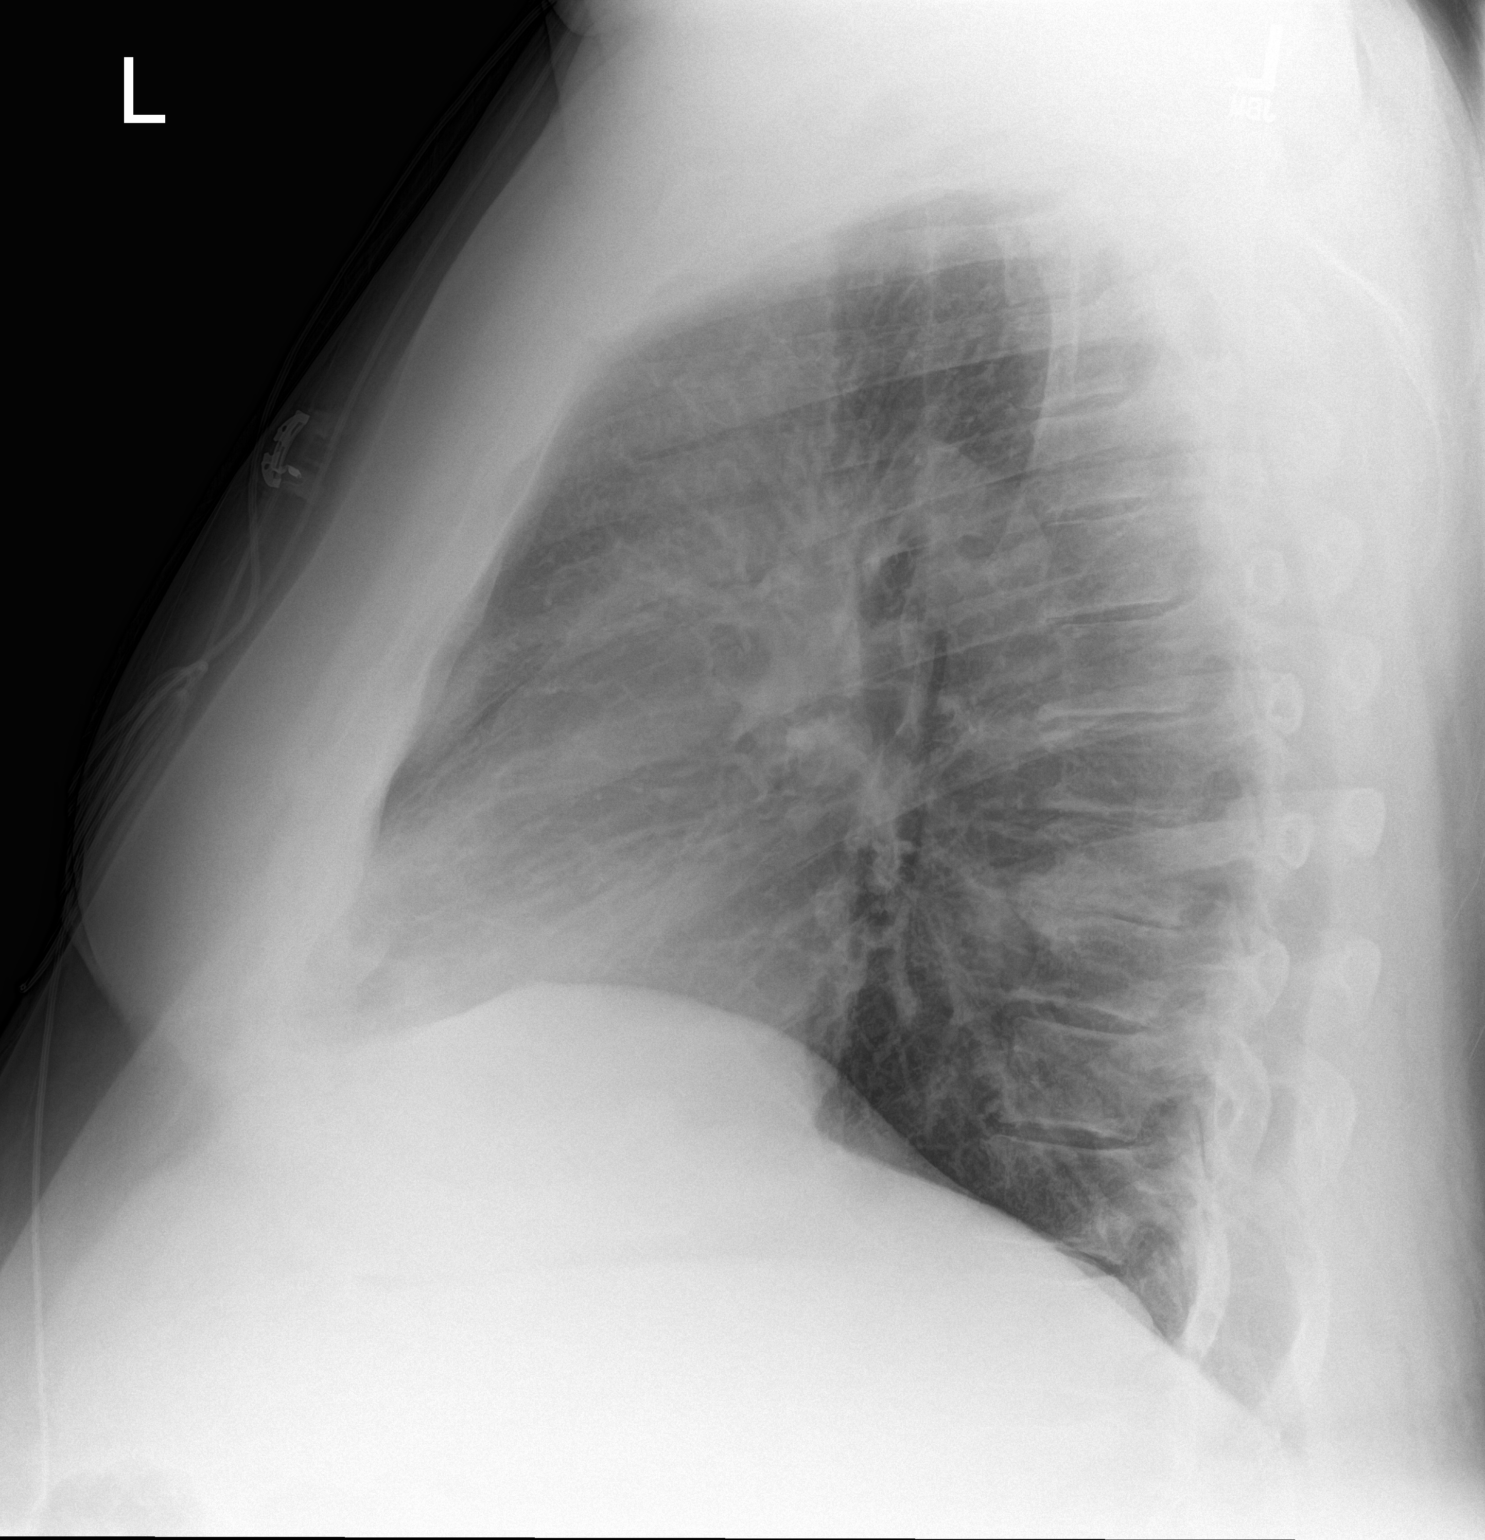

[2 of 2 positions shown; findings below may reference images not displayed]

FINDINGS: The heart size and mediastinal contours are within normal limits.
Improved aeration of both lungs is seen. Mild scarring again noted
in the left lung base. No evidence of pulmonary infiltrate or edema.
No evidence of pneumothorax or pleural effusion. Thoracic spine
degenerative changes again noted.
IMPRESSION: No active cardiopulmonary disease.

## 2018-02-15 DIAGNOSIS — G4733 Obstructive sleep apnea (adult) (pediatric): Secondary | ICD-10-CM | POA: Diagnosis not present

## 2018-02-22 ENCOUNTER — Encounter: Payer: Medicare HMO | Admitting: Student in an Organized Health Care Education/Training Program

## 2018-02-22 ENCOUNTER — Encounter (INDEPENDENT_AMBULATORY_CARE_PROVIDER_SITE_OTHER): Payer: Self-pay | Admitting: Family Medicine

## 2018-02-22 ENCOUNTER — Ambulatory Visit (INDEPENDENT_AMBULATORY_CARE_PROVIDER_SITE_OTHER): Payer: Medicare HMO | Admitting: Family Medicine

## 2018-02-22 VITALS — BP 129/73 | HR 68 | Temp 97.9°F | Ht 73.0 in | Wt 305.0 lb

## 2018-02-22 DIAGNOSIS — Z6841 Body Mass Index (BMI) 40.0 and over, adult: Secondary | ICD-10-CM | POA: Diagnosis not present

## 2018-02-22 DIAGNOSIS — E119 Type 2 diabetes mellitus without complications: Secondary | ICD-10-CM | POA: Diagnosis not present

## 2018-02-22 DIAGNOSIS — E559 Vitamin D deficiency, unspecified: Secondary | ICD-10-CM

## 2018-02-22 DIAGNOSIS — E66813 Obesity, class 3: Secondary | ICD-10-CM

## 2018-02-22 MED ORDER — VITAMIN D (ERGOCALCIFEROL) 1.25 MG (50000 UNIT) PO CAPS: 50000.0000 [IU] | ORAL_CAPSULE | ORAL | 0 refills | Status: DC
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
## 2018-02-23 DIAGNOSIS — G4733 Obstructive sleep apnea (adult) (pediatric): Secondary | ICD-10-CM | POA: Diagnosis not present

## 2018-02-26 ENCOUNTER — Other Ambulatory Visit: Payer: Self-pay | Admitting: Student in an Organized Health Care Education/Training Program

## 2018-02-26 DIAGNOSIS — E119 Type 2 diabetes mellitus without complications: Secondary | ICD-10-CM

## 2018-02-26 DIAGNOSIS — I10 Essential (primary) hypertension: Secondary | ICD-10-CM

## 2018-02-26 DIAGNOSIS — E7800 Pure hypercholesterolemia, unspecified: Secondary | ICD-10-CM

## 2018-02-27 ENCOUNTER — Ambulatory Visit: Payer: Medicare HMO | Admitting: Nurse Practitioner

## 2018-03-08 ENCOUNTER — Ambulatory Visit (INDEPENDENT_AMBULATORY_CARE_PROVIDER_SITE_OTHER): Payer: Medicare HMO | Admitting: Physician Assistant

## 2018-03-08 ENCOUNTER — Encounter (INDEPENDENT_AMBULATORY_CARE_PROVIDER_SITE_OTHER): Payer: Self-pay | Admitting: Physician Assistant

## 2018-03-08 VITALS — BP 120/75 | HR 69 | Temp 98.0°F | Ht 73.0 in | Wt 299.0 lb

## 2018-03-08 DIAGNOSIS — Z6839 Body mass index (BMI) 39.0-39.9, adult: Secondary | ICD-10-CM

## 2018-03-08 DIAGNOSIS — E119 Type 2 diabetes mellitus without complications: Secondary | ICD-10-CM | POA: Diagnosis not present

## 2018-03-08 DIAGNOSIS — E559 Vitamin D deficiency, unspecified: Secondary | ICD-10-CM | POA: Diagnosis not present

## 2018-03-08 MED ORDER — VITAMIN D (ERGOCALCIFEROL) 1.25 MG (50000 UNIT) PO CAPS
50000.0000 [IU] | ORAL_CAPSULE | ORAL | 0 refills | Status: DC
Start: 2018-03-08 — End: 2018-03-27

## 2018-03-08 MED ORDER — METFORMIN HCL 1000 MG PO TABS: ORAL_TABLET | ORAL | 0 refills | Status: DC

## 2018-03-09 ENCOUNTER — Encounter: Payer: Self-pay | Admitting: Student in an Organized Health Care Education/Training Program

## 2018-03-09 ENCOUNTER — Ambulatory Visit (INDEPENDENT_AMBULATORY_CARE_PROVIDER_SITE_OTHER): Payer: Medicare HMO | Admitting: Student in an Organized Health Care Education/Training Program

## 2018-03-09 ENCOUNTER — Other Ambulatory Visit: Payer: Self-pay

## 2018-03-09 VITALS — BP 142/70 | HR 80 | Temp 98.3°F | Ht 73.0 in | Wt 309.4 lb

## 2018-03-09 DIAGNOSIS — F329 Major depressive disorder, single episode, unspecified: Secondary | ICD-10-CM | POA: Diagnosis not present

## 2018-03-09 DIAGNOSIS — F32A Depression, unspecified: Secondary | ICD-10-CM

## 2018-03-09 DIAGNOSIS — E119 Type 2 diabetes mellitus without complications: Secondary | ICD-10-CM | POA: Diagnosis not present

## 2018-03-09 DIAGNOSIS — Z0000 Encounter for general adult medical examination without abnormal findings: Secondary | ICD-10-CM | POA: Diagnosis not present

## 2018-03-09 LAB — POCT GLYCOSYLATED HEMOGLOBIN (HGB A1C): HbA1c, POC (controlled diabetic range): 6 % (ref 0.0–7.0)

## 2018-03-09 MED ORDER — SERTRALINE HCL 50 MG PO TABS: 50.0000 mg | ORAL_TABLET | Freq: Every day | ORAL | 3 refills | Status: DC

## 2018-03-09 NOTE — Assessment & Plan Note (Signed)
-   ordered shingrix - release of information to obtain colonoscopy records

## 2018-03-09 NOTE — Assessment & Plan Note (Signed)
PHQ9 7 today, no suicidal or homicidal ideation. He is interested in medication to see if this will help.  [facility] information provided, patient would like to continue considering this - sertraline (ZOLOFT) 50 MG tablet; Take 1 tablet (50 mg total) by mouth daily.  Dispense: 30 tablet; Refill: 3 - follow up in 2-4 weeks, consider increasing dose of zoloft if needed - return to care sooner or call us if depressive symptoms worsen

## 2018-03-09 NOTE — Assessment & Plan Note (Signed)
A1c controleld at 6.0. Goal would be A1c 7.0 for this patient. - decrease metformin to 1000 mg ONCE daily from twice daily - follow up A1c in 3 months

## 2018-03-09 NOTE — Patient Instructions (Signed)
It was a pleasure seeing you today in our clinic.   Please schedule follow up to be seen in one month for your mood.  Our clinic's number is [phone]. Please call with questions or concerns about what we discussed today.  Be well, Dr. Burr Medico

## 2018-03-09 NOTE — Progress Notes (Signed)
[name] Coombe, MD MS Phone: [phone]  Subjective:  CC -- Annual Physical; With follow up diabetes and new depressive state   Diabetes - A1c 6.0 from 6.1 when previously checked 2 months ago.  Meds: Metformin 1000 mg qhs. No polyuria/polydipsia/polyphagia. He is taking statin and an ACE. He is taking ASA.  Feelings of depression -  Patient reports he's had difficulty getting moving and he thinks there may be a component of depression. His main complaints are low energy and poor concentration. 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  Depression screen [facility] 2/9 03/09/2018 03/09/2018 12/11/2017  Decreased Interest 2 1 0  Down, Depressed, Hopeless 2 1 0  PHQ - 2 Score 4 2 0  Altered sleeping 0 - -  Tired, decreased energy 2 - -  Change in appetite 0 - -  Feeling bad or failure about yourself  0 - -  Trouble concentrating 1 - -  Moving slowly or fidgety/restless 0 - -  Suicidal thoughts 0 - -  PHQ-9 Score 7 - -  Difficult doing work/chores Somewhat difficult - -     General Healthcare: Medication Compliance: yes  Cardiac Risk as of 04/2017 - high intensity statin recommended Aspirin: yes Dx Hypertension: yes  Dx Hyperlipidemia: yes  Diabetes: yes  Dx Obesity: yes, Body mass index is 40.82 kg/m. Class III obesity Weight Loss: yes, intentional, follows with healthy weight and wellness Physical Activity: yes, walking a little, uses resistance straps  To exercise  Social: Alcohol Use: no  Tobacco Use: no  Other Drugs: no  Independence: yes  Cancer:  Colorectal >> Colonoscopy: this is not in our system but patient feels certain he had it done about one year ago. Release of information forms signed today to be sent to [facility]. Lung >> Tobacco Use: no  Skin >> Suspicious lesions: no   Other: Zoster Vaccine:ordered for patient to get at his pharmacy Flu Vaccine: patient to return when flu vaccine becomes  available  Objective: BP (!) 142/70   Pulse 80   Temp 98.3 F (36.8 C) (Oral)   Ht 6\' 1"  (1.854 m)   Wt (!) 309 lb 6.4 oz (140.3 kg)   SpO2 94%   BMI 40.82 kg/m  Gen: NAD, alert, cooperative with exam HEENT: NCAT, EOMI, PERRL CV: RRR, good S1/S2, no murmur Resp: CTABL, no wheezes, non-labored Abd: Soft, Non Tender, Non Distended, BS present, no guarding or organomegaly Genital Exam: not done Ext: No edema, warm Neuro: Alert and oriented, No gross deficits   Assessment/Plan:  Type 2 diabetes mellitus without complication, without long-term current use of insulin (HCC) A1c controleld at 6.0. Goal would be A1c 7.0 for this patient. - decrease metformin to 1000 mg ONCE daily from twice daily - follow up A1c in 3 months  Depression PHQ9 7 today, no suicidal or homicidal ideation. He is interested in medication to see if this will help.  [facility] information provided, patient would like to continue considering this - sertraline (ZOLOFT) 50 MG tablet; Take 1 tablet (50 mg total) by mouth daily.  Dispense: 30 tablet; Refill: 3 - follow up in 2-4 weeks, consider increasing dose of zoloft if needed - return to care sooner or call us if depressive symptoms worsen   Healthcare maintenance - ordered shingrix - release of information to obtain colonoscopy records   Orders Placed This Encounter  Procedures  . POCT glycosylated hemoglobin (  Hb A1C)    Meds ordered this encounter  Medications  . sertraline (ZOLOFT) 50 MG tablet    Sig: Take 1 tablet (50 mg total) by mouth daily.    Dispense:  30 tablet    Refill:  3    [name] Coombe, MD MS PGY2 03/09/2018 4:25 PM
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
## 2018-03-26 DIAGNOSIS — G4733 Obstructive sleep apnea (adult) (pediatric): Secondary | ICD-10-CM | POA: Diagnosis not present

## 2018-03-27 ENCOUNTER — Ambulatory Visit (INDEPENDENT_AMBULATORY_CARE_PROVIDER_SITE_OTHER): Payer: Medicare HMO | Admitting: Physician Assistant

## 2018-03-27 ENCOUNTER — Encounter (INDEPENDENT_AMBULATORY_CARE_PROVIDER_SITE_OTHER): Payer: Self-pay | Admitting: Physician Assistant

## 2018-03-27 VITALS — BP 133/77 | HR 58 | Temp 97.9°F | Ht 73.0 in | Wt 300.0 lb

## 2018-03-27 DIAGNOSIS — E119 Type 2 diabetes mellitus without complications: Secondary | ICD-10-CM | POA: Diagnosis not present

## 2018-03-27 DIAGNOSIS — E559 Vitamin D deficiency, unspecified: Secondary | ICD-10-CM | POA: Diagnosis not present

## 2018-03-27 DIAGNOSIS — Z6839 Body mass index (BMI) 39.0-39.9, adult: Secondary | ICD-10-CM | POA: Diagnosis not present

## 2018-03-27 MED ORDER — VITAMIN D (ERGOCALCIFEROL) 1.25 MG (50000 UNIT) PO CAPS: 50000.0000 [IU] | ORAL_CAPSULE | ORAL | 0 refills | Status: DC
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
## 2018-04-09 ENCOUNTER — Other Ambulatory Visit: Payer: Self-pay | Admitting: Student in an Organized Health Care Education/Training Program

## 2018-04-16 ENCOUNTER — Other Ambulatory Visit: Payer: Self-pay

## 2018-04-16 ENCOUNTER — Ambulatory Visit (INDEPENDENT_AMBULATORY_CARE_PROVIDER_SITE_OTHER): Payer: Medicare HMO | Admitting: Student in an Organized Health Care Education/Training Program

## 2018-04-16 ENCOUNTER — Encounter: Payer: Self-pay | Admitting: Student in an Organized Health Care Education/Training Program

## 2018-04-16 VITALS — BP 128/82 | HR 58 | Temp 98.7°F | Ht 73.0 in | Wt 314.0 lb

## 2018-04-16 DIAGNOSIS — Z23 Encounter for immunization: Secondary | ICD-10-CM

## 2018-04-16 DIAGNOSIS — F4323 Adjustment disorder with mixed anxiety and depressed mood: Secondary | ICD-10-CM | POA: Diagnosis not present

## 2018-04-16 NOTE — Progress Notes (Signed)
   CC: Follow up new SSRI   HPI: [name] [name] is a 72 y.o. male with PMH significant for diabetes, hypertension, CHF, who previously presented for depressed mood who presents to [facility] today for follow-up depressed mood.    Since his last visit patient reports he has tolerated zoloft 50 mg well and has seen improvement in his mood symptoms. 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 However, he reports he is coping well. His PHQ9 score has improved today. He denies current suicidal or homicidal ideation.   Depression screen [facility] 2/9 04/16/2018 03/09/2018 03/09/2018  Decreased Interest 1 2 1   Down, Depressed, Hopeless 1 2 1   PHQ - 2 Score 2 4 2   Altered sleeping 0 0 -  Tired, decreased energy 1 2 -  Change in appetite 0 0 -  Feeling bad or failure about yourself  0 0 -  Trouble concentrating 0 1 -  Moving slowly or fidgety/restless 0 0 -  Suicidal thoughts 0 0 -  PHQ-9 Score 3 7 -  Difficult doing work/chores Not difficult at all Somewhat difficult -  Some recent data might be hidden    Review of Symptoms:  See HPI for ROS.   CC, SH/smoking status, and VS noted.  Objective: BP 128/82   Pulse (!) 58   Temp 98.7 F (37.1 C) (Oral)   Ht 6\' 1"  (1.854 m)   Wt (!) 314 lb (142.4 kg)   SpO2 98%   BMI 41.43 kg/m  GEN: NAD, alert, cooperative, and pleasant. RESPIRATORY: comfortable work of breathing LY:YTKPTWS rate noted GI: nondistended SKIN: warm and dry, no rashes or lesions NEURO: II-XII grossly intact PSYCH: AAOx3, appropriate affect  Assessment and plan:  Depressed mood  - improved - continue zoloft 50 mg daily - reasons to call or return to care were discussed - no red flags for suicidality, overall patient seems to be doing well - would reassess in 3-6 months to see if this can be discontinued in the future  Orders Placed This Encounter  Procedures  . Flu Vaccine QUAD 36+ mos IM    No  orders of the defined types were placed in this encounter.    Everrett Coombe, MD,MS,  PGY3 04/16/2018 10:37 AM

## 2018-04-16 NOTE — Patient Instructions (Signed)
It was a pleasure seeing you today in our clinic.   Our clinic's number is [phone]. Please call with questions or concerns about what we discussed today.  Be well, Dr. Alyana Kreiter   

## 2018-04-17 ENCOUNTER — Encounter (INDEPENDENT_AMBULATORY_CARE_PROVIDER_SITE_OTHER): Payer: Self-pay | Admitting: Physician Assistant

## 2018-04-17 ENCOUNTER — Ambulatory Visit (INDEPENDENT_AMBULATORY_CARE_PROVIDER_SITE_OTHER): Payer: Medicare HMO | Admitting: Physician Assistant

## 2018-04-17 VITALS — BP 135/80 | HR 58 | Temp 97.6°F | Ht 73.0 in | Wt 303.0 lb

## 2018-04-17 DIAGNOSIS — Z6841 Body Mass Index (BMI) 40.0 and over, adult: Secondary | ICD-10-CM | POA: Diagnosis not present

## 2018-04-17 DIAGNOSIS — E559 Vitamin D deficiency, unspecified: Secondary | ICD-10-CM | POA: Diagnosis not present
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
## 2018-04-20 ENCOUNTER — Encounter: Payer: Self-pay | Admitting: Student in an Organized Health Care Education/Training Program

## 2018-04-26 DIAGNOSIS — G4733 Obstructive sleep apnea (adult) (pediatric): Secondary | ICD-10-CM | POA: Diagnosis not present

## 2018-05-03 ENCOUNTER — Ambulatory Visit (INDEPENDENT_AMBULATORY_CARE_PROVIDER_SITE_OTHER): Payer: Medicare HMO | Admitting: Physician Assistant

## 2018-05-03 ENCOUNTER — Encounter (INDEPENDENT_AMBULATORY_CARE_PROVIDER_SITE_OTHER): Payer: Self-pay | Admitting: Physician Assistant

## 2018-05-03 VITALS — BP 125/63 | HR 64 | Temp 98.4°F | Ht 73.0 in | Wt 303.0 lb

## 2018-05-03 DIAGNOSIS — Z6841 Body Mass Index (BMI) 40.0 and over, adult: Secondary | ICD-10-CM

## 2018-05-03 DIAGNOSIS — E7849 Other hyperlipidemia: Secondary | ICD-10-CM

## 2018-05-03 DIAGNOSIS — E559 Vitamin D deficiency, unspecified: Secondary | ICD-10-CM | POA: Diagnosis not present

## 2018-05-03 DIAGNOSIS — E119 Type 2 diabetes mellitus without complications: Secondary | ICD-10-CM | POA: Diagnosis not present

## 2018-05-03 MED ORDER — VITAMIN D (ERGOCALCIFEROL) 1.25 MG (50000 UNIT) PO CAPS
50000.0000 [IU] | ORAL_CAPSULE | ORAL | 0 refills | Status: DC
Start: 2018-05-03 — End: 2018-06-07
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
## 2018-05-04 LAB — COMPREHENSIVE METABOLIC PANEL
A/G RATIO: 2.3 — AB (ref 1.2–2.2)
ALBUMIN: 4.6 g/dL (ref 3.5–4.8)
ALK PHOS: 59 IU/L (ref 39–117)
ALT: 18 IU/L (ref 0–44)
AST: 14 IU/L (ref 0–40)
BILIRUBIN TOTAL: 0.3 mg/dL (ref 0.0–1.2)
BUN / CREAT RATIO: 15 (ref 10–24)
BUN: 20 mg/dL (ref 8–27)
CALCIUM: 9.5 mg/dL (ref 8.6–10.2)
CHLORIDE: 103 mmol/L (ref 96–106)
CO2: 26 mmol/L (ref 20–29)
Creatinine, Ser: 1.3 mg/dL — ABNORMAL HIGH (ref 0.76–1.27)
GFR calc Af Amer: 63 mL/min/{1.73_m2} (ref >59)
GFR calc non Af Amer: 55 mL/min/{1.73_m2} — ABNORMAL LOW (ref >59)
GLOBULIN, TOTAL: 2 g/dL (ref 1.5–4.5)
Glucose: 128 mg/dL — ABNORMAL HIGH (ref 65–99)
Potassium: 4.1 mmol/L (ref 3.5–5.2)
Sodium: 143 mmol/L (ref 134–144)
Total Protein: 6.6 g/dL (ref 6.0–8.5)

## 2018-05-04 LAB — LIPID PANEL WITH LDL/HDL RATIO
Cholesterol, Total: 167 mg/dL (ref 100–199)
HDL: 39 mg/dL — ABNORMAL LOW (ref >39)
LDL Calculated: 111 mg/dL — ABNORMAL HIGH (ref 0–99)
LDl/HDL Ratio: 2.8 ratio (ref 0.0–3.6)
Triglycerides: 87 mg/dL (ref 0–149)
VLDL Cholesterol Cal: 17 mg/dL (ref 5–40)

## 2018-05-04 LAB — HEMOGLOBIN A1C
Est. average glucose Bld gHb Est-mCnc: 137 mg/dL
HEMOGLOBIN A1C: 6.4 % — AB (ref 4.8–5.6)

## 2018-05-04 LAB — VITAMIN D 25 HYDROXY (VIT D DEFICIENCY, FRACTURES): Vit D, 25-Hydroxy: 66.1 ng/mL (ref 30.0–100.0)

## 2018-05-04 LAB — INSULIN, RANDOM: INSULIN: 13.7 u[IU]/mL (ref 2.6–24.9)

## 2018-05-09 ENCOUNTER — Telehealth (INDEPENDENT_AMBULATORY_CARE_PROVIDER_SITE_OTHER): Payer: Self-pay

## 2018-05-09 NOTE — Telephone Encounter (Signed)
Spoke with the patient and informed him of his Vit D level that was drawn on 05/03/18. Also informed him because his level is at goal to start taking the vit D every other week per PA Tracey. Pt verbalized understanding. April, [place]

## 2018-05-17 ENCOUNTER — Encounter (INDEPENDENT_AMBULATORY_CARE_PROVIDER_SITE_OTHER): Payer: Self-pay | Admitting: Physician Assistant

## 2018-05-17 ENCOUNTER — Ambulatory Visit (INDEPENDENT_AMBULATORY_CARE_PROVIDER_SITE_OTHER): Payer: Medicare HMO | Admitting: Physician Assistant

## 2018-05-17 VITALS — BP 132/81 | HR 67 | Temp 97.6°F | Ht 73.0 in | Wt 301.0 lb

## 2018-05-17 DIAGNOSIS — Z6839 Body mass index (BMI) 39.0-39.9, adult: Secondary | ICD-10-CM

## 2018-05-17 DIAGNOSIS — E119 Type 2 diabetes mellitus without complications: Secondary | ICD-10-CM

## 2018-05-17 DIAGNOSIS — E7849 Other hyperlipidemia: Secondary | ICD-10-CM

## 2018-05-17 MED ORDER — ROSUVASTATIN CALCIUM 40 MG PO TABS: 40.0000 mg | ORAL_TABLET | Freq: Every day | ORAL | 0 refills | Status: DC

## 2018-05-18 ENCOUNTER — Other Ambulatory Visit: Payer: Self-pay | Admitting: Student in an Organized Health Care Education/Training Program
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
## 2018-05-26 ENCOUNTER — Other Ambulatory Visit: Payer: Self-pay | Admitting: Student in an Organized Health Care Education/Training Program

## 2018-05-26 ENCOUNTER — Other Ambulatory Visit (INDEPENDENT_AMBULATORY_CARE_PROVIDER_SITE_OTHER): Payer: Self-pay | Admitting: Physician Assistant

## 2018-05-26 DIAGNOSIS — I10 Essential (primary) hypertension: Secondary | ICD-10-CM

## 2018-05-26 DIAGNOSIS — E119 Type 2 diabetes mellitus without complications: Secondary | ICD-10-CM

## 2018-05-26 DIAGNOSIS — G4733 Obstructive sleep apnea (adult) (pediatric): Secondary | ICD-10-CM | POA: Diagnosis not present

## 2018-06-07 ENCOUNTER — Ambulatory Visit (INDEPENDENT_AMBULATORY_CARE_PROVIDER_SITE_OTHER): Payer: Medicare HMO | Admitting: Physician Assistant

## 2018-06-07 ENCOUNTER — Encounter (INDEPENDENT_AMBULATORY_CARE_PROVIDER_SITE_OTHER): Payer: Self-pay | Admitting: Physician Assistant

## 2018-06-07 VITALS — BP 143/82 | HR 61 | Temp 97.7°F | Ht 73.0 in | Wt 304.0 lb

## 2018-06-07 DIAGNOSIS — E119 Type 2 diabetes mellitus without complications: Secondary | ICD-10-CM

## 2018-06-07 DIAGNOSIS — Z6841 Body Mass Index (BMI) 40.0 and over, adult: Secondary | ICD-10-CM | POA: Diagnosis not present

## 2018-06-07 DIAGNOSIS — E559 Vitamin D deficiency, unspecified: Secondary | ICD-10-CM

## 2018-06-07 MED ORDER — VITAMIN D (ERGOCALCIFEROL) 1.25 MG (50000 UNIT) PO CAPS: 50000.0000 [IU] | ORAL_CAPSULE | ORAL | 0 refills | Status: DC
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
## 2018-06-16 DIAGNOSIS — R05 Cough: Secondary | ICD-10-CM | POA: Diagnosis not present

## 2018-06-16 DIAGNOSIS — I700 Atherosclerosis of aorta: Secondary | ICD-10-CM | POA: Diagnosis not present

## 2018-06-26 DIAGNOSIS — G4733 Obstructive sleep apnea (adult) (pediatric): Secondary | ICD-10-CM | POA: Diagnosis not present

## 2018-06-27 ENCOUNTER — Encounter (INDEPENDENT_AMBULATORY_CARE_PROVIDER_SITE_OTHER): Payer: Self-pay | Admitting: Physician Assistant

## 2018-06-27 ENCOUNTER — Ambulatory Visit (INDEPENDENT_AMBULATORY_CARE_PROVIDER_SITE_OTHER): Payer: Medicare HMO | Admitting: Physician Assistant

## 2018-06-27 VITALS — BP 133/80 | HR 67 | Temp 98.0°F | Ht 73.0 in | Wt 303.0 lb

## 2018-06-27 DIAGNOSIS — Z6841 Body Mass Index (BMI) 40.0 and over, adult: Secondary | ICD-10-CM

## 2018-06-27 DIAGNOSIS — E119 Type 2 diabetes mellitus without complications: Secondary | ICD-10-CM | POA: Diagnosis not present

## 2018-06-29 ENCOUNTER — Other Ambulatory Visit (INDEPENDENT_AMBULATORY_CARE_PROVIDER_SITE_OTHER): Payer: Self-pay | Admitting: Physician Assistant

## 2018-06-29 DIAGNOSIS — E7849 Other hyperlipidemia: Secondary | ICD-10-CM
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
## 2018-07-06 ENCOUNTER — Telehealth: Payer: Self-pay | Admitting: *Deleted

## 2018-07-06 NOTE — Telephone Encounter (Signed)
Pt states that he is having bright flashes of light and floaters in his right eye.  He is not having headaches, pain or any other type of issues.  He states that his last stroke started with vision issues.  Advised that with his history he definitely needed to be seen.    He is on his way to [place] (family christmas) but he will find an urgent care to get seen. Aashish Hamm, [name], CMA

## 2018-07-09 DIAGNOSIS — H43811 Vitreous degeneration, right eye: Secondary | ICD-10-CM | POA: Diagnosis not present

## 2018-07-09 DIAGNOSIS — Z961 Presence of intraocular lens: Secondary | ICD-10-CM | POA: Diagnosis not present

## 2018-07-09 DIAGNOSIS — H26491 Other secondary cataract, right eye: Secondary | ICD-10-CM | POA: Diagnosis not present

## 2018-07-09 DIAGNOSIS — E119 Type 2 diabetes mellitus without complications: Secondary | ICD-10-CM | POA: Diagnosis not present

## 2018-07-11 DIAGNOSIS — G4733 Obstructive sleep apnea (adult) (pediatric): Secondary | ICD-10-CM | POA: Diagnosis not present

## 2018-07-16 ENCOUNTER — Telehealth (INDEPENDENT_AMBULATORY_CARE_PROVIDER_SITE_OTHER): Payer: Self-pay | Admitting: Physician Assistant

## 2018-07-16 NOTE — Telephone Encounter (Signed)
Patient called request call back from Abby Potash to discuss some issues going on. [phone].

## 2018-07-16 NOTE — Telephone Encounter (Signed)
I spoke with this patient, as he had a question about his medication and whether or not he should increase his metformin given his last 3 fasting blood sugar readings. Advised patient to continue same dose for now, continue recording his blood sugars, and that we will discuss further on his next visit.

## 2018-07-18 ENCOUNTER — Ambulatory Visit (INDEPENDENT_AMBULATORY_CARE_PROVIDER_SITE_OTHER): Payer: Medicare HMO | Admitting: Physician Assistant

## 2018-07-26 ENCOUNTER — Other Ambulatory Visit (INDEPENDENT_AMBULATORY_CARE_PROVIDER_SITE_OTHER): Payer: Self-pay | Admitting: Physician Assistant

## 2018-07-26 DIAGNOSIS — G4733 Obstructive sleep apnea (adult) (pediatric): Secondary | ICD-10-CM | POA: Diagnosis not present

## 2018-07-26 DIAGNOSIS — E7849 Other hyperlipidemia: Secondary | ICD-10-CM

## 2018-08-06 ENCOUNTER — Ambulatory Visit (INDEPENDENT_AMBULATORY_CARE_PROVIDER_SITE_OTHER): Payer: Medicare HMO | Admitting: Physician Assistant

## 2018-08-06 ENCOUNTER — Encounter (INDEPENDENT_AMBULATORY_CARE_PROVIDER_SITE_OTHER): Payer: Self-pay

## 2018-08-14 ENCOUNTER — Telehealth: Payer: Self-pay | Admitting: *Deleted

## 2018-08-14 ENCOUNTER — Encounter (INDEPENDENT_AMBULATORY_CARE_PROVIDER_SITE_OTHER): Payer: Self-pay | Admitting: Family Medicine

## 2018-08-14 ENCOUNTER — Ambulatory Visit (INDEPENDENT_AMBULATORY_CARE_PROVIDER_SITE_OTHER): Payer: Medicare HMO | Admitting: Family Medicine

## 2018-08-14 VITALS — BP 127/76 | HR 74 | Temp 97.7°F | Ht 73.0 in | Wt 310.0 lb

## 2018-08-14 DIAGNOSIS — M25572 Pain in left ankle and joints of left foot: Secondary | ICD-10-CM | POA: Diagnosis not present

## 2018-08-14 DIAGNOSIS — E559 Vitamin D deficiency, unspecified: Secondary | ICD-10-CM | POA: Diagnosis not present

## 2018-08-14 DIAGNOSIS — Z6841 Body Mass Index (BMI) 40.0 and over, adult: Secondary | ICD-10-CM

## 2018-08-14 DIAGNOSIS — F3289 Other specified depressive episodes: Secondary | ICD-10-CM | POA: Diagnosis not present

## 2018-08-14 DIAGNOSIS — M79662 Pain in left lower leg: Secondary | ICD-10-CM | POA: Diagnosis not present

## 2018-08-14 MED ORDER — BUPROPION HCL ER (SR) 150 MG PO TB12: 150.0000 mg | ORAL_TABLET | Freq: Every day | ORAL | 0 refills | Status: DC

## 2018-08-14 MED ORDER — VITAMIN D (ERGOCALCIFEROL) 1.25 MG (50000 UNIT) PO CAPS: 50000.0000 [IU] | ORAL_CAPSULE | ORAL | 0 refills | Status: DC

## 2018-08-14 NOTE — Telephone Encounter (Signed)
Pt lmovm that he has fallen the other day and is having some brushing.    Attempted to call pt back but had to leave message, ask that if he is still having pain to please call and scehdule an appt. [name] [name], [name], CMA

## 2018-08-14 NOTE — Telephone Encounter (Signed)
Pt called back and informed us that he went to [facility]. [place], [name], CMA
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
## 2018-08-16 ENCOUNTER — Other Ambulatory Visit: Payer: Self-pay | Admitting: Student in an Organized Health Care Education/Training Program

## 2018-08-16 DIAGNOSIS — F329 Major depressive disorder, single episode, unspecified: Secondary | ICD-10-CM

## 2018-08-16 DIAGNOSIS — F32A Depression, unspecified: Secondary | ICD-10-CM

## 2018-08-26 DIAGNOSIS — G4733 Obstructive sleep apnea (adult) (pediatric): Secondary | ICD-10-CM | POA: Diagnosis not present

## 2018-08-28 ENCOUNTER — Ambulatory Visit (INDEPENDENT_AMBULATORY_CARE_PROVIDER_SITE_OTHER): Payer: Medicare HMO | Admitting: Family Medicine

## 2018-08-28 ENCOUNTER — Encounter (INDEPENDENT_AMBULATORY_CARE_PROVIDER_SITE_OTHER): Payer: Self-pay | Admitting: Family Medicine

## 2018-08-28 VITALS — BP 121/75 | HR 57 | Temp 97.7°F | Ht 73.0 in | Wt 307.0 lb

## 2018-08-28 DIAGNOSIS — E119 Type 2 diabetes mellitus without complications: Secondary | ICD-10-CM

## 2018-08-28 DIAGNOSIS — E7849 Other hyperlipidemia: Secondary | ICD-10-CM

## 2018-08-28 DIAGNOSIS — Z6841 Body Mass Index (BMI) 40.0 and over, adult: Secondary | ICD-10-CM | POA: Diagnosis not present

## 2018-08-28 DIAGNOSIS — I509 Heart failure, unspecified: Secondary | ICD-10-CM | POA: Diagnosis not present

## 2018-08-28 MED ORDER — ROSUVASTATIN CALCIUM 40 MG PO TABS: 40.0000 mg | ORAL_TABLET | Freq: Every day | ORAL | 0 refills | Status: DC

## 2018-08-28 MED ORDER — METFORMIN HCL 1000 MG PO TABS: 1000.0000 mg | ORAL_TABLET | Freq: Every day | ORAL | 0 refills | Status: DC
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
## 2018-08-29 DIAGNOSIS — H43811 Vitreous degeneration, right eye: Secondary | ICD-10-CM | POA: Diagnosis not present

## 2018-08-29 DIAGNOSIS — Z961 Presence of intraocular lens: Secondary | ICD-10-CM | POA: Diagnosis not present

## 2018-08-29 DIAGNOSIS — E119 Type 2 diabetes mellitus without complications: Secondary | ICD-10-CM | POA: Diagnosis not present

## 2018-09-07 ENCOUNTER — Other Ambulatory Visit: Payer: Self-pay | Admitting: Student in an Organized Health Care Education/Training Program

## 2018-09-07 DIAGNOSIS — I10 Essential (primary) hypertension: Secondary | ICD-10-CM

## 2018-09-24 ENCOUNTER — Encounter (INDEPENDENT_AMBULATORY_CARE_PROVIDER_SITE_OTHER): Payer: Self-pay | Admitting: Family Medicine

## 2018-09-24 ENCOUNTER — Ambulatory Visit (INDEPENDENT_AMBULATORY_CARE_PROVIDER_SITE_OTHER): Payer: Medicare HMO | Admitting: Family Medicine

## 2018-09-24 VITALS — BP 119/78 | HR 70 | Temp 98.0°F | Ht 73.0 in | Wt 310.0 lb

## 2018-09-24 DIAGNOSIS — E559 Vitamin D deficiency, unspecified: Secondary | ICD-10-CM

## 2018-09-24 DIAGNOSIS — E119 Type 2 diabetes mellitus without complications: Secondary | ICD-10-CM | POA: Diagnosis not present

## 2018-09-24 DIAGNOSIS — I10 Essential (primary) hypertension: Secondary | ICD-10-CM | POA: Diagnosis not present

## 2018-09-24 DIAGNOSIS — Z6841 Body Mass Index (BMI) 40.0 and over, adult: Secondary | ICD-10-CM | POA: Diagnosis not present

## 2018-09-24 DIAGNOSIS — E782 Mixed hyperlipidemia: Secondary | ICD-10-CM

## 2018-09-24 MED ORDER — VITAMIN D (ERGOCALCIFEROL) 1.25 MG (50000 UNIT) PO CAPS: 50000.0000 [IU] | ORAL_CAPSULE | ORAL | 0 refills | Status: DC
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
## 2018-09-25 LAB — COMPREHENSIVE METABOLIC PANEL
ALT: 19 IU/L (ref 0–44)
AST: 16 IU/L (ref 0–40)
Albumin/Globulin Ratio: 2.2 (ref 1.2–2.2)
Albumin: 4.6 g/dL (ref 3.7–4.7)
Alkaline Phosphatase: 64 IU/L (ref 39–117)
BUN/Creatinine Ratio: 17 (ref 10–24)
BUN: 23 mg/dL (ref 8–27)
Bilirubin Total: 0.4 mg/dL (ref 0.0–1.2)
CALCIUM: 9.5 mg/dL (ref 8.6–10.2)
CO2: 22 mmol/L (ref 20–29)
CREATININE: 1.38 mg/dL — AB (ref 0.76–1.27)
Chloride: 101 mmol/L (ref 96–106)
GFR, EST AFRICAN AMERICAN: 59 mL/min/{1.73_m2} — AB (ref >59)
GFR, EST NON AFRICAN AMERICAN: 51 mL/min/{1.73_m2} — AB (ref >59)
Globulin, Total: 2.1 g/dL (ref 1.5–4.5)
Glucose: 139 mg/dL — ABNORMAL HIGH (ref 65–99)
Potassium: 4.1 mmol/L (ref 3.5–5.2)
Sodium: 141 mmol/L (ref 134–144)
Total Protein: 6.7 g/dL (ref 6.0–8.5)

## 2018-09-25 LAB — VITAMIN D 25 HYDROXY (VIT D DEFICIENCY, FRACTURES): Vit D, 25-Hydroxy: 58.7 ng/mL (ref 30.0–100.0)

## 2018-09-25 LAB — LIPID PANEL WITH LDL/HDL RATIO
CHOLESTEROL TOTAL: 168 mg/dL (ref 100–199)
HDL: 38 mg/dL — ABNORMAL LOW (ref >39)
LDL Calculated: 102 mg/dL — ABNORMAL HIGH (ref 0–99)
LDl/HDL Ratio: 2.7 ratio (ref 0.0–3.6)
Triglycerides: 140 mg/dL (ref 0–149)
VLDL Cholesterol Cal: 28 mg/dL (ref 5–40)

## 2018-09-25 LAB — HEMOGLOBIN A1C
Est. average glucose Bld gHb Est-mCnc: 151 mg/dL
Hgb A1c MFr Bld: 6.9 % — ABNORMAL HIGH (ref 4.8–5.6)

## 2018-09-25 LAB — INSULIN, RANDOM: INSULIN: 19.2 u[IU]/mL (ref 2.6–24.9)

## 2018-09-26 DIAGNOSIS — G4733 Obstructive sleep apnea (adult) (pediatric): Secondary | ICD-10-CM | POA: Diagnosis not present

## 2018-10-02 ENCOUNTER — Other Ambulatory Visit: Payer: Self-pay | Admitting: Student in an Organized Health Care Education/Training Program

## 2018-10-02 ENCOUNTER — Other Ambulatory Visit (INDEPENDENT_AMBULATORY_CARE_PROVIDER_SITE_OTHER): Payer: Self-pay | Admitting: Family Medicine

## 2018-10-02 DIAGNOSIS — E119 Type 2 diabetes mellitus without complications: Secondary | ICD-10-CM

## 2018-10-02 DIAGNOSIS — E7849 Other hyperlipidemia: Secondary | ICD-10-CM

## 2018-10-02 DIAGNOSIS — F329 Major depressive disorder, single episode, unspecified: Secondary | ICD-10-CM

## 2018-10-02 DIAGNOSIS — F32A Depression, unspecified: Secondary | ICD-10-CM

## 2018-10-15 ENCOUNTER — Other Ambulatory Visit: Payer: Self-pay

## 2018-10-15 ENCOUNTER — Ambulatory Visit (INDEPENDENT_AMBULATORY_CARE_PROVIDER_SITE_OTHER): Payer: Medicare HMO | Admitting: Family Medicine

## 2018-10-15 ENCOUNTER — Encounter (INDEPENDENT_AMBULATORY_CARE_PROVIDER_SITE_OTHER): Payer: Self-pay | Admitting: Family Medicine

## 2018-10-15 VITALS — BP 115/74 | HR 65 | Ht 73.0 in | Wt 303.0 lb

## 2018-10-15 DIAGNOSIS — Z6833 Body mass index (BMI) 33.0-33.9, adult: Secondary | ICD-10-CM | POA: Diagnosis not present

## 2018-10-15 DIAGNOSIS — E669 Obesity, unspecified: Secondary | ICD-10-CM

## 2018-10-15 DIAGNOSIS — E559 Vitamin D deficiency, unspecified: Secondary | ICD-10-CM | POA: Diagnosis not present

## 2018-10-15 MED ORDER — VITAMIN D (ERGOCALCIFEROL) 1.25 MG (50000 UNIT) PO CAPS: 50000.0000 [IU] | ORAL_CAPSULE | ORAL | 0 refills | Status: DC
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
## 2018-10-16 NOTE — Addendum Note (Signed)
Addended by: [name] D on: 10/16/2018 02:19 PM   Modules accepted: Level of Service

## 2018-10-18 DIAGNOSIS — D2261 Melanocytic nevi of right upper limb, including shoulder: Secondary | ICD-10-CM | POA: Diagnosis not present

## 2018-10-18 DIAGNOSIS — L814 Other melanin hyperpigmentation: Secondary | ICD-10-CM | POA: Diagnosis not present

## 2018-10-18 DIAGNOSIS — D225 Melanocytic nevi of trunk: Secondary | ICD-10-CM | POA: Diagnosis not present

## 2018-10-18 DIAGNOSIS — L821 Other seborrheic keratosis: Secondary | ICD-10-CM | POA: Diagnosis not present

## 2018-10-18 DIAGNOSIS — L570 Actinic keratosis: Secondary | ICD-10-CM | POA: Diagnosis not present

## 2018-10-18 DIAGNOSIS — D1801 Hemangioma of skin and subcutaneous tissue: Secondary | ICD-10-CM | POA: Diagnosis not present

## 2018-10-25 DIAGNOSIS — G4733 Obstructive sleep apnea (adult) (pediatric): Secondary | ICD-10-CM | POA: Diagnosis not present

## 2018-11-06 ENCOUNTER — Other Ambulatory Visit (INDEPENDENT_AMBULATORY_CARE_PROVIDER_SITE_OTHER): Payer: Self-pay | Admitting: Family Medicine

## 2018-11-06 DIAGNOSIS — E7849 Other hyperlipidemia: Secondary | ICD-10-CM

## 2018-11-09 ENCOUNTER — Other Ambulatory Visit (INDEPENDENT_AMBULATORY_CARE_PROVIDER_SITE_OTHER): Payer: Self-pay | Admitting: Family Medicine

## 2018-11-09 DIAGNOSIS — E119 Type 2 diabetes mellitus without complications: Secondary | ICD-10-CM

## 2018-11-10 ENCOUNTER — Other Ambulatory Visit: Payer: Self-pay | Admitting: Student in an Organized Health Care Education/Training Program

## 2018-11-12 ENCOUNTER — Ambulatory Visit (INDEPENDENT_AMBULATORY_CARE_PROVIDER_SITE_OTHER): Payer: Medicare HMO | Admitting: Family Medicine

## 2018-11-12 ENCOUNTER — Encounter (INDEPENDENT_AMBULATORY_CARE_PROVIDER_SITE_OTHER): Payer: Self-pay | Admitting: Family Medicine

## 2018-11-12 ENCOUNTER — Other Ambulatory Visit: Payer: Self-pay

## 2018-11-12 DIAGNOSIS — E119 Type 2 diabetes mellitus without complications: Secondary | ICD-10-CM | POA: Diagnosis not present

## 2018-11-12 DIAGNOSIS — Z6839 Body mass index (BMI) 39.0-39.9, adult: Secondary | ICD-10-CM

## 2018-11-12 DIAGNOSIS — F3289 Other specified depressive episodes: Secondary | ICD-10-CM | POA: Diagnosis not present

## 2018-11-12 DIAGNOSIS — E559 Vitamin D deficiency, unspecified: Secondary | ICD-10-CM | POA: Diagnosis not present

## 2018-11-12 MED ORDER — VITAMIN D (ERGOCALCIFEROL) 1.25 MG (50000 UNIT) PO CAPS: 50000.0000 [IU] | ORAL_CAPSULE | ORAL | 0 refills | Status: DC

## 2018-11-12 MED ORDER — BUPROPION HCL ER (SR) 150 MG PO TB12: 150.0000 mg | ORAL_TABLET | Freq: Every day | ORAL | 0 refills | Status: DC

## 2018-11-12 MED ORDER — METFORMIN HCL 1000 MG PO TABS: 1000.0000 mg | ORAL_TABLET | Freq: Every day | ORAL | 0 refills | Status: DC
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
## 2018-11-13 NOTE — Telephone Encounter (Signed)
Refilled by another provider.

## 2018-11-29 ENCOUNTER — Ambulatory Visit (INDEPENDENT_AMBULATORY_CARE_PROVIDER_SITE_OTHER): Payer: Medicare HMO | Admitting: Family Medicine

## 2018-11-29 ENCOUNTER — Encounter (INDEPENDENT_AMBULATORY_CARE_PROVIDER_SITE_OTHER): Payer: Self-pay | Admitting: Family Medicine

## 2018-11-29 ENCOUNTER — Other Ambulatory Visit: Payer: Self-pay

## 2018-11-29 DIAGNOSIS — Z6839 Body mass index (BMI) 39.0-39.9, adult: Secondary | ICD-10-CM | POA: Diagnosis not present

## 2018-11-29 DIAGNOSIS — F3289 Other specified depressive episodes: Secondary | ICD-10-CM

## 2018-11-29 DIAGNOSIS — E7849 Other hyperlipidemia: Secondary | ICD-10-CM

## 2018-11-29 DIAGNOSIS — E119 Type 2 diabetes mellitus without complications: Secondary | ICD-10-CM | POA: Diagnosis not present

## 2018-11-29 DIAGNOSIS — E559 Vitamin D deficiency, unspecified: Secondary | ICD-10-CM

## 2018-11-29 MED ORDER — ROSUVASTATIN CALCIUM 40 MG PO TABS: ORAL_TABLET | ORAL | 0 refills | Status: DC

## 2018-11-29 MED ORDER — METFORMIN HCL 1000 MG PO TABS: 1000.0000 mg | ORAL_TABLET | Freq: Every day | ORAL | 0 refills | Status: DC

## 2018-11-29 MED ORDER — VITAMIN D (ERGOCALCIFEROL) 1.25 MG (50000 UNIT) PO CAPS: 50000.0000 [IU] | ORAL_CAPSULE | ORAL | 0 refills | Status: DC

## 2018-11-29 MED ORDER — BUPROPION HCL ER (SR) 150 MG PO TB12: 150.0000 mg | ORAL_TABLET | Freq: Every day | ORAL | 0 refills | Status: DC
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
## 2018-12-03 ENCOUNTER — Other Ambulatory Visit: Payer: Self-pay | Admitting: Student in an Organized Health Care Education/Training Program

## 2018-12-03 DIAGNOSIS — I10 Essential (primary) hypertension: Secondary | ICD-10-CM

## 2018-12-13 ENCOUNTER — Other Ambulatory Visit: Payer: Self-pay

## 2018-12-13 ENCOUNTER — Ambulatory Visit (INDEPENDENT_AMBULATORY_CARE_PROVIDER_SITE_OTHER): Payer: Medicare HMO | Admitting: Family Medicine

## 2018-12-13 ENCOUNTER — Encounter (INDEPENDENT_AMBULATORY_CARE_PROVIDER_SITE_OTHER): Payer: Self-pay | Admitting: Family Medicine

## 2018-12-13 DIAGNOSIS — E119 Type 2 diabetes mellitus without complications: Secondary | ICD-10-CM | POA: Diagnosis not present

## 2018-12-13 DIAGNOSIS — Z6839 Body mass index (BMI) 39.0-39.9, adult: Secondary | ICD-10-CM

## 2018-12-17 ENCOUNTER — Ambulatory Visit (INDEPENDENT_AMBULATORY_CARE_PROVIDER_SITE_OTHER): Payer: Medicare HMO | Admitting: Student in an Organized Health Care Education/Training Program

## 2018-12-17 ENCOUNTER — Other Ambulatory Visit: Payer: Self-pay

## 2018-12-17 ENCOUNTER — Encounter: Payer: Self-pay | Admitting: Student in an Organized Health Care Education/Training Program

## 2018-12-17 DIAGNOSIS — E7849 Other hyperlipidemia: Secondary | ICD-10-CM

## 2018-12-17 DIAGNOSIS — F32A Depression, unspecified: Secondary | ICD-10-CM

## 2018-12-17 DIAGNOSIS — R001 Bradycardia, unspecified: Secondary | ICD-10-CM | POA: Diagnosis not present

## 2018-12-17 DIAGNOSIS — E119 Type 2 diabetes mellitus without complications: Secondary | ICD-10-CM | POA: Diagnosis not present

## 2018-12-17 DIAGNOSIS — I10 Essential (primary) hypertension: Secondary | ICD-10-CM

## 2018-12-17 DIAGNOSIS — F3289 Other specified depressive episodes: Secondary | ICD-10-CM | POA: Diagnosis not present

## 2018-12-17 DIAGNOSIS — F329 Major depressive disorder, single episode, unspecified: Secondary | ICD-10-CM | POA: Diagnosis not present

## 2018-12-17 LAB — POCT GLYCOSYLATED HEMOGLOBIN (HGB A1C): HbA1c, POC (controlled diabetic range): 6.2 % (ref 0.0–7.0)

## 2018-12-17 MED ORDER — SERTRALINE HCL 50 MG PO TABS: 50.0000 mg | ORAL_TABLET | Freq: Every day | ORAL | 3 refills | Status: DC

## 2018-12-17 MED ORDER — ROSUVASTATIN CALCIUM 40 MG PO TABS: ORAL_TABLET | ORAL | 0 refills | Status: DC

## 2018-12-17 MED ORDER — FUROSEMIDE 40 MG PO TABS: 40.0000 mg | ORAL_TABLET | Freq: Every day | ORAL | 1 refills | Status: DC

## 2018-12-17 MED ORDER — METFORMIN HCL 1000 MG PO TABS: 1000.0000 mg | ORAL_TABLET | Freq: Every day | ORAL | 0 refills | Status: DC

## 2018-12-17 MED ORDER — BUPROPION HCL ER (SR) 150 MG PO TB12: 150.0000 mg | ORAL_TABLET | Freq: Every day | ORAL | 0 refills | Status: DC

## 2018-12-17 MED ORDER — LOSARTAN POTASSIUM-HCTZ 50-12.5 MG PO TABS: ORAL_TABLET | ORAL | 3 refills | Status: DC

## 2018-12-17 MED ORDER — GLUCOSAMINE HCL 1500 MG PO TABS: 1500.0000 mg | ORAL_TABLET | Freq: Every day | ORAL | 0 refills | Status: DC

## 2018-12-17 NOTE — Assessment & Plan Note (Signed)
Continue current management

## 2018-12-17 NOTE — Progress Notes (Signed)
CC: Medication Reconciliation and refills  HPI: [name] [name] is a very pleasant 73 y.o. male with PMH significant for CHF, HTN, CVA, hx squamous cell carcinoma of skin, hx cervical neck pain, hx carotid artery occlusion.  Med refills Patient brings in his medication pill bottles which are nearing empty. He asks for 90 day refills. Additionally his  HCTZ-losartan was previously split to two pills by his pharmacy and he asks that this is sent to his pharmacy as a combo pill.  Fatigue Patient reports that he has tried to stay active despite being stuck at home due to the [place] pandemic. 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 He has been doing church services in [employer] parking lot with all of the church member sin their cars. He reports that he tries to do yard work daily but feels more fatigued than normal after he has worked for a few hours. No chest pain or dyspnea. No LE edema. He continues to follow with healthy weight and wellness and reports 60 lbs weight loss. His goal is to lose 100 lbs total and he has recently cut out carbohydrates to work toward this goal. He has a history of depression on zoloft and wellbutrin but does not feel that this is contributing to his current symptoms.  T2DM A1c 6.2 from 6.9. No new polydipsia or polyuria. No CP. Currently takes metformin once daily with no difficulties. Continues to work on diet and weight management as noted above.  Health maintenance: patient will be due for an eye exam once clinics reopen.  Review of Symptoms:  See HPI for ROS.   CC, SH/smoking status, and VS noted.  Objective: BP 126/84   Pulse (!) 52   SpO2 98%  GEN: NAD, alert, cooperative, and pleasant. RESPIRATORY: Comfortable work of breathing, speaks in full sentences CV: Regular rate noted, distal extremities well perfused and warm without edema GI: Soft, nondistended SKIN: warm and dry, no rashes or lesions NEURO: II-XII grossly intact MSK:  Moves 4 extremities equally PSYCH: AAOx3, appropriate affect  Assessment and plan:  Bradycardia Patient is on Toprol 12.5 mg QD with a HR of 52 in the office today. No light headedness or dizziness, no reported falls. Bradycardia may contribute to his vague symptoms of fatigue. He does not have hx of afib or ACS per chart review. - trial off of metoprolol - patient to call on 5/22 with manually checked HR (to save him a nurse visit) - red flags/reasons to call sooner including angina or palpitations were discussed, patient is agreeable.  Type 2 diabetes mellitus without complication, without long-term current use of insulin ([place]) Continue current management.   Orders Placed This Encounter  Procedures  . POCT glycosylated hemoglobin (Hb A1C)    Meds ordered this encounter  Medications  . sertraline (ZOLOFT) 50 MG tablet    Sig: Take 1 tablet (50 mg total) by mouth daily.    Dispense:  90 tablet    Refill:  3    CAN PATIENT GET 90 DAY? THANK YOU  . rosuvastatin (CRESTOR) 40 MG tablet    Sig: TAKE (1) TABLET BY MOUTH ONCE DAILY.    Dispense:  90 tablet    Refill:  0  . losartan-hydrochlorothiazide (HYZAAR) 50-12.5 MG tablet    Sig: TAKE (1) TABLET BY MOUTH ONCE DAILY.    Dispense:  90 tablet    Refill:  3  . Glucosamine HCl 1500 MG TABS    Sig: Take 1  tablet (1,500 mg total) by mouth daily.    Dispense:  90 each    Refill:  0  . furosemide (LASIX) 40 MG tablet    Sig: Take 1 tablet (40 mg total) by mouth daily.    Dispense:  90 tablet    Refill:  1  . buPROPion (WELLBUTRIN SR) 150 MG 12 hr tablet    Sig: Take 1 tablet (150 mg total) by mouth daily.    Dispense:  90 tablet    Refill:  0  . metFORMIN (GLUCOPHAGE) 1000 MG tablet    Sig: Take 1 tablet (1,000 mg total) by mouth at bedtime.    Dispense:  90 tablet    Refill:  0    CAN PT GET 90 DAY SUPPLY, THANK YOU     Everrett Coombe, MD,MS,  PGY3 12/17/2018 3:07 PM

## 2018-12-17 NOTE — Assessment & Plan Note (Signed)
Patient is on Toprol 12.5 mg QD with a HR of 52 in the office today. No light headedness or dizziness, no reported falls. Bradycardia may contribute to his vague symptoms of fatigue. He does not have hx of afib or ACS per chart review. - trial off of metoprolol - patient to call on 5/22 with manually checked HR (to save him a nurse visit) - red flags/reasons to call sooner including angina or palpitations were discussed, patient is agreeable.

## 2018-12-17 NOTE — Patient Instructions (Signed)
It was a pleasure seeing you today in our clinic. Here is the treatment plan we have discussed and agreed upon together:  Please STOP taking your metoprolol.  Check your pulse on Friday by counting the number of heart beats in one minute. Give [name] a call with that number on 5/15.  Side effects of stopping this medication can include chest pain. If that happens, give [name] a call.  We drew blood work at today's visit. I will call with these results. If you do not hear from me within the next week, please give our office a call.  Our clinic's number is [phone]. Please call with questions or concerns about what we discussed today.  Be well, Dr. Burr Medico
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
## 2018-12-21 ENCOUNTER — Telehealth: Payer: Self-pay | Admitting: *Deleted

## 2018-12-21 NOTE — Telephone Encounter (Signed)
Called and informed patient that these pulses are within normal limits. He reports that he feels less drained without the beta blocker. Continue to hold metoprolol. Patient advised to call back if his pulse is >100 or if he feels any palpitations.

## 2018-12-21 NOTE — Telephone Encounter (Signed)
Pt calls to report his pulses since Monday: 21,97,58,83. Christen Bame, CMA

## 2019-01-07 ENCOUNTER — Other Ambulatory Visit: Payer: Self-pay

## 2019-01-07 ENCOUNTER — Encounter (INDEPENDENT_AMBULATORY_CARE_PROVIDER_SITE_OTHER): Payer: Self-pay | Admitting: Family Medicine

## 2019-01-07 ENCOUNTER — Ambulatory Visit (INDEPENDENT_AMBULATORY_CARE_PROVIDER_SITE_OTHER): Payer: Medicare HMO | Admitting: Family Medicine

## 2019-01-07 DIAGNOSIS — E119 Type 2 diabetes mellitus without complications: Secondary | ICD-10-CM

## 2019-01-07 DIAGNOSIS — I10 Essential (primary) hypertension: Secondary | ICD-10-CM | POA: Diagnosis not present

## 2019-01-07 DIAGNOSIS — Z6839 Body mass index (BMI) 39.0-39.9, adult: Secondary | ICD-10-CM | POA: Diagnosis not present
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
## 2019-01-14 ENCOUNTER — Telehealth: Payer: Self-pay | Admitting: Neurology

## 2019-01-14 NOTE — Telephone Encounter (Signed)
Due to current COVID 19 pandemic, our office is severely reducing in office visits until further notice, in order to minimize the risk to our patients and healthcare providers.   Called patient regarding his 6/22 appt to offer a virtual visit via [place]. Patient accepted and rescheduled to 6/23 at 1pm due to Dr. Brett Fairy template change. I have sent patient visit information via MyChart.  Pt understands that although there may be some limitations with this type of visit, we will take all precautions to reduce any security or privacy concerns.  Pt understands that this will be treated like an in office visit and we will file with pt's insurance, and there may be a patient responsible charge related to this service.

## 2019-01-16 NOTE — Telephone Encounter (Signed)
Called the patient to update his chart and discuss his CPAP compliance.for the upcoming VV. 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 He has asked I call back tomorrow morning. Advised I would call in the morning.

## 2019-01-17 ENCOUNTER — Encounter: Payer: Self-pay | Admitting: Neurology

## 2019-01-17 NOTE — Addendum Note (Signed)
Addended by: Darleen Crocker on: 01/17/2019 08:12 AM   Modules accepted: Orders

## 2019-01-17 NOTE — Telephone Encounter (Signed)
Called and reviewed the patient's chart and it is up to date. In looking at the patient's download noticed the patient has not been using the machine. Advised the patient that insurance will not cover supplies for him if not using the machine. Advised that we push the apt out 60 days to allow the patient to become compliant. Pt verbalized understanding.

## 2019-01-21 ENCOUNTER — Ambulatory Visit: Payer: Medicare HMO | Admitting: Neurology

## 2019-01-22 ENCOUNTER — Telehealth: Payer: Medicare HMO | Admitting: Neurology

## 2019-01-24 ENCOUNTER — Encounter (INDEPENDENT_AMBULATORY_CARE_PROVIDER_SITE_OTHER): Payer: Self-pay | Admitting: Family Medicine

## 2019-01-24 ENCOUNTER — Telehealth (INDEPENDENT_AMBULATORY_CARE_PROVIDER_SITE_OTHER): Payer: Medicare HMO | Admitting: Family Medicine

## 2019-01-24 ENCOUNTER — Other Ambulatory Visit: Payer: Self-pay

## 2019-01-24 DIAGNOSIS — Z6839 Body mass index (BMI) 39.0-39.9, adult: Secondary | ICD-10-CM

## 2019-01-24 DIAGNOSIS — E559 Vitamin D deficiency, unspecified: Secondary | ICD-10-CM

## 2019-01-24 DIAGNOSIS — E119 Type 2 diabetes mellitus without complications: Secondary | ICD-10-CM | POA: Diagnosis not present

## 2019-01-24 MED ORDER — VITAMIN D (ERGOCALCIFEROL) 1.25 MG (50000 UNIT) PO CAPS: 50000.0000 [IU] | ORAL_CAPSULE | ORAL | 0 refills | Status: DC
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
## 2019-02-13 ENCOUNTER — Other Ambulatory Visit: Payer: Self-pay

## 2019-02-13 ENCOUNTER — Telehealth (INDEPENDENT_AMBULATORY_CARE_PROVIDER_SITE_OTHER): Payer: Medicare HMO | Admitting: Family Medicine

## 2019-02-13 ENCOUNTER — Encounter (INDEPENDENT_AMBULATORY_CARE_PROVIDER_SITE_OTHER): Payer: Self-pay | Admitting: Family Medicine

## 2019-02-13 DIAGNOSIS — E559 Vitamin D deficiency, unspecified: Secondary | ICD-10-CM

## 2019-02-13 DIAGNOSIS — Z711 Person with feared health complaint in whom no diagnosis is made: Secondary | ICD-10-CM

## 2019-02-13 DIAGNOSIS — Z6839 Body mass index (BMI) 39.0-39.9, adult: Secondary | ICD-10-CM | POA: Diagnosis not present

## 2019-02-13 MED ORDER — VITAMIN D (ERGOCALCIFEROL) 1.25 MG (50000 UNIT) PO CAPS: 50000.0000 [IU] | ORAL_CAPSULE | ORAL | 0 refills | Status: DC

## 2019-02-14 ENCOUNTER — Encounter (INDEPENDENT_AMBULATORY_CARE_PROVIDER_SITE_OTHER): Payer: Self-pay | Admitting: Family Medicine

## 2019-02-14 ENCOUNTER — Encounter (INDEPENDENT_AMBULATORY_CARE_PROVIDER_SITE_OTHER): Payer: Self-pay
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
## 2019-03-06 ENCOUNTER — Telehealth (INDEPENDENT_AMBULATORY_CARE_PROVIDER_SITE_OTHER): Payer: Medicare HMO | Admitting: Family Medicine

## 2019-03-06 ENCOUNTER — Other Ambulatory Visit: Payer: Self-pay

## 2019-03-06 ENCOUNTER — Encounter (INDEPENDENT_AMBULATORY_CARE_PROVIDER_SITE_OTHER): Payer: Self-pay | Admitting: Family Medicine

## 2019-03-06 DIAGNOSIS — Z6839 Body mass index (BMI) 39.0-39.9, adult: Secondary | ICD-10-CM | POA: Diagnosis not present

## 2019-03-06 DIAGNOSIS — E559 Vitamin D deficiency, unspecified: Secondary | ICD-10-CM

## 2019-03-06 DIAGNOSIS — E119 Type 2 diabetes mellitus without complications: Secondary | ICD-10-CM

## 2019-03-06 MED ORDER — VITAMIN D (ERGOCALCIFEROL) 1.25 MG (50000 UNIT) PO CAPS: 50000.0000 [IU] | ORAL_CAPSULE | ORAL | 0 refills | Status: DC
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
## 2019-03-07 NOTE — Telephone Encounter (Signed)
Please advise 

## 2019-03-11 NOTE — Telephone Encounter (Signed)
fyi

## 2019-03-12 ENCOUNTER — Encounter (INDEPENDENT_AMBULATORY_CARE_PROVIDER_SITE_OTHER): Payer: Self-pay | Admitting: Family Medicine

## 2019-03-21 ENCOUNTER — Other Ambulatory Visit: Payer: Self-pay

## 2019-03-21 ENCOUNTER — Ambulatory Visit (INDEPENDENT_AMBULATORY_CARE_PROVIDER_SITE_OTHER): Payer: Medicare HMO | Admitting: Family Medicine

## 2019-03-21 ENCOUNTER — Encounter: Payer: Self-pay | Admitting: Family Medicine

## 2019-03-21 VITALS — BP 115/72 | HR 62 | Temp 97.1°F | Ht 73.0 in | Wt 301.4 lb

## 2019-03-21 DIAGNOSIS — G4733 Obstructive sleep apnea (adult) (pediatric): Secondary | ICD-10-CM | POA: Diagnosis not present

## 2019-03-21 DIAGNOSIS — Z9989 Dependence on other enabling machines and devices: Secondary | ICD-10-CM | POA: Diagnosis not present
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
## 2019-03-21 NOTE — Patient Instructions (Signed)
Continue CPAP nightly and for greater than 4 hours each night  Follow up in 6 months  Sleep Apnea Sleep apnea affects breathing during sleep. It causes breathing to stop for a short time or to become shallow. It can also increase the risk of:  Heart attack.  Stroke.  Being very overweight (obese).  Diabetes.  Heart failure.  Irregular heartbeat. The goal of treatment is to help you breathe normally again. What are the causes? There are three kinds of sleep apnea:  Obstructive sleep apnea. This is caused by a blocked or collapsed airway.  Central sleep apnea. This happens when the brain does not send the right signals to the muscles that control breathing.  Mixed sleep apnea. This is a combination of obstructive and central sleep apnea. The most common cause of this condition is a collapsed or blocked airway. This can happen if:  Your throat muscles are too relaxed.  Your tongue and tonsils are too large.  You are overweight.  Your airway is too small. What increases the risk?  Being overweight.  Smoking.  Having a small airway.  Being older.  Being male.  Drinking alcohol.  Taking medicines to calm yourself (sedatives or tranquilizers).  Having family members with the condition. What are the signs or symptoms?  Trouble staying asleep.  Being sleepy or tired during the day.  Getting angry a lot.  Loud snoring.  Headaches in the morning.  Not being able to focus your mind (concentrate).  Forgetting things.  Less interest in sex.  Mood swings.  Personality changes.  Feelings of sadness (depression).  Waking up a lot during the night to pee (urinate).  Dry mouth.  Sore throat. How is this diagnosed?  Your medical history.  A physical exam.  A test that is done when you are sleeping (sleep study). The test is most often done in a sleep lab but may also be done at home. How is this treated?   Sleeping on your side.  Using a  medicine to get rid of mucus in your nose (decongestant).  Avoiding the use of alcohol, medicines to help you relax, or certain pain medicines (narcotics).  Losing weight, if needed.  Changing your diet.  Not smoking.  Using a machine to open your airway while you sleep, such as: ? An oral appliance. This is a mouthpiece that shifts your lower jaw forward. ? A CPAP device. This device blows air through a mask when you breathe out (exhale). ? An EPAP device. This has valves that you put in each nostril. ? A BPAP device. This device blows air through a mask when you breathe in (inhale) and breathe out.  Having surgery if other treatments do not work. It is important to get treatment for sleep apnea. Without treatment, it can lead to:  High blood pressure.  Coronary artery disease.  In men, not being able to have an erection (impotence).  Reduced thinking ability. Follow these instructions at home: Lifestyle  Make changes that your doctor recommends.  Eat a healthy diet.  Lose weight if needed.  Avoid alcohol, medicines to help you relax, and some pain medicines.  Do not use any products that contain nicotine or tobacco, such as cigarettes, e-cigarettes, and chewing tobacco. If you need help quitting, ask your doctor. General instructions  Take over-the-counter and prescription medicines only as told by your doctor.  If you were given a machine to use while you sleep, use it only as told by your  doctor.  If you are having surgery, make sure to tell your doctor you have sleep apnea. You may need to bring your device with you.  Keep all follow-up visits as told by your doctor. This is important. Contact a doctor if:  The machine that you were given to use during sleep bothers you or does not seem to be working.  You do not get better.  You get worse. Get help right away if:  Your chest hurts.  You have trouble breathing in enough air.  You have an uncomfortable  feeling in your back, arms, or stomach.  You have trouble talking.  One side of your body feels weak.  A part of your face is hanging down. These symptoms may be an emergency. Do not wait to see if the symptoms will go away. Get medical help right away. Call your local emergency services (911 in the [place] Do not drive yourself to the hospital. Summary  This condition affects breathing during sleep.  The most common cause is a collapsed or blocked airway.  The goal of treatment is to help you breathe normally while you sleep. This information is not intended to replace advice given to you by your health care provider. Make sure you discuss any questions you have with your health care provider. Document Released: 04/26/2008 Document Revised: 05/04/2018 Document Reviewed: 03/13/2018 Elsevier Patient Education  2020 [employer].

## 2019-03-25 ENCOUNTER — Encounter (INDEPENDENT_AMBULATORY_CARE_PROVIDER_SITE_OTHER): Payer: Self-pay | Admitting: Family Medicine

## 2019-03-25 ENCOUNTER — Other Ambulatory Visit: Payer: Self-pay

## 2019-03-25 ENCOUNTER — Ambulatory Visit (INDEPENDENT_AMBULATORY_CARE_PROVIDER_SITE_OTHER): Payer: Medicare HMO | Admitting: Family Medicine

## 2019-03-25 VITALS — BP 119/74 | HR 65 | Temp 97.8°F | Ht 73.0 in | Wt 291.0 lb

## 2019-03-25 DIAGNOSIS — E559 Vitamin D deficiency, unspecified: Secondary | ICD-10-CM | POA: Diagnosis not present

## 2019-03-25 DIAGNOSIS — E538 Deficiency of other specified B group vitamins: Secondary | ICD-10-CM | POA: Diagnosis not present

## 2019-03-25 DIAGNOSIS — E119 Type 2 diabetes mellitus without complications: Secondary | ICD-10-CM

## 2019-03-25 DIAGNOSIS — R5383 Other fatigue: Secondary | ICD-10-CM | POA: Diagnosis not present

## 2019-03-25 DIAGNOSIS — Z6838 Body mass index (BMI) 38.0-38.9, adult: Secondary | ICD-10-CM | POA: Diagnosis not present

## 2019-03-25 MED ORDER — VITAMIN D (ERGOCALCIFEROL) 1.25 MG (50000 UNIT) PO CAPS: 50000.0000 [IU] | ORAL_CAPSULE | ORAL | 0 refills | Status: DC
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
## 2019-03-26 LAB — VITAMIN B12: Vitamin B-12: 782 pg/mL (ref 232–1245)

## 2019-03-26 LAB — COMPREHENSIVE METABOLIC PANEL
ALT: 23 IU/L (ref 0–44)
AST: 16 IU/L (ref 0–40)
Albumin/Globulin Ratio: 2.2 (ref 1.2–2.2)
Albumin: 4.7 g/dL (ref 3.7–4.7)
Alkaline Phosphatase: 56 IU/L (ref 39–117)
BUN/Creatinine Ratio: 18 (ref 10–24)
BUN: 21 mg/dL (ref 8–27)
Bilirubin Total: 0.5 mg/dL (ref 0.0–1.2)
CO2: 24 mmol/L (ref 20–29)
Calcium: 9.3 mg/dL (ref 8.6–10.2)
Chloride: 102 mmol/L (ref 96–106)
Creatinine, Ser: 1.15 mg/dL (ref 0.76–1.27)
GFR calc Af Amer: 73 mL/min/{1.73_m2} (ref >59)
GFR calc non Af Amer: 63 mL/min/{1.73_m2} (ref >59)
Globulin, Total: 2.1 g/dL (ref 1.5–4.5)
Glucose: 122 mg/dL — ABNORMAL HIGH (ref 65–99)
Potassium: 4 mmol/L (ref 3.5–5.2)
Sodium: 144 mmol/L (ref 134–144)
Total Protein: 6.8 g/dL (ref 6.0–8.5)

## 2019-03-26 LAB — T3: T3, Total: 85 ng/dL (ref 71–180)

## 2019-03-26 LAB — LIPID PANEL WITH LDL/HDL RATIO
Cholesterol, Total: 174 mg/dL (ref 100–199)
HDL: 43 mg/dL (ref >39)
LDL Calculated: 114 mg/dL — ABNORMAL HIGH (ref 0–99)
LDl/HDL Ratio: 2.7 ratio (ref 0.0–3.6)
Triglycerides: 87 mg/dL (ref 0–149)
VLDL Cholesterol Cal: 17 mg/dL (ref 5–40)

## 2019-03-26 LAB — INSULIN, RANDOM: INSULIN: 12.4 u[IU]/mL (ref 2.6–24.9)

## 2019-03-26 LAB — HEMOGLOBIN A1C
Est. average glucose Bld gHb Est-mCnc: 126 mg/dL
Hgb A1c MFr Bld: 6 % — ABNORMAL HIGH (ref 4.8–5.6)

## 2019-03-26 LAB — T4, FREE: Free T4: 1.32 ng/dL (ref 0.82–1.77)

## 2019-03-26 LAB — TSH: TSH: 1.5 u[IU]/mL (ref 0.450–4.500)

## 2019-03-26 LAB — VITAMIN D 25 HYDROXY (VIT D DEFICIENCY, FRACTURES): Vit D, 25-Hydroxy: 67.1 ng/mL (ref 30.0–100.0)

## 2019-03-27 ENCOUNTER — Telehealth (INDEPENDENT_AMBULATORY_CARE_PROVIDER_SITE_OTHER): Payer: Medicare HMO | Admitting: Family Medicine

## 2019-04-08 ENCOUNTER — Other Ambulatory Visit: Payer: Self-pay | Admitting: Student in an Organized Health Care Education/Training Program

## 2019-04-08 DIAGNOSIS — E7849 Other hyperlipidemia: Secondary | ICD-10-CM

## 2019-04-08 DIAGNOSIS — E119 Type 2 diabetes mellitus without complications: Secondary | ICD-10-CM

## 2019-04-12 ENCOUNTER — Other Ambulatory Visit: Payer: Self-pay

## 2019-04-12 ENCOUNTER — Ambulatory Visit (INDEPENDENT_AMBULATORY_CARE_PROVIDER_SITE_OTHER): Payer: Medicare HMO

## 2019-04-12 DIAGNOSIS — Z23 Encounter for immunization: Secondary | ICD-10-CM | POA: Diagnosis not present

## 2019-04-15 ENCOUNTER — Ambulatory Visit (INDEPENDENT_AMBULATORY_CARE_PROVIDER_SITE_OTHER): Payer: Medicare HMO | Admitting: Family Medicine

## 2019-04-22 ENCOUNTER — Other Ambulatory Visit (INDEPENDENT_AMBULATORY_CARE_PROVIDER_SITE_OTHER): Payer: Self-pay

## 2019-04-22 ENCOUNTER — Encounter (INDEPENDENT_AMBULATORY_CARE_PROVIDER_SITE_OTHER): Payer: Self-pay | Admitting: Family Medicine

## 2019-04-22 DIAGNOSIS — E559 Vitamin D deficiency, unspecified: Secondary | ICD-10-CM

## 2019-04-22 MED ORDER — VITAMIN D (ERGOCALCIFEROL) 1.25 MG (50000 UNIT) PO CAPS: 50000.0000 [IU] | ORAL_CAPSULE | ORAL | 0 refills | Status: DC

## 2019-04-24 ENCOUNTER — Telehealth (INDEPENDENT_AMBULATORY_CARE_PROVIDER_SITE_OTHER): Payer: Medicare HMO | Admitting: Family Medicine

## 2019-04-24 ENCOUNTER — Telehealth: Payer: Self-pay | Admitting: Family Medicine

## 2019-04-24 DIAGNOSIS — B070 Plantar wart: Secondary | ICD-10-CM

## 2019-04-24 NOTE — Addendum Note (Signed)
Addended by: Concepcion Living on: 04/24/2019 01:15 PM   Modules accepted: Orders

## 2019-04-24 NOTE — Telephone Encounter (Signed)
Pt would like a referral to [facility]. He has a plantar wart on the ball of his right foot. [facility] has him schedule for 05/08/19.

## 2019-04-26 ENCOUNTER — Other Ambulatory Visit: Payer: Self-pay | Admitting: Student in an Organized Health Care Education/Training Program

## 2019-05-02 ENCOUNTER — Ambulatory Visit (INDEPENDENT_AMBULATORY_CARE_PROVIDER_SITE_OTHER): Payer: Medicare HMO | Admitting: Family Medicine

## 2019-05-06 ENCOUNTER — Ambulatory Visit (INDEPENDENT_AMBULATORY_CARE_PROVIDER_SITE_OTHER): Payer: Medicare HMO | Admitting: Family Medicine

## 2019-05-08 ENCOUNTER — Other Ambulatory Visit: Payer: Self-pay

## 2019-05-08 ENCOUNTER — Ambulatory Visit: Payer: Medicare HMO | Admitting: Podiatry

## 2019-05-08 ENCOUNTER — Encounter: Payer: Self-pay | Admitting: Podiatry

## 2019-05-08 DIAGNOSIS — M216X9 Other acquired deformities of unspecified foot: Secondary | ICD-10-CM | POA: Diagnosis not present

## 2019-05-08 DIAGNOSIS — L84 Corns and callosities: Secondary | ICD-10-CM

## 2019-05-08 DIAGNOSIS — M779 Enthesopathy, unspecified: Secondary | ICD-10-CM

## 2019-05-12 NOTE — Progress Notes (Signed)
Subjective:   Patient ID: [name] [name], male   DOB: 73 y.o.   MRN: [id number]   HPI Patient presents with chronic lesion plantar aspect right foot that is been sore and makes it hard to walk.  States is been going on for a long time and is making it increasingly difficult to be active and patient does not smoke likes to be active   Review of Systems  All other systems reviewed and are negative.       Objective:  Physical Exam Vitals signs and nursing note reviewed.  Constitutional:      Appearance: He is well-developed.  Pulmonary:     Effort: Pulmonary effort is normal.  Musculoskeletal: Normal range of motion.  Skin:    General: Skin is warm.  Neurological:     Mental Status: He is alert.     Neurovascular status was found to be intact muscle strength was adequate range of motion within normal limits with exquisite discomfort plantar aspect first metatarsal head right with inflammation fluid noted around the MPJ with keratotic lesion formation that is painful when palpated.  Patient has good digital perfusion well oriented x3     Assessment:  Inflammatory capsulitis first MPJ right with fluid buildup and lesion formation     Plan:  H&P condition reviewed and today I went ahead did sterile prep and injected the capsule plantar 3 mg Dexasone Kenalog 5 mg Xylocaine sterile debridement of lesion accomplished with no iatrogenic bleeding and resume normal activity and may ultimately require elevating osteotomy or other treatment  X-ray was negative for signs of fracture with mild increase in density around the tibial sesamoid

## 2019-05-13 ENCOUNTER — Ambulatory Visit (INDEPENDENT_AMBULATORY_CARE_PROVIDER_SITE_OTHER): Payer: Medicare HMO | Admitting: Family Medicine

## 2019-05-13 ENCOUNTER — Other Ambulatory Visit: Payer: Self-pay

## 2019-05-13 VITALS — BP 103/68 | HR 64 | Temp 98.1°F | Ht 73.0 in | Wt 290.0 lb

## 2019-05-13 DIAGNOSIS — Z6838 Body mass index (BMI) 38.0-38.9, adult: Secondary | ICD-10-CM | POA: Diagnosis not present

## 2019-05-13 DIAGNOSIS — E559 Vitamin D deficiency, unspecified: Secondary | ICD-10-CM

## 2019-05-13 DIAGNOSIS — E119 Type 2 diabetes mellitus without complications: Secondary | ICD-10-CM | POA: Diagnosis not present

## 2019-05-13 MED ORDER — VITAMIN D (ERGOCALCIFEROL) 1.25 MG (50000 UNIT) PO CAPS: 50000.0000 [IU] | ORAL_CAPSULE | ORAL | 0 refills | Status: DC
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
## 2019-05-16 ENCOUNTER — Encounter (INDEPENDENT_AMBULATORY_CARE_PROVIDER_SITE_OTHER): Payer: Self-pay | Admitting: Family Medicine

## 2019-06-03 ENCOUNTER — Other Ambulatory Visit: Payer: Self-pay

## 2019-06-03 ENCOUNTER — Encounter (INDEPENDENT_AMBULATORY_CARE_PROVIDER_SITE_OTHER): Payer: Self-pay | Admitting: Family Medicine

## 2019-06-03 ENCOUNTER — Ambulatory Visit (INDEPENDENT_AMBULATORY_CARE_PROVIDER_SITE_OTHER): Payer: Medicare HMO | Admitting: Family Medicine

## 2019-06-03 VITALS — BP 157/83 | HR 65 | Temp 97.7°F | Ht 73.0 in | Wt 286.0 lb

## 2019-06-03 DIAGNOSIS — H9319 Tinnitus, unspecified ear: Secondary | ICD-10-CM

## 2019-06-03 DIAGNOSIS — E66812 Obesity, class 2: Secondary | ICD-10-CM

## 2019-06-03 DIAGNOSIS — Z6837 Body mass index (BMI) 37.0-37.9, adult: Secondary | ICD-10-CM

## 2019-06-03 DIAGNOSIS — I10 Essential (primary) hypertension: Secondary | ICD-10-CM | POA: Diagnosis not present
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
## 2019-06-05 ENCOUNTER — Encounter (INDEPENDENT_AMBULATORY_CARE_PROVIDER_SITE_OTHER): Payer: Self-pay | Admitting: Family Medicine

## 2019-06-12 ENCOUNTER — Encounter (INDEPENDENT_AMBULATORY_CARE_PROVIDER_SITE_OTHER): Payer: Self-pay | Admitting: Family Medicine

## 2019-06-13 ENCOUNTER — Other Ambulatory Visit (INDEPENDENT_AMBULATORY_CARE_PROVIDER_SITE_OTHER): Payer: Self-pay

## 2019-06-13 DIAGNOSIS — H9319 Tinnitus, unspecified ear: Secondary | ICD-10-CM

## 2019-06-24 ENCOUNTER — Encounter (INDEPENDENT_AMBULATORY_CARE_PROVIDER_SITE_OTHER): Payer: Self-pay | Admitting: Family Medicine

## 2019-06-24 ENCOUNTER — Ambulatory Visit (INDEPENDENT_AMBULATORY_CARE_PROVIDER_SITE_OTHER): Payer: Medicare HMO | Admitting: Family Medicine

## 2019-06-24 ENCOUNTER — Other Ambulatory Visit: Payer: Self-pay

## 2019-06-24 VITALS — BP 124/66 | HR 66 | Temp 97.9°F | Ht 73.0 in | Wt 289.0 lb

## 2019-06-24 DIAGNOSIS — Z6838 Body mass index (BMI) 38.0-38.9, adult: Secondary | ICD-10-CM

## 2019-06-24 DIAGNOSIS — E559 Vitamin D deficiency, unspecified: Secondary | ICD-10-CM | POA: Diagnosis not present

## 2019-06-24 MED ORDER — VITAMIN D (ERGOCALCIFEROL) 1.25 MG (50000 UNIT) PO CAPS: 50000.0000 [IU] | ORAL_CAPSULE | ORAL | 0 refills | Status: DC

## 2019-06-25 DIAGNOSIS — H9313 Tinnitus, bilateral: Secondary | ICD-10-CM | POA: Diagnosis not present

## 2019-06-25 DIAGNOSIS — H903 Sensorineural hearing loss, bilateral: Secondary | ICD-10-CM | POA: Diagnosis not present
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
## 2019-07-02 ENCOUNTER — Other Ambulatory Visit: Payer: Self-pay | Admitting: Student in an Organized Health Care Education/Training Program

## 2019-07-18 ENCOUNTER — Encounter (INDEPENDENT_AMBULATORY_CARE_PROVIDER_SITE_OTHER): Payer: Self-pay | Admitting: Family Medicine

## 2019-07-22 ENCOUNTER — Ambulatory Visit (INDEPENDENT_AMBULATORY_CARE_PROVIDER_SITE_OTHER): Payer: Medicare HMO | Admitting: Family Medicine

## 2019-07-29 ENCOUNTER — Other Ambulatory Visit: Payer: Self-pay | Admitting: Student in an Organized Health Care Education/Training Program

## 2019-07-31 ENCOUNTER — Other Ambulatory Visit: Payer: Self-pay

## 2019-07-31 DIAGNOSIS — E7849 Other hyperlipidemia: Secondary | ICD-10-CM

## 2019-07-31 DIAGNOSIS — E119 Type 2 diabetes mellitus without complications: Secondary | ICD-10-CM

## 2019-07-31 MED ORDER — BUPROPION HCL ER (SR) 150 MG PO TB12: 150.0000 mg | ORAL_TABLET | Freq: Every day | ORAL | 0 refills | Status: DC

## 2019-07-31 MED ORDER — ROSUVASTATIN CALCIUM 40 MG PO TABS: ORAL_TABLET | ORAL | 0 refills | Status: DC

## 2019-07-31 MED ORDER — METFORMIN HCL 1000 MG PO TABS: 1000.0000 mg | ORAL_TABLET | Freq: Every day | ORAL | 0 refills | Status: DC

## 2019-07-31 NOTE — Telephone Encounter (Signed)
Pt calls nurse line and expresses frustration with refill process. Patient states that 3 of his medications were filled correctly but he was missing the other 3. Informed patient that we only received refill request for one rx on 12/28. Placed refill request for other 3 medications at this time.   Talbot Grumbling, RN

## 2019-08-05 ENCOUNTER — Other Ambulatory Visit: Payer: Self-pay

## 2019-08-05 MED ORDER — ACCU-CHEK AVIVA PLUS VI STRP: 1.0000 | ORAL_STRIP | Freq: Two times a day (BID) | 0 refills | Status: DC

## 2019-08-14 ENCOUNTER — Ambulatory Visit (INDEPENDENT_AMBULATORY_CARE_PROVIDER_SITE_OTHER): Payer: Medicare HMO | Admitting: Family Medicine

## 2019-08-14 ENCOUNTER — Encounter (INDEPENDENT_AMBULATORY_CARE_PROVIDER_SITE_OTHER): Payer: Self-pay | Admitting: Family Medicine

## 2019-08-14 ENCOUNTER — Other Ambulatory Visit: Payer: Self-pay

## 2019-08-14 VITALS — BP 157/85 | HR 75 | Temp 98.0°F | Ht 73.0 in | Wt 291.0 lb

## 2019-08-14 DIAGNOSIS — E119 Type 2 diabetes mellitus without complications: Secondary | ICD-10-CM

## 2019-08-14 DIAGNOSIS — Z7189 Other specified counseling: Secondary | ICD-10-CM

## 2019-08-14 DIAGNOSIS — E7849 Other hyperlipidemia: Secondary | ICD-10-CM | POA: Diagnosis not present

## 2019-08-14 DIAGNOSIS — E559 Vitamin D deficiency, unspecified: Secondary | ICD-10-CM | POA: Diagnosis not present

## 2019-08-14 DIAGNOSIS — I10 Essential (primary) hypertension: Secondary | ICD-10-CM | POA: Diagnosis not present

## 2019-08-14 DIAGNOSIS — Z6838 Body mass index (BMI) 38.0-38.9, adult: Secondary | ICD-10-CM | POA: Diagnosis not present

## 2019-08-15 LAB — INSULIN, RANDOM: INSULIN: 10 u[IU]/mL (ref 2.6–24.9)

## 2019-08-15 LAB — COMPREHENSIVE METABOLIC PANEL
ALT: 23 IU/L (ref 0–44)
AST: 20 IU/L (ref 0–40)
Albumin/Globulin Ratio: 2 (ref 1.2–2.2)
Albumin: 4.7 g/dL (ref 3.7–4.7)
Alkaline Phosphatase: 64 IU/L (ref 39–117)
BUN/Creatinine Ratio: 22 (ref 10–24)
BUN: 26 mg/dL (ref 8–27)
Bilirubin Total: 0.5 mg/dL (ref 0.0–1.2)
CO2: 24 mmol/L (ref 20–29)
Calcium: 9.6 mg/dL (ref 8.6–10.2)
Chloride: 103 mmol/L (ref 96–106)
Creatinine, Ser: 1.2 mg/dL (ref 0.76–1.27)
GFR calc Af Amer: 69 mL/min/{1.73_m2} (ref >59)
GFR calc non Af Amer: 60 mL/min/{1.73_m2} (ref >59)
Globulin, Total: 2.3 g/dL (ref 1.5–4.5)
Glucose: 120 mg/dL — ABNORMAL HIGH (ref 65–99)
Potassium: 3.8 mmol/L (ref 3.5–5.2)
Sodium: 142 mmol/L (ref 134–144)
Total Protein: 7 g/dL (ref 6.0–8.5)

## 2019-08-15 LAB — LIPID PANEL WITH LDL/HDL RATIO
Cholesterol, Total: 195 mg/dL (ref 100–199)
HDL: 43 mg/dL (ref >39)
LDL Chol Calc (NIH): 131 mg/dL — ABNORMAL HIGH (ref 0–99)
LDL/HDL Ratio: 3 ratio (ref 0.0–3.6)
Triglycerides: 116 mg/dL (ref 0–149)
VLDL Cholesterol Cal: 21 mg/dL (ref 5–40)

## 2019-08-15 LAB — VITAMIN D 25 HYDROXY (VIT D DEFICIENCY, FRACTURES): Vit D, 25-Hydroxy: 57.1 ng/mL (ref 30.0–100.0)

## 2019-08-15 LAB — HEMOGLOBIN A1C
Est. average glucose Bld gHb Est-mCnc: 126 mg/dL
Hgb A1c MFr Bld: 6 % — ABNORMAL HIGH (ref 4.8–5.6)

## 2019-08-18 NOTE — Progress Notes (Signed)
Chief Complaint:   OBESITY [name] [name] is here to discuss his progress with his obesity treatment plan along with follow-up of his obesity related diagnoses. [name] [name] is following a lower carbohydrate, vegetable and lean protein rich diet plan and states he is following his eating plan approximately 80% of the time. [name] [name] states he is walking 15 minutes 3 times per week.  Today's visit was #: 8 Starting weight: 340 lbs Starting date: 12/28/2016 Today's weight: 291 lbs Today's date: 08/14/2019 Total lbs lost to date: 49 Total lbs lost since last in-office visit: 0  Interim History: [name] [name] has been on the low carb plan eight to ten months. He is up 4 pounds over the last few months and he admits to eating some carbs, such as stuffing and chips. He reports his activity is also less than it was.  Subjective:   Essential hypertension [name] [name]'s blood pressure is elevated today. He admits to increased sodium in his diet. He is compliant with all medications. [name] [name] does not check his blood pressure at home..  BP Readings from Last 3 Encounters:  08/14/19 (!) 157/85  06/24/19 124/66  06/03/19 (!) 157/83   Lab Results  Component Value Date   CREATININE 1.20 08/14/2019   CREATININE 1.15 03/25/2019   CREATININE 1.38 (H) 09/24/2018   Type 2 diabetes mellitus without complication, without long-term current use of insulin ([place]) DM is well controlled with metformin only. His last A1c was 6.0 (08/14/19). His fasting blood sugar was 120 today. He denies hypoglycemia.   Lab Results  Component Value Date   HGBA1C 6.0 (H) 08/14/2019   HGBA1C 6.0 (H) 03/25/2019   HGBA1C 6.2 12/17/2018   Lab Results  Component Value Date   LDLCALC 131 (H) 08/14/2019   CREATININE 1.20 08/14/2019   Other hyperlipidemia [name] [name] has hyperlipidemia and his LDL is not quite at goal on Crestor 40 mg. His last LDL was 114, HDL 43 and triglycerides 87 (03/25/19). He has been trying to improve his cholesterol  levels with intensive lifestyle modification including a low saturated fat diet, exercise and weight loss.   Lab Results  Component Value Date   ALT 23 08/14/2019   AST 20 08/14/2019   ALKPHOS 64 08/14/2019   BILITOT 0.5 08/14/2019   Lab Results  Component Value Date   CHOL 195 08/14/2019   HDL 43 08/14/2019   LDLCALC 131 (H) 08/14/2019   LDLDIRECT 148 (H) 11/15/2007   TRIG 116 08/14/2019   CHOLHDL 5.2 (H) 01/26/2016    Vitamin D deficiency - Plan: Vitamin D (25 hydroxy) Treyten's Vitamin D level was 67.1 on 03/25/19. He is currently taking prescription vit D every 14 days. He denies nausea, vomiting or muscle weakness.  Educated about COVID-19 virus infection We discussed the COVID vaccine and he is unsure if he will get it. He is practicing social distancing, hand washing and wearing a face mask.  Assessment/Plan:   Essential hypertension [name] [name] is working on healthy weight loss and exercise to improve blood pressure control. He will continue all medications and work on decreasing sodium in his diet.   Type 2 diabetes mellitus without complication, without long-term current use of insulin ([name] [name])  [name] [name] will continue metformin. We will check A1c, fasting glucose and insulin level.  Other hyperlipidemia  Cardiovascular risk and specific lipid/LDL goals reviewed.  We discussed several lifestyle modifications today and [name] [name] will continue to work on diet, exercise and weight loss efforts. [name] [name] will continue Crestor daily. We will check  fasting lipid panel today. Orders and follow up as documented in patient record.   Vitamin D deficiency Low Vitamin D level contributes to fatigue and are associated with obesity, breast, and colon cancer. [name] [name] will continue to take prescription Vitamin D @50 ,000 IU every 14 days and we will check vitamin D level today. [name] [name] will follow-up for routine testing of Vitamin D, at least 2-3 times per year to avoid over-replacement.  Educated about  COVID-19 virus infection We will continue to monitor. Orders and follow up as documented in patient record.  Counseling   Get vaccinated as soon as they are available to you.  For our most current information, please visit [email or web address].  Wash your hands often with soap and water for 20 seconds. If soap and water are not available, use alcohol-based hand sanitizer.  Wear a face mask. Make sure your mask covers your nose and mouth.  Maintain at least 6 feet distance from others when in public.   Obesity [name] [name] is currently in the action stage of change. As such, his goal is to continue with weight loss efforts. He has agreed to following a lower carbohydrate, vegetable and lean protein rich diet plan.   [name] [name] will increase exercise to 15 minutes, 2 times per day, 5 days per week.  Behavioral modification strategies: decreasing simple carbohydrates, decreasing sodium intake and planning for success.  [name] [name] has agreed to follow-up with our clinic in 3 weeks. He was informed of the importance of frequent follow-up visits to maximize his success with intensive lifestyle modifications for his multiple health conditions.   [name] [name] was informed we would discuss his lab results at his next visit unless there is a critical issue that needs to be addressed sooner. [name] [name] agreed to keep his next visit at the agreed upon time to discuss these results.  Objective:   Blood pressure (!) 157/85, pulse 75, temperature 98 F (36.7 C), height 6\' 1"  (1.854 m), weight 291 lb (132 kg), SpO2 97 %. Body mass index is 38.39 kg/m.  General: Cooperative, alert, well developed, in no acute distress. HEENT: Conjunctivae and lids unremarkable. Cardiovascular: Regular rhythm.  Lungs: Normal work of breathing. Neurologic: No focal deficits.   Lab Results  Component Value Date   CREATININE 1.20 08/14/2019   BUN 26 08/14/2019   NA 142 08/14/2019   K 3.8 08/14/2019   CL 103 08/14/2019   CO2  24 08/14/2019   Lab Results  Component Value Date   ALT 23 08/14/2019   AST 20 08/14/2019   ALKPHOS 64 08/14/2019   BILITOT 0.5 08/14/2019   Lab Results  Component Value Date   HGBA1C 6.0 (H) 08/14/2019   HGBA1C 6.0 (H) 03/25/2019   HGBA1C 6.2 12/17/2018   HGBA1C 6.9 (H) 09/24/2018   HGBA1C 6.4 (H) 05/03/2018   Lab Results  Component Value Date   INSULIN 10.0 08/14/2019   INSULIN 12.4 03/25/2019   INSULIN 19.2 09/24/2018   INSULIN 13.7 05/03/2018   INSULIN 15.7 07/19/2017   Lab Results  Component Value Date   TSH 1.500 03/25/2019   Lab Results  Component Value Date   CHOL 195 08/14/2019   HDL 43 08/14/2019   LDLCALC 131 (H) 08/14/2019   LDLDIRECT 148 (H) 11/15/2007   TRIG 116 08/14/2019   CHOLHDL 5.2 (H) 01/26/2016   Lab Results  Component Value Date   WBC 7.3 11/30/2017   HGB 15.0 11/30/2017   HCT 46.8 11/30/2017   MCV 86 11/30/2017   PLT 293 11/30/2017  No results found for: IRON, TIBC, FERRITIN    Ref. Range 03/25/2019 10:45  Vitamin D, 25-Hydroxy Latest Ref Range: 30.0 - 100.0 ng/mL 67.1    Obesity Behavioral Intervention Documentation for Insurance:   Approximately 15 minutes were spent on the discussion below.  ASK: We discussed the diagnosis of obesity with [name] [name] today and [name] [name] agreed to give [name] permission to discuss obesity behavioral modification therapy today.  ASSESS: [name] [name] has the diagnosis of obesity and his BMI today is 38.4. [name] [name] is in the action stage of change.   ADVISE: [name] [name] was educated on the multiple health risks of obesity as well as the benefit of weight loss to improve his health. He was advised of the need for long term treatment and the importance of lifestyle modifications to improve his current health and to decrease his risk of future health problems.  AGREE: Multiple dietary modification options and treatment options were discussed and [name] [name] agreed to follow the recommendations documented in the above  note.  ARRANGE: [name] [name] was educated on the importance of frequent visits to treat obesity as outlined per CMS and USPSTF guidelines and agreed to schedule his next follow up appointment today.  Attestation Statements:   Reviewed by clinician on day of visit: allergies, medications, problem list, medical history, surgical history, family history, social history, and previous encounter notes.  [name] [name], am acting as Location manager for [employer], FNP-C.  I have reviewed the above documentation for accuracy and completeness, and I agree with the above. -  Porshea Janowski, FNP-C  I have reviewed the above documentation for accuracy and completeness, and I agree with the above.  - Samona Chihuahua, FNP-C.

## 2019-08-19 ENCOUNTER — Encounter (INDEPENDENT_AMBULATORY_CARE_PROVIDER_SITE_OTHER): Payer: Self-pay | Admitting: Family Medicine

## 2019-08-28 DIAGNOSIS — G4733 Obstructive sleep apnea (adult) (pediatric): Secondary | ICD-10-CM | POA: Diagnosis not present

## 2019-09-02 DIAGNOSIS — H0102A Squamous blepharitis right eye, upper and lower eyelids: Secondary | ICD-10-CM | POA: Diagnosis not present

## 2019-09-02 DIAGNOSIS — Z961 Presence of intraocular lens: Secondary | ICD-10-CM | POA: Diagnosis not present

## 2019-09-02 DIAGNOSIS — H0102B Squamous blepharitis left eye, upper and lower eyelids: Secondary | ICD-10-CM | POA: Diagnosis not present

## 2019-09-02 DIAGNOSIS — H43811 Vitreous degeneration, right eye: Secondary | ICD-10-CM | POA: Diagnosis not present

## 2019-09-02 DIAGNOSIS — E119 Type 2 diabetes mellitus without complications: Secondary | ICD-10-CM | POA: Diagnosis not present

## 2019-09-02 LAB — HM DIABETES EYE EXAM

## 2019-09-09 ENCOUNTER — Ambulatory Visit (INDEPENDENT_AMBULATORY_CARE_PROVIDER_SITE_OTHER): Payer: Medicare HMO | Admitting: Family Medicine

## 2019-09-09 ENCOUNTER — Encounter (INDEPENDENT_AMBULATORY_CARE_PROVIDER_SITE_OTHER): Payer: Self-pay | Admitting: Family Medicine

## 2019-09-09 ENCOUNTER — Other Ambulatory Visit: Payer: Self-pay

## 2019-09-09 VITALS — BP 117/72 | HR 70 | Temp 98.2°F | Ht 73.0 in | Wt 295.0 lb

## 2019-09-09 DIAGNOSIS — E66812 Obesity, class 2: Secondary | ICD-10-CM

## 2019-09-09 DIAGNOSIS — E119 Type 2 diabetes mellitus without complications: Secondary | ICD-10-CM | POA: Diagnosis not present

## 2019-09-09 DIAGNOSIS — Z6839 Body mass index (BMI) 39.0-39.9, adult: Secondary | ICD-10-CM | POA: Diagnosis not present

## 2019-09-09 DIAGNOSIS — F3289 Other specified depressive episodes: Secondary | ICD-10-CM

## 2019-09-09 MED ORDER — BUPROPION HCL ER (SR) 150 MG PO TB12: 150.0000 mg | ORAL_TABLET | Freq: Every day | ORAL | 0 refills | Status: DC

## 2019-09-09 NOTE — Telephone Encounter (Signed)
Please advise 

## 2019-09-09 NOTE — Progress Notes (Signed)
Chief Complaint:   OBESITY [name] [name] is here to discuss his progress with his obesity treatment plan along with follow-up of his obesity related diagnoses. [name] [name] is on following a lower carbohydrate, vegetable and lean protein rich diet plan and states he is following his eating plan approximately 95% of the time. [name] [name] states he is walking for 20 minutes 2 times per week.  Today's visit was #: 3 Starting weight: 340 lbs Starting date: 12/28/16 Today's weight: 295 lbs Today's date: 09/09/2019 Total lbs lost to date: 45 Total lbs lost since last in-office visit: 0  Interim History: [name] [name] has struggled more with weight loss in the last 2 months. He hasn't used his CPAP as often due to falling asleep before he puts his mask on, and this may be decreasing his RMR.  Subjective:   1. Type 2 diabetes mellitus without complication, without long-term current use of insulin (HCC) [name]'s A1c is well controlled with diet and medications. Last A1c was 6.0. He denies hypoglycemia. I discussed labs with the patient today.  2. Other depression, with emotional eating [name] [name]'s mood has decreased and he notes increased frustration and increased sadness. He lives by himself and this year has been difficult.   Assessment/Plan:   1. Type 2 diabetes mellitus without complication, without long-term current use of insulin (HCC) Good blood sugar control is important to decrease the likelihood of diabetic complications such as nephropathy, neuropathy, limb loss, blindness, coronary artery disease, and death. Intensive lifestyle modification including diet, exercise and weight loss are the first line of treatment for diabetes. [name] will continue diet and exercise, and will follow up as directed.  2. Other depression, with emotional eating Behavior modification techniques were discussed today to help Poul deal with his emotional/non-hunger eating behaviors. [name] [name] agreed to restart Wellbutrin SR 150 mg  daily with no refill. Orders and follow up as documented in patient record.   - buPROPion (WELLBUTRIN SR) 150 MG 12 hr tablet; Take 1 tablet (150 mg total) by mouth daily.  Dispense: 30 tablet; Refill: 0  3. Class 2 severe obesity with serious comorbidity and body mass index (BMI) of 39.0 to 39.9 in adult, unspecified obesity type [name]) [name] [name] is currently in the action stage of change. As such, his goal is to continue with weight loss efforts. He has agreed to following a lower carbohydrate, vegetable and lean protein rich diet plan.   [name] [name] was encouraged to use his CPAP and we will recheck his IC at his next visit.  Exercise goals: [name] [name] is to continue his exercise with walking videos.  Behavioral modification strategies: increasing lean protein intake.  [name] [name] has agreed to follow-up with our clinic in 3 weeks. He was informed of the importance of frequent follow-up visits to maximize his success with intensive lifestyle modifications for his multiple health conditions.   Objective:   Blood pressure 117/72, pulse 70, temperature 98.2 F (36.8 C), temperature source Oral, height 6\' 1"  (1.854 m), weight 295 lb (133.8 kg), SpO2 97 %. Body mass index is 38.92 kg/m.  General: Cooperative, alert, well developed, in no acute distress. HEENT: Conjunctivae and lids unremarkable. Cardiovascular: Regular rhythm.  Lungs: Normal work of breathing. Neurologic: No focal deficits.   Lab Results  Component Value Date   CREATININE 1.20 08/14/2019   BUN 26 08/14/2019   NA 142 08/14/2019   K 3.8 08/14/2019   CL 103 08/14/2019   CO2 24 08/14/2019   Lab Results  Component Value Date   ALT  23 08/14/2019   AST 20 08/14/2019   ALKPHOS 64 08/14/2019   BILITOT 0.5 08/14/2019   Lab Results  Component Value Date   HGBA1C 6.0 (H) 08/14/2019   HGBA1C 6.0 (H) 03/25/2019   HGBA1C 6.2 12/17/2018   HGBA1C 6.9 (H) 09/24/2018   HGBA1C 6.4 (H) 05/03/2018   Lab Results  Component Value Date    INSULIN 10.0 08/14/2019   INSULIN 12.4 03/25/2019   INSULIN 19.2 09/24/2018   INSULIN 13.7 05/03/2018   INSULIN 15.7 07/19/2017   Lab Results  Component Value Date   TSH 1.500 03/25/2019   Lab Results  Component Value Date   CHOL 195 08/14/2019   HDL 43 08/14/2019   LDLCALC 131 (H) 08/14/2019   LDLDIRECT 148 (H) 11/15/2007   TRIG 116 08/14/2019   CHOLHDL 5.2 (H) 01/26/2016   Lab Results  Component Value Date   WBC 7.3 11/30/2017   HGB 15.0 11/30/2017   HCT 46.8 11/30/2017   MCV 86 11/30/2017   PLT 293 11/30/2017   No results found for: IRON, TIBC, FERRITIN  Obesity Behavioral Intervention Documentation for Insurance:   Approximately 15 minutes were spent on the discussion below.  ASK: We discussed the diagnosis of obesity with [name] [name] today and [name] [name] agreed to give [name] [name] permission to discuss obesity behavioral modification therapy today.  ASSESS: [name] [name] has the diagnosis of obesity and his BMI today is 38.93. [name] [name] is in the action stage of change.   ADVISE: [name] [name] was educated on the multiple health risks of obesity as well as the benefit of weight loss to improve his health. He was advised of the need for long term treatment and the importance of lifestyle modifications to improve his current health and to decrease his risk of future health problems.  AGREE: Multiple dietary modification options and treatment options were discussed and [name] [name] agreed to follow the recommendations documented in the above note.  ARRANGE: [name] [name] was educated on the importance of frequent visits to treat obesity as outlined per CMS and USPSTF guidelines and agreed to schedule his next follow up appointment today.  Attestation Statements:   Reviewed by clinician on day of visit: allergies, medications, problem list, medical history, surgical history, family history, social history, and previous encounter notes.   I, Trixie Dredge, am acting as transcriptionist for [name] Nip,  MD.  I have reviewed the above documentation for accuracy and completeness, and I agree with the above. -  [name] Nip, MD

## 2019-09-10 NOTE — Telephone Encounter (Signed)
[name] [name], please increase his wellbutrin dose to sr 200 qam #30 No RF

## 2019-09-10 NOTE — Telephone Encounter (Signed)
Please advise 

## 2019-09-11 DIAGNOSIS — N393 Stress incontinence (female) (male): Secondary | ICD-10-CM | POA: Diagnosis not present

## 2019-09-11 DIAGNOSIS — N5201 Erectile dysfunction due to arterial insufficiency: Secondary | ICD-10-CM | POA: Diagnosis not present

## 2019-09-11 DIAGNOSIS — Z8546 Personal history of malignant neoplasm of prostate: Secondary | ICD-10-CM | POA: Diagnosis not present

## 2019-09-12 MED ORDER — BUPROPION HCL ER (SR) 200 MG PO TB12: 200.0000 mg | ORAL_TABLET | Freq: Every day | ORAL | 0 refills | Status: DC

## 2019-09-24 ENCOUNTER — Telehealth: Payer: Medicare HMO | Admitting: Family Medicine

## 2019-09-24 ENCOUNTER — Telehealth: Payer: Self-pay

## 2019-09-24 NOTE — Telephone Encounter (Signed)
Patient was a no show for their virtual visit today.  

## 2019-09-26 ENCOUNTER — Ambulatory Visit: Payer: Medicare HMO | Admitting: Family Medicine

## 2019-09-26 ENCOUNTER — Other Ambulatory Visit: Payer: Self-pay

## 2019-09-26 ENCOUNTER — Encounter: Payer: Self-pay | Admitting: Family Medicine

## 2019-09-26 VITALS — BP 124/70 | HR 78 | Temp 97.6°F | Ht 73.0 in | Wt 309.4 lb

## 2019-09-26 DIAGNOSIS — Z9989 Dependence on other enabling machines and devices: Secondary | ICD-10-CM | POA: Diagnosis not present

## 2019-09-26 DIAGNOSIS — G4733 Obstructive sleep apnea (adult) (pediatric): Secondary | ICD-10-CM
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
## 2019-09-26 NOTE — Patient Instructions (Signed)
Continue CPAP nightly and greater than 4 hours each night   Follow up in 1 year, sooner if needed   Sleep Apnea Sleep apnea affects breathing during sleep. It causes breathing to stop for a short time or to become shallow. It can also increase the risk of:  Heart attack.  Stroke.  Being very overweight (obese).  Diabetes.  Heart failure.  Irregular heartbeat. The goal of treatment is to help you breathe normally again. What are the causes? There are three kinds of sleep apnea:  Obstructive sleep apnea. This is caused by a blocked or collapsed airway.  Central sleep apnea. This happens when the brain does not send the right signals to the muscles that control breathing.  Mixed sleep apnea. This is a combination of obstructive and central sleep apnea. The most common cause of this condition is a collapsed or blocked airway. This can happen if:  Your throat muscles are too relaxed.  Your tongue and tonsils are too large.  You are overweight.  Your airway is too small. What increases the risk?  Being overweight.  Smoking.  Having a small airway.  Being older.  Being male.  Drinking alcohol.  Taking medicines to calm yourself (sedatives or tranquilizers).  Having family members with the condition. What are the signs or symptoms?  Trouble staying asleep.  Being sleepy or tired during the day.  Getting angry a lot.  Loud snoring.  Headaches in the morning.  Not being able to focus your mind (concentrate).  Forgetting things.  Less interest in sex.  Mood swings.  Personality changes.  Feelings of sadness (depression).  Waking up a lot during the night to pee (urinate).  Dry mouth.  Sore throat. How is this diagnosed?  Your medical history.  A physical exam.  A test that is done when you are sleeping (sleep study). The test is most often done in a sleep lab but may also be done at home. How is this treated?   Sleeping on your  side.  Using a medicine to get rid of mucus in your nose (decongestant).  Avoiding the use of alcohol, medicines to help you relax, or certain pain medicines (narcotics).  Losing weight, if needed.  Changing your diet.  Not smoking.  Using a machine to open your airway while you sleep, such as: ? An oral appliance. This is a mouthpiece that shifts your lower jaw forward. ? A CPAP device. This device blows air through a mask when you breathe out (exhale). ? An EPAP device. This has valves that you put in each nostril. ? A BPAP device. This device blows air through a mask when you breathe in (inhale) and breathe out.  Having surgery if other treatments do not work. It is important to get treatment for sleep apnea. Without treatment, it can lead to:  High blood pressure.  Coronary artery disease.  In men, not being able to have an erection (impotence).  Reduced thinking ability. Follow these instructions at home: Lifestyle  Make changes that your doctor recommends.  Eat a healthy diet.  Lose weight if needed.  Avoid alcohol, medicines to help you relax, and some pain medicines.  Do not use any products that contain nicotine or tobacco, such as cigarettes, e-cigarettes, and chewing tobacco. If you need help quitting, ask your doctor. General instructions  Take over-the-counter and prescription medicines only as told by your doctor.  If you were given a machine to use while you sleep, use it only   as told by your doctor.  If you are having surgery, make sure to tell your doctor you have sleep apnea. You may need to bring your device with you.  Keep all follow-up visits as told by your doctor. This is important. Contact a doctor if:  The machine that you were given to use during sleep bothers you or does not seem to be working.  You do not get better.  You get worse. Get help right away if:  Your chest hurts.  You have trouble breathing in enough air.  You have  an uncomfortable feeling in your back, arms, or stomach.  You have trouble talking.  One side of your body feels weak.  A part of your face is hanging down. These symptoms may be an emergency. Do not wait to see if the symptoms will go away. Get medical help right away. Call your local emergency services (911 in the [place] Do not drive yourself to the hospital. Summary  This condition affects breathing during sleep.  The most common cause is a collapsed or blocked airway.  The goal of treatment is to help you breathe normally while you sleep. This information is not intended to replace advice given to you by your health care provider. Make sure you discuss any questions you have with your health care provider. Document Revised: 05/04/2018 Document Reviewed: 03/13/2018 Elsevier Patient Education  2020 [employer]  

## 2019-09-30 ENCOUNTER — Other Ambulatory Visit: Payer: Self-pay

## 2019-09-30 ENCOUNTER — Ambulatory Visit (INDEPENDENT_AMBULATORY_CARE_PROVIDER_SITE_OTHER): Payer: Medicare HMO | Admitting: Family Medicine

## 2019-09-30 ENCOUNTER — Encounter (INDEPENDENT_AMBULATORY_CARE_PROVIDER_SITE_OTHER): Payer: Self-pay | Admitting: Family Medicine

## 2019-09-30 VITALS — BP 124/78 | HR 85 | Temp 97.7°F | Ht 73.0 in | Wt 299.0 lb

## 2019-09-30 DIAGNOSIS — Z6839 Body mass index (BMI) 39.0-39.9, adult: Secondary | ICD-10-CM

## 2019-09-30 DIAGNOSIS — E119 Type 2 diabetes mellitus without complications: Secondary | ICD-10-CM

## 2019-09-30 DIAGNOSIS — G4733 Obstructive sleep apnea (adult) (pediatric): Secondary | ICD-10-CM

## 2019-09-30 NOTE — Progress Notes (Signed)
Chief Complaint:   OBESITY [name] [name] is here to discuss his progress with his obesity treatment plan along with follow-up of his obesity related diagnoses. [name] [name] is on following a lower carbohydrate, vegetable and lean protein rich diet plan and states he is following his eating plan approximately 95% of the time. [name] [name] states he is walking for 10-15 minutes 3-4 times per week.  Today's visit was #: 84 Starting weight: 340 lbs Starting date: 12/28/2016 Today's weight: 299 lbs Today's date: 09/30/2019 Total lbs lost to date: 41 Total lbs lost since last in-office visit: 0  Interim History: [name] [name] has been struggling with losing weight, and he is retaining some water today. He is getting ready to start some strengthening exercises, and should be a brochure on his new exercise bands with exercise examples. His RMR has started to fall again.  Subjective:   1. Type 2 diabetes mellitus without complication, without long-term current use of insulin (HCC) [name] [name]'s diabetes is very well controlled. Last A1c was 6.0, and his fasting BGs range mostly in 120's. His post prandials are usually between 120 and 150. He denies hypoglycemia.  2. Obstructive sleep apnea syndrome [name] [name]'s on CPAP reliability. He denies morning headaches and he sleeps 6-7 hours most nights.  Assessment/Plan:   1. Type 2 diabetes mellitus without complication, without long-term current use of insulin (HCC) Cael will continue his medications, diet, and exercise. Good blood sugar control is important to decrease the likelihood of diabetic complications such as nephropathy, neuropathy, limb loss, blindness, coronary artery disease, and death. Intensive lifestyle modification including diet, exercise and weight loss are the first line of treatment for diabetes.   2. Obstructive sleep apnea syndrome [name] [name]'s IC was done today, and it seems his RMR has started to decrease but this is unlikely due to decrease in oxygen.  Intensive lifestyle modifications are the first line treatment for this issue. We discussed several lifestyle modifications today. [name] [name] will continue with weight loss, diet, and exercise. We will continue to monitor. Orders and follow up as documented in patient record.   3. Class 2 severe obesity with serious comorbidity and body mass index (BMI) of 39.0 to 39.9 in adult, unspecified obesity type [facility]) [name] [name] is currently in the action stage of change. As such, his goal is to continue with weight loss efforts. He has agreed to change to the Category 3 Plan.   Exercise goals: [name] [name] is to continue his current exercise regimen as is, and add exercise bands for 15 minutes per day.  Behavioral modification strategies: increasing lean protein intake and no skipping meals.  [name] [name] has agreed to follow-up with our clinic in 3 weeks. He was informed of the importance of frequent follow-up visits to maximize his success with intensive lifestyle modifications for his multiple health conditions.   Objective:   Blood pressure 124/78, pulse 85, temperature 97.7 F (36.5 C), temperature source Oral, height 6\' 1"  (1.854 m), weight 299 lb (135.6 kg), SpO2 96 %. Body mass index is 39.45 kg/m.  General: Cooperative, alert, well developed, in no acute distress. HEENT: Conjunctivae and lids unremarkable. Cardiovascular: Regular rhythm.  Lungs: Normal work of breathing. Neurologic: No focal deficits.   Lab Results  Component Value Date   CREATININE 1.20 08/14/2019   BUN 26 08/14/2019   NA 142 08/14/2019   K 3.8 08/14/2019   CL 103 08/14/2019   CO2 24 08/14/2019   Lab Results  Component Value Date   ALT 23 08/14/2019   AST 20  08/14/2019   ALKPHOS 64 08/14/2019   BILITOT 0.5 08/14/2019   Lab Results  Component Value Date   HGBA1C 6.0 (H) 08/14/2019   HGBA1C 6.0 (H) 03/25/2019   HGBA1C 6.2 12/17/2018   HGBA1C 6.9 (H) 09/24/2018   HGBA1C 6.4 (H) 05/03/2018   Lab Results  Component Value  Date   INSULIN 10.0 08/14/2019   INSULIN 12.4 03/25/2019   INSULIN 19.2 09/24/2018   INSULIN 13.7 05/03/2018   INSULIN 15.7 07/19/2017   Lab Results  Component Value Date   TSH 1.500 03/25/2019   Lab Results  Component Value Date   CHOL 195 08/14/2019   HDL 43 08/14/2019   LDLCALC 131 (H) 08/14/2019   LDLDIRECT 148 (H) 11/15/2007   TRIG 116 08/14/2019   CHOLHDL 5.2 (H) 01/26/2016   Lab Results  Component Value Date   WBC 7.3 11/30/2017   HGB 15.0 11/30/2017   HCT 46.8 11/30/2017   MCV 86 11/30/2017   PLT 293 11/30/2017   No results found for: IRON, TIBC, FERRITIN  Attestation Statements:   Reviewed by clinician on day of visit: allergies, medications, problem list, medical history, surgical history, family history, social history, and previous encounter notes.  Time spent on visit including pre-visit chart review and post-visit care and charting was 30 minutes.    I, Trixie Dredge, am acting as transcriptionist for [name], MD.  I have reviewed the above documentation for accuracy and completeness, and I agree with the above. -  [name], MD

## 2019-10-01 ENCOUNTER — Encounter (INDEPENDENT_AMBULATORY_CARE_PROVIDER_SITE_OTHER): Payer: Self-pay | Admitting: Family Medicine

## 2019-10-01 NOTE — Telephone Encounter (Signed)
Please advise 

## 2019-10-03 ENCOUNTER — Other Ambulatory Visit: Payer: Self-pay

## 2019-10-03 MED ORDER — LOSARTAN POTASSIUM 50 MG PO TABS: ORAL_TABLET | ORAL | 0 refills | Status: DC

## 2019-10-03 MED ORDER — FUROSEMIDE 40 MG PO TABS: 40.0000 mg | ORAL_TABLET | Freq: Every day | ORAL | 0 refills | Status: DC

## 2019-10-15 DIAGNOSIS — R350 Frequency of micturition: Secondary | ICD-10-CM | POA: Diagnosis not present

## 2019-10-15 DIAGNOSIS — N393 Stress incontinence (female) (male): Secondary | ICD-10-CM | POA: Diagnosis not present

## 2019-10-25 ENCOUNTER — Ambulatory Visit (INDEPENDENT_AMBULATORY_CARE_PROVIDER_SITE_OTHER): Payer: Medicare HMO | Admitting: Family Medicine

## 2019-10-25 ENCOUNTER — Ambulatory Visit
Admission: RE | Admit: 2019-10-25 | Discharge: 2019-10-25 | Disposition: A | Discharge status: Home / Self Care | Payer: Medicare HMO | Source: Ambulatory Visit | Attending: Family Medicine | Admitting: Family Medicine

## 2019-10-25 ENCOUNTER — Other Ambulatory Visit: Payer: Self-pay

## 2019-10-25 VITALS — BP 138/70 | HR 79 | Ht 73.0 in | Wt 302.2 lb

## 2019-10-25 DIAGNOSIS — R0781 Pleurodynia: Secondary | ICD-10-CM

## 2019-10-25 MED ORDER — METHOCARBAMOL 500 MG PO TABS: 500.0000 mg | ORAL_TABLET | Freq: Four times a day (QID) | ORAL | 0 refills | Status: DC

## 2019-10-25 NOTE — Patient Instructions (Addendum)
I am sorry that you have had this worsening back pain recently [name] [name].  I think we will start with a chest x-ray today which is mainly to rule out any bony changes that may be causing this pain.  I have a low suspicion that we will find anything on chest x-ray.  I think this is most likely a muscle or tendon problem that simply never improved because it is difficult to rest your chest muscles.  Lets focus on pain control with Tylenol you can take as much is 1000 mg every 6-8 hours.  I will also give you a muscle relaxer be careful taking this muscle relaxer because it will make you drowsy.  I am going to refer you to physical therapy to see if that will be helpful.

## 2019-10-25 NOTE — Progress Notes (Signed)
    SUBJECTIVE:   CHIEF COMPLAINT / HPI:   Rib pain [name] [name] reports that he has been having ongoing back pain for roughly the past 2 years.  He did not have an initial traumatic injury although he does remember 1 day of hard labor of moving shingles up to his roof and he has been having this difficult back pain ever since.  He was seen for this issue shortly after his initial injury chest x-rays were unremarkable.  His current pain is described as a sharp 8/10 pain in his right upper back.  He notices this pain most with rotational movement.  There is present with almost any movement including standing or walking.  He had initially tried Tylenol and Robaxin which did give him some relief although he feels that he never completely healed from this issue.  He has been tolerating it for roughly the past 2 years but it seems to have significantly worsened in the past 2 weeks and he decided to come back in for reassessment.  He denies any pain specific to his spine or lower back.  He denies shortness of breath.   PERTINENT  PMH / PSH: History of prostate cancer, degenerative disc disease  OBJECTIVE:   BP 138/70   Pulse 79   Ht 6\' 1"  (1.854 m)   Wt (!) 302 lb 3.2 oz (137.1 kg)   SpO2 97%   BMI 39.87 kg/m    General: Resting comfortably sitting in his chair in the exam room.  Significant discomfort when sitting up and moving to the exam table. Musculoskeletal: No tenderness to percussion of the spine.  No paraspinal tenderness.  No tenderness of the SI joints.  Significant tenderness to palpation of the ribs on the right-hand side around the posterior axillary line.  Reproducible with axial rotation of the spine.  ASSESSMENT/PLAN:   Rib pain on right side Low suspicion for fracture or bone injury as this would have healed in the past 2 years.  Low suspicion for bone lesions related to prostate cancer but will follow up with a chest x-ray. Thoracic MR spine from 2017 shows multilevel  degenerative changes along the thoracic spine in addition to multiple areas of disc protrusions.  Though not a typical presentation, this may be contributing to his current rib pain.  Most likely soft tissue injury which is not healed well due to continuous use. -Ambulatory referral to physical therapy -Encouraged Tylenol use for pain control.  No NSAIDs due to significant heart history -a short prescription of Robaxin was provided.  He was encouraged to take his first dose at night because it would likely make him drowsy     [name] Haymaker, MD [place]

## 2019-10-26 NOTE — Assessment & Plan Note (Addendum)
Low suspicion for fracture or bone injury as this would have healed in the past 2 years.  Low suspicion for bone lesions related to prostate cancer but will follow up with a chest x-ray. Thoracic MR spine from 2017 shows multilevel degenerative changes along the thoracic spine in addition to multiple areas of disc protrusions.  Though not a typical presentation, this may be contributing to his current rib pain.  Most likely soft tissue injury which is not healed well due to continuous use. -Ambulatory referral to physical therapy -Encouraged Tylenol use for pain control.  No NSAIDs due to significant heart history -a short prescription of Robaxin was provided.  He was encouraged to take his first dose at night because it would likely make him drowsy

## 2019-10-28 ENCOUNTER — Other Ambulatory Visit: Payer: Self-pay

## 2019-10-28 ENCOUNTER — Encounter (INDEPENDENT_AMBULATORY_CARE_PROVIDER_SITE_OTHER): Payer: Self-pay | Admitting: Family Medicine

## 2019-10-28 ENCOUNTER — Ambulatory Visit (INDEPENDENT_AMBULATORY_CARE_PROVIDER_SITE_OTHER): Payer: Medicare HMO | Admitting: Family Medicine

## 2019-10-28 VITALS — BP 121/75 | HR 65 | Temp 98.0°F | Ht 73.0 in | Wt 295.0 lb

## 2019-10-28 DIAGNOSIS — R7303 Prediabetes: Secondary | ICD-10-CM | POA: Diagnosis not present

## 2019-10-28 DIAGNOSIS — E559 Vitamin D deficiency, unspecified: Secondary | ICD-10-CM

## 2019-10-28 DIAGNOSIS — Z6838 Body mass index (BMI) 38.0-38.9, adult: Secondary | ICD-10-CM

## 2019-10-28 MED ORDER — VITAMIN D (ERGOCALCIFEROL) 1.25 MG (50000 UNIT) PO CAPS: 50000.0000 [IU] | ORAL_CAPSULE | ORAL | 0 refills | Status: DC

## 2019-10-28 NOTE — Progress Notes (Signed)
Chief Complaint:   OBESITY [name] [name] is here to discuss his progress with his obesity treatment plan along with follow-up of his obesity related diagnoses. [name] [name] is on the Category 3 Plan and states he is following his eating plan approximately 95% of the time. [name] [name] states he is doing 0 minutes 0 times per week.  Today's visit was #: 18 Starting weight: 340 lbs Starting date: 12/28/2016 Today's weight: 295 lbs Today's date: 10/28/2019 Total lbs lost to date: 45 Total lbs lost since last in-office visit: 4  Interim History: [name] [name] is experiencing increased right posterior rib pain, chronic in the last 2 years. He reports pain as "spasm with electric shock". His pain worsens when he is bending over. He is awaiting physical therapy to call and schedule to begin formal therapy. He has been eating lunch late in the day, which has been making dinner a little difficult to get in.  Subjective:   1. Vitamin D deficiency [name] [name]'s last Vit D level was 57.1 on 08/14/2019. He is currently on prescription strength Vit D supplementation.  2. Pre-diabetes [name] [name]'s last A1c was 6.0 on 08/14/2019. He is currently on metformin 1,000 mg with breakfast. He reports fasting BGs range in 130's and he denies episodes of hypoglycemia.  Assessment/Plan:   1. Vitamin D deficiency Low Vitamin D level contributes to fatigue and are associated with obesity, breast, and colon cancer. We will refill prescription Vitamin D for 1 month. [name] [name] will follow-up for routine testing of Vitamin D, at least 2-3 times per year to avoid over-replacement.  - Vitamin D, Ergocalciferol, (DRISDOL) 1.25 MG (50000 UNIT) CAPS capsule; Take 1 capsule (50,000 Units total) by mouth every 14 (fourteen) days.  Dispense: 6 capsule; Refill: 0  2. Pre-diabetes [name] [name] will continue his low carbohydrate meal plan, and will continue to work on weight loss, exercise, and decreasing simple carbohydrates to help decrease the risk of diabetes.  [name] [name] agreed to continue metformin, and we will recheck labs in April 2021.  3. Class 2 severe obesity with serious comorbidity and body mass index (BMI) of 38.0 to 38.9 in adult, unspecified obesity type [name] [name]) [name] [name] is currently in the action stage of change. As such, his goal is to continue with weight loss efforts. He has agreed to following a lower carbohydrate, vegetable and lean protein rich diet plan.    Behavioral modification strategies: increasing lean protein intake, no skipping meals and meal planning and cooking strategies.  [name] [name] has agreed to follow-up with our clinic in 3 to 4 weeks. He was informed of the importance of frequent follow-up visits to maximize his success with intensive lifestyle modifications for his multiple health conditions.   Objective:   Blood pressure 121/75, pulse 65, temperature 98 F (36.7 C), temperature source Oral, height 6\' 1"  (1.854 m), weight 295 lb (133.8 kg), SpO2 96 %. Body mass index is 38.92 kg/m.  General: Cooperative, alert, well developed, in no acute distress. HEENT: Conjunctivae and lids unremarkable. Cardiovascular: Regular rhythm.  Lungs: Normal work of breathing. Neurologic: No focal deficits.   Lab Results  Component Value Date   CREATININE 1.20 08/14/2019   BUN 26 08/14/2019   NA 142 08/14/2019   K 3.8 08/14/2019   CL 103 08/14/2019   CO2 24 08/14/2019   Lab Results  Component Value Date   ALT 23 08/14/2019   AST 20 08/14/2019   ALKPHOS 64 08/14/2019   BILITOT 0.5 08/14/2019   Lab Results  Component Value Date   HGBA1C  6.0 (H) 08/14/2019   HGBA1C 6.0 (H) 03/25/2019   HGBA1C 6.2 12/17/2018   HGBA1C 6.9 (H) 09/24/2018   HGBA1C 6.4 (H) 05/03/2018   Lab Results  Component Value Date   INSULIN 10.0 08/14/2019   INSULIN 12.4 03/25/2019   INSULIN 19.2 09/24/2018   INSULIN 13.7 05/03/2018   INSULIN 15.7 07/19/2017   Lab Results  Component Value Date   TSH 1.500 03/25/2019   Lab Results  Component Value  Date   CHOL 195 08/14/2019   HDL 43 08/14/2019   LDLCALC 131 (H) 08/14/2019   LDLDIRECT 148 (H) 11/15/2007   TRIG 116 08/14/2019   CHOLHDL 5.2 (H) 01/26/2016   Lab Results  Component Value Date   WBC 7.3 11/30/2017   HGB 15.0 11/30/2017   HCT 46.8 11/30/2017   MCV 86 11/30/2017   PLT 293 11/30/2017   No results found for: IRON, TIBC, FERRITIN  Obesity Behavioral Intervention Documentation for Insurance:   Approximately 15 minutes were spent on the discussion below.  ASK: We discussed the diagnosis of obesity with [name] [name] today and [name] [name] agreed to give [name] permission to discuss obesity behavioral modification therapy today.  ASSESS: [name] [name] has the diagnosis of obesity and his BMI today is 38.93. [name] [name] is in the action stage of change.   ADVISE: [name] [name] was educated on the multiple health risks of obesity as well as the benefit of weight loss to improve his health. He was advised of the need for long term treatment and the importance of lifestyle modifications to improve his current health and to decrease his risk of future health problems.  AGREE: Multiple dietary modification options and treatment options were discussed and [name] [name] agreed to follow the recommendations documented in the above note.  ARRANGE: [name] was educated on the importance of frequent visits to treat obesity as outlined per CMS and USPSTF guidelines and agreed to schedule his next follow up appointment today.  Attestation Statements:   Reviewed by clinician on day of visit: allergies, medications, problem list, medical history, surgical history, family history, social history, and previous encounter notes.   I, Trixie Dredge, am acting as transcriptionist for [name], MD.  I have reviewed the above documentation for accuracy and completeness, and I agree with the above. -  [name], MD

## 2019-10-30 DIAGNOSIS — D1801 Hemangioma of skin and subcutaneous tissue: Secondary | ICD-10-CM | POA: Diagnosis not present

## 2019-10-30 DIAGNOSIS — L814 Other melanin hyperpigmentation: Secondary | ICD-10-CM | POA: Diagnosis not present

## 2019-10-30 DIAGNOSIS — D2362 Other benign neoplasm of skin of left upper limb, including shoulder: Secondary | ICD-10-CM | POA: Diagnosis not present

## 2019-10-30 DIAGNOSIS — D225 Melanocytic nevi of trunk: Secondary | ICD-10-CM | POA: Diagnosis not present

## 2019-10-30 DIAGNOSIS — L821 Other seborrheic keratosis: Secondary | ICD-10-CM | POA: Diagnosis not present

## 2019-10-30 DIAGNOSIS — Z85828 Personal history of other malignant neoplasm of skin: Secondary | ICD-10-CM | POA: Diagnosis not present

## 2019-10-30 DIAGNOSIS — D171 Benign lipomatous neoplasm of skin and subcutaneous tissue of trunk: Secondary | ICD-10-CM | POA: Diagnosis not present

## 2019-10-30 DIAGNOSIS — C4442 Squamous cell carcinoma of skin of scalp and neck: Secondary | ICD-10-CM | POA: Diagnosis not present

## 2019-11-07 ENCOUNTER — Other Ambulatory Visit: Payer: Self-pay

## 2019-11-07 DIAGNOSIS — E7849 Other hyperlipidemia: Secondary | ICD-10-CM

## 2019-11-07 MED ORDER — ROSUVASTATIN CALCIUM 40 MG PO TABS: ORAL_TABLET | ORAL | 0 refills | Status: DC

## 2019-11-11 ENCOUNTER — Other Ambulatory Visit: Payer: Self-pay

## 2019-11-11 DIAGNOSIS — E119 Type 2 diabetes mellitus without complications: Secondary | ICD-10-CM

## 2019-11-11 MED ORDER — METFORMIN HCL 1000 MG PO TABS: 1000.0000 mg | ORAL_TABLET | Freq: Every day | ORAL | 0 refills | Status: DC

## 2019-11-11 MED ORDER — ACCU-CHEK AVIVA PLUS VI STRP: 1.0000 | ORAL_STRIP | Freq: Two times a day (BID) | 0 refills | Status: DC

## 2019-11-13 ENCOUNTER — Ambulatory Visit: Payer: Medicare HMO | Attending: Family Medicine

## 2019-11-13 ENCOUNTER — Other Ambulatory Visit: Payer: Self-pay

## 2019-11-13 DIAGNOSIS — E119 Type 2 diabetes mellitus without complications: Secondary | ICD-10-CM | POA: Diagnosis not present

## 2019-11-13 DIAGNOSIS — R0600 Dyspnea, unspecified: Secondary | ICD-10-CM | POA: Diagnosis not present

## 2019-11-13 DIAGNOSIS — I10 Essential (primary) hypertension: Secondary | ICD-10-CM | POA: Insufficient documentation

## 2019-11-13 DIAGNOSIS — Z87828 Personal history of other (healed) physical injury and trauma: Secondary | ICD-10-CM | POA: Insufficient documentation

## 2019-11-13 DIAGNOSIS — M47814 Spondylosis without myelopathy or radiculopathy, thoracic region: Secondary | ICD-10-CM | POA: Insufficient documentation

## 2019-11-13 DIAGNOSIS — R0781 Pleurodynia: Secondary | ICD-10-CM | POA: Diagnosis not present

## 2019-11-13 DIAGNOSIS — E669 Obesity, unspecified: Secondary | ICD-10-CM | POA: Insufficient documentation

## 2019-11-13 NOTE — Patient Instructions (Signed)
XFM massage to R lower ribs for 5 mins, 3x daily. Door frame R trunk stretch 2-3 reps, every 2 hours. Cold pack to R ribs 15 mins every 2 hours as needed for pain.
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
## 2019-11-18 ENCOUNTER — Other Ambulatory Visit: Payer: Self-pay

## 2019-11-18 ENCOUNTER — Ambulatory Visit: Payer: Medicare HMO

## 2019-11-18 DIAGNOSIS — Z87828 Personal history of other (healed) physical injury and trauma: Secondary | ICD-10-CM | POA: Diagnosis not present

## 2019-11-18 DIAGNOSIS — M47814 Spondylosis without myelopathy or radiculopathy, thoracic region: Secondary | ICD-10-CM | POA: Diagnosis not present

## 2019-11-18 DIAGNOSIS — R0600 Dyspnea, unspecified: Secondary | ICD-10-CM | POA: Diagnosis not present

## 2019-11-18 DIAGNOSIS — E669 Obesity, unspecified: Secondary | ICD-10-CM | POA: Diagnosis not present

## 2019-11-18 DIAGNOSIS — I10 Essential (primary) hypertension: Secondary | ICD-10-CM | POA: Diagnosis not present

## 2019-11-18 DIAGNOSIS — E119 Type 2 diabetes mellitus without complications: Secondary | ICD-10-CM | POA: Diagnosis not present

## 2019-11-18 DIAGNOSIS — R0781 Pleurodynia: Secondary | ICD-10-CM

## 2019-11-18 NOTE — Patient Instructions (Signed)
Supine legs on chair or other support at 90/90 as able PPT and sacral lift with hamstrings engaged and deep breathing emphasizing exhale   5-6 reps 3-4 deep breathes
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
## 2019-11-20 DIAGNOSIS — N393 Stress incontinence (female) (male): Secondary | ICD-10-CM | POA: Diagnosis not present

## 2019-11-20 DIAGNOSIS — R350 Frequency of micturition: Secondary | ICD-10-CM | POA: Diagnosis not present

## 2019-11-21 ENCOUNTER — Ambulatory Visit: Payer: Medicare HMO

## 2019-11-25 ENCOUNTER — Ambulatory Visit: Payer: Medicare HMO

## 2019-11-25 ENCOUNTER — Encounter (INDEPENDENT_AMBULATORY_CARE_PROVIDER_SITE_OTHER): Payer: Self-pay | Admitting: Family Medicine

## 2019-11-25 ENCOUNTER — Other Ambulatory Visit: Payer: Self-pay

## 2019-11-25 ENCOUNTER — Ambulatory Visit (INDEPENDENT_AMBULATORY_CARE_PROVIDER_SITE_OTHER): Payer: Medicare HMO | Admitting: Family Medicine

## 2019-11-25 VITALS — BP 126/75 | HR 70 | Temp 98.0°F | Ht 73.0 in | Wt 292.0 lb

## 2019-11-25 DIAGNOSIS — M47814 Spondylosis without myelopathy or radiculopathy, thoracic region: Secondary | ICD-10-CM | POA: Diagnosis not present

## 2019-11-25 DIAGNOSIS — Z87828 Personal history of other (healed) physical injury and trauma: Secondary | ICD-10-CM | POA: Diagnosis not present

## 2019-11-25 DIAGNOSIS — E669 Obesity, unspecified: Secondary | ICD-10-CM | POA: Diagnosis not present

## 2019-11-25 DIAGNOSIS — E119 Type 2 diabetes mellitus without complications: Secondary | ICD-10-CM

## 2019-11-25 DIAGNOSIS — E559 Vitamin D deficiency, unspecified: Secondary | ICD-10-CM | POA: Diagnosis not present

## 2019-11-25 DIAGNOSIS — R0781 Pleurodynia: Secondary | ICD-10-CM

## 2019-11-25 DIAGNOSIS — Z6838 Body mass index (BMI) 38.0-38.9, adult: Secondary | ICD-10-CM

## 2019-11-25 DIAGNOSIS — I10 Essential (primary) hypertension: Secondary | ICD-10-CM | POA: Diagnosis not present

## 2019-11-25 DIAGNOSIS — R0600 Dyspnea, unspecified: Secondary | ICD-10-CM | POA: Diagnosis not present

## 2019-11-25 MED ORDER — VITAMIN D (ERGOCALCIFEROL) 1.25 MG (50000 UNIT) PO CAPS: 50000.0000 [IU] | ORAL_CAPSULE | ORAL | 0 refills | Status: DC
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
## 2019-11-26 DIAGNOSIS — G4733 Obstructive sleep apnea (adult) (pediatric): Secondary | ICD-10-CM | POA: Diagnosis not present

## 2019-11-27 ENCOUNTER — Ambulatory Visit: Payer: Medicare HMO

## 2019-11-27 ENCOUNTER — Other Ambulatory Visit: Payer: Self-pay

## 2019-11-27 DIAGNOSIS — E669 Obesity, unspecified: Secondary | ICD-10-CM | POA: Diagnosis not present

## 2019-11-27 DIAGNOSIS — Z87828 Personal history of other (healed) physical injury and trauma: Secondary | ICD-10-CM | POA: Diagnosis not present

## 2019-11-27 DIAGNOSIS — M47814 Spondylosis without myelopathy or radiculopathy, thoracic region: Secondary | ICD-10-CM | POA: Diagnosis not present

## 2019-11-27 DIAGNOSIS — R0600 Dyspnea, unspecified: Secondary | ICD-10-CM | POA: Diagnosis not present

## 2019-11-27 DIAGNOSIS — R0781 Pleurodynia: Secondary | ICD-10-CM

## 2019-11-27 DIAGNOSIS — I10 Essential (primary) hypertension: Secondary | ICD-10-CM | POA: Diagnosis not present

## 2019-11-27 DIAGNOSIS — E119 Type 2 diabetes mellitus without complications: Secondary | ICD-10-CM | POA: Diagnosis not present

## 2019-11-27 NOTE — Progress Notes (Signed)
Chief Complaint:   OBESITY [name] [name] is here to discuss his progress with his obesity treatment plan along with follow-up of his obesity related diagnoses. [name] [name] is on following a lower carbohydrate, vegetable and lean protein rich diet plan and states he is following his eating plan approximately 85% of the time. [name] [name] states he is walking for 15-20 minutes 2 times per week.  Today's visit was #: 83 Starting weight: 340 lbs Starting date: 12/28/2016 Today's weight: 292 lbs Today's date: 11/25/2019 Total lbs lost to date: 48 Total lbs lost since last in-office visit: 3  Interim History: [name] [name] continues to do very well with weight loss on his Low carbohydrate plan. He has had increased stress with increased PSA even after his prostatectomy approximately 6 years ago, but he has worked to not give in to too much emotional eating.  Subjective:   1. Vitamin D deficiency [name] [name] is stable on Vit D, but his level is not yet at goal. He denies nausea or vomiting. I discussed labs with the patient today.  2. Type 2 diabetes mellitus without complication, without long-term current use of insulin (HCC) [name] [name]'s fasting BGs mostly range between 130 and 140's on metformin. He has questions about the safety of metformin as he is getting emails saying it isn't safe.  Assessment/Plan:   1. Vitamin D deficiency Low Vitamin D level contributes to fatigue and are associated with obesity, breast, and colon cancer. We will refill prescription Vitamin D for 1 month. Shannan will follow-up for routine testing of Vitamin D, at least 2-3 times per year to avoid over-replacement. We will recheck labs in 3 months.  - Vitamin D, Ergocalciferol, (DRISDOL) 1.25 MG (50000 UNIT) CAPS capsule; Take 1 capsule (50,000 Units total) by mouth every 14 (fourteen) days.  Dispense: 6 capsule; Refill: 0  2. Type 2 diabetes mellitus without complication, without long-term current use of insulin ([place]) [name] [name] was educated  about metformin's safety and side effects, and he agreed to continue to take it as well as continue with weight loss. Good blood sugar control is important to decrease the likelihood of diabetic complications such as nephropathy, neuropathy, limb loss, blindness, coronary artery disease, and death. Intensive lifestyle modification including diet, exercise and weight loss are the first line of treatment for diabetes.   3. Class 2 severe obesity with serious comorbidity and body mass index (BMI) of 38.0 to 38.9 in adult, unspecified obesity type [name]) [name] [name] is currently in the action stage of change. As such, his goal is to continue with weight loss efforts. He has agreed to following a lower carbohydrate, vegetable and lean protein rich diet plan.   Exercise goals: As is.  Behavioral modification strategies: increasing lean protein intake, meal planning and cooking strategies and emotional eating strategies.  [name] [name] has agreed to follow-up with our clinic in 4 weeks. He was informed of the importance of frequent follow-up visits to maximize his success with intensive lifestyle modifications for his multiple health conditions.   Objective:   Blood pressure 126/75, pulse 70, temperature 98 F (36.7 C), temperature source Oral, height 6\' 1"  (1.854 m), weight 292 lb (132.5 kg), SpO2 97 %. Body mass index is 38.52 kg/m.  General: Cooperative, alert, well developed, in no acute distress. HEENT: Conjunctivae and lids unremarkable. Cardiovascular: Regular rhythm.  Lungs: Normal work of breathing. Neurologic: No focal deficits.   Lab Results  Component Value Date   CREATININE 1.20 08/14/2019   BUN 26 08/14/2019   NA 142 08/14/2019  K 3.8 08/14/2019   CL 103 08/14/2019   CO2 24 08/14/2019   Lab Results  Component Value Date   ALT 23 08/14/2019   AST 20 08/14/2019   ALKPHOS 64 08/14/2019   BILITOT 0.5 08/14/2019   Lab Results  Component Value Date   HGBA1C 6.0 (H) 08/14/2019   HGBA1C  6.0 (H) 03/25/2019   HGBA1C 6.2 12/17/2018   HGBA1C 6.9 (H) 09/24/2018   HGBA1C 6.4 (H) 05/03/2018   Lab Results  Component Value Date   INSULIN 10.0 08/14/2019   INSULIN 12.4 03/25/2019   INSULIN 19.2 09/24/2018   INSULIN 13.7 05/03/2018   INSULIN 15.7 07/19/2017   Lab Results  Component Value Date   TSH 1.500 03/25/2019   Lab Results  Component Value Date   CHOL 195 08/14/2019   HDL 43 08/14/2019   LDLCALC 131 (H) 08/14/2019   LDLDIRECT 148 (H) 11/15/2007   TRIG 116 08/14/2019   CHOLHDL 5.2 (H) 01/26/2016   Lab Results  Component Value Date   WBC 7.3 11/30/2017   HGB 15.0 11/30/2017   HCT 46.8 11/30/2017   MCV 86 11/30/2017   PLT 293 11/30/2017   No results found for: IRON, TIBC, FERRITIN  Obesity Behavioral Intervention Documentation for Insurance:   Approximately 15 minutes were spent on the discussion below.  ASK: We discussed the diagnosis of obesity with [name] today and Olyn agreed to give [name] permission to discuss obesity behavioral modification therapy today.  ASSESS: Pierre has the diagnosis of obesity and his BMI today is 38.53. Dare is in the action stage of change.   ADVISE: Oxford was educated on the multiple health risks of obesity as well as the benefit of weight loss to improve his health. He was advised of the need for long term treatment and the importance of lifestyle modifications to improve his current health and to decrease his risk of future health problems.  AGREE: Multiple dietary modification options and treatment options were discussed and Essa agreed to follow the recommendations documented in the above note.  ARRANGE: Zedrick was educated on the importance of frequent visits to treat obesity as outlined per CMS and USPSTF guidelines and agreed to schedule his next follow up appointment today.  Attestation Statements:   Reviewed by clinician on day of visit: allergies, medications, problem list, medical history, surgical  history, family history, social history, and previous encounter notes.   I, Trixie Dredge, am acting as transcriptionist for [name], MD.  I have reviewed the above documentation for accuracy and completeness, and I agree with the above. -  [name], MD
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
## 2019-11-28 ENCOUNTER — Encounter: Payer: Self-pay | Admitting: Family Medicine

## 2019-11-28 ENCOUNTER — Ambulatory Visit: Payer: Medicare HMO | Admitting: Family Medicine

## 2019-11-28 VITALS — BP 110/64 | HR 71 | Ht 73.0 in | Wt 301.0 lb

## 2019-11-28 DIAGNOSIS — Z0000 Encounter for general adult medical examination without abnormal findings: Secondary | ICD-10-CM | POA: Diagnosis not present

## 2019-11-28 DIAGNOSIS — E119 Type 2 diabetes mellitus without complications: Secondary | ICD-10-CM

## 2019-11-28 DIAGNOSIS — E7849 Other hyperlipidemia: Secondary | ICD-10-CM

## 2019-11-28 LAB — POCT GLYCOSYLATED HEMOGLOBIN (HGB A1C): HbA1c, POC (controlled diabetic range): 6.1 % (ref 0.0–7.0)

## 2019-11-28 MED ORDER — HYDROCHLOROTHIAZIDE 12.5 MG PO TABS: ORAL_TABLET | ORAL | 2 refills | Status: DC

## 2019-11-28 MED ORDER — FUROSEMIDE 40 MG PO TABS: 40.0000 mg | ORAL_TABLET | Freq: Every day | ORAL | 2 refills | Status: DC

## 2019-11-28 MED ORDER — BUPROPION HCL ER (XL) 150 MG PO TB24: 150.0000 mg | ORAL_TABLET | Freq: Every day | ORAL | 2 refills | Status: DC

## 2019-11-28 MED ORDER — METFORMIN HCL 1000 MG PO TABS: 1000.0000 mg | ORAL_TABLET | Freq: Every day | ORAL | 2 refills | Status: DC

## 2019-11-28 MED ORDER — MIRABEGRON ER 50 MG PO TB24: 50.0000 mg | ORAL_TABLET | Freq: Every day | ORAL | 0 refills | Status: DC

## 2019-11-28 MED ORDER — LOSARTAN POTASSIUM 50 MG PO TABS: ORAL_TABLET | ORAL | 2 refills | Status: DC

## 2019-11-28 MED ORDER — METOPROLOL SUCCINATE ER 25 MG PO TB24: 25.0000 mg | ORAL_TABLET | Freq: Every day | ORAL | 2 refills | Status: DC

## 2019-11-28 MED ORDER — ROSUVASTATIN CALCIUM 40 MG PO TABS: ORAL_TABLET | ORAL | 2 refills | Status: DC

## 2019-11-28 NOTE — Assessment & Plan Note (Addendum)
Patient sees healthy weight and wellness clinic.  They checked his hemoglobin A1c in January which was 6.0.  Hemoglobin A1c today was 6.1. -Diabetic foot exam completed today -Continue diabetes management as prescribed -Continue management by healthy weight and wellness clinic

## 2019-11-28 NOTE — Assessment & Plan Note (Signed)
Patient was evaluated for annual health care maintenance.  Hemoglobin A1c was 6.9 from 6.0.  All of his medications were reordered for 90 days with 2 refills.  Patient is following with urology regarding PSA. -Follow-up in 3 months for diabetes check

## 2019-11-28 NOTE — Progress Notes (Signed)
    SUBJECTIVE:   CHIEF COMPLAINT / HPI:   Patient is here for a prescription refill because he has not been seen in a year.  Prescriptions were refilled for 3 months prescriptions with 2 refills on each.  Diabetes Patient reports that sugars have been well controlled.  He is seeing weight loss clinic are also monitoring his glucoses.  Hemoglobin A1c in January was 6.0.  Hemoglobin A1c today was 6.1.  No concerns regarding his diabetes management.  PERTINENT  PMH / PSH: Diabetes, hyperlipidemia, hypertension, obesity  OBJECTIVE:   BP 110/64   Pulse 71   Ht 6\' 1"  (1.854 m)   Wt (!) 301 lb (136.5 kg)   SpO2 97%   BMI 39.71 kg/m   General: Well-appearing, no acute distress Respiratory: Clear to auscultation bilaterally, normal work of breathing, no wheezes noted Cardio: Regular rate and rhythm, no murmurs appreciated Abdomen: Positive bowel sounds Diabetic Foot Exam - Simple   Simple Foot Form Diabetic Foot exam was performed with the following findings: Yes 11/28/2019  9:38 AM  Visual Inspection No deformities, no ulcerations, no other skin breakdown bilaterally: Yes Sensation Testing Intact to touch and monofilament testing bilaterally: Yes Pulse Check Posterior Tibialis and Dorsalis pulse intact bilaterally: Yes Comments     ASSESSMENT/PLAN:   Healthcare maintenance Patient was evaluated for annual health care maintenance.  Hemoglobin A1c was 6.9 from 6.0.  All of his medications were reordered for 90 days with 2 refills.  Patient is following with urology regarding PSA. -Follow-up in 3 months for diabetes check  Type 2 diabetes mellitus without complication, without long-term current use of insulin ([place]) Patient sees healthy weight and wellness clinic.  They checked his hemoglobin A1c in January which was 6.0.  Hemoglobin A1c today was 6.1. -Diabetic foot exam completed today -Continue diabetes management as prescribed -Continue management by healthy weight and  wellness clinic     Gifford Shave, MD [place]

## 2019-11-28 NOTE — Patient Instructions (Signed)
It was wonderful to see you today.  I sent in prescriptions for all of your medications with a few refills on each.  Your hemoglobin A1c today was 6.1 which is mildly up from 6.0 in January.  If you have any questions or concerns please feel free to call the clinic and schedule an appointment we will see you soon as possible.  Have a wonderful day!

## 2019-11-29 DIAGNOSIS — R350 Frequency of micturition: Secondary | ICD-10-CM | POA: Diagnosis not present

## 2019-11-29 DIAGNOSIS — N3281 Overactive bladder: Secondary | ICD-10-CM | POA: Diagnosis not present

## 2019-12-02 ENCOUNTER — Other Ambulatory Visit: Payer: Self-pay

## 2019-12-02 ENCOUNTER — Ambulatory Visit: Payer: Medicare HMO | Attending: Family Medicine

## 2019-12-02 DIAGNOSIS — M47814 Spondylosis without myelopathy or radiculopathy, thoracic region: Secondary | ICD-10-CM | POA: Insufficient documentation

## 2019-12-02 DIAGNOSIS — R0781 Pleurodynia: Secondary | ICD-10-CM | POA: Insufficient documentation
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
## 2019-12-04 ENCOUNTER — Ambulatory Visit: Payer: Medicare HMO

## 2019-12-04 ENCOUNTER — Other Ambulatory Visit: Payer: Self-pay

## 2019-12-04 DIAGNOSIS — M47814 Spondylosis without myelopathy or radiculopathy, thoracic region: Secondary | ICD-10-CM | POA: Diagnosis not present

## 2019-12-04 DIAGNOSIS — R0781 Pleurodynia: Secondary | ICD-10-CM | POA: Diagnosis not present
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
## 2019-12-11 ENCOUNTER — Ambulatory Visit: Payer: Medicare HMO

## 2019-12-11 ENCOUNTER — Other Ambulatory Visit: Payer: Self-pay

## 2019-12-11 DIAGNOSIS — R0781 Pleurodynia: Secondary | ICD-10-CM | POA: Diagnosis not present

## 2019-12-11 DIAGNOSIS — M47814 Spondylosis without myelopathy or radiculopathy, thoracic region: Secondary | ICD-10-CM | POA: Diagnosis not present
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
## 2019-12-13 ENCOUNTER — Ambulatory Visit: Payer: Medicare HMO

## 2019-12-16 ENCOUNTER — Other Ambulatory Visit: Payer: Self-pay

## 2019-12-16 ENCOUNTER — Ambulatory Visit: Payer: Medicare HMO

## 2019-12-16 DIAGNOSIS — R0781 Pleurodynia: Secondary | ICD-10-CM | POA: Diagnosis not present

## 2019-12-16 DIAGNOSIS — M47814 Spondylosis without myelopathy or radiculopathy, thoracic region: Secondary | ICD-10-CM | POA: Diagnosis not present
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
## 2019-12-23 ENCOUNTER — Encounter (INDEPENDENT_AMBULATORY_CARE_PROVIDER_SITE_OTHER): Payer: Self-pay | Admitting: Family Medicine

## 2019-12-23 ENCOUNTER — Other Ambulatory Visit (INDEPENDENT_AMBULATORY_CARE_PROVIDER_SITE_OTHER): Payer: Self-pay | Admitting: Family Medicine

## 2019-12-23 ENCOUNTER — Ambulatory Visit (INDEPENDENT_AMBULATORY_CARE_PROVIDER_SITE_OTHER): Payer: Medicare HMO | Admitting: Family Medicine

## 2019-12-23 ENCOUNTER — Other Ambulatory Visit: Payer: Self-pay

## 2019-12-23 VITALS — BP 118/73 | HR 70 | Temp 97.9°F | Ht 73.0 in | Wt 293.0 lb

## 2019-12-23 DIAGNOSIS — Z6838 Body mass index (BMI) 38.0-38.9, adult: Secondary | ICD-10-CM

## 2019-12-23 DIAGNOSIS — F3289 Other specified depressive episodes: Secondary | ICD-10-CM

## 2019-12-23 MED ORDER — BUPROPION HCL ER (SR) 200 MG PO TB12: 200.0000 mg | ORAL_TABLET | Freq: Every morning | ORAL | 0 refills | Status: DC

## 2019-12-23 NOTE — Progress Notes (Signed)
Chief Complaint:   OBESITY [name] [name] is here to discuss his progress with his obesity treatment plan along with follow-up of his obesity related diagnoses. [name] [name] is on following a lower carbohydrate, vegetable and lean protein rich diet plan and states he is following his eating plan approximately 90% of the time. [name] [name] states he is walking and doing yard work for 30 minutes 7 times per week.  Today's visit was #: 16 Starting weight: 340 lbs Starting date: 12/28/2016 Today's weight: 293 lbs Today's date: 12/23/2019 Total lbs lost to date: 47 Total lbs lost since last in-office visit: 0  Interim History: [name] [name] has done well maintaining his weight since our last visit. He is retaining a bit of fluid today. He is gardening at his home and his sister's home for activity, and he is still fishing and riding his bike for entertainment.  Subjective:   1. Other depression with emotional eating [name] [name] notes stress at work and with his health has increased, and he is feeling frustrated and isolated. He has been on Wellbutrin XL for >1 year. He is trying to avoid emotional eating.  Assessment/Plan:   1. Other depression with emotional eating Behavior modification techniques were discussed today to help [name] [name] deal with his emotional/non-hunger eating behaviors. [name] [name] agreed to discontinue Wellbutrin XL and he will start Wellbutrin SR 200 mg daily with no refills. Orders and follow up as documented in patient record.   - buPROPion (WELLBUTRIN SR) 200 MG 12 hr tablet; Take 1 tablet (200 mg total) by mouth every morning.  Dispense: 90 tablet; Refill: 0  2. Class 2 severe obesity with serious comorbidity and body mass index (BMI) of 38.0 to 38.9 in adult, unspecified obesity type [facility]) [name] [name] is currently in the action stage of change. As such, his goal is to continue with weight loss efforts. He has agreed to following a lower carbohydrate, vegetable and lean protein rich diet plan.   Exercise  goals: As is.  Behavioral modification strategies: emotional eating strategies.  [name] [name] has agreed to follow-up with our clinic in 3 to 4 weeks. He was informed of the importance of frequent follow-up visits to maximize his success with intensive lifestyle modifications for his multiple health conditions.   Objective:   Blood pressure 118/73, pulse 70, temperature 97.9 F (36.6 C), temperature source Oral, height 6\' 1"  (1.854 m), weight 293 lb (132.9 kg), SpO2 96 %. Body mass index is 38.66 kg/m.  General: Cooperative, alert, well developed, in no acute distress. HEENT: Conjunctivae and lids unremarkable. Cardiovascular: Regular rhythm.  Lungs: Normal work of breathing. Neurologic: No focal deficits.   Lab Results  Component Value Date   CREATININE 1.20 08/14/2019   BUN 26 08/14/2019   NA 142 08/14/2019   K 3.8 08/14/2019   CL 103 08/14/2019   CO2 24 08/14/2019   Lab Results  Component Value Date   ALT 23 08/14/2019   AST 20 08/14/2019   ALKPHOS 64 08/14/2019   BILITOT 0.5 08/14/2019   Lab Results  Component Value Date   HGBA1C 6.1 11/28/2019   HGBA1C 6.0 (H) 08/14/2019   HGBA1C 6.0 (H) 03/25/2019   HGBA1C 6.2 12/17/2018   HGBA1C 6.9 (H) 09/24/2018   Lab Results  Component Value Date   INSULIN 10.0 08/14/2019   INSULIN 12.4 03/25/2019   INSULIN 19.2 09/24/2018   INSULIN 13.7 05/03/2018   INSULIN 15.7 07/19/2017   Lab Results  Component Value Date   TSH 1.500 03/25/2019   Lab Results  Component Value Date   CHOL 195 08/14/2019   HDL 43 08/14/2019   LDLCALC 131 (H) 08/14/2019   LDLDIRECT 148 (H) 11/15/2007   TRIG 116 08/14/2019   CHOLHDL 5.2 (H) 01/26/2016   Lab Results  Component Value Date   WBC 7.3 11/30/2017   HGB 15.0 11/30/2017   HCT 46.8 11/30/2017   MCV 86 11/30/2017   PLT 293 11/30/2017   No results found for: IRON, TIBC, FERRITIN  Attestation Statements:   Reviewed by clinician on day of visit: allergies, medications, problem list,  medical history, surgical history, family history, social history, and previous encounter notes.  Time spent on visit including pre-visit chart review and post-visit care and charting was 30 minutes.    I, Trixie Dredge, am acting as transcriptionist for [name], MD.  I have reviewed the above documentation for accuracy and completeness, and I agree with the above. -  [name], MD

## 2019-12-25 ENCOUNTER — Ambulatory Visit: Payer: Medicare HMO

## 2020-01-09 DIAGNOSIS — N393 Stress incontinence (female) (male): Secondary | ICD-10-CM | POA: Diagnosis not present

## 2020-01-09 DIAGNOSIS — N3281 Overactive bladder: Secondary | ICD-10-CM | POA: Diagnosis not present

## 2020-01-20 ENCOUNTER — Encounter (INDEPENDENT_AMBULATORY_CARE_PROVIDER_SITE_OTHER): Payer: Self-pay | Admitting: Family Medicine

## 2020-01-20 ENCOUNTER — Other Ambulatory Visit: Payer: Self-pay

## 2020-01-20 ENCOUNTER — Ambulatory Visit (INDEPENDENT_AMBULATORY_CARE_PROVIDER_SITE_OTHER): Payer: Medicare HMO | Admitting: Family Medicine

## 2020-01-20 VITALS — BP 131/69 | HR 69 | Temp 97.9°F | Ht 73.0 in | Wt 293.0 lb

## 2020-01-20 DIAGNOSIS — E119 Type 2 diabetes mellitus without complications: Secondary | ICD-10-CM

## 2020-01-20 DIAGNOSIS — Z6838 Body mass index (BMI) 38.0-38.9, adult: Secondary | ICD-10-CM

## 2020-01-20 DIAGNOSIS — F3289 Other specified depressive episodes: Secondary | ICD-10-CM | POA: Diagnosis not present

## 2020-01-20 DIAGNOSIS — E559 Vitamin D deficiency, unspecified: Secondary | ICD-10-CM

## 2020-01-20 MED ORDER — BUPROPION HCL ER (SR) 200 MG PO TB12: 200.0000 mg | ORAL_TABLET | Freq: Every morning | ORAL | 0 refills | Status: DC

## 2020-01-20 MED ORDER — VITAMIN D (ERGOCALCIFEROL) 1.25 MG (50000 UNIT) PO CAPS: 50000.0000 [IU] | ORAL_CAPSULE | ORAL | 0 refills | Status: DC

## 2020-01-22 NOTE — Progress Notes (Signed)
Chief Complaint:   OBESITY [name] [name] is here to discuss his progress with his obesity treatment plan along with follow-up of his obesity related diagnoses. [name] [name] is on following a lower carbohydrate, vegetable and lean protein rich diet plan and states he is following his eating plan approximately 80-85% of the time. [name] [name] states he is walking for 15 minutes 5-6 times per week.  Today's visit was #: 1 Starting weight: 340 lbs Starting date: 12/28/2016 Today's weight: 293 lbs Today's date: 01/20/2020 Total lbs lost to date: 47 Total lbs lost since last in-office visit: 0  Interim History: [name] [name] has done very well maintaining his weight in the last month which has been especially difficult with increased social eating situations.  Subjective:   1. Vitamin D deficiency [name] [name] is stable on Vit D and he requests a refill today.  2. Type 2 diabetes mellitus without complication, without long-term current use of insulin (HCC) [name] [name]'s A1c was very well controlled at 6.1 with diet and metformin. He denies hypoglycemia. I discussed labs with the patient today.  3. Other depression with emotional eating [name] [name] is stable on Wellbutrin but he still notes feeling lonely. He denies suicidal ideas or homicidal ideas. He notes he is a social person and although he socializes at least weekly, he still struggles.  Assessment/Plan:   1. Vitamin D deficiency Low Vitamin D level contributes to fatigue and are associated with obesity, breast, and colon cancer. We will refill prescription Vitamin D for 90 days with no refills. [name] [name] will follow-up for routine testing of Vitamin D, at least 2-3 times per year to avoid over-replacement.  - Vitamin D, Ergocalciferol, (DRISDOL) 1.25 MG (50000 UNIT) CAPS capsule; Take 1 capsule (50,000 Units total) by mouth every 14 (fourteen) days.  Dispense: 6 capsule; Refill: 0  2. Type 2 diabetes mellitus without complication, without long-term current use of  insulin (HCC) Good blood sugar control is important to decrease the likelihood of diabetic complications such as nephropathy, neuropathy, limb loss, blindness, coronary artery disease, and death. Intensive lifestyle modification including diet, exercise and weight loss are the first line of treatment for diabetes. [name] [name] will continue with diet and exercise, and will follow up.  3. Other depression with emotional eating Emotional eating strategies were discussed today to help [name] [name] deal with his emotional/non-hunger eating behaviors. We will refill Wellbutrin SR for 90 days with no refills. Orders and follow up as documented in patient record.   - buPROPion (WELLBUTRIN SR) 200 MG 12 hr tablet; Take 1 tablet (200 mg total) by mouth every morning.  Dispense: 90 tablet; Refill: 0  4. Class 2 severe obesity with serious comorbidity and body mass index (BMI) of 38.0 to 38.9 in adult, unspecified obesity type [name] [name]) [name] [name] is currently in the action stage of change. As such, his goal is to continue with weight loss efforts. He has agreed to following a lower carbohydrate, vegetable and lean protein rich diet plan.   Exercise goals: As is.  Behavioral modification strategies: emotional eating strategies.  [name] [name] has agreed to follow-up with our clinic in 3 to 4 weeks. He was informed of the importance of frequent follow-up visits to maximize his success with intensive lifestyle modifications for his multiple health conditions.   Objective:   Blood pressure 131/69, pulse 69, temperature 97.9 F (36.6 C), temperature source Oral, height 6\' 1"  (1.854 m), weight 293 lb (132.9 kg), SpO2 96 %. Body mass index is 38.66 kg/m.  General: Cooperative, alert, well developed, in  no acute distress. HEENT: Conjunctivae and lids unremarkable. Cardiovascular: Regular rhythm.  Lungs: Normal work of breathing. Neurologic: No focal deficits.   Lab Results  Component Value Date   CREATININE 1.20 08/14/2019    BUN 26 08/14/2019   NA 142 08/14/2019   K 3.8 08/14/2019   CL 103 08/14/2019   CO2 24 08/14/2019   Lab Results  Component Value Date   ALT 23 08/14/2019   AST 20 08/14/2019   ALKPHOS 64 08/14/2019   BILITOT 0.5 08/14/2019   Lab Results  Component Value Date   HGBA1C 6.1 11/28/2019   HGBA1C 6.0 (H) 08/14/2019   HGBA1C 6.0 (H) 03/25/2019   HGBA1C 6.2 12/17/2018   HGBA1C 6.9 (H) 09/24/2018   Lab Results  Component Value Date   INSULIN 10.0 08/14/2019   INSULIN 12.4 03/25/2019   INSULIN 19.2 09/24/2018   INSULIN 13.7 05/03/2018   INSULIN 15.7 07/19/2017   Lab Results  Component Value Date   TSH 1.500 03/25/2019   Lab Results  Component Value Date   CHOL 195 08/14/2019   HDL 43 08/14/2019   LDLCALC 131 (H) 08/14/2019   LDLDIRECT 148 (H) 11/15/2007   TRIG 116 08/14/2019   CHOLHDL 5.2 (H) 01/26/2016   Lab Results  Component Value Date   WBC 7.3 11/30/2017   HGB 15.0 11/30/2017   HCT 46.8 11/30/2017   MCV 86 11/30/2017   PLT 293 11/30/2017   No results found for: IRON, TIBC, FERRITIN  Obesity Behavioral Intervention Documentation for Insurance:   Approximately 15 minutes were spent on the discussion below.  ASK: We discussed the diagnosis of obesity with [name] [name] today and [name] [name] agreed to give [name] permission to discuss obesity behavioral modification therapy today.  ASSESS: [name] [name] has the diagnosis of obesity and his BMI today is 38.67. [name] [name] is in the action stage of change.   ADVISE: [name] [name] was educated on the multiple health risks of obesity as well as the benefit of weight loss to improve his health. He was advised of the need for long term treatment and the importance of lifestyle modifications to improve his current health and to decrease his risk of future health problems.  AGREE: Multiple dietary modification options and treatment options were discussed and [name] [name] agreed to follow the recommendations documented in the above note.  ARRANGE: [name] [name]  was educated on the importance of frequent visits to treat obesity as outlined per CMS and USPSTF guidelines and agreed to schedule his next follow up appointment today.  Attestation Statements:   Reviewed by clinician on day of visit: allergies, medications, problem list, medical history, surgical history, family history, social history, and previous encounter notes.   I, Trixie Dredge, am acting as transcriptionist for [name], MD.  I have reviewed the above documentation for accuracy and completeness, and I agree with the above. -  [name], MD

## 2020-02-04 ENCOUNTER — Encounter (INDEPENDENT_AMBULATORY_CARE_PROVIDER_SITE_OTHER): Payer: Self-pay | Admitting: Family Medicine

## 2020-02-04 NOTE — Telephone Encounter (Signed)
Please advise 

## 2020-02-18 ENCOUNTER — Ambulatory Visit (INDEPENDENT_AMBULATORY_CARE_PROVIDER_SITE_OTHER): Payer: Medicare HMO | Admitting: Family Medicine

## 2020-02-27 ENCOUNTER — Encounter (INDEPENDENT_AMBULATORY_CARE_PROVIDER_SITE_OTHER): Payer: Self-pay | Admitting: Family Medicine

## 2020-02-27 ENCOUNTER — Ambulatory Visit (INDEPENDENT_AMBULATORY_CARE_PROVIDER_SITE_OTHER): Payer: Medicare HMO | Admitting: Family Medicine

## 2020-02-27 ENCOUNTER — Other Ambulatory Visit: Payer: Self-pay

## 2020-02-27 VITALS — BP 110/73 | HR 100 | Temp 98.1°F | Ht 73.0 in | Wt 287.0 lb

## 2020-02-27 DIAGNOSIS — E559 Vitamin D deficiency, unspecified: Secondary | ICD-10-CM | POA: Diagnosis not present

## 2020-02-27 DIAGNOSIS — Z6837 Body mass index (BMI) 37.0-37.9, adult: Secondary | ICD-10-CM

## 2020-02-27 DIAGNOSIS — F3289 Other specified depressive episodes: Secondary | ICD-10-CM

## 2020-02-27 MED ORDER — BUPROPION HCL ER (SR) 200 MG PO TB12: 200.0000 mg | ORAL_TABLET | Freq: Every morning | ORAL | 0 refills | Status: DC

## 2020-02-27 NOTE — Progress Notes (Signed)
Chief Complaint:   OBESITY [name] [name] is here to discuss his progress with his obesity treatment plan along with follow-up of his obesity related diagnoses. [name] [name] is on following a lower carbohydrate, vegetable and lean protein rich diet plan and states he is following his eating plan approximately 90% of the time. [name] [name] states he is doing yard work and [place].  Today's [name] was #: 64 Starting weight: 340 lbs Starting date: 12/28/2016 Today's weight: 287 lbs Today's date: 02/27/2020 Total lbs lost to date: 53 Total lbs lost since last in-office [name]: 6  Interim History: [name] [name] has done well with weight loss, even with increased traveling. His hunger has been controlled and his activity has increased recently with extra yard chores. He is happy with his Low carbohydrate plan and would like to continue this.  Subjective:   1. Vitamin D deficiency [name] [name] is on Vit D every 2 weeks, and he is due for labs. He denies nausea or vomiting.  2. Other depression with emotional eating [name] [name] is stable on his medications, and his blood pressure is controlled. He denies palpitations.  Assessment/Plan:   1. Vitamin D deficiency Low Vitamin D level contributes to fatigue and are associated with obesity, breast, and colon cancer. We will check labs today. [name] [name] agreed to continue taking prescription Vitamin D 50,000 IU every 14 days and will follow-up for routine testing of Vitamin D, at least 2-3 times per year to avoid over-replacement.  - VITAMIN D 25 Hydroxy (Vit-D Deficiency, Fractures)  2. Other depression with emotional eating Behavior modification techniques were discussed today to help [name] [name] deal with his emotional/non-hunger eating behaviors. We will refill [name] [name] for 90 days with no refills. Orders and follow up as documented in patient record.   - buPROPion ([name] [name]) 200 MG 12 hr tablet; Take 1 tablet (200 mg total) by mouth every morning.  Dispense: 90  tablet; Refill: 0  3. Class 2 severe obesity with serious comorbidity and body mass index (BMI) of 37.0 to 37.9 in adult, unspecified obesity type [name] [name]) [name] [name] is currently in the action stage of change. As such, his goal is to continue with weight loss efforts. He has agreed to following a lower carbohydrate, vegetable and lean protein rich diet plan.   Exercise goals: As is.  Behavioral modification strategies: increasing lean protein intake, increasing vegetables and meal planning and cooking strategies.  [name] [name] has agreed to follow-up with our clinic in 4 weeks. He was informed of the importance of frequent follow-up visits to maximize his success with intensive lifestyle modifications for his multiple health conditions.   [name] [name] was informed we would discuss his lab results at his next [name] unless there is a critical issue that needs to be addressed sooner. [name] [name] at the agreed upon time to discuss these results.  Objective:   Blood pressure 110/73, pulse 100, temperature 98.1 F (36.7 C), temperature source Oral, height 6\' 1"  (1.854 m), weight (!) 287 lb (130.2 kg), SpO2 95 %. Body mass index is 37.87 kg/m.  General: Cooperative, alert, well developed, in no acute distress. HEENT: Conjunctivae and lids unremarkable. Cardiovascular: Regular rhythm.  Lungs: Normal work of breathing. Neurologic: No focal deficits.   Lab Results  Component Value Date   CREATININE 1.20 08/14/2019   BUN 26 08/14/2019   NA 142 08/14/2019   K 3.8 08/14/2019   CL 103 08/14/2019   CO2 24 08/14/2019   Lab Results  Component Value Date  ALT 23 08/14/2019   AST 20 08/14/2019   ALKPHOS 64 08/14/2019   BILITOT 0.5 08/14/2019   Lab Results  Component Value Date   HGBA1C 6.1 11/28/2019   HGBA1C 6.0 (H) 08/14/2019   HGBA1C 6.0 (H) 03/25/2019   HGBA1C 6.2 12/17/2018   HGBA1C 6.9 (H) 09/24/2018   Lab Results  Component Value Date   INSULIN 10.0 08/14/2019    INSULIN 12.4 03/25/2019   INSULIN 19.2 09/24/2018   INSULIN 13.7 05/03/2018   INSULIN 15.7 07/19/2017   Lab Results  Component Value Date   TSH 1.500 03/25/2019   Lab Results  Component Value Date   CHOL 195 08/14/2019   HDL 43 08/14/2019   LDLCALC 131 (H) 08/14/2019   LDLDIRECT 148 (H) 11/15/2007   TRIG 116 08/14/2019   CHOLHDL 5.2 (H) 01/26/2016   Lab Results  Component Value Date   WBC 7.3 11/30/2017   HGB 15.0 11/30/2017   HCT 46.8 11/30/2017   MCV 86 11/30/2017   PLT 293 11/30/2017   No results found for: IRON, TIBC, FERRITIN  Obesity Behavioral Intervention Documentation for Insurance:   Approximately 15 minutes were spent on the discussion below.  ASK: We discussed the diagnosis of obesity with [name] [name] today and [name] [name] agreed to give [name] permission to discuss obesity behavioral modification therapy today.  ASSESS: [name] [name] has the diagnosis of obesity and his BMI today is 37.87. [name] is in the action stage of change.   ADVISE: [name] [name] was educated on the multiple health risks of obesity as well as the benefit of weight loss to improve his health. He was advised of the need for long term treatment and the importance of lifestyle modifications to improve his current health and to decrease his risk of [name] [name] health problems.  AGREE: Multiple dietary modification options and treatment options were discussed and [name] [name] agreed to follow the recommendations documented in the above note.  ARRANGE: [name] [name] was educated on the importance of frequent visits to treat obesity as outlined per CMS and USPSTF guidelines and agreed to schedule his next follow up appointment today.  Attestation Statements:   Reviewed by clinician on day of [name]: allergies, medications, problem list, medical history, surgical history, family history, social history, and previous encounter notes.   I, Trixie Dredge, am acting as transcriptionist for [name], MD.  I have reviewed the above  documentation for accuracy and completeness, and I agree with the above. -  [name], MD

## 2020-02-28 LAB — VITAMIN D 25 HYDROXY (VIT D DEFICIENCY, FRACTURES): Vit D, 25-Hydroxy: 58.3 ng/mL (ref 30.0–100.0)

## 2020-03-18 DIAGNOSIS — Z8546 Personal history of malignant neoplasm of prostate: Secondary | ICD-10-CM | POA: Diagnosis not present

## 2020-03-18 DIAGNOSIS — N5201 Erectile dysfunction due to arterial insufficiency: Secondary | ICD-10-CM | POA: Diagnosis not present

## 2020-03-18 DIAGNOSIS — N393 Stress incontinence (female) (male): Secondary | ICD-10-CM | POA: Diagnosis not present

## 2020-03-25 ENCOUNTER — Ambulatory Visit (INDEPENDENT_AMBULATORY_CARE_PROVIDER_SITE_OTHER): Payer: Medicare HMO | Admitting: Family Medicine

## 2020-03-27 ENCOUNTER — Other Ambulatory Visit: Payer: Self-pay

## 2020-03-27 MED ORDER — ACCU-CHEK AVIVA PLUS VI STRP: 1.0000 | ORAL_STRIP | Freq: Two times a day (BID) | 0 refills | Status: DC

## 2020-04-07 ENCOUNTER — Ambulatory Visit (INDEPENDENT_AMBULATORY_CARE_PROVIDER_SITE_OTHER): Payer: Medicare HMO | Admitting: Family Medicine

## 2020-04-08 ENCOUNTER — Ambulatory Visit (INDEPENDENT_AMBULATORY_CARE_PROVIDER_SITE_OTHER): Payer: Medicare HMO | Admitting: Family Medicine

## 2020-04-08 ENCOUNTER — Encounter (INDEPENDENT_AMBULATORY_CARE_PROVIDER_SITE_OTHER): Payer: Self-pay | Admitting: Family Medicine

## 2020-04-08 ENCOUNTER — Other Ambulatory Visit: Payer: Self-pay

## 2020-04-08 VITALS — BP 134/68 | HR 50 | Temp 98.0°F | Ht 73.0 in | Wt 295.0 lb

## 2020-04-08 DIAGNOSIS — Z6839 Body mass index (BMI) 39.0-39.9, adult: Secondary | ICD-10-CM

## 2020-04-08 DIAGNOSIS — F3289 Other specified depressive episodes: Secondary | ICD-10-CM

## 2020-04-08 DIAGNOSIS — E559 Vitamin D deficiency, unspecified: Secondary | ICD-10-CM | POA: Diagnosis not present

## 2020-04-09 MED ORDER — VITAMIN D (ERGOCALCIFEROL) 1.25 MG (50000 UNIT) PO CAPS: 50000.0000 [IU] | ORAL_CAPSULE | ORAL | 0 refills | Status: DC

## 2020-04-09 MED ORDER — ESCITALOPRAM OXALATE 10 MG PO TABS: 10.0000 mg | ORAL_TABLET | Freq: Every day | ORAL | 0 refills | Status: DC

## 2020-04-09 NOTE — Progress Notes (Signed)
Chief Complaint:   OBESITY [name] [name] is here to discuss his progress with his obesity treatment plan along with follow-up of his obesity related diagnoses. [name] [name] is on following a lower carbohydrate, vegetable and lean protein rich diet plan and states he is following his eating plan approximately 75-80% of the time. [name] [name] states he is walking for 20-25 minutes 4 times per week.  Today's visit was #: 57 Starting weight: 340 lbs Starting date: 12/28/2016 Today's weight: 295 lbs Today's date: 04/08/2020 Total lbs lost to date: 45 Total lbs lost since last in-office visit: 0  Interim History: [name] [name] hasn't been able to concentrate on weight loss as much in the last month. His mood is low and he is not getting much support from friends, church members, and family.  Subjective:   1. Vitamin D deficiency [name] [name] is stable on Vit D, and he requests a refill today.  2. Other depression with emotional eating [name] [name]'s mood has worsened and he is feeling isolated and unappreciated. His support system is severely limited and his family is not there for him.  Assessment/Plan:   1. Vitamin D deficiency Low Vitamin D level contributes to fatigue and are associated with obesity, breast, and colon cancer. We will refill prescription Vitamin D for 1 month. [name] [name] will follow-up for routine testing of Vitamin D, at least 2-3 times per year to avoid over-replacement.  - Vitamin D, Ergocalciferol, (DRISDOL) 1.25 MG (50000 UNIT) CAPS capsule; Take 1 capsule (50,000 Units total) by mouth every 14 (fourteen) days.  Dispense: 6 capsule; Refill: 0  2. Other depression with emotional eating Behavior modification techniques were discussed today to help [name] [name] deal with his emotional/non-hunger eating behaviors. [name] [name] agreed to start [name] [name] 10 mg q daily with no refills, and he will continue [name] [name]. We discussed ways to help him look for a better support system and will follow closely. Orders and follow  up as documented in patient record.   - escitalopram ([name] [name]) 10 MG tablet; Take 1 tablet (10 mg total) by mouth daily.  Dispense: 30 tablet; Refill: 0  3. Class 2 severe obesity with serious comorbidity and body mass index (BMI) of 39.0 to 39.9 in adult, unspecified obesity type [name] [name]) [name] [name] is currently in the action stage of change. As such, his goal is to continue with weight loss efforts. He has agreed to following a lower carbohydrate, vegetable and lean protein rich diet plan.   [name] [name] is to concentrate on improving his mood first and then will get back to working on weight loss. We will follow very closely.  Exercise goals:  As is.  Behavioral modification strategies: increasing water intake and emotional eating strategies.  [name] [name] has agreed to follow-up with our clinic in 2 weeks. He was informed of the importance of frequent follow-up visits to maximize his success with intensive lifestyle modifications for his multiple health conditions.   Objective:   Blood pressure 134/68, pulse (!) 50, temperature 98 F (36.7 C), height 6\' 1"  (1.854 m), weight 295 lb (133.8 kg), SpO2 98 %. Body mass index is 38.92 kg/m.  General: Cooperative, alert, well developed, in no acute distress. HEENT: Conjunctivae and lids unremarkable. Cardiovascular: Regular rhythm.  Lungs: Normal work of breathing. Neurologic: No focal deficits.   Lab Results  Component Value Date   CREATININE 1.20 08/14/2019   BUN 26 08/14/2019   NA 142 08/14/2019   K 3.8 08/14/2019   CL 103 08/14/2019   CO2 24 08/14/2019   Lab Results  Component Value Date   ALT 23 08/14/2019   AST 20 08/14/2019   ALKPHOS 64 08/14/2019   BILITOT 0.5 08/14/2019   Lab Results  Component Value Date   HGBA1C 6.1 11/28/2019   HGBA1C 6.0 (H) 08/14/2019   HGBA1C 6.0 (H) 03/25/2019   HGBA1C 6.2 12/17/2018   HGBA1C 6.9 (H) 09/24/2018   Lab Results  Component Value Date   INSULIN 10.0 08/14/2019   INSULIN 12.4 03/25/2019    INSULIN 19.2 09/24/2018   INSULIN 13.7 05/03/2018   INSULIN 15.7 07/19/2017   Lab Results  Component Value Date   TSH 1.500 03/25/2019   Lab Results  Component Value Date   CHOL 195 08/14/2019   HDL 43 08/14/2019   LDLCALC 131 (H) 08/14/2019   LDLDIRECT 148 (H) 11/15/2007   TRIG 116 08/14/2019   CHOLHDL 5.2 (H) 01/26/2016   Lab Results  Component Value Date   WBC 7.3 11/30/2017   HGB 15.0 11/30/2017   HCT 46.8 11/30/2017   MCV 86 11/30/2017   PLT 293 11/30/2017   No results found for: IRON, TIBC, FERRITIN  Obesity Behavioral Intervention:   Approximately 15 minutes were spent on the discussion below.  ASK: We discussed the diagnosis of obesity with [name] [name] today and [name] [name] agreed to give [name] permission to discuss obesity behavioral modification therapy today.  ASSESS: [name] [name] has the diagnosis of obesity and his BMI today is 38.93. [name] [name] is in the action stage of change.   ADVISE: [name] [name] was educated on the multiple health risks of obesity as well as the benefit of weight loss to improve his health. He was advised of the need for long term treatment and the importance of lifestyle modifications to improve his current health and to decrease his risk of future health problems.  AGREE: Multiple dietary modification options and treatment options were discussed and [name] [name] agreed to follow the recommendations documented in the above note.  ARRANGE: [name] [name] was educated on the importance of frequent visits to treat obesity as outlined per CMS and USPSTF guidelines and agreed to schedule his next follow up appointment today.  Attestation Statements:   Reviewed by clinician on day of visit: allergies, medications, problem list, medical history, surgical history, family history, social history, and previous encounter notes.   I, Trixie Dredge, am acting as transcriptionist for [name], MD.  I have reviewed the above documentation for accuracy and completeness, and I  agree with the above. -  [name], MD

## 2020-04-20 ENCOUNTER — Encounter (INDEPENDENT_AMBULATORY_CARE_PROVIDER_SITE_OTHER): Payer: Self-pay | Admitting: Family Medicine

## 2020-04-20 ENCOUNTER — Ambulatory Visit (INDEPENDENT_AMBULATORY_CARE_PROVIDER_SITE_OTHER): Payer: Medicare HMO | Admitting: Family Medicine

## 2020-04-20 ENCOUNTER — Other Ambulatory Visit: Payer: Self-pay

## 2020-04-20 VITALS — BP 105/67 | HR 68 | Temp 98.2°F | Ht 73.0 in | Wt 293.0 lb

## 2020-04-20 DIAGNOSIS — Z6838 Body mass index (BMI) 38.0-38.9, adult: Secondary | ICD-10-CM | POA: Diagnosis not present

## 2020-04-20 DIAGNOSIS — F3289 Other specified depressive episodes: Secondary | ICD-10-CM | POA: Diagnosis not present

## 2020-04-20 DIAGNOSIS — E559 Vitamin D deficiency, unspecified: Secondary | ICD-10-CM | POA: Diagnosis not present

## 2020-04-20 MED ORDER — ESCITALOPRAM OXALATE 20 MG PO TABS: 20.0000 mg | ORAL_TABLET | Freq: Every day | ORAL | 0 refills | Status: DC

## 2020-04-20 MED ORDER — BUPROPION HCL ER (SR) 200 MG PO TB12: 200.0000 mg | ORAL_TABLET | Freq: Every morning | ORAL | 0 refills | Status: DC

## 2020-04-20 MED ORDER — VITAMIN D (ERGOCALCIFEROL) 1.25 MG (50000 UNIT) PO CAPS: 50000.0000 [IU] | ORAL_CAPSULE | ORAL | 0 refills | Status: DC

## 2020-04-21 NOTE — Progress Notes (Signed)
Chief Complaint:   OBESITY [name] [name] is here to discuss his progress with his obesity treatment plan along with follow-up of his obesity related diagnoses. [name] [name] is on following a lower carbohydrate, vegetable and lean protein rich diet plan and states he is following his eating plan approximately 90% of the time. [name] [name] states he is walking for 15 minutes 5 times per week.  Today's visit was #: 12 Starting weight: 340 lbs Starting date: 12/28/2016 Today's weight: 293 lbs Today's date: 04/20/2020 Total lbs lost to date: 53 Total lbs lost since last in-office visit: 2  Interim History: [name] [name] was changed to the Lower carbohydrate plan, and he is doing better with weight loss. His hunger is controlled.  Subjective:   1. Other depression with emotional eating Karma started Lexapro last visit and he is tolerating it well but isn't certain if it is helping yet.  2. Vitamin D deficiency [name] [name] is stable on Vit D, and he denies nausea or vomiting. Last Vit D level was at goal.  Assessment/Plan:   1. Other depression with emotional eating Behavior modification techniques were discussed today to help [name] [name] deal with his emotional/non-hunger eating behaviors. [name] [name] agreed to increase Lexapro to 20 mg q daily and we will refill for 90 days, with no refill, and we will refill Wellbutrin SR for 90 days with no refill. Orders and follow up as documented in patient record.   - escitalopram (LEXAPRO) 20 MG tablet; Take 1 tablet (20 mg total) by mouth daily.  Dispense: 90 tablet; Refill: 0 - buPROPion (WELLBUTRIN SR) 200 MG 12 hr tablet; Take 1 tablet (200 mg total) by mouth every morning.  Dispense: 90 tablet; Refill: 0  2. Vitamin D deficiency Low Vitamin D level contributes to fatigue and are associated with obesity, breast, and colon cancer. We will refill prescription Vitamin D for 90 days with no refill. [name] [name] will follow-up for routine testing of Vitamin D, at least 2-3 times per year  to avoid over-replacement.  - Vitamin D, Ergocalciferol, (DRISDOL) 1.25 MG (50000 UNIT) CAPS capsule; Take 1 capsule (50,000 Units total) by mouth every 14 (fourteen) days.  Dispense: 6 capsule; Refill: 0  3. Class 2 severe obesity with serious comorbidity and body mass index (BMI) of 38.0 to 38.9 in adult, unspecified obesity type [name] [name] is currently in the action stage of change. As such, his goal is to continue with weight loss efforts. He has agreed to following a lower carbohydrate, vegetable and lean protein rich diet plan.   Exercise goals: As is.  Behavioral modification strategies: increasing lean protein intake and travel eating strategies.  [name] [name] has agreed to follow-up with our clinic in 3 weeks. He was informed of the importance of frequent follow-up visits to maximize his success with intensive lifestyle modifications for his multiple health conditions.   Objective:   Blood pressure 105/67, pulse 68, temperature 98.2 F (36.8 C), height 6\' 1"  (1.854 m), weight 293 lb (132.9 kg), SpO2 96 %. Body mass index is 38.66 kg/m.  General: Cooperative, alert, well developed, in no acute distress. HEENT: Conjunctivae and lids unremarkable. Cardiovascular: Regular rhythm.  Lungs: Normal work of breathing. Neurologic: No focal deficits.   Lab Results  Component Value Date   CREATININE 1.20 08/14/2019   BUN 26 08/14/2019   NA 142 08/14/2019   K 3.8 08/14/2019   CL 103 08/14/2019   CO2 24 08/14/2019   Lab Results  Component Value Date   ALT 23 08/14/2019  AST 20 08/14/2019   ALKPHOS 64 08/14/2019   BILITOT 0.5 08/14/2019   Lab Results  Component Value Date   HGBA1C 6.1 11/28/2019   HGBA1C 6.0 (H) 08/14/2019   HGBA1C 6.0 (H) 03/25/2019   HGBA1C 6.2 12/17/2018   HGBA1C 6.9 (H) 09/24/2018   Lab Results  Component Value Date   INSULIN 10.0 08/14/2019   INSULIN 12.4 03/25/2019   INSULIN 19.2 09/24/2018   INSULIN 13.7 05/03/2018   INSULIN 15.7 07/19/2017    Lab Results  Component Value Date   TSH 1.500 03/25/2019   Lab Results  Component Value Date   CHOL 195 08/14/2019   HDL 43 08/14/2019   LDLCALC 131 (H) 08/14/2019   LDLDIRECT 148 (H) 11/15/2007   TRIG 116 08/14/2019   CHOLHDL 5.2 (H) 01/26/2016   Lab Results  Component Value Date   WBC 7.3 11/30/2017   HGB 15.0 11/30/2017   HCT 46.8 11/30/2017   MCV 86 11/30/2017   PLT 293 11/30/2017   No results found for: IRON, TIBC, FERRITIN  Obesity Behavioral Intervention:   Approximately 15 minutes were spent on the discussion below.  ASK: We discussed the diagnosis of obesity with [name] [name] today and [name] [name] agreed to give [name] permission to discuss obesity behavioral modification therapy today.  ASSESS: [name] [name] has the diagnosis of obesity and his BMI today is 38.67. [name] [name] is in the action stage of change.   ADVISE: [name] [name] was educated on the multiple health risks of obesity as well as the benefit of weight loss to improve his health. He was advised of the need for long term treatment and the importance of lifestyle modifications to improve his current health and to decrease his risk of future health problems.  AGREE: Multiple dietary modification options and treatment options were discussed and [name] [name] agreed to follow the recommendations documented in the above note.  ARRANGE: [name] [name] was educated on the importance of frequent visits to treat obesity as outlined per CMS and USPSTF guidelines and agreed to schedule his next follow up appointment today.  Attestation Statements:   Reviewed by clinician on day of visit: allergies, medications, problem list, medical history, surgical history, family history, social history, and previous encounter notes.   I, Trixie Dredge, am acting as transcriptionist for [name], MD.  I have reviewed the above documentation for accuracy and completeness, and I agree with the above. -  [name], MD

## 2020-04-30 DIAGNOSIS — G4733 Obstructive sleep apnea (adult) (pediatric): Secondary | ICD-10-CM | POA: Diagnosis not present

## 2020-05-05 ENCOUNTER — Ambulatory Visit (INDEPENDENT_AMBULATORY_CARE_PROVIDER_SITE_OTHER): Payer: Medicare HMO

## 2020-05-05 ENCOUNTER — Other Ambulatory Visit: Payer: Self-pay

## 2020-05-05 DIAGNOSIS — Z23 Encounter for immunization: Secondary | ICD-10-CM | POA: Diagnosis not present

## 2020-05-05 NOTE — Progress Notes (Signed)
Patient presents in nurse clinic for Flu Vaccine.   Flu Vaccine administered RD without complication.   See admin for details.

## 2020-05-12 ENCOUNTER — Ambulatory Visit (INDEPENDENT_AMBULATORY_CARE_PROVIDER_SITE_OTHER): Payer: Medicare HMO | Admitting: Family Medicine

## 2020-05-20 LAB — PULMONARY FUNCTION TEST
DL/VA % pred: 94 %
DL/VA: 4.35 ml/min/mmHg/L
DLCO unc % pred: 104 %
DLCO unc: 33.68 ml/min/mmHg
FEF 25-75 Post: 4.18 L/sec
FEF 25-75 Pre: 2.69 L/sec
FEF2575-%Change-Post: 55 %
FEF2575-%Pred-Post: 157 %
FEF2575-%Pred-Pre: 101 %
FEV1-%Change-Post: 12 %
FEV1-%Pred-Post: 127 %
FEV1-%Pred-Pre: 113 %
FEV1-Post: 4.34 L
FEV1-Pre: 3.86 L
FEV1FVC-%Change-Post: 6 %
FEV1FVC-%Pred-Pre: 96 %
FEV6-%Change-Post: 4 %
FEV6-%Pred-Post: 126 %
FEV6-%Pred-Pre: 121 %
FEV6-Post: 5.48 L
FEV6-Pre: 5.25 L
FEV6FVC-%Change-Post: -1 %
FEV6FVC-%Pred-Post: 101 %
FEV6FVC-%Pred-Pre: 102 %
FVC-%Change-Post: 5 %
FVC-%Pred-Post: 124 %
FVC-%Pred-Pre: 118 %
FVC-Post: 5.69 L
Post FEV1/FVC ratio: 76 %
Post FEV6/FVC ratio: 96 %
Pre FEV1/FVC ratio: 72 %
Pre FEV6/FVC Ratio: 97 %
RV % pred: 94 %
RV: 2.24 L
TLC % pred: 108 %
TLC: 7.62 L

## 2020-05-27 ENCOUNTER — Ambulatory Visit (INDEPENDENT_AMBULATORY_CARE_PROVIDER_SITE_OTHER): Payer: Medicare HMO | Admitting: Family Medicine

## 2020-05-27 ENCOUNTER — Other Ambulatory Visit: Payer: Self-pay

## 2020-05-27 ENCOUNTER — Encounter (INDEPENDENT_AMBULATORY_CARE_PROVIDER_SITE_OTHER): Payer: Self-pay | Admitting: Family Medicine

## 2020-05-27 VITALS — BP 119/69 | HR 69 | Temp 97.9°F | Ht 73.0 in | Wt 296.0 lb

## 2020-05-27 DIAGNOSIS — E66812 Obesity, class 2: Secondary | ICD-10-CM

## 2020-05-27 DIAGNOSIS — E1169 Type 2 diabetes mellitus with other specified complication: Secondary | ICD-10-CM | POA: Diagnosis not present

## 2020-05-27 DIAGNOSIS — F3289 Other specified depressive episodes: Secondary | ICD-10-CM | POA: Diagnosis not present

## 2020-05-27 DIAGNOSIS — Z6839 Body mass index (BMI) 39.0-39.9, adult: Secondary | ICD-10-CM | POA: Diagnosis not present

## 2020-05-27 MED ORDER — METFORMIN HCL 1000 MG PO TABS: 1000.0000 mg | ORAL_TABLET | Freq: Every day | ORAL | 0 refills | Status: DC

## 2020-05-28 NOTE — Progress Notes (Signed)
Chief Complaint:   OBESITY [name] [name] is here to discuss his progress with his obesity treatment plan along with follow-up of his obesity related diagnoses. [name] [name] is on following a lower carbohydrate, vegetable and lean protein rich diet plan and states he is following his eating plan approximately 90% of the time. [name] [name] states he is working in the yard, and walking for 20 minutes 3-4 times per week.  Today's visit was #: 39 Starting weight: 340 lbs Starting date: 12/28/2016 Today's weight: 296 lbs Today's date: 05/27/2020 Total lbs lost to date: 44 Total lbs lost since last in-office visit: 0  Interim History: [name] [name] continues to do well with on his Low carbohydrate plan. He is deviating with a lot of stress however but is making an effort to avoid emotional eating.  Subjective:   1. Type 2 diabetes mellitus with other specified complication, without long-term current use of insulin (HCC) [name]'s fasting BGs mostly range in 120's, and he is doing well with weight loss and diet. He denies hypoglycemia.  2. Other depression, with emotional eating [name] [name] is dealing with a lot of stress with family health issues, and he is feeling overwhelmed. He is tolerating his medications well overall.  Assessment/Plan:   1. Type 2 diabetes mellitus with other specified complication, without long-term current use of insulin (HCC) Good blood sugar control is important to decrease the likelihood of diabetic complications such as nephropathy, neuropathy, limb loss, blindness, coronary artery disease, and death. Intensive lifestyle modification including diet, exercise and weight loss are the first line of treatment for diabetes. We will refill metformin for 1 month.   - metFORMIN (GLUCOPHAGE) 1000 MG tablet; Take 1 tablet (1,000 mg total) by mouth daily with breakfast.  Dispense: 30 tablet; Refill: 0  2. Other depression, with emotional eating Behavior modification techniques were discussed today  to help [name] [name] deal with his emotional/non-hunger eating behaviors. We discussed how stress affects his health and weight, and the importance of taking care of himself. Will continue to monitor closely. Orders and follow up as documented in patient record.   3. Class 2 severe obesity with serious comorbidity and body mass index (BMI) of 39.0 to 39.9 in adult, unspecified obesity type [facility]) [name] [name] is currently in the action stage of change. As such, his goal is to continue with weight loss efforts. He has agreed to following a lower carbohydrate, vegetable and lean protein rich diet plan.   Exercise goals: As is.  Behavioral modification strategies: increasing lean protein intake and decreasing simple carbohydrates.  [name] [name] has agreed to follow-up with our clinic in 3 to 4 weeks. He was informed of the importance of frequent follow-up visits to maximize his success with intensive lifestyle modifications for his multiple health conditions.   Objective:   Blood pressure 119/69, pulse 69, temperature 97.9 F (36.6 C), height 6\' 1"  (1.854 m), weight 296 lb (134.3 kg), SpO2 95 %. Body mass index is 39.05 kg/m.  General: Cooperative, alert, well developed, in no acute distress. HEENT: Conjunctivae and lids unremarkable. Cardiovascular: Regular rhythm.  Lungs: Normal work of breathing. Neurologic: No focal deficits.   Lab Results  Component Value Date   CREATININE 1.20 08/14/2019   BUN 26 08/14/2019   NA 142 08/14/2019   K 3.8 08/14/2019   CL 103 08/14/2019   CO2 24 08/14/2019   Lab Results  Component Value Date   ALT 23 08/14/2019   AST 20 08/14/2019   ALKPHOS 64 08/14/2019   BILITOT 0.5 08/14/2019  Lab Results  Component Value Date   HGBA1C 6.1 11/28/2019   HGBA1C 6.0 (H) 08/14/2019   HGBA1C 6.0 (H) 03/25/2019   HGBA1C 6.2 12/17/2018   HGBA1C 6.9 (H) 09/24/2018   Lab Results  Component Value Date   INSULIN 10.0 08/14/2019   INSULIN 12.4 03/25/2019   INSULIN 19.2  09/24/2018   INSULIN 13.7 05/03/2018   INSULIN 15.7 07/19/2017   Lab Results  Component Value Date   TSH 1.500 03/25/2019   Lab Results  Component Value Date   CHOL 195 08/14/2019   HDL 43 08/14/2019   LDLCALC 131 (H) 08/14/2019   LDLDIRECT 148 (H) 11/15/2007   TRIG 116 08/14/2019   CHOLHDL 5.2 (H) 01/26/2016   Lab Results  Component Value Date   WBC 7.3 11/30/2017   HGB 15.0 11/30/2017   HCT 46.8 11/30/2017   MCV 86 11/30/2017   PLT 293 11/30/2017   No results found for: IRON, TIBC, FERRITIN  Obesity Behavioral Intervention:   Approximately 15 minutes were spent on the discussion below.  ASK: We discussed the diagnosis of obesity with [name] [name] today and [name] [name] agreed to give [name] permission to discuss obesity behavioral modification therapy today.  ASSESS: [name] [name] has the diagnosis of obesity and his BMI today is 39.06. [name] [name] is in the action stage of change.   ADVISE: [name] [name] was educated on the multiple health risks of obesity as well as the benefit of weight loss to improve his health. He was advised of the need for long term treatment and the importance of lifestyle modifications to improve his current health and to decrease his risk of future health problems.  AGREE: Multiple dietary modification options and treatment options were discussed and [name] [name] agreed to follow the recommendations documented in the above note.  ARRANGE: [name] [name] was educated on the importance of frequent visits to treat obesity as outlined per CMS and USPSTF guidelines and agreed to schedule his next follow up appointment today.  Attestation Statements:   Reviewed by clinician on day of visit: allergies, medications, problem list, medical history, surgical history, family history, social history, and previous encounter notes.   I, Trixie Dredge, am acting as transcriptionist for [name], MD.  I have reviewed the above documentation for accuracy and completeness, and I agree with the  above. -  [name], MD

## 2020-06-17 ENCOUNTER — Ambulatory Visit (INDEPENDENT_AMBULATORY_CARE_PROVIDER_SITE_OTHER): Payer: Self-pay | Admitting: Family Medicine

## 2020-06-19 ENCOUNTER — Other Ambulatory Visit (INDEPENDENT_AMBULATORY_CARE_PROVIDER_SITE_OTHER): Payer: Self-pay | Admitting: Family Medicine

## 2020-06-19 DIAGNOSIS — E1169 Type 2 diabetes mellitus with other specified complication: Secondary | ICD-10-CM

## 2020-06-30 ENCOUNTER — Other Ambulatory Visit (INDEPENDENT_AMBULATORY_CARE_PROVIDER_SITE_OTHER): Payer: Self-pay | Admitting: Family Medicine

## 2020-06-30 ENCOUNTER — Other Ambulatory Visit: Payer: Self-pay | Admitting: Family Medicine

## 2020-06-30 ENCOUNTER — Other Ambulatory Visit: Payer: Self-pay

## 2020-06-30 DIAGNOSIS — E1169 Type 2 diabetes mellitus with other specified complication: Secondary | ICD-10-CM

## 2020-06-30 MED ORDER — FUROSEMIDE 40 MG PO TABS
40.0000 mg | ORAL_TABLET | Freq: Every day | ORAL | 0 refills | Status: DC
Start: 2020-06-30 — End: 2020-12-24

## 2020-06-30 MED ORDER — ACCU-CHEK AVIVA PLUS VI STRP
1.0000 | ORAL_STRIP | Freq: Two times a day (BID) | 0 refills | Status: DC
Start: 2020-06-30 — End: 2020-09-02

## 2020-07-01 ENCOUNTER — Encounter (INDEPENDENT_AMBULATORY_CARE_PROVIDER_SITE_OTHER): Payer: Self-pay

## 2020-07-01 NOTE — Telephone Encounter (Signed)
Pt does not have existing f/u and is currently overdue. Mychart message sent to pt asking if he would like to schedule.

## 2020-07-01 NOTE — Telephone Encounter (Signed)
Last OV with Dr. Beasley 

## 2020-07-06 ENCOUNTER — Other Ambulatory Visit: Payer: Self-pay

## 2020-07-06 DIAGNOSIS — E1169 Type 2 diabetes mellitus with other specified complication: Secondary | ICD-10-CM

## 2020-07-06 MED ORDER — METFORMIN HCL 1000 MG PO TABS: 1000.0000 mg | ORAL_TABLET | Freq: Every day | ORAL | 0 refills | Status: DC

## 2020-07-31 ENCOUNTER — Other Ambulatory Visit (INDEPENDENT_AMBULATORY_CARE_PROVIDER_SITE_OTHER): Payer: Self-pay | Admitting: Family Medicine

## 2020-07-31 DIAGNOSIS — F3289 Other specified depressive episodes: Secondary | ICD-10-CM

## 2020-08-03 ENCOUNTER — Other Ambulatory Visit: Payer: Self-pay

## 2020-08-03 DIAGNOSIS — F3289 Other specified depressive episodes: Secondary | ICD-10-CM

## 2020-08-03 MED ORDER — ESCITALOPRAM OXALATE 20 MG PO TABS: 20.0000 mg | ORAL_TABLET | Freq: Every day | ORAL | 0 refills | Status: DC

## 2020-08-03 NOTE — Telephone Encounter (Signed)
Patient LVM on nurse line requesting PCP to fill generic Lexapro, as it was denied by previous prescriber. Please advise.

## 2020-08-05 ENCOUNTER — Encounter (INDEPENDENT_AMBULATORY_CARE_PROVIDER_SITE_OTHER): Payer: Self-pay | Admitting: Family Medicine

## 2020-08-05 ENCOUNTER — Ambulatory Visit (INDEPENDENT_AMBULATORY_CARE_PROVIDER_SITE_OTHER): Payer: Medicare HMO | Admitting: Family Medicine

## 2020-08-05 ENCOUNTER — Other Ambulatory Visit: Payer: Self-pay

## 2020-08-05 VITALS — BP 116/70 | HR 63 | Temp 97.4°F | Ht 73.0 in | Wt 304.0 lb

## 2020-08-05 DIAGNOSIS — I10 Essential (primary) hypertension: Secondary | ICD-10-CM | POA: Diagnosis not present

## 2020-08-05 DIAGNOSIS — Z6841 Body Mass Index (BMI) 40.0 and over, adult: Secondary | ICD-10-CM

## 2020-08-05 DIAGNOSIS — E559 Vitamin D deficiency, unspecified: Secondary | ICD-10-CM | POA: Diagnosis not present

## 2020-08-05 MED ORDER — VITAMIN D (ERGOCALCIFEROL) 1.25 MG (50000 UNIT) PO CAPS: 50000.0000 [IU] | ORAL_CAPSULE | ORAL | 0 refills | Status: DC

## 2020-08-05 MED ORDER — METOPROLOL SUCCINATE ER 25 MG PO TB24: 25.0000 mg | ORAL_TABLET | Freq: Every day | ORAL | 0 refills | Status: DC

## 2020-08-06 NOTE — Progress Notes (Signed)
Chief Complaint:   OBESITY Fischer is here to discuss his progress with his obesity treatment plan along with follow-up of his obesity related diagnoses. Olman is on following a lower carbohydrate, vegetable and lean protein rich diet plan and states he is following his eating plan approximately 70% of the time. Danuel states he is walking for 20-25 minutes 4 times per week.  Today's visit was #: 59 Starting weight: 340 lbs Starting date: 12/28/2016 Today's weight: 304 lbs Today's date: 08/05/2020 Total lbs lost to date: 36 Total lbs lost since last in-office visit: 0  Interim History: Hosam has been struggling more over the holidays. He has traveled to help out multiple family members, and he has been under a lot of stress. He hasn't been able to stick with his plan but he is ready to get back on track.  Subjective:   1. Essential hypertension Mylen's blood pressure is stable on his medications even with increased stress. He requests a refill today. He denies chest pain.  2. Vitamin D deficiency Parish is stable on Vit D. He had a fall recently but no fractions.  Assessment/Plan:   1. Essential hypertension Saburo is working on healthy weight loss and exercise to improve blood pressure control. We will watch for signs of hypotension as he continues his lifestyle modifications. We will refill Toprol XL for 90 days with no refills.  - metoprolol succinate (TOPROL-XL) 25 MG 24 hr tablet; Take 1 tablet (25 mg total) by mouth daily.  Dispense: 90 tablet; Refill: 0  2. Vitamin D deficiency Low Vitamin D level contributes to fatigue and are associated with obesity, breast, and colon cancer. We will refill prescription Vitamin D for 90 days with no refills. Budd will follow-up for routine testing of Vitamin D, at least 2-3 times per year to avoid over-replacement.  - Vitamin D, Ergocalciferol, (DRISDOL) 1.25 MG (50000 UNIT) CAPS capsule; Take 1 capsule (50,000 Units total) by mouth  every 14 (fourteen) days.  Dispense: 6 capsule; Refill: 0  3. Class 3 severe obesity with serious comorbidity and body mass index (BMI) of 40.0 to 44.9 in adult, unspecified obesity type [facility]) Tymeer is currently in the action stage of change. As such, his goal is to continue with weight loss efforts. He has agreed to keeping a food journal and adhering to recommended goals of 1500-1700 calories and 90+ grams of protein daily.   Exercise goals: As is.  Behavioral modification strategies: meal planning and cooking strategies and keeping a strict food journal.  Larrell has agreed to follow-up with our clinic in 2 weeks. He was informed of the importance of frequent follow-up visits to maximize his success with intensive lifestyle modifications for his multiple health conditions.   Objective:   Blood pressure 116/70, pulse 63, temperature (!) 97.4 F (36.3 C), height 6\' 1"  (1.854 m), weight (!) 304 lb (137.9 kg), SpO2 97 %. Body mass index is 40.11 kg/m.  General: Cooperative, alert, well developed, in no acute distress. HEENT: Conjunctivae and lids unremarkable. Cardiovascular: Regular rhythm.  Lungs: Normal work of breathing. Neurologic: No focal deficits.   Lab Results  Component Value Date   CREATININE 1.20 08/14/2019   BUN 26 08/14/2019   NA 142 08/14/2019   K 3.8 08/14/2019   CL 103 08/14/2019   CO2 24 08/14/2019   Lab Results  Component Value Date   ALT 23 08/14/2019   AST 20 08/14/2019   ALKPHOS 64 08/14/2019   BILITOT 0.5 08/14/2019  Lab Results  Component Value Date   HGBA1C 6.1 11/28/2019   HGBA1C 6.0 (H) 08/14/2019   HGBA1C 6.0 (H) 03/25/2019   HGBA1C 6.2 12/17/2018   HGBA1C 6.9 (H) 09/24/2018   Lab Results  Component Value Date   INSULIN 10.0 08/14/2019   INSULIN 12.4 03/25/2019   INSULIN 19.2 09/24/2018   INSULIN 13.7 05/03/2018   INSULIN 15.7 07/19/2017   Lab Results  Component Value Date   TSH 1.500 03/25/2019   Lab Results  Component Value  Date   CHOL 195 08/14/2019   HDL 43 08/14/2019   LDLCALC 131 (H) 08/14/2019   LDLDIRECT 148 (H) 11/15/2007   TRIG 116 08/14/2019   CHOLHDL 5.2 (H) 01/26/2016   Lab Results  Component Value Date   WBC 7.3 11/30/2017   HGB 15.0 11/30/2017   HCT 46.8 11/30/2017   MCV 86 11/30/2017   PLT 293 11/30/2017   No results found for: IRON, TIBC, FERRITIN  Obesity Behavioral Intervention:   Approximately 15 minutes were spent on the discussion below.  ASK: We discussed the diagnosis of obesity with [name] today and Darric agreed to give [name] permission to discuss obesity behavioral modification therapy today.  ASSESS: Doy has the diagnosis of obesity and his BMI today is 40.12. Miguelangel is in the action stage of change.   ADVISE: Marlo was educated on the multiple health risks of obesity as well as the benefit of weight loss to improve his health. He was advised of the need for long term treatment and the importance of lifestyle modifications to improve his current health and to decrease his risk of future health problems.  AGREE: Multiple dietary modification options and treatment options were discussed and Kedus agreed to follow the recommendations documented in the above note.  ARRANGE: Rabon was educated on the importance of frequent visits to treat obesity as outlined per CMS and USPSTF guidelines and agreed to schedule his next follow up appointment today.  Attestation Statements:   Reviewed by clinician on day of visit: allergies, medications, problem list, medical history, surgical history, family history, social history, and previous encounter notes.   I, Trixie Dredge, am acting as transcriptionist for [name], MD.  I have reviewed the above documentation for accuracy and completeness, and I agree with the above. -  [name], MD

## 2020-08-10 ENCOUNTER — Ambulatory Visit (INDEPENDENT_AMBULATORY_CARE_PROVIDER_SITE_OTHER): Payer: Medicare HMO | Admitting: Family Medicine

## 2020-08-10 ENCOUNTER — Other Ambulatory Visit: Payer: Self-pay

## 2020-08-10 VITALS — BP 136/82 | HR 76 | Temp 98.1°F | Ht 73.0 in | Wt 304.0 lb

## 2020-08-10 DIAGNOSIS — Z6841 Body Mass Index (BMI) 40.0 and over, adult: Secondary | ICD-10-CM

## 2020-08-10 DIAGNOSIS — I10 Essential (primary) hypertension: Secondary | ICD-10-CM | POA: Diagnosis not present

## 2020-08-10 DIAGNOSIS — R21 Rash and other nonspecific skin eruption: Secondary | ICD-10-CM | POA: Diagnosis not present

## 2020-08-11 NOTE — Progress Notes (Signed)
Chief Complaint:   OBESITY [name] [name] is here to discuss his progress with his obesity treatment plan along with follow-up of his obesity related diagnoses. [name] [name] is on keeping a food journal and adhering to recommended goals of 1500-1700 calories and 90+ grams of protein daily and states he is following his eating plan approximately 100% of the time. [name] [name] states he is doing 0 minutes 0 times per week.  Today's visit was #: 40 Starting weight: 340 lbs Starting date: 12/28/2016 Today's weight: 304 lbs Today's date: 08/10/2020 Total lbs lost to date: 36 Total lbs lost since last in-office visit: 0  Interim History: [name] [name] continues to work on diet and weight loss. He has started to journal and has downloaded MyFitness Pal app, but he is struggling with the details.  Subjective:   1. Essential hypertension [name] [name]'s blood pressure is controlled on his medications, and he is still dealing with family stress but working on diet, exercise, and weight loss.  Assessment/Plan:   1. Essential hypertension [name] [name] will continue his medications, diet, exercise, and healthy weight loss to improve blood pressure control. He will continue to monitor, and will watch for signs of hypotension as he continues his lifestyle modifications.  2. Class 3 severe obesity with serious comorbidity and body mass index (BMI) of 40.0 to 44.9 in adult, unspecified obesity type [facility]) [name] [name] is currently in the action stage of change. As such, his goal is to continue with weight loss efforts. He has agreed to keeping a food journal and adhering to recommended goals of 1500-1800 calories and 85+ grams of protein daily.   [name] [name] was shown how to use the app including the snack details of how to be as accurate as possible. He feels confident that he can do this now.  Behavioral modification strategies: keeping a strict food journal.  [name] [name] has agreed to follow-up with our clinic in 2 to 3 weeks. He was informed of  the importance of frequent follow-up visits to maximize his success with intensive lifestyle modifications for his multiple health conditions.   Objective:   Blood pressure 136/82, pulse 76, temperature 98.1 F (36.7 C), height 6\' 1"  (1.854 m), weight (!) 304 lb (137.9 kg), SpO2 96 %. Body mass index is 40.11 kg/m.  General: Cooperative, alert, well developed, in no acute distress. HEENT: Conjunctivae and lids unremarkable. Cardiovascular: Regular rhythm.  Lungs: Normal work of breathing. Neurologic: No focal deficits.   Lab Results  Component Value Date   CREATININE 1.20 08/14/2019   BUN 26 08/14/2019   NA 142 08/14/2019   K 3.8 08/14/2019   CL 103 08/14/2019   CO2 24 08/14/2019   Lab Results  Component Value Date   ALT 23 08/14/2019   AST 20 08/14/2019   ALKPHOS 64 08/14/2019   BILITOT 0.5 08/14/2019   Lab Results  Component Value Date   HGBA1C 6.1 11/28/2019   HGBA1C 6.0 (H) 08/14/2019   HGBA1C 6.0 (H) 03/25/2019   HGBA1C 6.2 12/17/2018   HGBA1C 6.9 (H) 09/24/2018   Lab Results  Component Value Date   INSULIN 10.0 08/14/2019   INSULIN 12.4 03/25/2019   INSULIN 19.2 09/24/2018   INSULIN 13.7 05/03/2018   INSULIN 15.7 07/19/2017   Lab Results  Component Value Date   TSH 1.500 03/25/2019   Lab Results  Component Value Date   CHOL 195 08/14/2019   HDL 43 08/14/2019   LDLCALC 131 (H) 08/14/2019   LDLDIRECT 148 (H) 11/15/2007   TRIG 116 08/14/2019  CHOLHDL 5.2 (H) 01/26/2016   Lab Results  Component Value Date   WBC 7.3 11/30/2017   HGB 15.0 11/30/2017   HCT 46.8 11/30/2017   MCV 86 11/30/2017   PLT 293 11/30/2017   No results found for: IRON, TIBC, FERRITIN  Attestation Statements:   Reviewed by clinician on day of visit: allergies, medications, problem list, medical history, surgical history, family history, social history, and previous encounter notes.  Time spent on visit including pre-visit chart review and post-visit care and charting was  20 minutes.    I, Trixie Dredge, am acting as transcriptionist for [name], MD.  I have reviewed the above documentation for accuracy and completeness, and I agree with the above. -  [name], MD

## 2020-08-19 ENCOUNTER — Encounter (INDEPENDENT_AMBULATORY_CARE_PROVIDER_SITE_OTHER): Payer: Self-pay | Admitting: Family Medicine

## 2020-08-19 ENCOUNTER — Other Ambulatory Visit: Payer: Self-pay

## 2020-08-19 ENCOUNTER — Telehealth (INDEPENDENT_AMBULATORY_CARE_PROVIDER_SITE_OTHER): Payer: Medicare HMO | Admitting: Family Medicine

## 2020-08-19 DIAGNOSIS — Z6841 Body Mass Index (BMI) 40.0 and over, adult: Secondary | ICD-10-CM | POA: Diagnosis not present

## 2020-08-19 DIAGNOSIS — E1169 Type 2 diabetes mellitus with other specified complication: Secondary | ICD-10-CM | POA: Diagnosis not present

## 2020-08-20 NOTE — Progress Notes (Signed)
TeleHealth Visit:  Due to the COVID-19 pandemic, this visit was completed with telemedicine (audio/video) technology to reduce patient and provider exposure as well as to preserve personal protective equipment.   [name] [name] has verbally consented to this TeleHealth visit. The patient is located at home, the provider is located at the [facility] office. The participants in this visit include the listed provider and patient. The visit was conducted today via MyChart video.   Chief Complaint: OBESITY [name] [name] is here to discuss his progress with his obesity treatment plan along with follow-up of his obesity related diagnoses. [name] [name] is on keeping a food journal and adhering to recommended goals of 1500-1800 calories and 85+ grams of protein daily and states he is following his eating plan approximately 98% of the time. [name] [name] states he is doing yard work for 60-120 minutes 5-6 times per week.  Today's visit was #: 41 Starting weight: 340 lbs Starting date: 12/28/2016  Interim History: [name] [name] has been doing well with journaling. He is using the MyFitness Pal app and he has gotten used to it. His hunger is mostly controlled and he is doing well meeting his protein goals.  Subjective:   1. Type 2 diabetes mellitus with other specified complication, without long-term current use of insulin (HCC) [name] [name]'s fasting BGs are improving, and are now in the 120's he denies signs of hypoglycemia. He is doing well with diet and weight loss.  Assessment/Plan:   1. Type 2 diabetes mellitus with other specified complication, without long-term current use of insulin (HCC) Good blood sugar control is important to decrease the likelihood of diabetic complications such as nephropathy, neuropathy, limb loss, blindness, coronary artery disease, and death. Intensive lifestyle modification including diet, exercise and weight loss are the first line of treatment for diabetes. [name] [name] will continue  journaling and watching his simple carbohydrates, and will continue to monitor closely.  2. Class 3 severe obesity with serious comorbidity and body mass index (BMI) of 40.0 to 44.9 in adult, unspecified obesity type [facility]) [name] [name] is currently in the action stage of change. As such, his goal is to continue with weight loss efforts. He has agreed to keeping a food journal and adhering to recommended goals of 1500-1800 calories and 100+ grams of protein daily.   Exercise goals: As is.  Behavioral modification strategies: meal planning and cooking strategies and keeping a strict food journal.  [name] [name] has agreed to follow-up with our clinic in 2 weeks. He was informed of the importance of frequent follow-up visits to maximize his success with intensive lifestyle modifications for his multiple health conditions.  Objective:   VITALS: Per patient if applicable, see vitals. GENERAL: Alert and in no acute distress. CARDIOPULMONARY: No increased WOB. Speaking in clear sentences.  PSYCH: Pleasant and cooperative. Speech normal rate and rhythm. Affect is appropriate. Insight and judgement are appropriate. Attention is focused, linear, and appropriate.  NEURO: Oriented as arrived to appointment on time with no prompting.   Lab Results  Component Value Date   CREATININE 1.20 08/14/2019   BUN 26 08/14/2019   NA 142 08/14/2019   K 3.8 08/14/2019   CL 103 08/14/2019   CO2 24 08/14/2019   Lab Results  Component Value Date   ALT 23 08/14/2019   AST 20 08/14/2019   ALKPHOS 64 08/14/2019   BILITOT 0.5 08/14/2019   Lab Results  Component Value Date   HGBA1C 6.1 11/28/2019   HGBA1C 6.0 (H) 08/14/2019   HGBA1C 6.0 (H) 03/25/2019  HGBA1C 6.2 12/17/2018   HGBA1C 6.9 (H) 09/24/2018   Lab Results  Component Value Date   INSULIN 10.0 08/14/2019   INSULIN 12.4 03/25/2019   INSULIN 19.2 09/24/2018   INSULIN 13.7 05/03/2018   INSULIN 15.7 07/19/2017   Lab Results  Component Value Date   TSH  1.500 03/25/2019   Lab Results  Component Value Date   CHOL 195 08/14/2019   HDL 43 08/14/2019   LDLCALC 131 (H) 08/14/2019   LDLDIRECT 148 (H) 11/15/2007   TRIG 116 08/14/2019   CHOLHDL 5.2 (H) 01/26/2016   Lab Results  Component Value Date   WBC 7.3 11/30/2017   HGB 15.0 11/30/2017   HCT 46.8 11/30/2017   MCV 86 11/30/2017   PLT 293 11/30/2017   No results found for: IRON, TIBC, FERRITIN  Attestation Statements:   Reviewed by clinician on day of visit: allergies, medications, problem list, medical history, surgical history, family history, social history, and previous encounter notes.   I, Trixie Dredge, am acting as transcriptionist for [name], MD.  I have reviewed the above documentation for accuracy and completeness, and I agree with the above. - [name], MD

## 2020-08-27 ENCOUNTER — Encounter (INDEPENDENT_AMBULATORY_CARE_PROVIDER_SITE_OTHER): Payer: Self-pay | Admitting: Family Medicine

## 2020-09-02 ENCOUNTER — Other Ambulatory Visit: Payer: Self-pay

## 2020-09-02 ENCOUNTER — Ambulatory Visit (INDEPENDENT_AMBULATORY_CARE_PROVIDER_SITE_OTHER): Payer: Medicare HMO | Admitting: Family Medicine

## 2020-09-02 ENCOUNTER — Encounter (INDEPENDENT_AMBULATORY_CARE_PROVIDER_SITE_OTHER): Payer: Self-pay | Admitting: Family Medicine

## 2020-09-02 VITALS — BP 119/65 | HR 64 | Temp 97.4°F | Ht 73.0 in | Wt 304.0 lb

## 2020-09-02 DIAGNOSIS — E1169 Type 2 diabetes mellitus with other specified complication: Secondary | ICD-10-CM | POA: Diagnosis not present

## 2020-09-02 DIAGNOSIS — E559 Vitamin D deficiency, unspecified: Secondary | ICD-10-CM

## 2020-09-02 DIAGNOSIS — H0102A Squamous blepharitis right eye, upper and lower eyelids: Secondary | ICD-10-CM | POA: Diagnosis not present

## 2020-09-02 DIAGNOSIS — E119 Type 2 diabetes mellitus without complications: Secondary | ICD-10-CM | POA: Diagnosis not present

## 2020-09-02 DIAGNOSIS — H0102B Squamous blepharitis left eye, upper and lower eyelids: Secondary | ICD-10-CM | POA: Diagnosis not present

## 2020-09-02 DIAGNOSIS — Z6841 Body Mass Index (BMI) 40.0 and over, adult: Secondary | ICD-10-CM

## 2020-09-02 DIAGNOSIS — Z961 Presence of intraocular lens: Secondary | ICD-10-CM | POA: Diagnosis not present

## 2020-09-02 DIAGNOSIS — H43811 Vitreous degeneration, right eye: Secondary | ICD-10-CM | POA: Diagnosis not present

## 2020-09-02 LAB — HM DIABETES EYE EXAM

## 2020-09-02 MED ORDER — ACCU-CHEK AVIVA PLUS VI STRP: 1.0000 | ORAL_STRIP | Freq: Two times a day (BID) | 0 refills | Status: DC

## 2020-09-03 NOTE — Progress Notes (Signed)
Chief Complaint:   OBESITY [name] [name] is here to discuss his progress with his obesity treatment plan along with follow-up of his obesity related diagnoses. [name] [name] is on keeping a food journal and adhering to recommended goals of 1500-1800 calories and 100 grams of protein daily and states he is following his eating plan approximately 40% of the time. [name] [name] states he is doing 0 minutes 0 times per week.  Today's visit was #: 40 Starting weight: 340 lbs Starting date: 12/28/2016 Today's weight: 304 lbs Today's date: 09/02/2020 Total lbs lost to date: 36 Total lbs lost since last in-office visit: 0  Interim History: [name] [name] finds journaling confusing due to the need to determine serving size. He has gone back to the low carb which he likes. He has not been active recently because he fell and hurt his hip.  Subjective:   1. Type 2 diabetes mellitus with other specified complication, without long-term current use of insulin (HCC) [name] [name]'s diabetes mellitus is well controlled. Last A1c was 6.1, and his fasting BGs range between 128 and 130. He denies hypoglycemia. He is on metformin only.  Lab Results  Component Value Date   HGBA1C 6.1 11/28/2019   HGBA1C 6.0 (H) 08/14/2019   HGBA1C 6.0 (H) 03/25/2019   Lab Results  Component Value Date   LDLCALC 131 (H) 08/14/2019   CREATININE 1.20 08/14/2019   Lab Results  Component Value Date   INSULIN 10.0 08/14/2019   INSULIN 12.4 03/25/2019   INSULIN 19.2 09/24/2018   INSULIN 13.7 05/03/2018   INSULIN 15.7 07/19/2017   2. Vitamin D deficiency [name] [name]'s last Vit D level was at goal. He is on prescription Vit D 50,000 IU every 14 days.  Assessment/Plan:   1. Type 2 diabetes mellitus with other specified complication, without long-term current use of insulin (HCC) Continue metformin.  We will recheck labs at his next office visit.  - glucose blood (ACCU-CHEK AVIVA PLUS) test strip; 1 each by Other route 2 (two) times daily. Use to check  blood sugar 2x per day. Dx Code: e11.9  Dispense: 180 strip; Refill: 0  2. Vitamin D deficiency We will refill prescription Vitamin D for 1 month, and we will recheck labs at his next office visit. [name] [name] will follow-up for routine testing of Vitamin D, at least 2-3 times per year to avoid over-replacement.  3. Class 3 severe obesity with serious comorbidity and body mass index (BMI) of 40.0 to 44.9 in adult, unspecified obesity type [name]) [name] [name] is currently in the action stage of change. As such, his goal is to continue with weight loss efforts. He has agreed to keeping a food journal and adhering to recommended goals of 1500-1800 calories and 100+ grams of protein daily or following a lower carbohydrate, vegetable and lean protein rich diet plan.   [name] [name] was given resource of [employer] to help look up calories.[name] He may have a "2 Good" yogurt once daily on the low carbohydrate plan.  Exercise goals: Older adults should follow the adult guidelines. When older adults cannot meet the adult guidelines, they should be as physically active as their abilities and conditions will allow. Encouraged using bands a few times per week.  Behavioral modification strategies: increasing lean protein intake, decreasing simple carbohydrates, keeping healthy foods in the home and better snacking choices.  [name] [name] has agreed to follow-up with our clinic in 2 weeks with [name] [name].  Objective:   Blood pressure 119/65, pulse 64, temperature (!) 97.4 F (36.3 C),  height 6\' 1"  (1.854 m), weight (!) 304 lb (137.9 kg), SpO2 96 %. Body mass index is 40.11 kg/m.  General: Cooperative, alert, well developed, in no acute distress. HEENT: Conjunctivae and lids unremarkable. Cardiovascular: Regular rhythm.  Lungs: Normal work of breathing. Neurologic: No focal deficits.   Lab Results  Component Value Date   CREATININE 1.20 08/14/2019   BUN 26 08/14/2019   NA 142 08/14/2019   K 3.8 08/14/2019   CL 103  08/14/2019   CO2 24 08/14/2019   Lab Results  Component Value Date   ALT 23 08/14/2019   AST 20 08/14/2019   ALKPHOS 64 08/14/2019   BILITOT 0.5 08/14/2019   Lab Results  Component Value Date   HGBA1C 6.1 11/28/2019   HGBA1C 6.0 (H) 08/14/2019   HGBA1C 6.0 (H) 03/25/2019   HGBA1C 6.2 12/17/2018   HGBA1C 6.9 (H) 09/24/2018   Lab Results  Component Value Date   INSULIN 10.0 08/14/2019   INSULIN 12.4 03/25/2019   INSULIN 19.2 09/24/2018   INSULIN 13.7 05/03/2018   INSULIN 15.7 07/19/2017   Lab Results  Component Value Date   TSH 1.500 03/25/2019   Lab Results  Component Value Date   CHOL 195 08/14/2019   HDL 43 08/14/2019   LDLCALC 131 (H) 08/14/2019   LDLDIRECT 148 (H) 11/15/2007   TRIG 116 08/14/2019   CHOLHDL 5.2 (H) 01/26/2016   Lab Results  Component Value Date   WBC 7.3 11/30/2017   HGB 15.0 11/30/2017   HCT 46.8 11/30/2017   MCV 86 11/30/2017   PLT 293 11/30/2017   No results found for: IRON, TIBC, FERRITIN  Obesity Behavioral Intervention:   Approximately 15 minutes were spent on the discussion below.  ASK: We discussed the diagnosis of obesity with [name] [name] today and [name] [name] agreed to give [name] permission to discuss obesity behavioral modification therapy today.  ASSESS: [name] [name] has the diagnosis of obesity and his BMI today is 40.12. [name] [name] is in the action stage of change.   ADVISE: [name] [name] was educated on the multiple health risks of obesity as well as the benefit of weight loss to improve his health. He was advised of the need for long term treatment and the importance of lifestyle modifications to improve his current health and to decrease his risk of future health problems.  AGREE: Multiple dietary modification options and treatment options were discussed and [name] [name] agreed to follow the recommendations documented in the above note.  ARRANGE: [name] [name] was educated on the importance of frequent visits to treat obesity as outlined per CMS and USPSTF  guidelines and agreed to schedule his next follow up appointment today.  Attestation Statements:   Reviewed by clinician on day of visit: allergies, medications, problem list, medical history, surgical history, family history, social history, and previous encounter notes.   [name] [name], am acting as Location manager for [name] Schwab, [name] [name].  I have reviewed the above documentation for accuracy and completeness, and I agree with the above. -  [name] Fick, [name] [name]

## 2020-09-07 ENCOUNTER — Encounter (INDEPENDENT_AMBULATORY_CARE_PROVIDER_SITE_OTHER): Payer: Self-pay | Admitting: Family Medicine

## 2020-09-10 DIAGNOSIS — R21 Rash and other nonspecific skin eruption: Secondary | ICD-10-CM | POA: Diagnosis not present

## 2020-09-16 ENCOUNTER — Other Ambulatory Visit: Payer: Self-pay

## 2020-09-16 ENCOUNTER — Ambulatory Visit (INDEPENDENT_AMBULATORY_CARE_PROVIDER_SITE_OTHER): Payer: Medicare HMO | Admitting: Family Medicine

## 2020-09-16 ENCOUNTER — Encounter (INDEPENDENT_AMBULATORY_CARE_PROVIDER_SITE_OTHER): Payer: Self-pay | Admitting: Family Medicine

## 2020-09-16 VITALS — BP 134/66 | HR 60 | Temp 97.9°F | Ht 73.0 in | Wt 307.0 lb

## 2020-09-16 DIAGNOSIS — M79641 Pain in right hand: Secondary | ICD-10-CM | POA: Diagnosis not present

## 2020-09-16 DIAGNOSIS — F3289 Other specified depressive episodes: Secondary | ICD-10-CM | POA: Diagnosis not present

## 2020-09-16 DIAGNOSIS — E1169 Type 2 diabetes mellitus with other specified complication: Secondary | ICD-10-CM | POA: Diagnosis not present

## 2020-09-16 DIAGNOSIS — E559 Vitamin D deficiency, unspecified: Secondary | ICD-10-CM

## 2020-09-16 DIAGNOSIS — M79642 Pain in left hand: Secondary | ICD-10-CM | POA: Diagnosis not present

## 2020-09-16 DIAGNOSIS — E782 Mixed hyperlipidemia: Secondary | ICD-10-CM

## 2020-09-16 DIAGNOSIS — Z6841 Body Mass Index (BMI) 40.0 and over, adult: Secondary | ICD-10-CM | POA: Diagnosis not present

## 2020-09-16 MED ORDER — BUPROPION HCL ER (SR) 200 MG PO TB12: 200.0000 mg | ORAL_TABLET | Freq: Every morning | ORAL | 0 refills | Status: DC

## 2020-09-17 LAB — COMPREHENSIVE METABOLIC PANEL
ALT: 23 IU/L (ref 0–44)
AST: 17 IU/L (ref 0–40)
Albumin/Globulin Ratio: 2 (ref 1.2–2.2)
Albumin: 4.3 g/dL (ref 3.7–4.7)
Alkaline Phosphatase: 53 IU/L (ref 44–121)
BUN/Creatinine Ratio: 15 (ref 10–24)
BUN: 19 mg/dL (ref 8–27)
Bilirubin Total: 0.6 mg/dL (ref 0.0–1.2)
CO2: 21 mmol/L (ref 20–29)
Calcium: 9.2 mg/dL (ref 8.6–10.2)
Chloride: 100 mmol/L (ref 96–106)
Creatinine, Ser: 1.28 mg/dL — ABNORMAL HIGH (ref 0.76–1.27)
GFR calc Af Amer: 64 mL/min/{1.73_m2} (ref >59)
GFR calc non Af Amer: 55 mL/min/{1.73_m2} — ABNORMAL LOW (ref >59)
Globulin, Total: 2.1 g/dL (ref 1.5–4.5)
Glucose: 115 mg/dL — ABNORMAL HIGH (ref 65–99)
Potassium: 4 mmol/L (ref 3.5–5.2)
Sodium: 136 mmol/L (ref 134–144)
Total Protein: 6.4 g/dL (ref 6.0–8.5)

## 2020-09-17 LAB — LIPID PANEL WITH LDL/HDL RATIO
Cholesterol, Total: 167 mg/dL (ref 100–199)
HDL: 42 mg/dL (ref >39)
LDL Chol Calc (NIH): 105 mg/dL — ABNORMAL HIGH (ref 0–99)
LDL/HDL Ratio: 2.5 ratio (ref 0.0–3.6)
Triglycerides: 111 mg/dL (ref 0–149)
VLDL Cholesterol Cal: 20 mg/dL (ref 5–40)

## 2020-09-17 LAB — MICROALBUMIN / CREATININE URINE RATIO
Creatinine, Urine: 175.6 mg/dL
Microalb/Creat Ratio: 3 mg/g creat (ref 0–29)
Microalbumin, Urine: 4.4 ug/mL

## 2020-09-17 LAB — INSULIN, RANDOM: INSULIN: 9.1 u[IU]/mL (ref 2.6–24.9)

## 2020-09-17 LAB — HEMOGLOBIN A1C
Est. average glucose Bld gHb Est-mCnc: 143 mg/dL
Hgb A1c MFr Bld: 6.6 % — ABNORMAL HIGH (ref 4.8–5.6)

## 2020-09-17 LAB — VITAMIN D 25 HYDROXY (VIT D DEFICIENCY, FRACTURES): Vit D, 25-Hydroxy: 52.1 ng/mL (ref 30.0–100.0)

## 2020-09-17 NOTE — Progress Notes (Signed)
Chief Complaint:   OBESITY Topher is here to discuss his progress with his obesity treatment plan along with follow-up of his obesity related diagnoses. Cederick is on keeping a food journal and adhering to recommended goals of 1500-1800 calories and 100+ grams of protein daily and states he is following his eating plan approximately 95-97% of the time. Armondo states he is doing 0 minutes 0 times per week.  Today's visit was #: 78 Starting weight: 340 lbs Starting date: 12/28/2016 Today's weight: 307 lbs Today's date: 09/16/2020 Total lbs lost to date: 33 Total lbs lost since last in-office visit: 0  Interim History: Kengo has struggled to eat everything on his plan at times. He is planning on increasing exercises and getting back on track with journaling. He is making plans for retirement which appears to be a good thing for him.   Subjective:   1. Type 2 diabetes mellitus with other specified complication, without long-term current use of insulin (HCC) Gregori's fasting BGs mostly range in the 130's. He is doing well with metformin. He has been working on diet and exercise, and he has minimal glucose excursions.  2. Bilateral hand pain Benjie has a family history of rheumatoid arthritis, and he notes bilateral hand pain has worsened recently especially in the morning. This temporarily affects his grip and he wonders what his treatment options are.  3. Vitamin D deficiency Zaelyn is due to have labs checked.  4. Mixed hyperlipidemia Teodor is on Crestor, and he is working on diet. He is due for labs today.  5. Other depression with emotional eating Tomoya is stable on Wellbutrin. He has had a stressful few years with work and the pandemic, but he seems to have a healthy attitude towards this stress.  Assessment/Plan:   1. Type 2 diabetes mellitus with other specified complication, without long-term current use of insulin (HCC) Good blood sugar control is important to decrease  the likelihood of diabetic complications such as nephropathy, neuropathy, limb loss, blindness, coronary artery disease, and death. Intensive lifestyle modification including diet, exercise and weight loss are the first line of treatment for diabetes. We will check labs today. Heaven will continue metformin, and will follow up as directed.  - Hemoglobin A1c - Insulin, random - Comprehensive metabolic panel - Microalbumin / creatinine urine ratio  2. Bilateral hand pain We will refer [name] to Dr. Lynne Leader for evaluation and treatment.  - Ambulatory referral to Sports Medicine  3. Vitamin D deficiency Low Vitamin D level contributes to fatigue and are associated with obesity, breast, and colon cancer. We will check labs today. Carver will follow-up for routine testing of Vitamin D, at least 2-3 times per year to avoid over-replacement.  - VITAMIN D 25 Hydroxy (Vit-D Deficiency, Fractures)  4. Mixed hyperlipidemia Cardiovascular risk and specific lipid/LDL goals reviewed. We discussed several lifestyle modifications today. We will check labs today. Eleazar will continue Crestor, and will continue to work on diet, exercise and weight loss efforts. Orders and follow up as documented in patient record.   - Lipid Panel With LDL/HDL Ratio  5. Other depression with emotional eating Behavior modification techniques were discussed today to help Steffon deal with his emotional/non-hunger eating behaviors. Cloud was offered support and encouragement. We will refill Wellbutrin SR for 90 days with no refills. Orders and follow up as documented in patient record.   - buPROPion (WELLBUTRIN SR) 200 MG 12 hr tablet; Take 1 tablet (200 mg total) by mouth every morning.  Dispense: 90 tablet; Refill: 0  6. Class 3 severe obesity with serious comorbidity and body mass index (BMI) of 40.0 to 44.9 in adult, unspecified obesity type [facility]) Lyrick is currently in the action stage of change. As such, his goal is to  continue with weight loss efforts. He has agreed to keeping a food journal and adhering to recommended goals of 1500-1800 calories and 100+ grams of protein daily.   Behavioral modification strategies: meal planning and cooking strategies and emotional eating strategies.  Javi has agreed to follow-up with our clinic in 2 weeks. He was informed of the importance of frequent follow-up visits to maximize his success with intensive lifestyle modifications for his multiple health conditions.   Mayur was informed we would discuss his lab results at his next visit unless there is a critical issue that needs to be addressed sooner. Jethro agreed to keep his next visit at the agreed upon time to discuss these results.  Objective:   Blood pressure 134/66, pulse 60, temperature 97.9 F (36.6 C), height 6\' 1"  (1.854 m), weight (!) 307 lb (139.3 kg), SpO2 97 %. Body mass index is 40.5 kg/m.  General: Cooperative, alert, well developed, in no acute distress. HEENT: Conjunctivae and lids unremarkable. Cardiovascular: Regular rhythm.  Lungs: Normal work of breathing. Neurologic: No focal deficits.   Lab Results  Component Value Date   CREATININE 1.28 (H) 09/16/2020   BUN 19 09/16/2020   NA 136 09/16/2020   K 4.0 09/16/2020   CL 100 09/16/2020   CO2 21 09/16/2020   Lab Results  Component Value Date   ALT 23 09/16/2020   AST 17 09/16/2020   ALKPHOS 53 09/16/2020   BILITOT 0.6 09/16/2020   Lab Results  Component Value Date   HGBA1C 6.6 (H) 09/16/2020   HGBA1C 6.1 11/28/2019   HGBA1C 6.0 (H) 08/14/2019   HGBA1C 6.0 (H) 03/25/2019   HGBA1C 6.2 12/17/2018   Lab Results  Component Value Date   INSULIN 9.1 09/16/2020   INSULIN 10.0 08/14/2019   INSULIN 12.4 03/25/2019   INSULIN 19.2 09/24/2018   INSULIN 13.7 05/03/2018   Lab Results  Component Value Date   TSH 1.500 03/25/2019   Lab Results  Component Value Date   CHOL 167 09/16/2020   HDL 42 09/16/2020   LDLCALC 105 (H)  09/16/2020   LDLDIRECT 148 (H) 11/15/2007   TRIG 111 09/16/2020   CHOLHDL 5.2 (H) 01/26/2016   Lab Results  Component Value Date   WBC 7.3 11/30/2017   HGB 15.0 11/30/2017   HCT 46.8 11/30/2017   MCV 86 11/30/2017   PLT 293 11/30/2017   No results found for: IRON, TIBC, FERRITIN  Obesity Behavioral Intervention:   Approximately 15 minutes were spent on the discussion below.  ASK: We discussed the diagnosis of obesity with [name] today and [place]o agreed to give [name] permission to discuss obesity behavioral modification therapy today.  ASSESS: Jatavious has the diagnosis of obesity and his BMI today is 40.51. Audel is in the action stage of change.   ADVISE: Farley was educated on the multiple health risks of obesity as well as the benefit of weight loss to improve his health. He was advised of the need for long term treatment and the importance of lifestyle modifications to improve his current health and to decrease his risk of future health problems.  AGREE: Multiple dietary modification options and treatment options were discussed and Brier agreed to follow the recommendations documented in the above note.  ARRANGE: Xian was educated on the importance of frequent visits to treat obesity as outlined per CMS and USPSTF guidelines and agreed to schedule his next follow up appointment today.  Attestation Statements:   Reviewed by clinician on day of visit: allergies, medications, problem list, medical history, surgical history, family history, social history, and previous encounter notes.   I, Trixie Dredge, am acting as transcriptionist for [name], MD.  I have reviewed the above documentation for accuracy and completeness, and I agree with the above. -  [name], MD

## 2020-09-22 NOTE — Progress Notes (Signed)
Subjective:    CC: B hand pain, L>R  I, Molly Weber, LAT, ATC, am serving as scribe for Dr. Lynne Leader.  Patient referred by Dr. Leafy Ro.  HPI: Pt is a 75 y/o male presenting w/ c/o B hand pain (L>R) since Sept/Oct 2021 after helping his brother move when his L hand was hit by a door.  He notes that pain resolved but then in January 2022 he noticed that his "hands won't work" in the morning, stating that his hands feel swollen.  He is also reporting generalized joint pain in his knees and ankles.  He locates his pain to his B fingers.  He has a family hx of RA.  Hand swelling: yes in the sense that they feel swollen but don't look swollen Hand numbness/tingling: yes, tingling Aggravating factors: gripping Treatments tried: OTC NSAIDs  Pertinent review of Systems: No fevers or chills  Relevant historical information: CHF, hypertension, sleep apnea, diabetes   Objective:    Vitals:   09/23/20 0746  BP: 120/80  Pulse: 62  SpO2: 98%   General: Well Developed, well nourished, and in no acute distress.   MSK: Hands bilaterally slight swelling at MCPs throughout.  No ulnar deviation. Small nodule right dorsal fourth PIP.  Nontender. Hand motion slight decreased full flexion otherwise normal. Strength intact. Pulses cap refill and sensation are intact distally. Not particularly tender.  Lab and Radiology Results  Diagnostic Limited MSK Ultrasound of: Right fourth PIP dorsal Small ganglion cyst measuring less than 2 mm dorsal to PIP Impression: Ganglion cyst dorsal PIP  X-ray images hands bilaterally obtained today personally and independently interpreted  Right hand: No erosions at MCPs.  Mild OA DIPs and PIPs.  Left hand: No erosions at MCPs.  Mild OA DIPs and PIPs.  Await formal radiology review   Impression and Recommendations:    Assessment and Plan: 75 y.o. male with hand stiffness and pain bilaterally.  Pain mostly due to osteoarthritis however there  may be a component of rheumatologic disease.  We will proceed with bilateral hand x-ray as well as rheumatologic work-up.    Start typical conservative treatment with hand therapy, home exercise program, Voltaren gel, and and heat.  Recheck back in 6 weeks.  If rheumatologic work-up significantly positive will refer to rheumatology as well.    PDMP not reviewed this encounter. Orders Placed This Encounter  Procedures  . [name] LIMITED JOINT SPACE STRUCTURES UP BILAT(NO LINKED CHARGES)    Order Specific Question:   Reason for Exam (SYMPTOM  OR DIAGNOSIS REQUIRED)    Answer:   B hand pain    Order Specific Question:   Preferred imaging location?    Answer:   [facility]  . DG Hand Complete Right    Standing Status:   Future    Standing Expiration Date:   09/23/2021    Order Specific Question:   Reason for Exam (SYMPTOM  OR DIAGNOSIS REQUIRED)    Answer:   eval hand pain ?RA vs OA    Order Specific Question:   Preferred imaging location?    Answer:   [facility]  . DG Hand Complete Left    Standing Status:   Future    Standing Expiration Date:   09/23/2021    Order Specific Question:   Reason for Exam (SYMPTOM  OR DIAGNOSIS REQUIRED)    Answer:   eval hand pain Ra vs OA    Order Specific Question:   Preferred imaging  location?    Answer:   [facility]  . Sedimentation rate    Standing Status:   Future    Standing Expiration Date:   09/23/2021  . Rheumatoid factor    Standing Status:   Future    Standing Expiration Date:   09/23/2021  . Cyclic citrul peptide antibody, IgG    Standing Status:   Future    Standing Expiration Date:   09/23/2021  . ANA    Standing Status:   Future    Standing Expiration Date:   09/23/2021  . Uric acid    Standing Status:   Future    Standing Expiration Date:   09/23/2021  . Ambulatory referral to Physical Therapy    Referral Priority:   Routine    Referral Type:   Physical Medicine    Referral Reason:   Specialty  Services Required    Requested Specialty:   Physical Therapy    Number of Visits Requested:   1   No orders of the defined types were placed in this encounter.   Discussed warning signs or symptoms. Please see discharge instructions. Patient expresses understanding.   The above documentation has been reviewed and is accurate and complete Lynne Leader, M.D.  Cc: Dr. Leafy Ro and PCP.

## 2020-09-23 ENCOUNTER — Ambulatory Visit: Payer: Medicare HMO | Admitting: Family Medicine

## 2020-09-23 ENCOUNTER — Ambulatory Visit (INDEPENDENT_AMBULATORY_CARE_PROVIDER_SITE_OTHER): Payer: Medicare HMO

## 2020-09-23 ENCOUNTER — Other Ambulatory Visit: Payer: Self-pay

## 2020-09-23 ENCOUNTER — Encounter: Payer: Self-pay | Admitting: Family Medicine

## 2020-09-23 ENCOUNTER — Ambulatory Visit: Payer: Self-pay

## 2020-09-23 VITALS — BP 120/80 | HR 62 | Ht 73.0 in | Wt 313.0 lb

## 2020-09-23 DIAGNOSIS — M67449 Ganglion, unspecified hand: Secondary | ICD-10-CM

## 2020-09-23 DIAGNOSIS — M79641 Pain in right hand: Secondary | ICD-10-CM | POA: Diagnosis not present

## 2020-09-23 DIAGNOSIS — M7989 Other specified soft tissue disorders: Secondary | ICD-10-CM | POA: Diagnosis not present

## 2020-09-23 DIAGNOSIS — M79642 Pain in left hand: Secondary | ICD-10-CM | POA: Diagnosis not present

## 2020-09-23 DIAGNOSIS — M19042 Primary osteoarthritis, left hand: Secondary | ICD-10-CM | POA: Diagnosis not present

## 2020-09-23 DIAGNOSIS — M19041 Primary osteoarthritis, right hand: Secondary | ICD-10-CM | POA: Diagnosis not present

## 2020-09-23 LAB — SEDIMENTATION RATE: Sed Rate: 10 mm/hr (ref 0–20)

## 2020-09-23 LAB — URIC ACID: Uric Acid, Serum: 7.7 mg/dL (ref 4.0–7.8)

## 2020-09-23 NOTE — Patient Instructions (Signed)
Thank you for coming in today.  I've referred you to Physical Therapy.  Let us know if you don't hear from them in one week.  Please get an Xray today before you leave  Please get labs today before you leave  Please use voltaren gel up to 4x daily for pain as needed.   Heat can help a lot.   Home exercises can help. Modeling clay pinch, push, pull roll.  Recheck in about 6 weeks.

## 2020-09-24 NOTE — Patient Instructions (Addendum)
Please continue using your CPAP regularly. While your insurance requires that you use CPAP at least 4 hours each night on 70% of the nights, I recommend, that you not skip any nights and use it throughout the night if you can. Getting used to CPAP and staying with the treatment long term does take time and patience and discipline. Untreated obstructive sleep apnea when it is moderate to severe can have an adverse impact on cardiovascular health and raise her risk for heart disease, arrhythmias, hypertension, congestive heart failure, stroke and diabetes. Untreated obstructive sleep apnea causes sleep disruption, nonrestorative sleep, and sleep deprivation. This can have an impact on your day to day functioning and cause daytime sleepiness and impairment of cognitive function, memory loss, mood disturbance, and problems focussing. Using CPAP regularly can improve these symptoms.   I will recheck compliance in 6-8 weeks. Assuming you are doing well, we will follow up in 1 year   Sleep Apnea Sleep apnea affects breathing during sleep. It causes breathing to stop for a short time or to become shallow. It can also increase the risk of:  Heart attack.  Stroke.  Being very overweight (obese).  Diabetes.  Heart failure.  Irregular heartbeat. The goal of treatment is to help you breathe normally again. What are the causes? There are three kinds of sleep apnea:  Obstructive sleep apnea. This is caused by a blocked or collapsed airway.  Central sleep apnea. This happens when the brain does not send the right signals to the muscles that control breathing.  Mixed sleep apnea. This is a combination of obstructive and central sleep apnea. The most common cause of this condition is a collapsed or blocked airway. This can happen if:  Your throat muscles are too relaxed.  Your tongue and tonsils are too large.  You are overweight.  Your airway is too small.   What increases the risk?  Being  overweight.  Smoking.  Having a small airway.  Being older.  Being male.  Drinking alcohol.  Taking medicines to calm yourself (sedatives or tranquilizers).  Having family members with the condition. What are the signs or symptoms?  Trouble staying asleep.  Being sleepy or tired during the day.  Getting angry a lot.  Loud snoring.  Headaches in the morning.  Not being able to focus your mind (concentrate).  Forgetting things.  Less interest in sex.  Mood swings.  Personality changes.  Feelings of sadness (depression).  Waking up a lot during the night to pee (urinate).  Dry mouth.  Sore throat. How is this diagnosed?  Your medical history.  A physical exam.  A test that is done when you are sleeping (sleep study). The test is most often done in a sleep lab but may also be done at home. How is this treated?  Sleeping on your side.  Using a medicine to get rid of mucus in your nose (decongestant).  Avoiding the use of alcohol, medicines to help you relax, or certain pain medicines (narcotics).  Losing weight, if needed.  Changing your diet.  Not smoking.  Using a machine to open your airway while you sleep, such as: ? An oral appliance. This is a mouthpiece that shifts your lower jaw forward. ? A CPAP device. This device blows air through a mask when you breathe out (exhale). ? An EPAP device. This has valves that you put in each nostril. ? A BPAP device. This device blows air through a mask when you breathe in (  inhale) and breathe out.  Having surgery if other treatments do not work. It is important to get treatment for sleep apnea. Without treatment, it can lead to:  High blood pressure.  Coronary artery disease.  In men, not being able to have an erection (impotence).  Reduced thinking ability.   Follow these instructions at home: Lifestyle  Make changes that your doctor recommends.  Eat a healthy diet.  Lose weight if  needed.  Avoid alcohol, medicines to help you relax, and some pain medicines.  Do not use any products that contain nicotine or tobacco, such as cigarettes, e-cigarettes, and chewing tobacco. If you need help quitting, ask your doctor. General instructions  Take over-the-counter and prescription medicines only as told by your doctor.  If you were given a machine to use while you sleep, use it only as told by your doctor.  If you are having surgery, make sure to tell your doctor you have sleep apnea. You may need to bring your device with you.  Keep all follow-up visits as told by your doctor. This is important. Contact a doctor if:  The machine that you were given to use during sleep bothers you or does not seem to be working.  You do not get better.  You get worse. Get help right away if:  Your chest hurts.  You have trouble breathing in enough air.  You have an uncomfortable feeling in your back, arms, or stomach.  You have trouble talking.  One side of your body feels weak.  A part of your face is hanging down. These symptoms may be an emergency. Do not wait to see if the symptoms will go away. Get medical help right away. Call your local emergency services (911 in the [place] Do not drive yourself to the hospital. Summary  This condition affects breathing during sleep.  The most common cause is a collapsed or blocked airway.  The goal of treatment is to help you breathe normally while you sleep. This information is not intended to replace advice given to you by your health care provider. Make sure you discuss any questions you have with your health care provider. Document Revised: 05/04/2018 Document Reviewed: 03/13/2018 Elsevier Patient Education  [place].

## 2020-09-24 NOTE — Progress Notes (Signed)
X-ray left hand shows mild symmetric osteoarthritis.  No evidence of rheumatoid arthritis present.

## 2020-09-24 NOTE — Progress Notes (Signed)
Uric acid is elevated indicating gout may be a possibility.  Sedimentation rate is normal.  Other labs are still pending
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
## 2020-09-24 NOTE — Progress Notes (Signed)
X-ray right hand no fracture or dislocation.  Mild symmetric osteoarthritis is present.  No rheumatoid arthritis changes are present.

## 2020-09-25 LAB — CYCLIC CITRUL PEPTIDE ANTIBODY, IGG: Cyclic Citrullin Peptide Ab: <16 UNITS

## 2020-09-25 LAB — RHEUMATOID FACTOR: Rheumatoid fact SerPl-aCnc: <14 IU/mL (ref <14)

## 2020-09-25 LAB — ANA: Anti Nuclear Antibody (ANA): NEGATIVE

## 2020-09-25 NOTE — Progress Notes (Signed)
The rest of the labs came back negative.  The only lab that was mildly elevated was uric acid.  I do not think your hand pain is due to gout at all.  I think her hand pain is due to osteoarthritis.  Hand physical therapy and the treatment that we discussed should be helpful.  Recheck back as scheduled.

## 2020-09-28 ENCOUNTER — Other Ambulatory Visit: Payer: Self-pay

## 2020-09-28 ENCOUNTER — Ambulatory Visit: Payer: Medicare HMO | Admitting: Family Medicine

## 2020-09-28 ENCOUNTER — Encounter: Payer: Self-pay | Admitting: Family Medicine

## 2020-09-28 VITALS — BP 141/83 | HR 65 | Ht 73.0 in | Wt 315.0 lb

## 2020-09-28 DIAGNOSIS — Z9989 Dependence on other enabling machines and devices: Secondary | ICD-10-CM | POA: Diagnosis not present

## 2020-09-28 DIAGNOSIS — G4733 Obstructive sleep apnea (adult) (pediatric): Secondary | ICD-10-CM

## 2020-09-28 NOTE — Progress Notes (Addendum)
Community message sent to [place] with [facility] regarding new CPAP orders.   Message received 5:51pm "Thank you, will process.   Janett Billow "

## 2020-09-30 ENCOUNTER — Ambulatory Visit (INDEPENDENT_AMBULATORY_CARE_PROVIDER_SITE_OTHER): Payer: Self-pay | Admitting: Family Medicine

## 2020-09-30 NOTE — Progress Notes (Signed)
I agree with the assessment and plan as directed by NP on this visit . The patient had variable CPAP complianc recently and was encouraged to use CPAP more routinely. It may help him to use CPAP already while reading in bed , perhaps adjusting his interface so he can use gl;asses with it? I was available for consultation.   DOHMEIER,CARMEN, MD

## 2020-10-01 DIAGNOSIS — M25642 Stiffness of left hand, not elsewhere classified: Secondary | ICD-10-CM | POA: Diagnosis not present

## 2020-10-01 DIAGNOSIS — M25632 Stiffness of left wrist, not elsewhere classified: Secondary | ICD-10-CM | POA: Diagnosis not present

## 2020-10-01 DIAGNOSIS — M79641 Pain in right hand: Secondary | ICD-10-CM | POA: Diagnosis not present

## 2020-10-01 DIAGNOSIS — M79642 Pain in left hand: Secondary | ICD-10-CM | POA: Diagnosis not present

## 2020-10-01 DIAGNOSIS — M25641 Stiffness of right hand, not elsewhere classified: Secondary | ICD-10-CM | POA: Diagnosis not present

## 2020-10-01 DIAGNOSIS — M25532 Pain in left wrist: Secondary | ICD-10-CM | POA: Diagnosis not present

## 2020-10-02 ENCOUNTER — Other Ambulatory Visit: Payer: Self-pay

## 2020-10-02 DIAGNOSIS — E1169 Type 2 diabetes mellitus with other specified complication: Secondary | ICD-10-CM

## 2020-10-02 MED ORDER — METFORMIN HCL 1000 MG PO TABS: 1000.0000 mg | ORAL_TABLET | Freq: Every day | ORAL | 0 refills | Status: DC

## 2020-10-02 MED ORDER — MIRABEGRON ER 50 MG PO TB24: 50.0000 mg | ORAL_TABLET | Freq: Every day | ORAL | 0 refills | Status: DC

## 2020-10-02 MED ORDER — LOSARTAN POTASSIUM 50 MG PO TABS: ORAL_TABLET | ORAL | 2 refills | Status: DC

## 2020-10-05 ENCOUNTER — Other Ambulatory Visit: Payer: Self-pay

## 2020-10-05 ENCOUNTER — Encounter (INDEPENDENT_AMBULATORY_CARE_PROVIDER_SITE_OTHER): Payer: Self-pay | Admitting: Family Medicine

## 2020-10-05 ENCOUNTER — Ambulatory Visit (INDEPENDENT_AMBULATORY_CARE_PROVIDER_SITE_OTHER): Payer: Medicare HMO | Admitting: Family Medicine

## 2020-10-05 VITALS — BP 121/73 | HR 65 | Temp 97.9°F | Ht 73.0 in | Wt 307.0 lb

## 2020-10-05 DIAGNOSIS — E1169 Type 2 diabetes mellitus with other specified complication: Secondary | ICD-10-CM | POA: Diagnosis not present

## 2020-10-05 DIAGNOSIS — I10 Essential (primary) hypertension: Secondary | ICD-10-CM | POA: Diagnosis not present

## 2020-10-05 DIAGNOSIS — M25642 Stiffness of left hand, not elsewhere classified: Secondary | ICD-10-CM | POA: Diagnosis not present

## 2020-10-05 DIAGNOSIS — Z6841 Body Mass Index (BMI) 40.0 and over, adult: Secondary | ICD-10-CM | POA: Diagnosis not present

## 2020-10-05 DIAGNOSIS — M25632 Stiffness of left wrist, not elsewhere classified: Secondary | ICD-10-CM | POA: Diagnosis not present

## 2020-10-05 DIAGNOSIS — M79641 Pain in right hand: Secondary | ICD-10-CM | POA: Diagnosis not present

## 2020-10-05 DIAGNOSIS — M25641 Stiffness of right hand, not elsewhere classified: Secondary | ICD-10-CM | POA: Diagnosis not present

## 2020-10-05 DIAGNOSIS — M25532 Pain in left wrist: Secondary | ICD-10-CM | POA: Diagnosis not present

## 2020-10-05 DIAGNOSIS — M79642 Pain in left hand: Secondary | ICD-10-CM | POA: Diagnosis not present

## 2020-10-06 NOTE — Progress Notes (Signed)
Chief Complaint:   OBESITY [name] [name] is here to discuss his progress with his obesity treatment plan along with follow-up of his obesity related diagnoses. [name] [name] is on keeping a food journal and adhering to recommended goals of 1500-1800 calories and 100+ grams of protein daily and states he is following his eating plan approximately 95% of the time. [name] [name] states he is walking for 15 minutes 6-7 times per week.  Today's visit was #: 63 Starting weight: 340 lbs Starting date: 12/28/2016 Today's weight: 307 lbs Today's date: 10/05/2020 Total lbs lost to date: 33 Total lbs lost since last in-office visit: 0  Interim History: [name] [name] continues to do well with increase lean protein and vegetables. He is starting physical therapy ordered by Dr. Max Sane.  Subjective:   1. Type 2 diabetes mellitus with other specified complication, without long-term current use of insulin (HCC) [name] [name]'s fasting BGs mostly range between 120 and 140's. He is doing well with minimizing simple carbohydrates.  2. Essential hypertension [name] [name]'s blood pressure is at goal on medications, and he denies signs of hypotension. He does tend to retain fluid in his hands and he is going to physical therapy soon.  Assessment/Plan:   1. Type 2 diabetes mellitus with other specified complication, without long-term current use of insulin (HCC) Good blood sugar control is important to decrease the likelihood of diabetic complications such as nephropathy, neuropathy, limb loss, blindness, coronary artery disease, and death. Intensive lifestyle modification including diet, exercise and weight loss are the first line of treatment for diabetes. We will continue to manage his glucose with diet, exercise, and medications. We will recheck labs in 1-2 months.  2. Essential hypertension [name] [name] is to continue working on diet, healthy weight loss, and exercise, and decreasing Na+ to improve blood pressure control. We will watch for signs of  hypotension as he continues his lifestyle modifications.  3. Class 3 severe obesity with serious comorbidity and body mass index (BMI) of 40.0 to 44.9 in adult, unspecified obesity type [name] [name]) [name] [name] is currently in the action stage of change. As such, his goal is to continue with weight loss efforts. He has agreed to keeping a food journal and adhering to recommended goals of 1500-1800 calories and 100+ grams of protein daily.   Exercise goals: As is.  Behavioral modification strategies: increasing lean protein intake and meal planning and cooking strategies.  [name] [name] has agreed to follow-up with our [name] in 2 to 3 weeks. He was informed of the importance of frequent follow-up visits to maximize his success with intensive lifestyle modifications for his multiple health conditions.   Objective:   Blood pressure 121/73, pulse 65, temperature 97.9 F (36.6 C), temperature source Oral, height 6\' 1"  (1.854 m), weight (!) 307 lb (139.3 kg), SpO2 98 %. Body mass index is 40.5 kg/m.  General: Cooperative, alert, well developed, in no acute distress. HEENT: Conjunctivae and lids unremarkable. Cardiovascular: Regular rhythm.  Lungs: Normal work of breathing. Neurologic: No focal deficits.   Lab Results  Component Value Date   CREATININE 1.28 (H) 09/16/2020   BUN 19 09/16/2020   NA 136 09/16/2020   K 4.0 09/16/2020   CL 100 09/16/2020   CO2 21 09/16/2020   Lab Results  Component Value Date   ALT 23 09/16/2020   AST 17 09/16/2020   ALKPHOS 53 09/16/2020   BILITOT 0.6 09/16/2020   Lab Results  Component Value Date   HGBA1C 6.6 (H) 09/16/2020   HGBA1C 6.1 11/28/2019   HGBA1C  6.0 (H) 08/14/2019   HGBA1C 6.0 (H) 03/25/2019   HGBA1C 6.2 12/17/2018   Lab Results  Component Value Date   INSULIN 9.1 09/16/2020   INSULIN 10.0 08/14/2019   INSULIN 12.4 03/25/2019   INSULIN 19.2 09/24/2018   INSULIN 13.7 05/03/2018   Lab Results  Component Value Date   TSH 1.500 03/25/2019   Lab  Results  Component Value Date   CHOL 167 09/16/2020   HDL 42 09/16/2020   LDLCALC 105 (H) 09/16/2020   LDLDIRECT 148 (H) 11/15/2007   TRIG 111 09/16/2020   CHOLHDL 5.2 (H) 01/26/2016   Lab Results  Component Value Date   WBC 7.3 11/30/2017   HGB 15.0 11/30/2017   HCT 46.8 11/30/2017   MCV 86 11/30/2017   PLT 293 11/30/2017   No results found for: IRON, TIBC, FERRITIN  Obesity Behavioral Intervention:   Approximately 15 minutes were spent on the discussion below.  ASK: We discussed the diagnosis of obesity with [name] [name] today and [name] [name] agreed to give [name] permission to discuss obesity behavioral modification therapy today.  ASSESS: [name] [name] has the diagnosis of obesity and his BMI today is 40.51. [name] [name] is in the action stage of change.   ADVISE: [name] [name] was educated on the multiple health risks of obesity as well as the benefit of weight loss to improve his health. He was advised of the need for long term treatment and the importance of lifestyle modifications to improve his current health and to decrease his risk of future health problems.  AGREE: Multiple dietary modification options and treatment options were discussed and [name] [name] agreed to follow the recommendations documented in the above note.  ARRANGE: [name] [name] was educated on the importance of frequent visits to treat obesity as outlined per CMS and USPSTF guidelines and agreed to schedule his next follow up appointment today.  Attestation Statements:   Reviewed by clinician on day of visit: allergies, medications, problem list, medical history, surgical history, family history, social history, and previous encounter notes.   I, Trixie Dredge, am acting as transcriptionist for [name], MD.  I have reviewed the above documentation for accuracy and completeness, and I agree with the above. -  [name], MD

## 2020-10-07 DIAGNOSIS — M79642 Pain in left hand: Secondary | ICD-10-CM | POA: Diagnosis not present

## 2020-10-07 DIAGNOSIS — M25532 Pain in left wrist: Secondary | ICD-10-CM | POA: Diagnosis not present

## 2020-10-07 DIAGNOSIS — M25641 Stiffness of right hand, not elsewhere classified: Secondary | ICD-10-CM | POA: Diagnosis not present

## 2020-10-07 DIAGNOSIS — M25632 Stiffness of left wrist, not elsewhere classified: Secondary | ICD-10-CM | POA: Diagnosis not present

## 2020-10-07 DIAGNOSIS — M79641 Pain in right hand: Secondary | ICD-10-CM | POA: Diagnosis not present

## 2020-10-07 DIAGNOSIS — M25642 Stiffness of left hand, not elsewhere classified: Secondary | ICD-10-CM | POA: Diagnosis not present

## 2020-10-08 DIAGNOSIS — M79642 Pain in left hand: Secondary | ICD-10-CM | POA: Diagnosis not present

## 2020-10-08 DIAGNOSIS — M79641 Pain in right hand: Secondary | ICD-10-CM | POA: Diagnosis not present

## 2020-10-08 DIAGNOSIS — M25532 Pain in left wrist: Secondary | ICD-10-CM | POA: Diagnosis not present

## 2020-10-08 DIAGNOSIS — M25632 Stiffness of left wrist, not elsewhere classified: Secondary | ICD-10-CM | POA: Diagnosis not present

## 2020-10-08 DIAGNOSIS — M25641 Stiffness of right hand, not elsewhere classified: Secondary | ICD-10-CM | POA: Diagnosis not present

## 2020-10-08 DIAGNOSIS — M25642 Stiffness of left hand, not elsewhere classified: Secondary | ICD-10-CM | POA: Diagnosis not present

## 2020-10-10 ENCOUNTER — Other Ambulatory Visit (INDEPENDENT_AMBULATORY_CARE_PROVIDER_SITE_OTHER): Payer: Self-pay | Admitting: Family Medicine

## 2020-10-10 DIAGNOSIS — I10 Essential (primary) hypertension: Secondary | ICD-10-CM

## 2020-10-12 NOTE — Telephone Encounter (Signed)
Last OV with Dr Wallace 

## 2020-10-12 NOTE — Telephone Encounter (Signed)
Last seen by Beasley  

## 2020-10-13 ENCOUNTER — Encounter (INDEPENDENT_AMBULATORY_CARE_PROVIDER_SITE_OTHER): Payer: Self-pay

## 2020-10-13 DIAGNOSIS — M79642 Pain in left hand: Secondary | ICD-10-CM | POA: Diagnosis not present

## 2020-10-13 DIAGNOSIS — M25641 Stiffness of right hand, not elsewhere classified: Secondary | ICD-10-CM | POA: Diagnosis not present

## 2020-10-13 DIAGNOSIS — M25632 Stiffness of left wrist, not elsewhere classified: Secondary | ICD-10-CM | POA: Diagnosis not present

## 2020-10-13 DIAGNOSIS — M25532 Pain in left wrist: Secondary | ICD-10-CM | POA: Diagnosis not present

## 2020-10-13 DIAGNOSIS — M25642 Stiffness of left hand, not elsewhere classified: Secondary | ICD-10-CM | POA: Diagnosis not present

## 2020-10-13 DIAGNOSIS — M79641 Pain in right hand: Secondary | ICD-10-CM | POA: Diagnosis not present

## 2020-10-13 NOTE — Telephone Encounter (Signed)
MyChart message sent to pt to find out if they have enough medication to get them through until next appt.   

## 2020-10-15 DIAGNOSIS — M79641 Pain in right hand: Secondary | ICD-10-CM | POA: Diagnosis not present

## 2020-10-15 DIAGNOSIS — M25532 Pain in left wrist: Secondary | ICD-10-CM | POA: Diagnosis not present

## 2020-10-15 DIAGNOSIS — M79642 Pain in left hand: Secondary | ICD-10-CM | POA: Diagnosis not present

## 2020-10-15 DIAGNOSIS — M25641 Stiffness of right hand, not elsewhere classified: Secondary | ICD-10-CM | POA: Diagnosis not present

## 2020-10-15 DIAGNOSIS — M25642 Stiffness of left hand, not elsewhere classified: Secondary | ICD-10-CM | POA: Diagnosis not present

## 2020-10-15 DIAGNOSIS — M25632 Stiffness of left wrist, not elsewhere classified: Secondary | ICD-10-CM | POA: Diagnosis not present

## 2020-10-19 ENCOUNTER — Ambulatory Visit (INDEPENDENT_AMBULATORY_CARE_PROVIDER_SITE_OTHER): Payer: Medicare HMO | Admitting: Family Medicine

## 2020-10-20 DIAGNOSIS — M25632 Stiffness of left wrist, not elsewhere classified: Secondary | ICD-10-CM | POA: Diagnosis not present

## 2020-10-20 DIAGNOSIS — M79642 Pain in left hand: Secondary | ICD-10-CM | POA: Diagnosis not present

## 2020-10-20 DIAGNOSIS — M25532 Pain in left wrist: Secondary | ICD-10-CM | POA: Diagnosis not present

## 2020-10-20 DIAGNOSIS — M25642 Stiffness of left hand, not elsewhere classified: Secondary | ICD-10-CM | POA: Diagnosis not present

## 2020-10-20 DIAGNOSIS — M79641 Pain in right hand: Secondary | ICD-10-CM | POA: Diagnosis not present

## 2020-10-20 DIAGNOSIS — M25641 Stiffness of right hand, not elsewhere classified: Secondary | ICD-10-CM | POA: Diagnosis not present

## 2020-10-22 DIAGNOSIS — M25641 Stiffness of right hand, not elsewhere classified: Secondary | ICD-10-CM | POA: Diagnosis not present

## 2020-10-22 DIAGNOSIS — M25642 Stiffness of left hand, not elsewhere classified: Secondary | ICD-10-CM | POA: Diagnosis not present

## 2020-10-22 DIAGNOSIS — M25632 Stiffness of left wrist, not elsewhere classified: Secondary | ICD-10-CM | POA: Diagnosis not present

## 2020-10-22 DIAGNOSIS — M25532 Pain in left wrist: Secondary | ICD-10-CM | POA: Diagnosis not present

## 2020-10-22 DIAGNOSIS — M79641 Pain in right hand: Secondary | ICD-10-CM | POA: Diagnosis not present

## 2020-10-22 DIAGNOSIS — M79642 Pain in left hand: Secondary | ICD-10-CM | POA: Diagnosis not present

## 2020-10-27 DIAGNOSIS — M79642 Pain in left hand: Secondary | ICD-10-CM | POA: Diagnosis not present

## 2020-10-27 DIAGNOSIS — M25641 Stiffness of right hand, not elsewhere classified: Secondary | ICD-10-CM | POA: Diagnosis not present

## 2020-10-27 DIAGNOSIS — M25632 Stiffness of left wrist, not elsewhere classified: Secondary | ICD-10-CM | POA: Diagnosis not present

## 2020-10-27 DIAGNOSIS — M25642 Stiffness of left hand, not elsewhere classified: Secondary | ICD-10-CM | POA: Diagnosis not present

## 2020-10-27 DIAGNOSIS — M79641 Pain in right hand: Secondary | ICD-10-CM | POA: Diagnosis not present

## 2020-10-27 DIAGNOSIS — M25532 Pain in left wrist: Secondary | ICD-10-CM | POA: Diagnosis not present

## 2020-10-29 ENCOUNTER — Encounter (INDEPENDENT_AMBULATORY_CARE_PROVIDER_SITE_OTHER): Payer: Self-pay | Admitting: Family Medicine

## 2020-10-29 ENCOUNTER — Other Ambulatory Visit: Payer: Self-pay

## 2020-10-29 ENCOUNTER — Ambulatory Visit (INDEPENDENT_AMBULATORY_CARE_PROVIDER_SITE_OTHER): Payer: Medicare HMO | Admitting: Family Medicine

## 2020-10-29 VITALS — BP 120/68 | HR 67 | Temp 98.0°F | Ht 73.0 in | Wt 308.0 lb

## 2020-10-29 DIAGNOSIS — Z6839 Body mass index (BMI) 39.0-39.9, adult: Secondary | ICD-10-CM

## 2020-10-29 DIAGNOSIS — M79641 Pain in right hand: Secondary | ICD-10-CM | POA: Diagnosis not present

## 2020-10-29 DIAGNOSIS — M25642 Stiffness of left hand, not elsewhere classified: Secondary | ICD-10-CM | POA: Diagnosis not present

## 2020-10-29 DIAGNOSIS — M79642 Pain in left hand: Secondary | ICD-10-CM | POA: Diagnosis not present

## 2020-10-29 DIAGNOSIS — I10 Essential (primary) hypertension: Secondary | ICD-10-CM

## 2020-10-29 DIAGNOSIS — E1169 Type 2 diabetes mellitus with other specified complication: Secondary | ICD-10-CM | POA: Diagnosis not present

## 2020-10-29 DIAGNOSIS — M25641 Stiffness of right hand, not elsewhere classified: Secondary | ICD-10-CM | POA: Diagnosis not present

## 2020-10-29 DIAGNOSIS — M25532 Pain in left wrist: Secondary | ICD-10-CM | POA: Diagnosis not present

## 2020-10-29 DIAGNOSIS — M25632 Stiffness of left wrist, not elsewhere classified: Secondary | ICD-10-CM | POA: Diagnosis not present

## 2020-10-29 MED ORDER — METOPROLOL SUCCINATE ER 25 MG PO TB24: 25.0000 mg | ORAL_TABLET | Freq: Every day | ORAL | 0 refills | Status: DC

## 2020-10-30 ENCOUNTER — Other Ambulatory Visit: Payer: Self-pay

## 2020-10-30 ENCOUNTER — Encounter: Payer: Self-pay | Admitting: Family Medicine

## 2020-10-30 ENCOUNTER — Ambulatory Visit: Payer: Medicare HMO | Admitting: Family Medicine

## 2020-10-30 DIAGNOSIS — E7849 Other hyperlipidemia: Secondary | ICD-10-CM

## 2020-10-30 DIAGNOSIS — E119 Type 2 diabetes mellitus without complications: Secondary | ICD-10-CM

## 2020-10-30 DIAGNOSIS — F3289 Other specified depressive episodes: Secondary | ICD-10-CM | POA: Diagnosis not present

## 2020-10-30 DIAGNOSIS — I10 Essential (primary) hypertension: Secondary | ICD-10-CM

## 2020-10-30 DIAGNOSIS — I509 Heart failure, unspecified: Secondary | ICD-10-CM | POA: Diagnosis not present

## 2020-10-30 DIAGNOSIS — I110 Hypertensive heart disease with heart failure: Secondary | ICD-10-CM | POA: Diagnosis not present

## 2020-10-30 MED ORDER — ROSUVASTATIN CALCIUM 40 MG PO TABS: ORAL_TABLET | ORAL | 2 refills | Status: DC

## 2020-10-30 MED ORDER — ESCITALOPRAM OXALATE 20 MG PO TABS: 20.0000 mg | ORAL_TABLET | Freq: Every day | ORAL | 2 refills | Status: DC

## 2020-10-30 MED ORDER — METOPROLOL SUCCINATE ER 25 MG PO TB24: 12.5000 mg | ORAL_TABLET | Freq: Every day | ORAL | 0 refills | Status: DC

## 2020-10-30 NOTE — Assessment & Plan Note (Signed)
Patient sees the healthy weight and wellness clinic.  A1c was checked in February of this year and was 6.6.  We will not recheck because it has been less than 3 months since his previous hemoglobin A1c check.  We will continue diabetes management as prescribed and the patient will continue to follow with a healthy weight loss clinic.

## 2020-10-30 NOTE — Patient Instructions (Signed)
It was great seeing you today.  I am glad you are doing so well.  He recently had lab work done through there is no reason for [name] to repeat the lab work today.  Your heart and lungs sound wonderful and you have no swelling in your legs which is great.  I refilled your medications with you need refills on.  If you get home and cannot find the medications that you are missing on your med list please let me know and I can refill them.  If you have any questions or concerns please call the clinic.  Hope you have a wonderful afternoon!

## 2020-10-30 NOTE — Progress Notes (Signed)
    SUBJECTIVE:   CHIEF COMPLAINT / HPI:   HTN  Patient reports that he takes his blood pressures daily and they have been well controlled.  He is currently on metoprolol 12.5 daily and furosemide 40 mg daily.  There is also a prescription for hydrochlorothiazide but he reports that he brought all of his medications and that medication is not there.  He thinks he may have been taken off of it but will go home and check.  I am not refilling that medication because his blood pressure is very well controlled at this time.  Diabetes  Well controlled.  Reports blood sugars ranging from 110s to 140s in the morning.  Patient compliant with his Metformin.  He had his hemoglobin A1c checked by another one of his providers.  Most recent hemoglobin A1c was in February.  PERTINENT  PMH / PSH: Diabetes, CHF, CAD, hypertension  OBJECTIVE:   BP 122/64   Pulse 65   Ht 6\' 1"  (1.854 m)   Wt (!) 316 lb (143.3 kg)   SpO2 96%   BMI 41.69 kg/m   General: Well-appearing 75 year old male, obese, no acute distress Cardiac: Regular rate and rhythm, no murmurs appreciated Respiratory: Normal work of breathing, lungs clear to auscultation bilaterally Abdomen: Soft, nontender, positive bowel sounds Extremities: No lower extremity edema noted, gross defect in right lower extremity from previous injury where he had multiple surgeries but no edema noted around the defect.  ASSESSMENT/PLAN:   Essential hypertension Currently well controlled on Toprol, losartan, Lasix.  Patient has hydrochlorothiazide on nevus but is unsure if he is actually taking it because he does not have his medication with him. -We will continue to monitor  Diabetes mellitus [facility]) Patient sees the healthy weight and wellness clinic.  A1c was checked in February of this year and was 6.6.  We will not recheck because it has been less than 3 months since his previous hemoglobin A1c check.  We will continue diabetes management as prescribed and  the patient will continue to follow with a healthy weight loss clinic.      Gifford Shave, MD [place]

## 2020-10-30 NOTE — Assessment & Plan Note (Signed)
Currently well controlled on Toprol, losartan, Lasix.  Patient has hydrochlorothiazide on nevus but is unsure if he is actually taking it because he does not have his medication with him. -We will continue to monitor

## 2020-11-02 NOTE — Progress Notes (Deleted)
   I, [name], LAT, ATC, am serving as scribe for Dr. Lynne Leader.  [name] [name] is a 75 y.o. male who presents to [place] at [facility] today for f/u of B hand pain, L>R.  He was last seen by Dr. Georgina Snell on 09/23/20 and had a lab workout for RA.  He was also referred to PT for hand therapy to [facility].  Since his last visit, pt reports  Diagnostic imaging: R and L hand XR- 09/23/20  Pertinent review of systems: ***  Relevant historical information: ***   Exam:  There were no vitals taken for this visit. General: Well Developed, well nourished, and in no acute distress.   MSK: ***    Lab and Radiology Results No results found for this or any previous visit (from the past 72 hour(s)). No results found.     Assessment and Plan: 75 y.o. male with ***   PDMP not reviewed this encounter. No orders of the defined types were placed in this encounter.  No orders of the defined types were placed in this encounter.    Discussed warning signs or symptoms. Please see discharge instructions. Patient expresses understanding.   ***

## 2020-11-03 ENCOUNTER — Ambulatory Visit: Payer: Medicare HMO | Admitting: Family Medicine

## 2020-11-04 NOTE — Progress Notes (Signed)
Chief Complaint:   OBESITY [name] [name] is here to discuss his progress with his obesity treatment plan along with follow-up of his obesity related diagnoses. [name] [name] is on keeping a food journal and adhering to recommended goals of 1500-1800 calories and 100+ grams of protein daily and states he is following his eating plan approximately 90-95% of the time. [name] [name] states he is walking for 15-20 minutes 5 times per week.  Today's visit was #: 43 Starting weight: 340 lbs Starting date: 12/28/2016 Today's weight: 308 lbs Today's date: 10/29/2020 Total lbs lost to date: 32 Total lbs lost since last in-office visit: 0  Interim History: [name] [name] is retaining some water weight today. He continues to lose fat and is increasing protein in his diet. He is working most days of the week for exercise.  Subjective:   1. Essential hypertension [name] [name]'s blood pressure is well controlled on medications, and with diet and weight loss.  2. Type 2 diabetes mellitus with other specified complication, without long-term current use of insulin (HCC) [name] [name]'s fasting BGs mostly range between 120 and 130's. He continues to work on diet and exercise.  Assessment/Plan:   1. Essential hypertension [name] [name] is working on healthy weight loss and exercise to improve blood pressure control. We will watch for signs of hypotension as he continues his lifestyle modifications. We will refill metoprolol 25 mg daily for 90 days with no refills.  2. Type 2 diabetes mellitus with other specified complication, without long-term current use of insulin (HCC) Good blood sugar control is important to decrease the likelihood of diabetic complications such as nephropathy, neuropathy, limb loss, blindness, coronary artery disease, and death. Intensive lifestyle modification including diet, exercise and weight loss are the first line of treatment for diabetes. [name] [name] will continue diet, exercise, and weight loss, and will continue to follow  up.  3. Obesity with current BMI of 40.7 [name] [name] is currently in the action stage of change. As such, his goal is to continue with weight loss efforts. He has agreed to keeping a food journal and adhering to recommended goals of 1800 calories and 100+ grams of protein daily.   Exercise goals: As is.  Behavioral modification strategies: increasing lean protein intake and meal planning and cooking strategies.  [name] [name] has agreed to follow-up with our clinic in 2 weeks. He was informed of the importance of frequent follow-up visits to maximize his success with intensive lifestyle modifications for his multiple health conditions.   Objective:   Blood pressure 120/68, pulse 67, temperature 98 F (36.7 C), height 6\' 1"  (1.854 m), weight (!) 308 lb (139.7 kg), SpO2 96 %. Body mass index is 40.64 kg/m.  General: Cooperative, alert, well developed, in no acute distress. HEENT: Conjunctivae and lids unremarkable. Cardiovascular: Regular rhythm.  Lungs: Normal work of breathing. Neurologic: No focal deficits.   Lab Results  Component Value Date   CREATININE 1.28 (H) 09/16/2020   BUN 19 09/16/2020   NA 136 09/16/2020   K 4.0 09/16/2020   CL 100 09/16/2020   CO2 21 09/16/2020   Lab Results  Component Value Date   ALT 23 09/16/2020   AST 17 09/16/2020   ALKPHOS 53 09/16/2020   BILITOT 0.6 09/16/2020   Lab Results  Component Value Date   HGBA1C 6.6 (H) 09/16/2020   HGBA1C 6.1 11/28/2019   HGBA1C 6.0 (H) 08/14/2019   HGBA1C 6.0 (H) 03/25/2019   HGBA1C 6.2 12/17/2018   Lab Results  Component Value Date   INSULIN 9.1 09/16/2020  INSULIN 10.0 08/14/2019   INSULIN 12.4 03/25/2019   INSULIN 19.2 09/24/2018   INSULIN 13.7 05/03/2018   Lab Results  Component Value Date   TSH 1.500 03/25/2019   Lab Results  Component Value Date   CHOL 167 09/16/2020   HDL 42 09/16/2020   LDLCALC 105 (H) 09/16/2020   LDLDIRECT 148 (H) 11/15/2007   TRIG 111 09/16/2020   CHOLHDL 5.2 (H)  01/26/2016   Lab Results  Component Value Date   WBC 7.3 11/30/2017   HGB 15.0 11/30/2017   HCT 46.8 11/30/2017   MCV 86 11/30/2017   PLT 293 11/30/2017   No results found for: IRON, TIBC, FERRITIN  Obesity Behavioral Intervention:   Approximately 15 minutes were spent on the discussion below.  ASK: We discussed the diagnosis of obesity with [name] [name] today and [name] [name] agreed to give [name] permission to discuss obesity behavioral modification therapy today.  ASSESS: [name] [name] has the diagnosis of obesity and his BMI today is 40.64. [name] [name] is in the action stage of change.   ADVISE: [name] [name] was educated on the multiple health risks of obesity as well as the benefit of weight loss to improve his health. He was advised of the need for long term treatment and the importance of lifestyle modifications to improve his current health and to decrease his risk of future health problems.  AGREE: Multiple dietary modification options and treatment options were discussed and [name] [name] agreed to follow the recommendations documented in the above note.  ARRANGE: [name] [name] was educated on the importance of frequent visits to treat obesity as outlined per CMS and USPSTF guidelines and agreed to schedule his next follow up appointment today.  Attestation Statements:   Reviewed by clinician on day of visit: allergies, medications, problem list, medical history, surgical history, family history, social history, and previous encounter notes.   I, [name] [name], am acting as transcriptionist for [name], MD.  I have reviewed the above documentation for accuracy and completeness, and I agree with the above. -  [name], MD

## 2020-11-10 ENCOUNTER — Other Ambulatory Visit: Payer: Self-pay

## 2020-11-10 ENCOUNTER — Encounter (INDEPENDENT_AMBULATORY_CARE_PROVIDER_SITE_OTHER): Payer: Self-pay | Admitting: Family Medicine

## 2020-11-10 ENCOUNTER — Ambulatory Visit (INDEPENDENT_AMBULATORY_CARE_PROVIDER_SITE_OTHER): Payer: Medicare HMO | Admitting: Family Medicine

## 2020-11-10 VITALS — BP 136/69 | HR 62 | Temp 98.2°F | Ht 73.0 in | Wt 310.0 lb

## 2020-11-10 DIAGNOSIS — E559 Vitamin D deficiency, unspecified: Secondary | ICD-10-CM | POA: Diagnosis not present

## 2020-11-10 DIAGNOSIS — H0102B Squamous blepharitis left eye, upper and lower eyelids: Secondary | ICD-10-CM | POA: Diagnosis not present

## 2020-11-10 DIAGNOSIS — Z6841 Body Mass Index (BMI) 40.0 and over, adult: Secondary | ICD-10-CM | POA: Diagnosis not present

## 2020-11-10 DIAGNOSIS — H0102A Squamous blepharitis right eye, upper and lower eyelids: Secondary | ICD-10-CM | POA: Diagnosis not present

## 2020-11-10 DIAGNOSIS — H02881 Meibomian gland dysfunction right upper eyelid: Secondary | ICD-10-CM | POA: Diagnosis not present

## 2020-11-10 DIAGNOSIS — E1169 Type 2 diabetes mellitus with other specified complication: Secondary | ICD-10-CM | POA: Diagnosis not present

## 2020-11-10 DIAGNOSIS — Z961 Presence of intraocular lens: Secondary | ICD-10-CM | POA: Diagnosis not present

## 2020-11-10 DIAGNOSIS — E119 Type 2 diabetes mellitus without complications: Secondary | ICD-10-CM | POA: Diagnosis not present

## 2020-11-10 DIAGNOSIS — H43811 Vitreous degeneration, right eye: Secondary | ICD-10-CM | POA: Diagnosis not present

## 2020-11-10 DIAGNOSIS — I10 Essential (primary) hypertension: Secondary | ICD-10-CM

## 2020-11-10 MED ORDER — VITAMIN D (ERGOCALCIFEROL) 1.25 MG (50000 UNIT) PO CAPS: 50000.0000 [IU] | ORAL_CAPSULE | ORAL | 0 refills | Status: DC

## 2020-11-10 MED ORDER — OZEMPIC (0.25 OR 0.5 MG/DOSE) 2 MG/1.5ML ~~LOC~~ SOPN: 0.2500 mg | PEN_INJECTOR | SUBCUTANEOUS | 0 refills | Status: DC

## 2020-11-17 ENCOUNTER — Telehealth: Payer: Self-pay | Admitting: Family Medicine

## 2020-11-17 ENCOUNTER — Encounter: Payer: Self-pay | Admitting: Family Medicine

## 2020-11-17 DIAGNOSIS — E119 Type 2 diabetes mellitus without complications: Secondary | ICD-10-CM | POA: Diagnosis not present

## 2020-11-17 DIAGNOSIS — H0102B Squamous blepharitis left eye, upper and lower eyelids: Secondary | ICD-10-CM | POA: Diagnosis not present

## 2020-11-17 DIAGNOSIS — H0102A Squamous blepharitis right eye, upper and lower eyelids: Secondary | ICD-10-CM | POA: Diagnosis not present

## 2020-11-17 DIAGNOSIS — H43811 Vitreous degeneration, right eye: Secondary | ICD-10-CM | POA: Diagnosis not present

## 2020-11-17 DIAGNOSIS — H02881 Meibomian gland dysfunction right upper eyelid: Secondary | ICD-10-CM | POA: Diagnosis not present

## 2020-11-17 DIAGNOSIS — Z961 Presence of intraocular lens: Secondary | ICD-10-CM | POA: Diagnosis not present

## 2020-11-17 NOTE — Telephone Encounter (Signed)
error 

## 2020-11-18 NOTE — Progress Notes (Signed)
Chief Complaint:   OBESITY [name] [name] is here to discuss his progress with his obesity treatment plan along with follow-up of his obesity related diagnoses. [name] [name] is on keeping a food journal and adhering to recommended goals of 1800 calories and 100+ grams of protein daily  and states he is following his eating plan approximately 98% of the time. [name] [name] states he is walking for 15-20 minutes 6 times per week.  Today's visit was #: 76 Starting weight: 340 lbs Starting date: 12/28/2016 Today's weight: 310 lbs Today's date: 11/10/2020 Total lbs lost to date: 30 Total lbs lost since last in-office visit: 0  Interim History: [name] [name] continues to work on [name] [name] Martin loss, but he is retaining a bit of fluid today. He is being mindful of his food choices and dealing with temptations well.  Subjective:   1. Type 2 diabetes mellitus with other specified complication, without long-term current use of insulin (HCC) [name] [name]'s fasting BGs range between 116-149, mostly 130's. He is working on diet and exercise, but he still has some polyphagia.  2. Vitamin D deficiency [name] [name] is stable on Vit D, and he denies signs of nausea, vomiting, or muscle weakness.  3. Essential hypertension [name] [name]'s blood pressure is controlled on his medications. He is not on hydrochlorothiazide but his blood pressure has been controlled off of it.  Assessment/Plan:   1. Type 2 diabetes mellitus with other specified complication, without long-term current use of insulin (HCC) [name] [name] blood sugar control is important to decrease the likelihood of diabetic complications such as nephropathy, neuropathy, limb loss, blindness, coronary artery disease, and death. Intensive lifestyle modification including diet, exercise and weight loss are the first line of treatment for diabetes. [name] [name] agreed to start Ozempic 0.25 mg q weekly with no refills, and he will continue metformin as is.  - Semaglutide,0.25 or 0.5MG /DOS, (OZEMPIC, 0.25 OR 0.5  MG/DOSE,) 2 MG/1.5ML SOPN; Inject 0.25 mg into the skin every 7 (seven) days.  Dispense: 1.5 mL; Refill: 0  2. Vitamin D deficiency Low Vitamin D level contributes to fatigue and are associated with obesity, breast, and colon cancer. We will refill prescription Vitamin D for 1 month. [name] [name] will follow-up for routine testing of Vitamin D, at least 2-3 times per year to avoid over-replacement.  - Vitamin D, Ergocalciferol, (DRISDOL) 1.25 MG (50000 UNIT) CAPS capsule; Take 1 capsule (50,000 Units total) by mouth every 14 (fourteen) days.  Dispense: 2 capsule; Refill: 0  3. Essential hypertension We will discontinue hydrochlorothiazide on [name] [name]'s medication list. He will continue working on healthy weight loss and exercise to improve blood pressure control. We will watch for signs of hypotension as he continues his lifestyle modifications.  4. Obesity with current BMI of 41.0 [name] [name] is currently in the action stage of change. As such, his goal is to continue with weight loss efforts. He has agreed to keeping a food journal and adhering to recommended goals of 1500-1800 calories and 100+ grams of protein daily.   Exercise goals: As is.  Behavioral modification strategies: increasing lean protein intake.  [name] [name] has agreed to follow-up with our clinic in 2 weeks. He was informed of the importance of frequent follow-up visits to maximize his success with intensive lifestyle modifications for his multiple health conditions.   Objective:   Blood pressure 136/69, pulse 62, temperature 98.2 F (36.8 C), height 6\' 1"  (1.854 m), weight (!) 310 lb (140.6 kg), SpO2 97 %. Body mass index is 40.9 kg/m.  General: Cooperative, alert, well developed, in  no acute distress. HEENT: Conjunctivae and lids unremarkable. Cardiovascular: Regular rhythm.  Lungs: Normal work of breathing. Neurologic: No focal deficits.   Lab Results  Component Value Date   CREATININE 1.28 (H) 09/16/2020   BUN 19 09/16/2020    NA 136 09/16/2020   K 4.0 09/16/2020   CL 100 09/16/2020   CO2 21 09/16/2020   Lab Results  Component Value Date   ALT 23 09/16/2020   AST 17 09/16/2020   ALKPHOS 53 09/16/2020   BILITOT 0.6 09/16/2020   Lab Results  Component Value Date   HGBA1C 6.6 (H) 09/16/2020   HGBA1C 6.1 11/28/2019   HGBA1C 6.0 (H) 08/14/2019   HGBA1C 6.0 (H) 03/25/2019   HGBA1C 6.2 12/17/2018   Lab Results  Component Value Date   INSULIN 9.1 09/16/2020   INSULIN 10.0 08/14/2019   INSULIN 12.4 03/25/2019   INSULIN 19.2 09/24/2018   INSULIN 13.7 05/03/2018   Lab Results  Component Value Date   TSH 1.500 03/25/2019   Lab Results  Component Value Date   CHOL 167 09/16/2020   HDL 42 09/16/2020   LDLCALC 105 (H) 09/16/2020   LDLDIRECT 148 (H) 11/15/2007   TRIG 111 09/16/2020   CHOLHDL 5.2 (H) 01/26/2016   Lab Results  Component Value Date   WBC 7.3 11/30/2017   HGB 15.0 11/30/2017   HCT 46.8 11/30/2017   MCV 86 11/30/2017   PLT 293 11/30/2017   No results found for: IRON, TIBC, FERRITIN  Obesity Behavioral Intervention:   Approximately 15 minutes were spent on the discussion below.  ASK: We discussed the diagnosis of obesity with [name] today and Deny agreed to give [name] permission to discuss obesity behavioral modification therapy today.  ASSESS: Baylen has the diagnosis of obesity and his BMI today is 40.91. Mare is in the action stage of change.   ADVISE: Tremain was educated on the multiple health risks of obesity as well as the benefit of weight loss to improve his health. He was advised of the need for long term treatment and the importance of lifestyle modifications to improve his current health and to decrease his risk of future health problems.  AGREE: Multiple dietary modification options and treatment options were discussed and Vivian agreed to follow the recommendations documented in the above note.  ARRANGE: Benn was educated on the importance of frequent visits to  treat obesity as outlined per CMS and USPSTF guidelines and agreed to schedule his next follow up appointment today.  Attestation Statements:   Reviewed by clinician on day of visit: allergies, medications, problem list, medical history, surgical history, family history, social history, and previous encounter notes.   I, Trixie Dredge, am acting as transcriptionist for [name], MD.  I have reviewed the above documentation for accuracy and completeness, and I agree with the above. -  [name], MD

## 2020-11-23 ENCOUNTER — Other Ambulatory Visit: Payer: Self-pay

## 2020-11-23 ENCOUNTER — Ambulatory Visit (INDEPENDENT_AMBULATORY_CARE_PROVIDER_SITE_OTHER): Payer: Medicare HMO | Admitting: Family Medicine

## 2020-11-23 ENCOUNTER — Encounter (INDEPENDENT_AMBULATORY_CARE_PROVIDER_SITE_OTHER): Payer: Self-pay | Admitting: Family Medicine

## 2020-11-23 VITALS — BP 136/73 | HR 70 | Temp 97.1°F | Ht 73.0 in | Wt 310.0 lb

## 2020-11-23 DIAGNOSIS — Z6841 Body Mass Index (BMI) 40.0 and over, adult: Secondary | ICD-10-CM | POA: Diagnosis not present

## 2020-11-23 DIAGNOSIS — E66813 Obesity, class 3: Secondary | ICD-10-CM

## 2020-11-23 DIAGNOSIS — F3289 Other specified depressive episodes: Secondary | ICD-10-CM | POA: Diagnosis not present

## 2020-11-23 DIAGNOSIS — E1169 Type 2 diabetes mellitus with other specified complication: Secondary | ICD-10-CM | POA: Diagnosis not present

## 2020-11-23 MED ORDER — BUPROPION HCL ER (SR) 200 MG PO TB12: 200.0000 mg | ORAL_TABLET | Freq: Every morning | ORAL | 0 refills | Status: DC

## 2020-11-25 NOTE — Progress Notes (Signed)
Chief Complaint:   OBESITY [name] [name] is here to discuss his progress with his obesity treatment plan along with follow-up of his obesity related diagnoses. [name] [name] is on keeping a food journal and adhering to recommended goals of 1500-1800 calories and 100+ grams of protein daily and states he is following his eating plan approximately 100% of the time. [name] [name] states he is walking for 25 minutes 5 times per week.  Today's visit was #: 80 Starting weight: 340 lbs Starting date: 12/28/2016 Today's weight: 310 lbs Today's date: 11/23/2020 Total lbs lost to date: 30 Total lbs lost since last in-office visit: 0  Interim History: [name] [name] has done well with maintaining his weight loss since his last visit. He is journaling when he can and being mindful when he cannot. He has done more traveling which makes weight loss more difficult.  Subjective:   1. Type 2 diabetes mellitus with other specified complication, without long-term current use of insulin (HCC) [name] [name] recently got his Ozempic filled, but he hasn't started it yet. He has questions about the injection and medications storage, etc.  2. Other depression with emotional eating [name] [name] is stable on Wellbutrin. He has continued to work on decreasing emotional eating behaviors, and he is tolerating his medications well. His blood pressure is stable and he denies insomnia.  Assessment/Plan:   1. Type 2 diabetes mellitus with other specified complication, without long-term current use of insulin (HCC) [name] [name] was informed about how to take Ozempic and storage, and he agreed to start it this week. He will continue with diet, exercise, and weight loss. Good blood sugar control is important to decrease the likelihood of diabetic complications such as [name] [name], neuropathy, limb loss, blindness, coronary artery disease, and death. Intensive lifestyle modification including diet, exercise and weight loss are the first line of treatment for diabetes.    2. Other depression with emotional eating Behavior modification techniques were discussed today to help [name] [name] deal with his emotional/non-hunger eating behaviors. We will refill Wellbutrin SR for 90 days with no refills. Orders and follow up as documented in patient record.   - buPROPion (WELLBUTRIN SR) 200 MG 12 hr tablet; Take 1 tablet (200 mg total) by mouth every morning.  Dispense: 90 tablet; Refill: 0  3. Class 3 severe obesity with serious comorbidity and body mass index (BMI) of 45.0 to 49.9 in adult, unspecified obesity type [name] [name]) [name] [name] is currently in the action stage of change. As such, his goal is to continue with weight loss efforts. He has agreed to keeping a food journal and adhering to recommended goals of 1500-1800 calories and 100+ grams of protein daily.   Exercise goals: As is.  Behavioral modification strategies: travel eating strategies.  [name] [name] has agreed to follow-up with our clinic in 2 weeks. He was informed of the importance of frequent follow-up visits to maximize his success with intensive lifestyle modifications for his multiple health conditions.   Objective:   Blood pressure 136/73, pulse 70, temperature (!) 97.1 F (36.2 C), height 6\' 1"  (1.854 m), weight (!) 310 lb (140.6 kg), SpO2 96 %. Body mass index is 40.9 kg/m.  General: Cooperative, alert, well developed, in no acute distress. HEENT: Conjunctivae and lids unremarkable. Cardiovascular: Regular rhythm.  Lungs: Normal work of breathing. Neurologic: No focal deficits.   Lab Results  Component Value Date   CREATININE 1.28 (H) 09/16/2020   BUN 19 09/16/2020   NA 136 09/16/2020   K 4.0 09/16/2020   CL 100 09/16/2020  CO2 21 09/16/2020   Lab Results  Component Value Date   ALT 23 09/16/2020   AST 17 09/16/2020   ALKPHOS 53 09/16/2020   BILITOT 0.6 09/16/2020   Lab Results  Component Value Date   HGBA1C 6.6 (H) 09/16/2020   HGBA1C 6.1 11/28/2019   HGBA1C 6.0 (H) 08/14/2019    HGBA1C 6.0 (H) 03/25/2019   HGBA1C 6.2 12/17/2018   Lab Results  Component Value Date   INSULIN 9.1 09/16/2020   INSULIN 10.0 08/14/2019   INSULIN 12.4 03/25/2019   INSULIN 19.2 09/24/2018   INSULIN 13.7 05/03/2018   Lab Results  Component Value Date   TSH 1.500 03/25/2019   Lab Results  Component Value Date   CHOL 167 09/16/2020   HDL 42 09/16/2020   LDLCALC 105 (H) 09/16/2020   LDLDIRECT 148 (H) 11/15/2007   TRIG 111 09/16/2020   CHOLHDL 5.2 (H) 01/26/2016   Lab Results  Component Value Date   WBC 7.3 11/30/2017   HGB 15.0 11/30/2017   HCT 46.8 11/30/2017   MCV 86 11/30/2017   PLT 293 11/30/2017   No results found for: IRON, TIBC, FERRITIN  Obesity Behavioral Intervention:   Approximately 15 minutes were spent on the discussion below.  ASK: We discussed the diagnosis of obesity with [name] [name] today and [name] [name] agreed to give [name] permission to discuss obesity behavioral modification therapy today.  ASSESS: [name] [name] has the diagnosis of obesity and his BMI today is 40.91. [name] [name] is in the action stage of change.   ADVISE: [name] [name] was educated on the multiple health risks of obesity as well as the benefit of weight loss to improve his health. He was advised of the need for long term treatment and the importance of lifestyle modifications to improve his current health and to decrease his risk of future health problems.  AGREE: Multiple dietary modification options and treatment options were discussed and [name] [name] agreed to follow the recommendations documented in the above note.  ARRANGE: [name] [name] was educated on the importance of frequent visits to treat obesity as outlined per CMS and USPSTF guidelines and agreed to schedule his next follow up appointment today.  Attestation Statements:   Reviewed by clinician on day of visit: allergies, medications, problem list, medical history, surgical history, family history, social history, and previous encounter notes.   I, Trixie Dredge, am acting as transcriptionist for [name], MD.  I have reviewed the above documentation for accuracy and completeness, and I agree with the above. -  [name], MD

## 2020-12-07 ENCOUNTER — Other Ambulatory Visit: Payer: Self-pay

## 2020-12-07 ENCOUNTER — Telehealth (INDEPENDENT_AMBULATORY_CARE_PROVIDER_SITE_OTHER): Payer: Medicare HMO | Admitting: Family Medicine

## 2020-12-07 ENCOUNTER — Encounter (INDEPENDENT_AMBULATORY_CARE_PROVIDER_SITE_OTHER): Payer: Self-pay | Admitting: Family Medicine

## 2020-12-07 DIAGNOSIS — J069 Acute upper respiratory infection, unspecified: Secondary | ICD-10-CM | POA: Diagnosis not present

## 2020-12-07 DIAGNOSIS — Z6841 Body Mass Index (BMI) 40.0 and over, adult: Secondary | ICD-10-CM | POA: Diagnosis not present

## 2020-12-08 NOTE — Progress Notes (Signed)
TeleHealth Visit:  Due to the COVID-19 pandemic, this visit was completed with telemedicine (audio/video) technology to reduce patient and provider exposure as well as to preserve personal protective equipment.   Keiland has verbally consented to this TeleHealth visit. The patient is located at home, the provider is located at the [facility] office. The participants in this visit include the listed provider and patient. The visit was conducted today via MyChart video.   Chief Complaint: OBESITY Kylar is here to discuss his progress with his obesity treatment plan along with follow-up of his obesity related diagnoses. Mattie is on keeping a food journal and adhering to recommended goals of 1500-1800 calories and 100+ grams of protein daily and states he is following his eating plan approximately 100% of the time. Tryton states he is walking for 15-20 minutes 6 times per week.  Today's visit was #: 76 Starting weight: 340 lbs Starting date: 12/28/2016  Interim History: Rithwik recent became ill with a URI and he hasn't been able to concentrate on weight loss. He is able to eat and drink.  Subjective:   1. Upper respiratory tract infection, unspecified type Demetrice notes sinus congestion and rhinorrhea, which started approximately 3 days ago. He had performed 3 funerals whichever heavily attended. He states this feels like a sinus infection but he is very tried. He has some sneezing. He has no fever, and he can still eat and drink.  Assessment/Plan:   1. Upper respiratory tract infection, unspecified type Ramel was advised to do a home COVID test, which he has to verify this isn't a COVID infection with the COVID rates increased locally. He is to contact his primary care physician if positive to discuss COVID treatment options.  2. Obesity with Current BMI 40.90 Kirby is currently in the action stage of change. As such, his goal is to continue with weight loss efforts. He  has agreed to keeping a food journal and adhering to recommended goals of 1500-1800 calories and 100+ grams of protein daily.   Alexys is ok to do protein drinks while recovering to help minimized muscle loss while recovering. Will follow up in 2 weeks.  Exercise goals: As is.  Behavioral modification strategies: increasing lean protein intake.  Shloima has agreed to follow-up with our clinic in 2 weeks. He was informed of the importance of frequent follow-up visits to maximize his success with intensive lifestyle modifications for his multiple health conditions.  Objective:   VITALS: Per patient if applicable, see vitals. GENERAL: Alert and in no acute distress. CARDIOPULMONARY: No increased WOB. Speaking in clear sentences.  PSYCH: Pleasant and cooperative. Speech normal rate and rhythm. Affect is appropriate. Insight and judgement are appropriate. Attention is focused, linear, and appropriate.  NEURO: Oriented as arrived to appointment on time with no prompting.   Lab Results  Component Value Date   CREATININE 1.28 (H) 09/16/2020   BUN 19 09/16/2020   NA 136 09/16/2020   K 4.0 09/16/2020   CL 100 09/16/2020   CO2 21 09/16/2020   Lab Results  Component Value Date   ALT 23 09/16/2020   AST 17 09/16/2020   ALKPHOS 53 09/16/2020   BILITOT 0.6 09/16/2020   Lab Results  Component Value Date   HGBA1C 6.6 (H) 09/16/2020   HGBA1C 6.1 11/28/2019   HGBA1C 6.0 (H) 08/14/2019   HGBA1C 6.0 (H) 03/25/2019   HGBA1C 6.2 12/17/2018   Lab Results  Component Value Date   INSULIN 9.1 09/16/2020  INSULIN 10.0 08/14/2019   INSULIN 12.4 03/25/2019   INSULIN 19.2 09/24/2018   INSULIN 13.7 05/03/2018   Lab Results  Component Value Date   TSH 1.500 03/25/2019   Lab Results  Component Value Date   CHOL 167 09/16/2020   HDL 42 09/16/2020   LDLCALC 105 (H) 09/16/2020   LDLDIRECT 148 (H) 11/15/2007   TRIG 111 09/16/2020   CHOLHDL 5.2 (H) 01/26/2016   Lab Results  Component Value  Date   WBC 7.3 11/30/2017   HGB 15.0 11/30/2017   HCT 46.8 11/30/2017   MCV 86 11/30/2017   PLT 293 11/30/2017   No results found for: IRON, TIBC, FERRITIN  Attestation Statements:   Reviewed by clinician on day of visit: allergies, medications, problem list, medical history, surgical history, family history, social history, and previous encounter notes.  Time spent on visit including pre-visit chart review and post-visit charting and care was 22 minutes.    I, Trixie Dredge, am acting as transcriptionist for [name], MD.  I have reviewed the above documentation for accuracy and completeness, and I agree with the above. - [name], MD

## 2020-12-21 ENCOUNTER — Telehealth (INDEPENDENT_AMBULATORY_CARE_PROVIDER_SITE_OTHER): Payer: Self-pay

## 2020-12-24 ENCOUNTER — Encounter (INDEPENDENT_AMBULATORY_CARE_PROVIDER_SITE_OTHER): Payer: Self-pay | Admitting: Bariatrics

## 2020-12-24 ENCOUNTER — Other Ambulatory Visit: Payer: Self-pay

## 2020-12-24 ENCOUNTER — Ambulatory Visit (INDEPENDENT_AMBULATORY_CARE_PROVIDER_SITE_OTHER): Payer: Medicare HMO | Admitting: Bariatrics

## 2020-12-24 VITALS — BP 112/74 | HR 70 | Temp 98.2°F | Ht 73.0 in | Wt 308.0 lb

## 2020-12-24 DIAGNOSIS — E559 Vitamin D deficiency, unspecified: Secondary | ICD-10-CM | POA: Diagnosis not present

## 2020-12-24 DIAGNOSIS — Z6841 Body Mass Index (BMI) 40.0 and over, adult: Secondary | ICD-10-CM

## 2020-12-24 DIAGNOSIS — I10 Essential (primary) hypertension: Secondary | ICD-10-CM

## 2020-12-24 DIAGNOSIS — F3289 Other specified depressive episodes: Secondary | ICD-10-CM

## 2020-12-24 DIAGNOSIS — N3941 Urge incontinence: Secondary | ICD-10-CM | POA: Diagnosis not present

## 2020-12-24 DIAGNOSIS — E1169 Type 2 diabetes mellitus with other specified complication: Secondary | ICD-10-CM

## 2020-12-24 MED ORDER — METFORMIN HCL 1000 MG PO TABS: 1000.0000 mg | ORAL_TABLET | Freq: Every day | ORAL | 0 refills | Status: DC

## 2020-12-24 MED ORDER — LOSARTAN POTASSIUM 50 MG PO TABS: ORAL_TABLET | ORAL | 0 refills | Status: DC

## 2020-12-24 MED ORDER — BUPROPION HCL ER (SR) 200 MG PO TB12: 200.0000 mg | ORAL_TABLET | Freq: Every morning | ORAL | 0 refills | Status: DC

## 2020-12-24 MED ORDER — FUROSEMIDE 40 MG PO TABS: 40.0000 mg | ORAL_TABLET | Freq: Every day | ORAL | 0 refills | Status: DC

## 2020-12-24 MED ORDER — MIRABEGRON ER 50 MG PO TB24: 50.0000 mg | ORAL_TABLET | Freq: Every day | ORAL | 0 refills | Status: DC

## 2020-12-24 MED ORDER — VITAMIN D (ERGOCALCIFEROL) 1.25 MG (50000 UNIT) PO CAPS: 50000.0000 [IU] | ORAL_CAPSULE | ORAL | 0 refills | Status: DC

## 2020-12-24 MED ORDER — SOLIFENACIN SUCCINATE 5 MG PO TABS: 5.0000 mg | ORAL_TABLET | Freq: Every day | ORAL | 0 refills | Status: DC

## 2020-12-24 MED ORDER — OZEMPIC (0.25 OR 0.5 MG/DOSE) 2 MG/1.5ML ~~LOC~~ SOPN: 0.2500 mg | PEN_INJECTOR | SUBCUTANEOUS | 0 refills | Status: DC

## 2020-12-29 DIAGNOSIS — E119 Type 2 diabetes mellitus without complications: Secondary | ICD-10-CM | POA: Diagnosis not present

## 2020-12-29 DIAGNOSIS — H0102A Squamous blepharitis right eye, upper and lower eyelids: Secondary | ICD-10-CM | POA: Diagnosis not present

## 2020-12-29 DIAGNOSIS — H02881 Meibomian gland dysfunction right upper eyelid: Secondary | ICD-10-CM | POA: Diagnosis not present

## 2020-12-29 DIAGNOSIS — H43811 Vitreous degeneration, right eye: Secondary | ICD-10-CM | POA: Diagnosis not present

## 2020-12-29 DIAGNOSIS — Z961 Presence of intraocular lens: Secondary | ICD-10-CM | POA: Diagnosis not present

## 2020-12-29 DIAGNOSIS — H0102B Squamous blepharitis left eye, upper and lower eyelids: Secondary | ICD-10-CM | POA: Diagnosis not present

## 2020-12-30 NOTE — Progress Notes (Signed)
Chief Complaint:   OBESITY [name] [name] is here to discuss his progress with his obesity treatment plan along with follow-up of his obesity related diagnoses. [name] [name] is on keeping a food journal and adhering to recommended goals of 1500-1800 calories and 100+ grams of protein daily and states he is following his eating plan approximately 85-90% of the time. [name] [name] states he is walking for 15-20 minutes 6 times per week.  Today's visit was #: 42 Starting weight: 340 lbs Starting date: 12/28/2016 Today's weight: 308 lbs Today's date: 12/24/2020 Total lbs lost to date: 32 Total lbs lost since last in-office visit: 2  Interim History: [name] [name] is down an another 2 lbs since his last visit. He is doing well with his water intake.  Subjective:   1. Vitamin D deficiency [name] [name] is taking prescription Vit D.  2. Type 2 diabetes mellitus with other specified complication, without long-term current use of insulin ([place]) [name] [name] is taking Ozempic and metformin.  3. Essential hypertension [name] [name]'s blood pressure is controlled.  4. Urge incontinence of urine [name] [name] is on medications and he is doing well.  5. Other depression with emotional eating [name] [name] is taking Wellbutrin.  Assessment/Plan:   1. Vitamin D deficiency Low Vitamin D level contributes to fatigue and are associated with obesity, breast, and colon cancer. We will refill prescription Vitamin D for 1 month. Aceton will follow-up for routine testing of Vitamin D, at least 2-3 times per year to avoid over-replacement.  - Vitamin D, Ergocalciferol, (DRISDOL) 1.25 MG (50000 UNIT) CAPS capsule; Take 1 capsule (50,000 Units total) by mouth every 14 (fourteen) days.  Dispense: 2 capsule; Refill: 0  2. Type 2 diabetes mellitus with other specified complication, without long-term current use of insulin (HCC) Kodie will continue his medications, and we will refill both metformin for 90 days with no refills and refill Ozempic for 1 month. Good  blood sugar control is important to decrease the likelihood of diabetic complications such as nephropathy, neuropathy, limb loss, blindness, coronary artery disease, and death. Intensive lifestyle modification including diet, exercise and weight loss are the first line of treatment for diabetes.   - metFORMIN (GLUCOPHAGE) 1000 MG tablet; Take 1 tablet (1,000 mg total) by mouth daily with breakfast.  Dispense: 90 tablet; Refill: 0 - Semaglutide,0.25 or 0.5MG /DOS, (OZEMPIC, 0.25 OR 0.5 MG/DOSE,) 2 MG/1.5ML SOPN; Inject 0.25 mg into the skin every 7 (seven) days.  Dispense: 1.5 mL; Refill: 0  3. Essential hypertension Lerone is working on healthy weight loss and exercise to improve blood pressure control. We will watch for signs of hypotension as he continues his lifestyle modifications. We will refill both Lasix and losartan for 90 days with no refills.  - furosemide (LASIX) 40 MG tablet; Take 1 tablet (40 mg total) by mouth daily.  Dispense: 90 tablet; Refill: 0 - losartan (COZAAR) 50 MG tablet; TAKE 1 TABLET BY MOUTH ONCE A DAY. TAKE WITH HCTZ.  Dispense: 90 tablet; Refill: 0  4. Urge incontinence of urine Laramie will continue his medications, and we will refill both Myrbetriq and Vesicare for 90 days with no refills.  - mirabegron ER (MYRBETRIQ) 50 MG TB24 tablet; Take 1 tablet (50 mg total) by mouth daily.  Dispense: 90 tablet; Refill: 0 - solifenacin (VESICARE) 5 MG tablet; Take 1 tablet (5 mg total) by mouth daily.  Dispense: 90 tablet; Refill: 0  5. Other depression with emotional eating Behavior modification techniques were discussed today to help [name] [name] deal with his emotional/non-hunger eating behaviors.  We will refill Wellbutrin SR for 90 days with no refills. Orders and follow up as documented in patient record.   - buPROPion (WELLBUTRIN SR) 200 MG 12 hr tablet; Take 1 tablet (200 mg total) by mouth every morning.  Dispense: 90 tablet; Refill: 0  6. Obesity with Current BMI 46 [name] [name]  is currently in the action stage of change. As such, his goal is to continue with weight loss efforts. He has agreed to keeping a food journal and adhering to recommended goals of 1500-1800 calories and 100+ grams of protein daily.   Intentional eating was discussed. Aaronmichael will keep his water and protein high.  Exercise goals: As is.  Behavioral modification strategies: increasing lean protein intake, decreasing simple carbohydrates, increasing vegetables, increasing water intake, decreasing eating out, no skipping meals, meal planning and cooking strategies, keeping healthy foods in the home and planning for success.  Rayaan has agreed to follow-up with our clinic in 3 to 4 weeks with Dr. Leafy Ro. He was informed of the importance of frequent follow-up visits to maximize his success with intensive lifestyle modifications for his multiple health conditions.   Objective:   Blood pressure 112/74, pulse 70, temperature 98.2 F (36.8 C), height 6\' 1"  (1.854 m), weight (!) 308 lb (139.7 kg), SpO2 97 %. Body mass index is 40.64 kg/m.  General: Cooperative, alert, well developed, in no acute distress. HEENT: Conjunctivae and lids unremarkable. Cardiovascular: Regular rhythm.  Lungs: Normal work of breathing. Neurologic: No focal deficits.   Lab Results  Component Value Date   CREATININE 1.28 (H) 09/16/2020   BUN 19 09/16/2020   NA 136 09/16/2020   K 4.0 09/16/2020   CL 100 09/16/2020   CO2 21 09/16/2020   Lab Results  Component Value Date   ALT 23 09/16/2020   AST 17 09/16/2020   ALKPHOS 53 09/16/2020   BILITOT 0.6 09/16/2020   Lab Results  Component Value Date   HGBA1C 6.6 (H) 09/16/2020   HGBA1C 6.1 11/28/2019   HGBA1C 6.0 (H) 08/14/2019   HGBA1C 6.0 (H) 03/25/2019   HGBA1C 6.2 12/17/2018   Lab Results  Component Value Date   INSULIN 9.1 09/16/2020   INSULIN 10.0 08/14/2019   INSULIN 12.4 03/25/2019   INSULIN 19.2 09/24/2018   INSULIN 13.7 05/03/2018   Lab Results   Component Value Date   TSH 1.500 03/25/2019   Lab Results  Component Value Date   CHOL 167 09/16/2020   HDL 42 09/16/2020   LDLCALC 105 (H) 09/16/2020   LDLDIRECT 148 (H) 11/15/2007   TRIG 111 09/16/2020   CHOLHDL 5.2 (H) 01/26/2016   Lab Results  Component Value Date   WBC 7.3 11/30/2017   HGB 15.0 11/30/2017   HCT 46.8 11/30/2017   MCV 86 11/30/2017   PLT 293 11/30/2017   No results found for: IRON, TIBC, FERRITIN  Obesity Behavioral Intervention:   Approximately 15 minutes were spent on the discussion below.  ASK: We discussed the diagnosis of obesity with [name] today and Bliss agreed to give [name] permission to discuss obesity behavioral modification therapy today.  ASSESS: Matthias has the diagnosis of obesity and his BMI today is 40.64. Gaven is in the action stage of change.   ADVISE: Jamail was educated on the multiple health risks of obesity as well as the benefit of weight loss to improve his health. He was advised of the need for long term treatment and the importance of lifestyle modifications to improve his current health and to decrease  his risk of future health problems.  AGREE: Multiple dietary modification options and treatment options were discussed and Cypher agreed to follow the recommendations documented in the above note.  ARRANGE: Jonnie was educated on the importance of frequent visits to treat obesity as outlined per CMS and USPSTF guidelines and agreed to schedule his next follow up appointment today.  Attestation Statements:   Reviewed by clinician on day of visit: allergies, medications, problem list, medical history, surgical history, family history, social history, and previous encounter notes.   Wilhemena Durie, am acting as Location manager for [employer], DO.  I have reviewed the above documentation for accuracy and completeness, and I agree with the above. Jearld Lesch, DO

## 2020-12-31 ENCOUNTER — Encounter (INDEPENDENT_AMBULATORY_CARE_PROVIDER_SITE_OTHER): Payer: Self-pay | Admitting: Bariatrics

## 2021-01-18 ENCOUNTER — Other Ambulatory Visit: Payer: Self-pay

## 2021-01-18 ENCOUNTER — Ambulatory Visit (INDEPENDENT_AMBULATORY_CARE_PROVIDER_SITE_OTHER): Payer: Medicare HMO | Admitting: Family Medicine

## 2021-01-18 ENCOUNTER — Encounter (INDEPENDENT_AMBULATORY_CARE_PROVIDER_SITE_OTHER): Payer: Self-pay | Admitting: Family Medicine

## 2021-01-18 VITALS — BP 138/86 | HR 62 | Temp 97.5°F | Ht 73.0 in | Wt 309.0 lb

## 2021-01-18 DIAGNOSIS — E1169 Type 2 diabetes mellitus with other specified complication: Secondary | ICD-10-CM

## 2021-01-18 DIAGNOSIS — E559 Vitamin D deficiency, unspecified: Secondary | ICD-10-CM | POA: Diagnosis not present

## 2021-01-18 DIAGNOSIS — F3289 Other specified depressive episodes: Secondary | ICD-10-CM | POA: Diagnosis not present

## 2021-01-18 DIAGNOSIS — E7849 Other hyperlipidemia: Secondary | ICD-10-CM | POA: Diagnosis not present

## 2021-01-18 DIAGNOSIS — Z6841 Body Mass Index (BMI) 40.0 and over, adult: Secondary | ICD-10-CM | POA: Diagnosis not present

## 2021-01-18 MED ORDER — VITAMIN D (ERGOCALCIFEROL) 1.25 MG (50000 UNIT) PO CAPS: 50000.0000 [IU] | ORAL_CAPSULE | ORAL | 0 refills | Status: DC

## 2021-01-18 MED ORDER — ESCITALOPRAM OXALATE 20 MG PO TABS: 20.0000 mg | ORAL_TABLET | Freq: Every day | ORAL | 0 refills | Status: DC

## 2021-01-18 MED ORDER — ROSUVASTATIN CALCIUM 40 MG PO TABS: ORAL_TABLET | ORAL | 0 refills | Status: DC

## 2021-01-25 NOTE — Progress Notes (Signed)
Chief Complaint:   OBESITY [name] [name] is here to discuss his progress with his obesity treatment plan along with follow-up of his obesity related diagnoses. [name] [name] is on keeping a food journal and adhering to recommended goals of 1500-1800 calories and 100+ grams of protein daily and states he is following his eating plan approximately 90% of the time. [name] [name] states he is walking for 15 minutes 5 times per week.  Today's visit was #: 66 Starting weight: 340 lbs Starting date: 12/28/2016 Today's weight: 309 lbs Today's date: 01/18/2021 Total lbs lost to date: 31 Total lbs lost since last in-office visit: 0  Interim History: [name] [name] has done well maintaining his weight. He notes a lot of chaos in his life and meal planning has been more challenging. He is mindful of his choices and trying to meet his protein goals.  Subjective:   1. Other hyperlipidemia [name] [name] is stable on his medications and he is working on diet and exercise. He denies chest pain or myalgias.  2. Vitamin D deficiency [name] [name] is stable on Vit D, and he denies nausea, vomiting, or muscle weakness.  3. Type 2 diabetes mellitus with other specified complication, without long-term current use of insulin ([place]) [name] [name] states his fasting BGs mostly range in the 130's, and he notes decreased polyphagia on Ozempic. He sometimes struggles to eat all of the food on his plan.  4. Other depression with emotional eating [name] [name] is still dealing with increased work and family stress, but he is doing well with controlling his emotional eating behaviors.  Assessment/Plan:   1. Other hyperlipidemia Cardiovascular risk and specific lipid/LDL goals reviewed. We discussed several lifestyle modifications today. [name] [name] will continue to work on diet, exercise and weight loss efforts. We will refill Crestor for 90 days with no refills. Orders and follow up as documented in patient record.   - rosuvastatin (CRESTOR) 40 MG tablet; Take 1 tablet  by mouth daily  Dispense: 90 tablet; Refill: 0  2. Vitamin D deficiency Low Vitamin D level contributes to fatigue and are associated with obesity, breast, and colon cancer. We will refill prescription Vitamin D for 1 month. [name] [name] will follow-up for routine testing of Vitamin D, at least 2-3 times per year to avoid over-replacement.  - Vitamin D, Ergocalciferol, (DRISDOL) 1.25 MG (50000 UNIT) CAPS capsule; Take 1 capsule (50,000 Units total) by mouth every 14 (fourteen) days.  Dispense: 2 capsule; Refill: 0  3. Type 2 diabetes mellitus with other specified complication, without long-term current use of insulin (HCC) [name] [name] will continue Ozempic at 0.25 mg q weekly, and he will continue to follow up as directed. Good blood sugar control is important to decrease the likelihood of diabetic complications such as nephropathy, neuropathy, limb loss, blindness, coronary artery disease, and death. Intensive lifestyle modification including diet, exercise and weight loss are the first line of treatment for diabetes.   4. Other depression with emotional eating Behavior modification techniques were discussed today to help [name] [name] deal with his emotional/non-hunger eating behaviors. We will refill Lexapro for 90 days with no refills. Orders and follow up as documented in patient record.   - escitalopram (LEXAPRO) 20 MG tablet; Take 1 tablet (20 mg total) by mouth daily.  Dispense: 90 tablet; Refill: 0  5. Obesity with current BMI 40.8 [name] [name] is currently in the action stage of change. As such, his goal is to continue with weight loss efforts. He has agreed to the Category 3 Plan or keeping a food journal and  adhering to recommended goals of 1500-1800 calories and 100 grams of protein daily.   Exercise goals: As is.  Behavioral modification strategies: no skipping meals and meal planning and cooking strategies.  [name] [name] has agreed to follow-up with our clinic in 2 to 3 weeks. He was informed of the  importance of frequent follow-up visits to maximize his success with intensive lifestyle modifications for his multiple health conditions.   Objective:   Blood pressure 138/86, pulse 62, temperature (!) 97.5 F (36.4 C), height 6\' 1"  (1.854 m), weight (!) 309 lb (140.2 kg), SpO2 96 %. Body mass index is 40.77 kg/m.  General: Cooperative, alert, well developed, in no acute distress. HEENT: Conjunctivae and lids unremarkable. Cardiovascular: Regular rhythm.  Lungs: Normal work of breathing. Neurologic: No focal deficits.   Lab Results  Component Value Date   CREATININE 1.28 (H) 09/16/2020   BUN 19 09/16/2020   NA 136 09/16/2020   K 4.0 09/16/2020   CL 100 09/16/2020   CO2 21 09/16/2020   Lab Results  Component Value Date   ALT 23 09/16/2020   AST 17 09/16/2020   ALKPHOS 53 09/16/2020   BILITOT 0.6 09/16/2020   Lab Results  Component Value Date   HGBA1C 6.6 (H) 09/16/2020   HGBA1C 6.1 11/28/2019   HGBA1C 6.0 (H) 08/14/2019   HGBA1C 6.0 (H) 03/25/2019   HGBA1C 6.2 12/17/2018   Lab Results  Component Value Date   INSULIN 9.1 09/16/2020   INSULIN 10.0 08/14/2019   INSULIN 12.4 03/25/2019   INSULIN 19.2 09/24/2018   INSULIN 13.7 05/03/2018   Lab Results  Component Value Date   TSH 1.500 03/25/2019   Lab Results  Component Value Date   CHOL 167 09/16/2020   HDL 42 09/16/2020   LDLCALC 105 (H) 09/16/2020   LDLDIRECT 148 (H) 11/15/2007   TRIG 111 09/16/2020   CHOLHDL 5.2 (H) 01/26/2016   Lab Results  Component Value Date   WBC 7.3 11/30/2017   HGB 15.0 11/30/2017   HCT 46.8 11/30/2017   MCV 86 11/30/2017   PLT 293 11/30/2017   No results found for: IRON, TIBC, FERRITIN  Obesity Behavioral Intervention:   Approximately 15 minutes were spent on the discussion below.  ASK: We discussed the diagnosis of obesity with [name] [name] today and [name] [name] agreed to give [name] permission to discuss obesity behavioral modification therapy today.  ASSESS: [name] [name] has the  diagnosis of obesity and his BMI today is 40.78. [name] [name] is in the action stage of change.   ADVISE: [name] [name] was educated on the multiple health risks of obesity as well as the benefit of weight loss to improve his health. He was advised of the need for long term treatment and the importance of lifestyle modifications to improve his current health and to decrease his risk of future health problems.  AGREE: Multiple dietary modification options and treatment options were discussed and [name] [name] agreed to follow the recommendations documented in the above note.  ARRANGE: [place] was educated on the importance of frequent visits to treat obesity as outlined per CMS and USPSTF guidelines and agreed to schedule his next follow up appointment today.  Attestation Statements:   Reviewed by clinician on day of visit: allergies, medications, problem list, medical history, surgical history, family history, social history, and previous encounter notes.   I, Trixie Dredge, am acting as transcriptionist for [name], MD.  I have reviewed the above documentation for accuracy and completeness, and I agree with the above. -  [employer],  MD   

## 2021-02-08 ENCOUNTER — Encounter (INDEPENDENT_AMBULATORY_CARE_PROVIDER_SITE_OTHER): Payer: Self-pay | Admitting: Family Medicine

## 2021-02-08 ENCOUNTER — Ambulatory Visit (INDEPENDENT_AMBULATORY_CARE_PROVIDER_SITE_OTHER): Payer: Medicare HMO | Admitting: Family Medicine

## 2021-02-08 ENCOUNTER — Other Ambulatory Visit: Payer: Self-pay

## 2021-02-08 VITALS — BP 126/79 | HR 68 | Temp 97.7°F | Ht 73.0 in | Wt 306.0 lb

## 2021-02-08 DIAGNOSIS — Z6841 Body Mass Index (BMI) 40.0 and over, adult: Secondary | ICD-10-CM

## 2021-02-08 DIAGNOSIS — E1169 Type 2 diabetes mellitus with other specified complication: Secondary | ICD-10-CM

## 2021-02-08 DIAGNOSIS — E559 Vitamin D deficiency, unspecified: Secondary | ICD-10-CM | POA: Diagnosis not present

## 2021-02-08 MED ORDER — OZEMPIC (0.25 OR 0.5 MG/DOSE) 2 MG/1.5ML ~~LOC~~ SOPN: 0.2500 mg | PEN_INJECTOR | SUBCUTANEOUS | 0 refills | Status: DC

## 2021-02-08 MED ORDER — VITAMIN D (ERGOCALCIFEROL) 1.25 MG (50000 UNIT) PO CAPS: 50000.0000 [IU] | ORAL_CAPSULE | ORAL | 0 refills | Status: DC

## 2021-02-09 ENCOUNTER — Ambulatory Visit (INDEPENDENT_AMBULATORY_CARE_PROVIDER_SITE_OTHER): Payer: Medicare HMO | Admitting: Family Medicine

## 2021-02-15 NOTE — Progress Notes (Signed)
Chief Complaint:   OBESITY [name] [name] is here to discuss his progress with his obesity treatment plan along with follow-up of his obesity related diagnoses. [name] [name] is on the Category 3 Plan or keeping a food journal and adhering to recommended goals of 1500-1800 calories and 100 grams of protein daily and states he is following his eating plan approximately 90% of the time. [name] [name] states he is doing 0 minutes 0 times per week.  Today's visit was #: 68 Starting weight: 340 lbs Starting date: 12/28/2016 Today's weight: 306 lbs Today's date: 02/08/2021 Total lbs lost to date: 34 Total lbs lost since last in-office visit: 3  Interim History: [name] [name] continues to do well with weight loss. Hunger is mostly controlled and he remains mindful of his food choices and portions. He is still dealing with stress at his job but he appears to be dealing with this better overall.  Subjective:   1. Type 2 diabetes mellitus with other specified complication, without long-term current use of insulin (HCC) [name] [name] is stable on Ozempic, and he denies nausea, vomiting, or hypoglycemia. He still notes some polyphagia.  2. Vitamin D deficiency [name] [name] is on Vit D prescription, and he denies nausea, vomiting, or muscle weakness. He needs a refill today.  Assessment/Plan:   1. Type 2 diabetes mellitus with other specified complication, without long-term current use of insulin ([name] [name]) [name] [name] agreed to increase Ozempic to 0.5 mg weekly and we will refill for 1 month. Good blood sugar control is important to decrease the likelihood of diabetic complications such as nephropathy, neuropathy, limb loss, blindness, coronary artery disease, and death. Intensive lifestyle modification including diet, exercise and weight loss are the first line of treatment for diabetes.   - Semaglutide,0.25 or 0.5MG /DOS, (OZEMPIC, 0.25 OR 0.5 MG/DOSE,) 2 MG/1.5ML SOPN; Inject 0.25 mg into the skin every 7 (seven) days.  Dispense: 1.5 mL;  Refill: 0  2. Vitamin D deficiency Low Vitamin D level contributes to fatigue and are associated with obesity, breast, and colon cancer. We will refill prescription Vitamin D for 1 month. [name] [name] will follow-up for routine testing of Vitamin D, at least 2-3 times per year to avoid over-replacement.  - Vitamin D, Ergocalciferol, (DRISDOL) 1.25 MG (50000 UNIT) CAPS capsule; Take 1 capsule (50,000 Units total) by mouth every 14 (fourteen) days.  Dispense: 2 capsule; Refill: 0  3. Obesity with current BMI 40.5 [name] [name] is currently in the action stage of change. As such, his goal is to continue with weight loss efforts. He has agreed to the Category 3 Plan or keeping a food journal and adhering to recommended goals of 1500-1800 calories and 100+ grams of protein daily.   Exercise goals: No exercise has been prescribed at this time.  Behavioral modification strategies: increasing lean protein intake and meal planning and cooking strategies.  [name] [name] has agreed to follow-up with our clinic in 2 to 3 weeks. He was informed of the importance of frequent follow-up visits to maximize his success with intensive lifestyle modifications for his multiple health conditions.   Objective:   Blood pressure 126/79, pulse 68, temperature 97.7 F (36.5 C), height 6\' 1"  (1.854 m), weight (!) 306 lb (138.8 kg), SpO2 98 %. Body mass index is 40.37 kg/m.  General: Cooperative, alert, well developed, in no acute distress. HEENT: Conjunctivae and lids unremarkable. Cardiovascular: Regular rhythm.  Lungs: Normal work of breathing. Neurologic: No focal deficits.   Lab Results  Component Value Date   CREATININE 1.28 (H) 09/16/2020  BUN 19 09/16/2020   NA 136 09/16/2020   K 4.0 09/16/2020   CL 100 09/16/2020   CO2 21 09/16/2020   Lab Results  Component Value Date   ALT 23 09/16/2020   AST 17 09/16/2020   ALKPHOS 53 09/16/2020   BILITOT 0.6 09/16/2020   Lab Results  Component Value Date   HGBA1C 6.6 (H)  09/16/2020   HGBA1C 6.1 11/28/2019   HGBA1C 6.0 (H) 08/14/2019   HGBA1C 6.0 (H) 03/25/2019   HGBA1C 6.2 12/17/2018   Lab Results  Component Value Date   INSULIN 9.1 09/16/2020   INSULIN 10.0 08/14/2019   INSULIN 12.4 03/25/2019   INSULIN 19.2 09/24/2018   INSULIN 13.7 05/03/2018   Lab Results  Component Value Date   TSH 1.500 03/25/2019   Lab Results  Component Value Date   CHOL 167 09/16/2020   HDL 42 09/16/2020   LDLCALC 105 (H) 09/16/2020   LDLDIRECT 148 (H) 11/15/2007   TRIG 111 09/16/2020   CHOLHDL 5.2 (H) 01/26/2016   Lab Results  Component Value Date   VD25OH 52.1 09/16/2020   VD25OH 58.3 02/27/2020   VD25OH 57.1 08/14/2019   Lab Results  Component Value Date   WBC 7.3 11/30/2017   HGB 15.0 11/30/2017   HCT 46.8 11/30/2017   MCV 86 11/30/2017   PLT 293 11/30/2017   No results found for: IRON, TIBC, FERRITIN  Obesity Behavioral Intervention:   Approximately 15 minutes were spent on the discussion below.  ASK: We discussed the diagnosis of obesity with [name] [name] today and [name] [name] agreed to give [name] permission to discuss obesity behavioral modification therapy today.  ASSESS: [name] [name] has the diagnosis of obesity and his BMI today is 40.38. [name] [name] is in the action stage of change.   ADVISE: [name] [name] was educated on the multiple health risks of obesity as well as the benefit of weight loss to improve his health. He was advised of the need for long term treatment and the importance of lifestyle modifications to improve his current health and to decrease his risk of future health problems.  AGREE: Multiple dietary modification options and treatment options were discussed and [name] [name] agreed to follow the recommendations documented in the above note.  ARRANGE: [name] [name] was educated on the importance of frequent visits to treat obesity as outlined per CMS and USPSTF guidelines and agreed to schedule his next follow up appointment today.  Attestation Statements:    Reviewed by clinician on day of visit: allergies, medications, problem list, medical history, surgical history, family history, social history, and previous encounter notes.   I, Trixie Dredge, am acting as transcriptionist for [name], MD.  I have reviewed the above documentation for accuracy and completeness, and I agree with the above. -  [name], MD

## 2021-02-22 ENCOUNTER — Encounter (INDEPENDENT_AMBULATORY_CARE_PROVIDER_SITE_OTHER): Payer: Self-pay | Admitting: Family Medicine

## 2021-02-22 ENCOUNTER — Other Ambulatory Visit: Payer: Self-pay

## 2021-02-22 ENCOUNTER — Ambulatory Visit (INDEPENDENT_AMBULATORY_CARE_PROVIDER_SITE_OTHER): Payer: Medicare HMO | Admitting: Family Medicine

## 2021-02-22 VITALS — BP 140/75 | HR 67 | Temp 98.0°F | Ht 73.0 in | Wt 306.0 lb

## 2021-02-22 DIAGNOSIS — Z6841 Body Mass Index (BMI) 40.0 and over, adult: Secondary | ICD-10-CM | POA: Diagnosis not present

## 2021-02-22 DIAGNOSIS — E559 Vitamin D deficiency, unspecified: Secondary | ICD-10-CM

## 2021-02-22 DIAGNOSIS — I10 Essential (primary) hypertension: Secondary | ICD-10-CM | POA: Diagnosis not present

## 2021-02-22 DIAGNOSIS — F3289 Other specified depressive episodes: Secondary | ICD-10-CM

## 2021-02-22 DIAGNOSIS — E7849 Other hyperlipidemia: Secondary | ICD-10-CM

## 2021-02-22 DIAGNOSIS — E1169 Type 2 diabetes mellitus with other specified complication: Secondary | ICD-10-CM | POA: Diagnosis not present

## 2021-02-22 MED ORDER — METFORMIN HCL 1000 MG PO TABS: 1000.0000 mg | ORAL_TABLET | Freq: Every day | ORAL | 0 refills | Status: DC

## 2021-02-22 MED ORDER — LOSARTAN POTASSIUM 50 MG PO TABS: ORAL_TABLET | ORAL | 0 refills | Status: DC

## 2021-02-22 MED ORDER — BUPROPION HCL ER (SR) 200 MG PO TB12: 200.0000 mg | ORAL_TABLET | Freq: Every morning | ORAL | 0 refills | Status: DC

## 2021-02-22 MED ORDER — VITAMIN D (ERGOCALCIFEROL) 1.25 MG (50000 UNIT) PO CAPS: 50000.0000 [IU] | ORAL_CAPSULE | ORAL | 0 refills | Status: DC

## 2021-02-22 MED ORDER — ROSUVASTATIN CALCIUM 40 MG PO TABS: ORAL_TABLET | ORAL | 0 refills | Status: DC

## 2021-02-23 LAB — CMP14+EGFR
ALT: 31 IU/L (ref 0–44)
AST: 20 IU/L (ref 0–40)
Albumin/Globulin Ratio: 2.3 — ABNORMAL HIGH (ref 1.2–2.2)
Albumin: 4.5 g/dL (ref 3.7–4.7)
Alkaline Phosphatase: 63 IU/L (ref 44–121)
BUN/Creatinine Ratio: 19 (ref 10–24)
BUN: 23 mg/dL (ref 8–27)
Bilirubin Total: 0.5 mg/dL (ref 0.0–1.2)
CO2: 23 mmol/L (ref 20–29)
Calcium: 9.1 mg/dL (ref 8.6–10.2)
Chloride: 103 mmol/L (ref 96–106)
Creatinine, Ser: 1.2 mg/dL (ref 0.76–1.27)
Globulin, Total: 2 g/dL (ref 1.5–4.5)
Glucose: 122 mg/dL — ABNORMAL HIGH (ref 65–99)
Potassium: 4.6 mmol/L (ref 3.5–5.2)
Sodium: 139 mmol/L (ref 134–144)
Total Protein: 6.5 g/dL (ref 6.0–8.5)
eGFR: 63 mL/min/{1.73_m2} (ref >59)

## 2021-02-23 LAB — HEMOGLOBIN A1C
Est. average glucose Bld gHb Est-mCnc: 134 mg/dL
Hgb A1c MFr Bld: 6.3 % — ABNORMAL HIGH (ref 4.8–5.6)

## 2021-02-23 LAB — LIPID PANEL WITH LDL/HDL RATIO
Cholesterol, Total: 143 mg/dL (ref 100–199)
HDL: 39 mg/dL — ABNORMAL LOW (ref >39)
LDL Chol Calc (NIH): 85 mg/dL (ref 0–99)
LDL/HDL Ratio: 2.2 ratio (ref 0.0–3.6)
Triglycerides: 100 mg/dL (ref 0–149)
VLDL Cholesterol Cal: 19 mg/dL (ref 5–40)

## 2021-02-23 LAB — INSULIN, RANDOM: INSULIN: 11.7 u[IU]/mL (ref 2.6–24.9)

## 2021-02-23 LAB — VITAMIN D 25 HYDROXY (VIT D DEFICIENCY, FRACTURES): Vit D, 25-Hydroxy: 70.4 ng/mL (ref 30.0–100.0)

## 2021-02-26 NOTE — Progress Notes (Signed)
Chief Complaint:   OBESITY [name] [name] is here to discuss his progress with his obesity treatment plan along with follow-up of his obesity related diagnoses. [name] [name] is on the Category 3 Plan or keeping a food journal and adhering to recommended goals of 1500-1800 calories and 100+ grams of protein daily and states he is following his eating plan approximately 95% of the time. [name] [name] states he is walking for 15-20 minutes 7 times per week.  Today's visit was #: 2 Starting weight: 340 lbs Starting date: 12/28/2016 Today's weight: 306 lbs Today's date: 02/22/2021 Total lbs lost to date: 34 Total lbs lost since last in-office visit: 0  Interim History: [name] [name] continues to well with maintaining his weight. He is walking for exercise and he is working on keeping himself occupied to help decrease boredom eating. His hunger is controlled and he is working on meeting his protein goal which is more difficult in the Summer.  Subjective:   1. Vitamin D deficiency [name] [name] is stable on Vit D and he is due for labs.  2. Other hyperlipidemia [name] [name] is stable on Crestor, and he is working on diet and exercise. He denies chest pain or myalgias.  3. Type 2 diabetes mellitus with other specified complication, without long-term current use of insulin ([name] [name]) [name] [name] recently increased Ozempic to 0.5 mg q weekly and he is tolerating this well. He notes decreased polyphagia, and he is due for labs.  4. Essential hypertension [name] [name]'s blood pressure is mildly elevated today, but it normally better controlled. He denies chest pain or headache.  5. Other depression with emotional eating [name] [name] is stable on Wellbutrin, and he is working on decreasing emotional eating behaviors. He notes no side effects.  Assessment/Plan:   1. Vitamin D deficiency Low Vitamin D level contributes to fatigue and are associated with obesity, breast, and colon cancer. We will check labs today, and we will refill prescription  Vitamin D for 1 month. [name] [name] will follow-up for routine testing of Vitamin D, at least 2-3 times per year to avoid over-replacement.  - Vitamin D, Ergocalciferol, (DRISDOL) 1.25 MG (50000 UNIT) CAPS capsule; Take 1 capsule (50,000 Units total) by mouth every 14 (fourteen) days.  Dispense: 2 capsule; Refill: 0 - VITAMIN D 25 Hydroxy (Vit-D Deficiency, Fractures)  2. Other hyperlipidemia Cardiovascular risk and specific lipid/LDL goals reviewed. We discussed several lifestyle modifications today. [name] [name] will continue to work on diet, exercise and weight loss efforts. We will check labs today. We will refill Crestor for 90 days with no refills. Orders and follow up as documented in patient record.   - rosuvastatin (CRESTOR) 40 MG tablet; Take 1 tablet by mouth daily  Dispense: 90 tablet; Refill: 0 - Lipid Panel With LDL/HDL Ratio  3. Type 2 diabetes mellitus with other specified complication, without long-term current use of insulin ([name] [name]) We will check labs today, and we will refill metformin for 90 days with no refills. [name] [name] will continue Ozempic at 0.5 mg q weekly. Good blood sugar control is important to decrease the likelihood of diabetic complications such as nephropathy, neuropathy, limb loss, blindness, coronary artery disease, and death. Intensive lifestyle modification including diet, exercise and weight loss are the first line of treatment for diabetes.   - metFORMIN (GLUCOPHAGE) 1000 MG tablet; Take 1 tablet (1,000 mg total) by mouth daily with breakfast.  Dispense: 90 tablet; Refill: 0 - CMP14+EGFR - Insulin, random - Hemoglobin A1c  4. Essential hypertension [name] [name] will continue working on healthy weight loss and  exercise to improve blood pressure control. We will refill losartan for 90 days with no refills, and will watch for signs of hypotension as he continues his lifestyle modifications.  - losartan (COZAAR) 50 MG tablet; TAKE 1 TABLET BY MOUTH ONCE A DAY. TAKE WITH HCTZ.   Dispense: 90 tablet; Refill: 0  5. Other depression with emotional eating Behavior modification techniques were discussed today to help [name] [name] deal with his emotional/non-hunger eating behaviors. We will refill Wellbutrin SR for 90 days with no refills. Orders and follow up as documented in patient record.   - buPROPion (WELLBUTRIN SR) 200 MG 12 hr tablet; Take 1 tablet (200 mg total) by mouth every morning.  Dispense: 90 tablet; Refill: 0  6. Obesity with current BMI 40.5 [name] [name] is currently in the action stage of change. As such, his goal is to continue with weight loss efforts. He has agreed to keeping a food journal and adhering to recommended goals of 1500-1800 calories and 100+ grams of protein daily.   Exercise goals: As is.  Behavioral modification strategies: increasing lean protein intake.  [name] [name] has agreed to follow-up with our clinic in 2 to 3 weeks. He was informed of the importance of frequent follow-up visits to maximize his success with intensive lifestyle modifications for his multiple health conditions.   [name] [name] was informed we would discuss his lab results at his next visit unless there is a critical issue that needs to be addressed sooner. [name] [name] agreed to keep his next visit at the agreed upon time to discuss these results.  Objective:   Blood pressure 140/75, pulse 67, temperature 98 F (36.7 C), height _0  (1.854 m), weight (!) 306 lb (138.8 kg), SpO2 98 %. Body mass index is 40.37 kg/m.  General: Cooperative, alert, well developed, in no acute distress. HEENT: Conjunctivae and lids unremarkable. Cardiovascular: Regular rhythm.  Lungs: Normal work of breathing. Neurologic: No focal deficits.   Lab Results  Component Value Date   CREATININE 1.20 02/22/2021   BUN 23 02/22/2021   NA 139 02/22/2021   K 4.6 02/22/2021   CL 103 02/22/2021   CO2 23 02/22/2021   Lab Results  Component Value Date   ALT 31 02/22/2021   AST 20 02/22/2021   ALKPHOS 63  02/22/2021   BILITOT 0.5 02/22/2021   Lab Results  Component Value Date   HGBA1C 6.3 (H) 02/22/2021   HGBA1C 6.6 (H) 09/16/2020   HGBA1C 6.1 11/28/2019   HGBA1C 6.0 (H) 08/14/2019   HGBA1C 6.0 (H) 03/25/2019   Lab Results  Component Value Date   INSULIN 11.7 02/22/2021   INSULIN 9.1 09/16/2020   INSULIN 10.0 08/14/2019   INSULIN 12.4 03/25/2019   INSULIN 19.2 09/24/2018   Lab Results  Component Value Date   TSH 1.500 03/25/2019   Lab Results  Component Value Date   CHOL 143 02/22/2021   HDL 39 (L) 02/22/2021   LDLCALC 85 02/22/2021   LDLDIRECT 148 (H) 11/15/2007   TRIG 100 02/22/2021   CHOLHDL 5.2 (H) 01/26/2016   Lab Results  Component Value Date   VD25OH 70.4 02/22/2021   VD25OH 52.1 09/16/2020   VD25OH 58.3 02/27/2020   Lab Results  Component Value Date   WBC 7.3 11/30/2017   HGB 15.0 11/30/2017   HCT 46.8 11/30/2017   MCV 86 11/30/2017   PLT 293 11/30/2017   No results found for: IRON, TIBC, FERRITIN  Obesity Behavioral Intervention:   Approximately 15 minutes were spent on the discussion below.  ASK: We discussed the diagnosis of obesity with [name] today and Rayshawn agreed to give [name] permission to discuss obesity behavioral modification therapy today.  ASSESS: Deray has the diagnosis of obesity and his BMI today is 40.38. Malaki is in the action stage of change.   ADVISE: Xzaiver was educated on the multiple health risks of obesity as well as the benefit of weight loss to improve his health. He was advised of the need for long term treatment and the importance of lifestyle modifications to improve his current health and to decrease his risk of future health problems.  AGREE: Multiple dietary modification options and treatment options were discussed and Jabar agreed to follow the recommendations documented in the above note.  ARRANGE: Cliff was educated on the importance of frequent visits to treat obesity as outlined per CMS and USPSTF  guidelines and agreed to schedule his next follow up appointment today.  Attestation Statements:   Reviewed by clinician on day of visit: allergies, medications, problem list, medical history, surgical history, family history, social history, and previous encounter notes.   I, Trixie Dredge, am acting as transcriptionist for [name], MD.  I have reviewed the above documentation for accuracy and completeness, and I agree with the above. -  [name], MD

## 2021-03-15 ENCOUNTER — Encounter (INDEPENDENT_AMBULATORY_CARE_PROVIDER_SITE_OTHER): Payer: Self-pay | Admitting: Family Medicine

## 2021-03-15 ENCOUNTER — Other Ambulatory Visit: Payer: Self-pay

## 2021-03-15 ENCOUNTER — Ambulatory Visit (INDEPENDENT_AMBULATORY_CARE_PROVIDER_SITE_OTHER): Payer: Medicare HMO | Admitting: Family Medicine

## 2021-03-15 VITALS — BP 140/82 | HR 70 | Temp 97.8°F | Ht 73.0 in | Wt 304.0 lb

## 2021-03-15 DIAGNOSIS — E1169 Type 2 diabetes mellitus with other specified complication: Secondary | ICD-10-CM

## 2021-03-15 DIAGNOSIS — F3289 Other specified depressive episodes: Secondary | ICD-10-CM | POA: Diagnosis not present

## 2021-03-15 DIAGNOSIS — E559 Vitamin D deficiency, unspecified: Secondary | ICD-10-CM | POA: Diagnosis not present

## 2021-03-15 DIAGNOSIS — I10 Essential (primary) hypertension: Secondary | ICD-10-CM

## 2021-03-15 DIAGNOSIS — Z6841 Body Mass Index (BMI) 40.0 and over, adult: Secondary | ICD-10-CM

## 2021-03-15 DIAGNOSIS — E7849 Other hyperlipidemia: Secondary | ICD-10-CM

## 2021-03-15 DIAGNOSIS — K5900 Constipation, unspecified: Secondary | ICD-10-CM

## 2021-03-15 MED ORDER — METOPROLOL SUCCINATE ER 25 MG PO TB24: 12.5000 mg | ORAL_TABLET | Freq: Every day | ORAL | 0 refills | Status: DC

## 2021-03-15 MED ORDER — ESCITALOPRAM OXALATE 20 MG PO TABS: 20.0000 mg | ORAL_TABLET | Freq: Every day | ORAL | 0 refills | Status: DC

## 2021-03-15 MED ORDER — VITAMIN D (ERGOCALCIFEROL) 1.25 MG (50000 UNIT) PO CAPS: 50000.0000 [IU] | ORAL_CAPSULE | ORAL | 0 refills | Status: DC

## 2021-03-15 MED ORDER — OZEMPIC (0.25 OR 0.5 MG/DOSE) 2 MG/1.5ML ~~LOC~~ SOPN: 0.5000 mg | PEN_INJECTOR | SUBCUTANEOUS | 0 refills | Status: DC

## 2021-03-15 MED ORDER — LOSARTAN POTASSIUM 50 MG PO TABS: ORAL_TABLET | ORAL | 0 refills | Status: DC

## 2021-03-15 MED ORDER — BUPROPION HCL ER (SR) 200 MG PO TB12: 200.0000 mg | ORAL_TABLET | Freq: Every morning | ORAL | 0 refills | Status: DC

## 2021-03-15 MED ORDER — METFORMIN HCL 1000 MG PO TABS: 1000.0000 mg | ORAL_TABLET | Freq: Every day | ORAL | 0 refills | Status: DC

## 2021-03-15 MED ORDER — ROSUVASTATIN CALCIUM 40 MG PO TABS: ORAL_TABLET | ORAL | 0 refills | Status: DC

## 2021-03-16 NOTE — Progress Notes (Signed)
Chief Complaint:   OBESITY [name] [name] is here to discuss his progress with his obesity treatment plan along with follow-up of his obesity related diagnoses. [name] [name] is on keeping a food journal and adhering to recommended goals of 1500-1800 calories and 100+ grams of protein daily and states he is following his eating plan approximately 90-95% of the time. Keithen states he is walking for 15-20 minutes 5 times per week.  Today's visit was #: 26 Starting weight: 340 lbs Starting date: 12/28/2016 Today's weight: 304 lbs Today's date: 03/15/2021 Total lbs lost to date: 36 Total lbs lost since last in-office visit: 2  Interim History: [name] [name] continues to do well with diet, exercise, and weight loss. He is working on meeting his calorie and protein goals.  Subjective:   1. Vitamin D deficiency [name] [name]'s Vit D level is stable on Vit D q 2 weeks. He denies over-replacement. I discussed labs with the patient today.  2. Essential hypertension [name] [name]'s blood pressure is borderline elevated today, but is normally well controlled. His GFR is within normal limits, and he denies chest pain. I discussed labs with the patient today.  3. Other hyperlipidemia [name] [name]'s LDL and triglycerides are improving with Crestor and Ozempic. He denies chest pain. I discussed labs with the patient today.  4. Type 2 diabetes mellitus with other specified complication, without long-term current use of insulin (HCC) [name] [name]'s A1c decreased to 6.3 on Ozempic. He increased Ozempic to 0.5 mg q weekly at his last visit, and he is doing well on this dose. He had 1 episode of feeling hypoglycemic when he went too long without eating. I discussed labs with the patient today.  5. Other depression with emotional eating [name] [name]'s mood is stable on his medications. He is still dealing with stress at work, but he seems to be in a healthier place.  Assessment/Plan:   1. Vitamin D deficiency Low Vitamin D level contributes to  fatigue and are associated with obesity, breast, and colon cancer. We will refill prescription Vitamin D 50,000 IU every 14 days for 1 month. [name] will follow-up for routine testing of Vitamin D, at least 2-3 times per year to avoid over-replacement.  - Vitamin D, Ergocalciferol, (DRISDOL) 1.25 MG (50000 UNIT) CAPS capsule; Take 1 capsule (50,000 Units total) by mouth every 14 (fourteen) days.  Dispense: 2 capsule; Refill: 0  2. Essential hypertension [name] is working on healthy weight loss and exercise to improve blood pressure control. We will refill losartan and Toprol for 90 days with no refills, and will watch for signs of hypotension as he continues his lifestyle modifications.  - losartan (COZAAR) 50 MG tablet; TAKE 1 TABLET BY MOUTH ONCE A DAY. TAKE WITH HCTZ.  Dispense: 90 tablet; Refill: 0 - metoprolol succinate (TOPROL-XL) 25 MG 24 hr tablet; Take 0.5 tablets (12.5 mg total) by mouth daily.  Dispense: 90 tablet; Refill: 0  3. Other hyperlipidemia Cardiovascular risk and specific lipid/LDL goals reviewed. We discussed several lifestyle modifications today. We will refill Crestor for 90 days with no refills. [name] [name] will continue to work on diet, exercise and weight loss efforts. Orders and follow up as documented in patient record.   - rosuvastatin (CRESTOR) 40 MG tablet; Take 1 tablet by mouth daily  Dispense: 90 tablet; Refill: 0  4. Type 2 diabetes mellitus with other specified complication, without long-term current use of insulin (HCC) [name] [name] will continue his medications, and we will refill Ozempic for 1 month, and we will refill metformin for 90  days with no refills. Hypoglycemic handout was given to the patient today, and he is to carry glucose tablets with him. Good blood sugar control is important to decrease the likelihood of diabetic complications such as nephropathy, neuropathy, limb loss, blindness, coronary artery disease, and death. Intensive lifestyle modification  including diet, exercise and weight loss are the first line of treatment for diabetes.   - Semaglutide,0.25 or 0.'5MG'$ /DOS, (OZEMPIC, 0.25 OR 0.5 MG/DOSE,) 2 MG/1.5ML SOPN; Inject 0.5 mg into the skin every 7 (seven) days.  Dispense: 1.5 mL; Refill: 0 - metFORMIN (GLUCOPHAGE) 1000 MG tablet; Take 1 tablet (1,000 mg total) by mouth daily with breakfast.  Dispense: 90 tablet; Refill: 0  5. Other depression with emotional eating Behavior modification techniques were discussed today to help [name] [name] deal with his emotional/non-hunger eating behaviors. We will refill Wellbutrin SR, and Lexapro for 90 days with no refills. Orders and follow up as documented in patient record.   - escitalopram (LEXAPRO) 20 MG tablet; Take 1 tablet (20 mg total) by mouth daily.  Dispense: 90 tablet; Refill: 0 - buPROPion (WELLBUTRIN SR) 200 MG 12 hr tablet; Take 1 tablet (200 mg total) by mouth every morning.  Dispense: 90 tablet; Refill: 0  6. Obesity with current BMI 40.2 [name] [name] is currently in the action stage of change. As such, his goal is to continue with weight loss efforts. He has agreed to keeping a food journal and adhering to recommended goals of 1500-1800 calories and 100+ grams of protein daily.   Exercise goals: As is.  Behavioral modification strategies: increasing water intake, no skipping meals, and meal planning and cooking strategies.  [name] [name] has agreed to follow-up with our clinic in 3 to 4 weeks. He was informed of the importance of frequent follow-up visits to maximize his success with intensive lifestyle modifications for his multiple health conditions.   Objective:   Blood pressure 140/82, pulse 70, temperature 97.8 F (36.6 C), height '6\' 1"'$  (1.854 m), weight (!) 304 lb (137.9 kg), SpO2 98 %. Body mass index is 40.11 kg/m.  General: Cooperative, alert, well developed, in no acute distress. HEENT: Conjunctivae and lids unremarkable. Cardiovascular: Regular rhythm.  Lungs: Normal work of  breathing. Neurologic: No focal deficits.   Lab Results  Component Value Date   CREATININE 1.20 02/22/2021   BUN 23 02/22/2021   NA 139 02/22/2021   K 4.6 02/22/2021   CL 103 02/22/2021   CO2 23 02/22/2021   Lab Results  Component Value Date   ALT 31 02/22/2021   AST 20 02/22/2021   ALKPHOS 63 02/22/2021   BILITOT 0.5 02/22/2021   Lab Results  Component Value Date   HGBA1C 6.3 (H) 02/22/2021   HGBA1C 6.6 (H) 09/16/2020   HGBA1C 6.1 11/28/2019   HGBA1C 6.0 (H) 08/14/2019   HGBA1C 6.0 (H) 03/25/2019   Lab Results  Component Value Date   INSULIN 11.7 02/22/2021   INSULIN 9.1 09/16/2020   INSULIN 10.0 08/14/2019   INSULIN 12.4 03/25/2019   INSULIN 19.2 09/24/2018   Lab Results  Component Value Date   TSH 1.500 03/25/2019   Lab Results  Component Value Date   CHOL 143 02/22/2021   HDL 39 (L) 02/22/2021   LDLCALC 85 02/22/2021   LDLDIRECT 148 (H) 11/15/2007   TRIG 100 02/22/2021   CHOLHDL 5.2 (H) 01/26/2016   Lab Results  Component Value Date   VD25OH 70.4 02/22/2021   VD25OH 52.1 09/16/2020   VD25OH 58.3 02/27/2020   Lab Results  Component  Value Date   WBC 7.3 11/30/2017   HGB 15.0 11/30/2017   HCT 46.8 11/30/2017   MCV 86 11/30/2017   PLT 293 11/30/2017   No results found for: IRON, TIBC, FERRITIN  Attestation Statements:   Reviewed by clinician on day of visit: allergies, medications, problem list, medical history, surgical history, family history, social history, and previous encounter notes.  Time spent on visit including pre-visit chart review and post-visit care and charting was 40 minutes.    I, Trixie Dredge, am acting as transcriptionist for [name], MD.  I have reviewed the above documentation for accuracy and completeness, and I agree with the above. -  [name], MD

## 2021-03-29 ENCOUNTER — Other Ambulatory Visit (INDEPENDENT_AMBULATORY_CARE_PROVIDER_SITE_OTHER): Payer: Self-pay | Admitting: Bariatrics

## 2021-03-29 ENCOUNTER — Other Ambulatory Visit (INDEPENDENT_AMBULATORY_CARE_PROVIDER_SITE_OTHER): Payer: Self-pay | Admitting: Family Medicine

## 2021-03-29 DIAGNOSIS — E1169 Type 2 diabetes mellitus with other specified complication: Secondary | ICD-10-CM

## 2021-03-29 DIAGNOSIS — N3941 Urge incontinence: Secondary | ICD-10-CM

## 2021-03-29 DIAGNOSIS — I10 Essential (primary) hypertension: Secondary | ICD-10-CM

## 2021-03-30 ENCOUNTER — Encounter (INDEPENDENT_AMBULATORY_CARE_PROVIDER_SITE_OTHER): Payer: Self-pay | Admitting: Family Medicine

## 2021-03-30 NOTE — Telephone Encounter (Signed)
Dr.Beasley 

## 2021-03-30 NOTE — Telephone Encounter (Signed)
LAST APPOINTMENT DATE: 03/15/21 NEXT APPOINTMENT DATE: 04/12/21  Accu check strip, mybetriq, solifenacin, furosemide 90 day supply  [name], [place] - [place] [address] Phone: [phone] Fax: [phone]  Patient is requesting a refill of the following medications: Requested Prescriptions    No prescriptions requested or ordered in this encounter    Date last filled: 12/24/20 Previously prescribed by [facility]  Lab Results  Component Value Date   HGBA1C 6.3 (H) 02/22/2021   HGBA1C 6.6 (H) 09/16/2020   HGBA1C 6.1 11/28/2019   Lab Results  Component Value Date   LDLCALC 85 02/22/2021   CREATININE 1.20 02/22/2021   Lab Results  Component Value Date   VD25OH 70.4 02/22/2021   VD25OH 52.1 09/16/2020   VD25OH 58.3 02/27/2020    BP Readings from Last 3 Encounters:  03/15/21 140/82  02/22/21 140/75  02/08/21 126/79

## 2021-04-01 ENCOUNTER — Other Ambulatory Visit (INDEPENDENT_AMBULATORY_CARE_PROVIDER_SITE_OTHER): Payer: Self-pay | Admitting: Emergency Medicine

## 2021-04-01 DIAGNOSIS — N3941 Urge incontinence: Secondary | ICD-10-CM

## 2021-04-01 DIAGNOSIS — I10 Essential (primary) hypertension: Secondary | ICD-10-CM

## 2021-04-01 MED ORDER — SOLIFENACIN SUCCINATE 5 MG PO TABS: 5.0000 mg | ORAL_TABLET | Freq: Every day | ORAL | 0 refills | Status: DC

## 2021-04-01 MED ORDER — ACCU-CHEK AVIVA PLUS VI STRP: 1.0000 | ORAL_STRIP | Freq: Two times a day (BID) | 0 refills | Status: DC

## 2021-04-01 MED ORDER — MIRABEGRON ER 50 MG PO TB24: 50.0000 mg | ORAL_TABLET | Freq: Every day | ORAL | 0 refills | Status: DC

## 2021-04-01 MED ORDER — FUROSEMIDE 40 MG PO TABS: 40.0000 mg | ORAL_TABLET | Freq: Every day | ORAL | 0 refills | Status: DC

## 2021-04-01 NOTE — Telephone Encounter (Signed)
LAST APPOINTMENT DATE: 03/15/21 NEXT APPOINTMENT DATE: 04/12/21  90 day supply    [place], [place] - [place] [address] Phone: [phone] Fax: [phone]  Patient is requesting a refill of the following medications: Requested Prescriptions   Pending Prescriptions Disp Refills   mirabegron ER (MYRBETRIQ) 50 MG TB24 tablet 90 tablet 0    Sig: Take 1 tablet (50 mg total) by mouth daily.   solifenacin (VESICARE) 5 MG tablet 90 tablet 0    Sig: Take 1 tablet (5 mg total) by mouth daily.    Date last filled: 12/24/20 Previously prescribed by [facility]   Lab Results  Component Value Date   HGBA1C 6.3 (H) 02/22/2021   HGBA1C 6.6 (H) 09/16/2020   HGBA1C 6.1 11/28/2019   Lab Results  Component Value Date   LDLCALC 85 02/22/2021   CREATININE 1.20 02/22/2021   Lab Results  Component Value Date   VD25OH 70.4 02/22/2021   VD25OH 52.1 09/16/2020   VD25OH 58.3 02/27/2020    BP Readings from Last 3 Encounters:  03/15/21 140/82  02/22/21 140/75  02/08/21 126/79

## 2021-04-01 NOTE — Telephone Encounter (Signed)
LAST APPOINTMENT DATE: 03/15/21 NEXT APPOINTMENT DATE: 04/12/21    [facility] - [place], [place] [address] Phone: [phone] Fax: [phone]  Patient is requesting a refill of the following medications: Requested Prescriptions   Pending Prescriptions Disp Refills   furosemide (LASIX) 40 MG tablet 90 tablet 0    Sig: Take 1 tablet (40 mg total) by mouth daily.    Date last filled: 12/24/20 Previously prescribed by [facility]  Lab Results  Component Value Date   HGBA1C 6.3 (H) 02/22/2021   HGBA1C 6.6 (H) 09/16/2020   HGBA1C 6.1 11/28/2019   Lab Results  Component Value Date   LDLCALC 85 02/22/2021   CREATININE 1.20 02/22/2021   Lab Results  Component Value Date   VD25OH 70.4 02/22/2021   VD25OH 52.1 09/16/2020   VD25OH 58.3 02/27/2020    BP Readings from Last 3 Encounters:  03/15/21 140/82  02/22/21 140/75  02/08/21 126/79

## 2021-04-01 NOTE — Telephone Encounter (Signed)
Ok to rf x 90 days but please contact pt and let him know these medications really need to be rx from his PCP in the future.Thx!

## 2021-04-01 NOTE — Telephone Encounter (Signed)
Rx has been sent and patient has been notified

## 2021-04-12 ENCOUNTER — Ambulatory Visit (INDEPENDENT_AMBULATORY_CARE_PROVIDER_SITE_OTHER): Payer: Medicare HMO | Admitting: Family Medicine

## 2021-04-12 ENCOUNTER — Other Ambulatory Visit: Payer: Self-pay

## 2021-04-12 ENCOUNTER — Encounter (INDEPENDENT_AMBULATORY_CARE_PROVIDER_SITE_OTHER): Payer: Self-pay | Admitting: Family Medicine

## 2021-04-12 VITALS — BP 145/85 | HR 73 | Temp 98.3°F | Ht 73.0 in | Wt 305.0 lb

## 2021-04-12 DIAGNOSIS — E559 Vitamin D deficiency, unspecified: Secondary | ICD-10-CM | POA: Diagnosis not present

## 2021-04-12 DIAGNOSIS — E1169 Type 2 diabetes mellitus with other specified complication: Secondary | ICD-10-CM | POA: Diagnosis not present

## 2021-04-12 DIAGNOSIS — Z6841 Body Mass Index (BMI) 40.0 and over, adult: Secondary | ICD-10-CM

## 2021-04-12 MED ORDER — VITAMIN D (ERGOCALCIFEROL) 1.25 MG (50000 UNIT) PO CAPS: 50000.0000 [IU] | ORAL_CAPSULE | ORAL | 0 refills | Status: DC

## 2021-04-13 DIAGNOSIS — S63501D Unspecified sprain of right wrist, subsequent encounter: Secondary | ICD-10-CM | POA: Diagnosis not present

## 2021-04-13 NOTE — Progress Notes (Signed)
Chief Complaint:   OBESITY [name] [name] is here to discuss his progress with his obesity treatment plan along with follow-up of his obesity related diagnoses. [name] [name] is on keeping a food journal and adhering to recommended goals of 1500-1800 calories and 100+ grams of protein daily and states he is following his eating plan approximately 75% of the time. [name] [name] states he is walking for 15-20 minutes 6 times per week.  Today's visit was #: 68 Starting weight: 340 lbs Starting date: 12/28/2016 Today's weight: 305 lbs Today's date: 04/12/2021 Total lbs lost to date: 35 Total lbs lost since last in-office visit: 0  Interim History: [name] [name] did some traveling this weekend and he struggled to stay on plan. He is ready to get back on track with journaling and walking. His hunger is mostly controlled.  Subjective:   1. Type 2 diabetes mellitus with other specified complication, without long-term current use of insulin (HCC) [name] [name]'s fasting BGs range mostly between 130-140 and he is stable on his medications.   2. Vitamin D deficiency [name] [name] is stable on Vit D, and he needs a refill today.  Assessment/Plan:   1. Type 2 diabetes mellitus with other specified complication, without long-term current use of insulin ([place]) [name] [name] will continue Ozempic and metformin, and we will follow up in 2-3 weeks. Good blood sugar control is important to decrease the likelihood of diabetic complications such as nephropathy, neuropathy, limb loss, blindness, coronary artery disease, and death. Intensive lifestyle modification including diet, exercise and weight loss are the first line of treatment for diabetes.   2. Vitamin D deficiency Low Vitamin D level contributes to fatigue and are associated with obesity, breast, and colon cancer. We will refill prescription Vitamin D for 1 month. [name] [name] will follow-up for routine testing of Vitamin D, at least 2-3 times per year to avoid over-replacement.  - Vitamin D,  Ergocalciferol, (DRISDOL) 1.25 MG (50000 UNIT) CAPS capsule; Take 1 capsule (50,000 Units total) by mouth every 14 (fourteen) days.  Dispense: 2 capsule; Refill: 0  3. Obesity with current BMI 40.3 [name] [name] is currently in the action stage of change. As such, his goal is to continue with weight loss efforts. He has agreed to keeping a food journal and adhering to recommended goals of 1500-1800 calories and 100+ grams of protein daily.   Exercise goals: As is.   Behavioral modification strategies: increasing lean protein intake and decreasing sodium intake.  [name] [name] has agreed to follow-up with our clinic in 2 to 3 weeks. He was informed of the importance of frequent follow-up visits to maximize his success with intensive lifestyle modifications for his multiple health conditions.   Objective:   Blood pressure (!) 145/85, pulse 73, temperature 98.3 F (36.8 C), height '6\' 1"'$  (1.854 m), weight (!) 305 lb (138.3 kg), SpO2 93 %. Body mass index is 40.24 kg/m.  General: Cooperative, alert, well developed, in no acute distress. HEENT: Conjunctivae and lids unremarkable. Cardiovascular: Regular rhythm.  Lungs: Normal work of breathing. Neurologic: No focal deficits.   Lab Results  Component Value Date   CREATININE 1.20 02/22/2021   BUN 23 02/22/2021   NA 139 02/22/2021   K 4.6 02/22/2021   CL 103 02/22/2021   CO2 23 02/22/2021   Lab Results  Component Value Date   ALT 31 02/22/2021   AST 20 02/22/2021   ALKPHOS 63 02/22/2021   BILITOT 0.5 02/22/2021   Lab Results  Component Value Date   HGBA1C 6.3 (H) 02/22/2021  HGBA1C 6.6 (H) 09/16/2020   HGBA1C 6.1 11/28/2019   HGBA1C 6.0 (H) 08/14/2019   HGBA1C 6.0 (H) 03/25/2019   Lab Results  Component Value Date   INSULIN 11.7 02/22/2021   INSULIN 9.1 09/16/2020   INSULIN 10.0 08/14/2019   INSULIN 12.4 03/25/2019   INSULIN 19.2 09/24/2018   Lab Results  Component Value Date   TSH 1.500 03/25/2019   Lab Results  Component  Value Date   CHOL 143 02/22/2021   HDL 39 (L) 02/22/2021   LDLCALC 85 02/22/2021   LDLDIRECT 148 (H) 11/15/2007   TRIG 100 02/22/2021   CHOLHDL 5.2 (H) 01/26/2016   Lab Results  Component Value Date   VD25OH 70.4 02/22/2021   VD25OH 52.1 09/16/2020   VD25OH 58.3 02/27/2020   Lab Results  Component Value Date   WBC 7.3 11/30/2017   HGB 15.0 11/30/2017   HCT 46.8 11/30/2017   MCV 86 11/30/2017   PLT 293 11/30/2017   No results found for: IRON, TIBC, FERRITIN  Obesity Behavioral Intervention:   Approximately 15 minutes were spent on the discussion below.  ASK: We discussed the diagnosis of obesity with [name] [name] today and [name] [name] agreed to give [name] permission to discuss obesity behavioral modification therapy today.  ASSESS: Kiansh has the diagnosis of obesity and his BMI today is 40.3. [name] [name] is in the action stage of change.   ADVISE: [name] [name] was educated on the multiple health risks of obesity as well as the benefit of weight loss to improve his health. He was advised of the need for long term treatment and the importance of lifestyle modifications to improve his current health and to decrease his risk of future health problems.  AGREE: Multiple dietary modification options and treatment options were discussed and [name] [name] agreed to follow the recommendations documented in the above note.  ARRANGE: [name] [name] was educated on the importance of frequent visits to treat obesity as outlined per CMS and USPSTF guidelines and agreed to schedule his next follow up appointment today.  Attestation Statements:   Reviewed by clinician on day of visit: allergies, medications, problem list, medical history, surgical history, family history, social history, and previous encounter notes.   I, Trixie Dredge, am acting as transcriptionist for [name], MD.  I have reviewed the above documentation for accuracy and completeness, and I agree with the above. -  [name], MD

## 2021-04-17 ENCOUNTER — Other Ambulatory Visit (INDEPENDENT_AMBULATORY_CARE_PROVIDER_SITE_OTHER): Payer: Self-pay | Admitting: Family Medicine

## 2021-04-17 ENCOUNTER — Encounter (INDEPENDENT_AMBULATORY_CARE_PROVIDER_SITE_OTHER): Payer: Self-pay | Admitting: Family Medicine

## 2021-04-17 DIAGNOSIS — E1169 Type 2 diabetes mellitus with other specified complication: Secondary | ICD-10-CM

## 2021-04-19 ENCOUNTER — Telehealth (INDEPENDENT_AMBULATORY_CARE_PROVIDER_SITE_OTHER): Payer: Self-pay

## 2021-04-19 MED ORDER — OZEMPIC (0.25 OR 0.5 MG/DOSE) 2 MG/1.5ML ~~LOC~~ SOPN: 0.5000 mg | PEN_INJECTOR | SUBCUTANEOUS | 0 refills | Status: DC

## 2021-04-19 NOTE — Telephone Encounter (Signed)
Pt last seen by Dr. Beasley.  

## 2021-04-19 NOTE — Telephone Encounter (Signed)
Pt is requesting that his Ozempic refill be called into his pharmacy ([facility]) as soon as possible.  He is leaving town today.  He apologizes that his request has come so late.  He would like to pick-up from the pharmacy this morning.  Thank you

## 2021-04-19 NOTE — Telephone Encounter (Signed)
Last OV with Dr. Beasley 

## 2021-04-19 NOTE — Telephone Encounter (Signed)
Medication request sent to Provider.

## 2021-04-19 NOTE — Telephone Encounter (Signed)
LAST APPOINTMENT DATE: 04/12/21 NEXT APPOINTMENT DATE: 05/03/21   [facility] - [place], [place] - [place] [address] Phone: [phone] Fax: [phone]  Patient is requesting a refill of the following medications: Requested Prescriptions   Pending Prescriptions Disp Refills   Semaglutide,0.25 or 0.'5MG'$ /DOS, (OZEMPIC, 0.25 OR 0.5 MG/DOSE,) 2 MG/1.5ML SOPN 1.5 mL 0    Sig: Inject 0.5 mg into the skin every 7 (seven) days.    Date last filled: 03/15/21 Previously prescribed by [facility]  Lab Results  Component Value Date   HGBA1C 6.3 (H) 02/22/2021   HGBA1C 6.6 (H) 09/16/2020   HGBA1C 6.1 11/28/2019   Lab Results  Component Value Date   LDLCALC 85 02/22/2021   CREATININE 1.20 02/22/2021   Lab Results  Component Value Date   VD25OH 70.4 02/22/2021   VD25OH 52.1 09/16/2020   VD25OH 58.3 02/27/2020    BP Readings from Last 3 Encounters:  04/12/21 (!) 145/85  03/15/21 140/82  02/22/21 140/75

## 2021-04-20 NOTE — Telephone Encounter (Signed)
error 

## 2021-04-26 ENCOUNTER — Other Ambulatory Visit: Payer: Self-pay

## 2021-04-26 DIAGNOSIS — F3289 Other specified depressive episodes: Secondary | ICD-10-CM

## 2021-04-26 DIAGNOSIS — E7849 Other hyperlipidemia: Secondary | ICD-10-CM

## 2021-04-26 MED ORDER — ROSUVASTATIN CALCIUM 40 MG PO TABS: ORAL_TABLET | ORAL | 0 refills | Status: DC

## 2021-04-26 MED ORDER — ESCITALOPRAM OXALATE 20 MG PO TABS: 20.0000 mg | ORAL_TABLET | Freq: Every day | ORAL | 0 refills | Status: DC

## 2021-04-27 ENCOUNTER — Ambulatory Visit (INDEPENDENT_AMBULATORY_CARE_PROVIDER_SITE_OTHER): Payer: Medicare HMO

## 2021-04-27 ENCOUNTER — Other Ambulatory Visit: Payer: Self-pay

## 2021-04-27 DIAGNOSIS — Z23 Encounter for immunization: Secondary | ICD-10-CM

## 2021-04-27 NOTE — Progress Notes (Signed)
Patient presents to nurse clinic for flu vaccination. Administered in LD, site unremarkable. Tolerated injection well.   Talbot Grumbling, RN

## 2021-05-03 ENCOUNTER — Ambulatory Visit (INDEPENDENT_AMBULATORY_CARE_PROVIDER_SITE_OTHER): Payer: Medicare HMO | Admitting: Family Medicine

## 2021-05-04 DIAGNOSIS — N3946 Mixed incontinence: Secondary | ICD-10-CM | POA: Diagnosis not present

## 2021-05-04 DIAGNOSIS — Z8546 Personal history of malignant neoplasm of prostate: Secondary | ICD-10-CM | POA: Diagnosis not present

## 2021-05-17 ENCOUNTER — Other Ambulatory Visit: Payer: Self-pay

## 2021-05-17 ENCOUNTER — Encounter (INDEPENDENT_AMBULATORY_CARE_PROVIDER_SITE_OTHER): Payer: Self-pay | Admitting: Family Medicine

## 2021-05-17 ENCOUNTER — Ambulatory Visit (INDEPENDENT_AMBULATORY_CARE_PROVIDER_SITE_OTHER): Payer: Medicare HMO | Admitting: Family Medicine

## 2021-05-17 VITALS — BP 135/71 | HR 65 | Temp 97.9°F | Ht 73.0 in | Wt 302.0 lb

## 2021-05-17 DIAGNOSIS — E1169 Type 2 diabetes mellitus with other specified complication: Secondary | ICD-10-CM | POA: Diagnosis not present

## 2021-05-17 DIAGNOSIS — Z6841 Body Mass Index (BMI) 40.0 and over, adult: Secondary | ICD-10-CM | POA: Diagnosis not present

## 2021-05-17 DIAGNOSIS — E559 Vitamin D deficiency, unspecified: Secondary | ICD-10-CM

## 2021-05-17 MED ORDER — VITAMIN D (ERGOCALCIFEROL) 1.25 MG (50000 UNIT) PO CAPS: 50000.0000 [IU] | ORAL_CAPSULE | ORAL | 0 refills | Status: DC

## 2021-05-17 MED ORDER — OZEMPIC (0.25 OR 0.5 MG/DOSE) 2 MG/1.5ML ~~LOC~~ SOPN: 0.5000 mg | PEN_INJECTOR | SUBCUTANEOUS | 0 refills | Status: DC

## 2021-05-17 NOTE — Progress Notes (Signed)
Chief Complaint:   OBESITY Leandrew is here to discuss his progress with his obesity treatment plan along with follow-up of his obesity related diagnoses. Zeph is on keeping a food journal and adhering to recommended goals of 1500-1800 calories and 100+ grams of protein daily and states he is following his eating plan approximately 75% of the time. Naasir states he is walking for 30 minutes 6 times per week.  Today's visit was #: 12 Starting weight: 340 lbs Starting date: 12/28/2016 Today's weight: 302 lbs Today's date: 05/17/2021 Total lbs lost to date: 38 Total lbs lost since last in-office visit: 3  Interim History: Roldan continues to do well with weight loss. Hs hunger is controlled and he has sometimes struggled to eat his dinner. He will be traveling soon and he has already made some good travel strategies.  Subjective:   1. Type 2 diabetes mellitus with other specified complication, without long-term current use of insulin ([place]) Kalum notes decreased polyphagia on Ozempic. He denies signs of hypoglycemia, and no nausea or vomiting.  2. Vitamin D deficiency Prabhav is stable on Vit D, and he denies nausea or vomiting.  Assessment/Plan:   1. Type 2 diabetes mellitus with other specified complication, without long-term current use of insulin ([place]) Dallan will continue Ozempic, and we will refill for 1 month. Good blood sugar control is important to decrease the likelihood of diabetic complications such as nephropathy, neuropathy, limb loss, blindness, coronary artery disease, and death. Intensive lifestyle modification including diet, exercise and weight loss are the first line of treatment for diabetes.   - Semaglutide,0.25 or 0.5MG /DOS, (OZEMPIC, 0.25 OR 0.5 MG/DOSE,) 2 MG/1.5ML SOPN; Inject 0.5 mg into the skin every 7 (seven) days.  Dispense: 1.5 mL; Refill: 0  2. Vitamin D deficiency Low Vitamin D level contributes to fatigue and are associated with obesity, breast,  and colon cancer. We will refill prescription Vitamin D for 1 month. Abrahim will follow-up for routine testing of Vitamin D, at least 2-3 times per year to avoid over-replacement.  - Vitamin D, Ergocalciferol, (DRISDOL) 1.25 MG (50000 UNIT) CAPS capsule; Take 1 capsule (50,000 Units total) by mouth every 14 (fourteen) days.  Dispense: 2 capsule; Refill: 0  3. Obesity with current BMI 39.9 Laquinton is currently in the action stage of change. As such, his goal is to continue with weight loss efforts. He has agreed to keeping a food journal and adhering to recommended goals of 1500-1800 calories and 100+ grams of protein daily.   Exercise goals: As is.  Behavioral modification strategies: increasing lean protein intake and travel eating strategies.  Haidyn has agreed to follow-up with our clinic in 2 to 3 weeks. He was informed of the importance of frequent follow-up visits to maximize his success with intensive lifestyle modifications for his multiple health conditions.   Objective:   Blood pressure 135/71, pulse 65, temperature 97.9 F (36.6 C), height 6\' 1"  (1.854 m), weight (!) 302 lb (137 kg), SpO2 96 %. Body mass index is 39.84 kg/m.  General: Cooperative, alert, well developed, in no acute distress. HEENT: Conjunctivae and lids unremarkable. Cardiovascular: Regular rhythm.  Lungs: Normal work of breathing. Neurologic: No focal deficits.   Lab Results  Component Value Date   CREATININE 1.20 02/22/2021   BUN 23 02/22/2021   NA 139 02/22/2021   K 4.6 02/22/2021   CL 103 02/22/2021   CO2 23 02/22/2021   Lab Results  Component Value Date   ALT 31 02/22/2021  AST 20 02/22/2021   ALKPHOS 63 02/22/2021   BILITOT 0.5 02/22/2021   Lab Results  Component Value Date   HGBA1C 6.3 (H) 02/22/2021   HGBA1C 6.6 (H) 09/16/2020   HGBA1C 6.1 11/28/2019   HGBA1C 6.0 (H) 08/14/2019   HGBA1C 6.0 (H) 03/25/2019   Lab Results  Component Value Date   INSULIN 11.7 02/22/2021   INSULIN  9.1 09/16/2020   INSULIN 10.0 08/14/2019   INSULIN 12.4 03/25/2019   INSULIN 19.2 09/24/2018   Lab Results  Component Value Date   TSH 1.500 03/25/2019   Lab Results  Component Value Date   CHOL 143 02/22/2021   HDL 39 (L) 02/22/2021   LDLCALC 85 02/22/2021   LDLDIRECT 148 (H) 11/15/2007   TRIG 100 02/22/2021   CHOLHDL 5.2 (H) 01/26/2016   Lab Results  Component Value Date   VD25OH 70.4 02/22/2021   VD25OH 52.1 09/16/2020   VD25OH 58.3 02/27/2020   Lab Results  Component Value Date   WBC 7.3 11/30/2017   HGB 15.0 11/30/2017   HCT 46.8 11/30/2017   MCV 86 11/30/2017   PLT 293 11/30/2017   No results found for: IRON, TIBC, FERRITIN  Obesity Behavioral Intervention:   Approximately 15 minutes were spent on the discussion below.  ASK: We discussed the diagnosis of obesity with [name] today and Dino agreed to give [name] permission to discuss obesity behavioral modification therapy today.  ASSESS: Gaudencio has the diagnosis of obesity and his BMI today is 39.9. Amador is in the action stage of change.   ADVISE: Oluwasemilore was educated on the multiple health risks of obesity as well as the benefit of weight loss to improve his health. He was advised of the need for long term treatment and the importance of lifestyle modifications to improve his current health and to decrease his risk of future health problems.  AGREE: Multiple dietary modification options and treatment options were discussed and Luther agreed to follow the recommendations documented in the above note.  ARRANGE: Isaul was educated on the importance of frequent visits to treat obesity as outlined per CMS and USPSTF guidelines and agreed to schedule his next follow up appointment today.  Attestation Statements:   Reviewed by clinician on day of visit: allergies, medications, problem list, medical history, surgical history, family history, social history, and previous encounter notes.   I, Trixie Dredge, am  acting as transcriptionist for [name], MD.  I have reviewed the above documentation for accuracy and completeness, and I agree with the above. -  [name], MD

## 2021-06-09 ENCOUNTER — Other Ambulatory Visit: Payer: Self-pay

## 2021-06-09 ENCOUNTER — Ambulatory Visit (INDEPENDENT_AMBULATORY_CARE_PROVIDER_SITE_OTHER): Payer: Medicare HMO | Admitting: Family Medicine

## 2021-06-09 ENCOUNTER — Encounter (INDEPENDENT_AMBULATORY_CARE_PROVIDER_SITE_OTHER): Payer: Self-pay | Admitting: Family Medicine

## 2021-06-09 VITALS — BP 136/72 | HR 71 | Temp 97.9°F | Ht 73.0 in | Wt 301.0 lb

## 2021-06-09 DIAGNOSIS — E1169 Type 2 diabetes mellitus with other specified complication: Secondary | ICD-10-CM

## 2021-06-09 DIAGNOSIS — Z6841 Body Mass Index (BMI) 40.0 and over, adult: Secondary | ICD-10-CM | POA: Diagnosis not present

## 2021-06-09 DIAGNOSIS — E559 Vitamin D deficiency, unspecified: Secondary | ICD-10-CM

## 2021-06-09 MED ORDER — OZEMPIC (0.25 OR 0.5 MG/DOSE) 2 MG/1.5ML ~~LOC~~ SOPN: 0.5000 mg | PEN_INJECTOR | SUBCUTANEOUS | 0 refills | Status: DC

## 2021-06-09 MED ORDER — VITAMIN D (ERGOCALCIFEROL) 1.25 MG (50000 UNIT) PO CAPS: 50000.0000 [IU] | ORAL_CAPSULE | ORAL | 0 refills | Status: DC

## 2021-06-09 NOTE — Progress Notes (Signed)
Chief Complaint:   OBESITY [name] [name] is here to discuss his progress with his obesity treatment plan along with follow-up of his obesity related diagnoses. [name] [name] is on keeping a food journal and adhering to recommended goals of 1500-1800 calories and 100+ grams of protein daily and states he is following his eating plan approximately walking for% of the time. [name] [name] states he is walking for 30-45 minutes 7 times per week.  Today's visit was #: 56 Starting weight: 340 lbs Starting date: 12/28/2016 Today's weight: 303 lbs Today's date: 06/09/2021 Total lbs lost to date: 37 Total lbs lost since last in-office visit: 1  Interim History: [name] [name] is retaining some fluid today. He has been very active and he has some muscle aches. He is making some holiday eating strategies, as he plans to do some traveling over Thanksgiving.  Subjective:   1. Type 2 diabetes mellitus with other specified complication, without long-term current use of insulin ([facility]) [name] [name] is stable on Ozempic, and he denies nausea or vomiting. He is doing well on his diet prescription.  2. Vitamin D deficiency [name] [name] is on Vit D, and he denies nausea, vomiting, or muscle weakness.  Assessment/Plan:   1. Type 2 diabetes mellitus with other specified complication, without long-term current use of insulin ([facility]) We will refill Ozempic for 1 month. [name] [name] will continue with diet, exercise, and weight loss. Good blood sugar control is important to decrease the likelihood of diabetic complications such as nephropathy, neuropathy, limb loss, blindness, coronary artery disease, and death. Intensive lifestyle modification including diet, exercise and weight loss are the first line of treatment for diabetes.   - Semaglutide,0.25 or 0.5MG /DOS, (OZEMPIC, 0.25 OR 0.5 MG/DOSE,) 2 MG/1.5ML SOPN; Inject 0.5 mg into the skin every 7 (seven) days.  Dispense: 1.5 mL; Refill: 0  2. Vitamin D deficiency Low Vitamin D level contributes to  fatigue and are associated with obesity, breast, and colon cancer. We will refill prescription Vitamin D for 1 month. [name] [name] will follow-up for routine testing of Vitamin D, at least 2-3 times per year to avoid over-replacement.  - Vitamin D, Ergocalciferol, (DRISDOL) 1.25 MG (50000 UNIT) CAPS capsule; Take 1 capsule (50,000 Units total) by mouth every 14 (fourteen) days.  Dispense: 2 capsule; Refill: 0  3. Obesity with current BMI 40.1 [name] [name] is currently in the action stage of change. As such, his goal is to continue with weight loss efforts. He has agreed to keeping a food journal and adhering to recommended goals of 1500-1800 calories and 100+ grams of protein daily.   Exercise goals: As is.  Behavioral modification strategies: increasing lean protein intake, travel eating strategies, and holiday eating strategies .  [name] [name] has agreed to follow-up with our clinic in 4 weeks. He was informed of the importance of frequent follow-up visits to maximize his success with intensive lifestyle modifications for his multiple health conditions.   Objective:   Blood pressure 136/72, pulse 71, temperature 97.9 F (36.6 C), height 6\' 1"  (1.854 m), weight (!) 301 lb (136.5 kg), SpO2 98 %. Body mass index is 39.71 kg/m.  General: Cooperative, alert, well developed, in no acute distress. HEENT: Conjunctivae and lids unremarkable. Cardiovascular: Regular rhythm.  Lungs: Normal work of breathing. Neurologic: No focal deficits.   Lab Results  Component Value Date   CREATININE 1.20 02/22/2021   BUN 23 02/22/2021   NA 139 02/22/2021   K 4.6 02/22/2021   CL 103 02/22/2021   CO2 23 02/22/2021   Lab Results  Component Value Date   ALT 31 02/22/2021   AST 20 02/22/2021   ALKPHOS 63 02/22/2021   BILITOT 0.5 02/22/2021   Lab Results  Component Value Date   HGBA1C 6.3 (H) 02/22/2021   HGBA1C 6.6 (H) 09/16/2020   HGBA1C 6.1 11/28/2019   HGBA1C 6.0 (H) 08/14/2019   HGBA1C 6.0 (H) 03/25/2019    Lab Results  Component Value Date   INSULIN 11.7 02/22/2021   INSULIN 9.1 09/16/2020   INSULIN 10.0 08/14/2019   INSULIN 12.4 03/25/2019   INSULIN 19.2 09/24/2018   Lab Results  Component Value Date   TSH 1.500 03/25/2019   Lab Results  Component Value Date   CHOL 143 02/22/2021   HDL 39 (L) 02/22/2021   LDLCALC 85 02/22/2021   LDLDIRECT 148 (H) 11/15/2007   TRIG 100 02/22/2021   CHOLHDL 5.2 (H) 01/26/2016   Lab Results  Component Value Date   VD25OH 70.4 02/22/2021   VD25OH 52.1 09/16/2020   VD25OH 58.3 02/27/2020   Lab Results  Component Value Date   WBC 7.3 11/30/2017   HGB 15.0 11/30/2017   HCT 46.8 11/30/2017   MCV 86 11/30/2017   PLT 293 11/30/2017   No results found for: IRON, TIBC, FERRITIN  Obesity Behavioral Intervention:   Approximately 15 minutes were spent on the discussion below.  ASK: We discussed the diagnosis of obesity with [name] [name] today and [name] [name] agreed to give [name] permission to discuss obesity behavioral modification therapy today.  ASSESS: [name] [name] has the diagnosis of obesity and his BMI today is 40.1. [name] [name] is in the action stage of change.   ADVISE: [name] [name] was educated on the multiple health risks of obesity as well as the benefit of weight loss to improve his health. He was advised of the need for long term treatment and the importance of lifestyle modifications to improve his current health and to decrease his risk of future health problems.  AGREE: Multiple dietary modification options and treatment options were discussed and [name] [name] agreed to follow the recommendations documented in the above note.  ARRANGE: [name] [name] was educated on the importance of frequent visits to treat obesity as outlined per CMS and USPSTF guidelines and agreed to schedule his next follow up appointment today.  Attestation Statements:   Reviewed by clinician on day of visit: allergies, medications, problem list, medical history, surgical history, family  history, social history, and previous encounter notes.   I, Trixie Dredge, am acting as transcriptionist for [name], MD.  I have reviewed the above documentation for accuracy and completeness, and I agree with the above. -  [name], MD

## 2021-06-15 DIAGNOSIS — N3946 Mixed incontinence: Secondary | ICD-10-CM | POA: Diagnosis not present

## 2021-06-28 ENCOUNTER — Ambulatory Visit (INDEPENDENT_AMBULATORY_CARE_PROVIDER_SITE_OTHER): Payer: Medicare HMO | Admitting: Family Medicine

## 2021-06-30 ENCOUNTER — Encounter (INDEPENDENT_AMBULATORY_CARE_PROVIDER_SITE_OTHER): Payer: Self-pay | Admitting: Family Medicine

## 2021-06-30 ENCOUNTER — Ambulatory Visit (INDEPENDENT_AMBULATORY_CARE_PROVIDER_SITE_OTHER): Payer: Medicare HMO | Admitting: Family Medicine

## 2021-06-30 VITALS — BP 163/78 | HR 77 | Temp 98.2°F | Ht 73.0 in

## 2021-06-30 DIAGNOSIS — Z6841 Body Mass Index (BMI) 40.0 and over, adult: Secondary | ICD-10-CM | POA: Diagnosis not present

## 2021-06-30 DIAGNOSIS — E1169 Type 2 diabetes mellitus with other specified complication: Secondary | ICD-10-CM | POA: Diagnosis not present

## 2021-06-30 DIAGNOSIS — E559 Vitamin D deficiency, unspecified: Secondary | ICD-10-CM

## 2021-06-30 DIAGNOSIS — I10 Essential (primary) hypertension: Secondary | ICD-10-CM | POA: Diagnosis not present

## 2021-06-30 MED ORDER — SEMAGLUTIDE (1 MG/DOSE) 4 MG/3ML ~~LOC~~ SOPN: 1.0000 mg | PEN_INJECTOR | SUBCUTANEOUS | 0 refills | Status: DC

## 2021-06-30 MED ORDER — VITAMIN D (ERGOCALCIFEROL) 1.25 MG (50000 UNIT) PO CAPS: 50000.0000 [IU] | ORAL_CAPSULE | ORAL | 0 refills | Status: DC

## 2021-07-01 NOTE — Progress Notes (Signed)
Chief Complaint:   OBESITY [name] [name] is here to discuss his progress with his obesity treatment plan along with follow-up of his obesity related diagnoses. [name] [name] is on keeping a food journal and adhering to recommended goals of 1500-1800 calories and 100+ grams of protein daily and states he is following his eating plan approximately 90% of the time. [name] [name] states he is walking for 15-30 minutes 7 times per week.  Today's visit was #: 75 Starting weight: 340 lbs Starting date: 12/28/2016 Today's weight: 309 lbs Today's date: 06/30/2021 Total lbs lost to date: 31 Total lbs lost since last in-office visit: 0  Interim History: [name] [name] is retaining fluid today. He has been traveling over Thanksgiving and forgot some of his medications. He also injured his hip and knee, and this could also increase fluid due to swelling.  Subjective:   1. Essential hypertension [name] [name]'s blood pressure is elevated today. He had forgotten to take his medications while traveling.  2. Vitamin D deficiency [name] [name] is stable on Vit D, and he denies nausea, vomiting, or muscle weakness.  3. Type 2 diabetes mellitus with other specified complication, without long-term current use of insulin ([name] [name]) Clerance is stable on Ozempic, but he is not getting the full 4 doses from each pen. He is decreasing his dose to make it last.  Assessment/Plan:   1. Essential hypertension [name] [name] is to take his medications today, and we will recheck his blood pressure in 2 weeks.   2. Vitamin D deficiency Low Vitamin D level contributes to fatigue and are associated with obesity, breast, and colon cancer. We will refill prescription Vitamin D for 1 month. [name] [name] will follow-up for routine testing of Vitamin D, at least 2-3 times per year to avoid over-replacement.  - Vitamin D, Ergocalciferol, (DRISDOL) 1.25 MG (50000 UNIT) CAPS capsule; Take 1 capsule (50,000 Units total) by mouth every 14 (fourteen) days.  Dispense: 2 capsule;  Refill: 0  3. Type 2 diabetes mellitus with other specified complication, without long-term current use of insulin ([place]) [name] [name] agreed to increase Ozempic to 1 mg (he is to start at 0.5 mg, approximately 39 clicks, and follow). Good blood sugar control is important to decrease the likelihood of diabetic complications such as nephropathy, neuropathy, limb loss, blindness, coronary artery disease, and death. Intensive lifestyle modification including diet, exercise and weight loss are the first line of treatment for diabetes.   - Semaglutide, 1 MG/DOSE, 4 MG/3ML SOPN; Inject 1 mg as directed once a week.  Dispense: 3 mL; Refill: 0  4. Obesity with current BMI 40.8 [name] [name] is currently in the action stage of change. As such, his goal is to continue with weight loss efforts. He has agreed to keeping a food journal and adhering to recommended goals of 1500-1800 calories and 100+ grams of protein daily.   Exercise goals: As is.  Behavioral modification strategies: increasing lean protein intake, increasing water intake, and decreasing sodium intake.  [name] [name] has agreed to follow-up with our clinic in 2 weeks. He was informed of the importance of frequent follow-up visits to maximize his success with intensive lifestyle modifications for his multiple health conditions.   Objective:   Blood pressure (!) 163/78, pulse 77, temperature 98.2 F (36.8 C), height 6\' 1"  (1.854 m), SpO2 100 %. Body mass index is 39.71 kg/m.  General: Cooperative, alert, well developed, in no acute distress. HEENT: Conjunctivae and lids unremarkable. Cardiovascular: Regular rhythm.  Lungs: Normal work of breathing. Neurologic: No focal deficits.   Lab  Results  Component Value Date   CREATININE 1.20 02/22/2021   BUN 23 02/22/2021   NA 139 02/22/2021   K 4.6 02/22/2021   CL 103 02/22/2021   CO2 23 02/22/2021   Lab Results  Component Value Date   ALT 31 02/22/2021   AST 20 02/22/2021   ALKPHOS 63 02/22/2021    BILITOT 0.5 02/22/2021   Lab Results  Component Value Date   HGBA1C 6.3 (H) 02/22/2021   HGBA1C 6.6 (H) 09/16/2020   HGBA1C 6.1 11/28/2019   HGBA1C 6.0 (H) 08/14/2019   HGBA1C 6.0 (H) 03/25/2019   Lab Results  Component Value Date   INSULIN 11.7 02/22/2021   INSULIN 9.1 09/16/2020   INSULIN 10.0 08/14/2019   INSULIN 12.4 03/25/2019   INSULIN 19.2 09/24/2018   Lab Results  Component Value Date   TSH 1.500 03/25/2019   Lab Results  Component Value Date   CHOL 143 02/22/2021   HDL 39 (L) 02/22/2021   LDLCALC 85 02/22/2021   LDLDIRECT 148 (H) 11/15/2007   TRIG 100 02/22/2021   CHOLHDL 5.2 (H) 01/26/2016   Lab Results  Component Value Date   VD25OH 70.4 02/22/2021   VD25OH 52.1 09/16/2020   VD25OH 58.3 02/27/2020   Lab Results  Component Value Date   WBC 7.3 11/30/2017   HGB 15.0 11/30/2017   HCT 46.8 11/30/2017   MCV 86 11/30/2017   PLT 293 11/30/2017   No results found for: IRON, TIBC, FERRITIN  Obesity Behavioral Intervention:   Approximately 15 minutes were spent on the discussion below.  ASK: We discussed the diagnosis of obesity with [name] [name] today and [name] [name] agreed to give [name] permission to discuss obesity behavioral modification therapy today.  ASSESS: [name] has the diagnosis of obesity and his BMI today is 40.8. [name] [name] is in the action stage of change.   ADVISE: [name] [name] was educated on the multiple health risks of obesity as well as the benefit of weight loss to improve his health. He was advised of the need for long term treatment and the importance of lifestyle modifications to improve his current health and to decrease his risk of future health problems.  AGREE: Multiple dietary modification options and treatment options were discussed and [name] [name] agreed to follow the recommendations documented in the above note.  ARRANGE: [name] [name] was educated on the importance of frequent visits to treat obesity as outlined per CMS and USPSTF guidelines and agreed to  schedule his next follow up appointment today.  Attestation Statements:   Reviewed by clinician on day of visit: allergies, medications, problem list, medical history, surgical history, family history, social history, and previous encounter notes.   I, Trixie Dredge, am acting as transcriptionist for [name], MD.  I have reviewed the above documentation for accuracy and completeness, and I agree with the above. -  [name], MD

## 2021-07-08 ENCOUNTER — Other Ambulatory Visit (INDEPENDENT_AMBULATORY_CARE_PROVIDER_SITE_OTHER): Payer: Self-pay | Admitting: Family Medicine

## 2021-07-08 DIAGNOSIS — E1169 Type 2 diabetes mellitus with other specified complication: Secondary | ICD-10-CM

## 2021-07-08 DIAGNOSIS — N3941 Urge incontinence: Secondary | ICD-10-CM

## 2021-07-08 DIAGNOSIS — I10 Essential (primary) hypertension: Secondary | ICD-10-CM

## 2021-07-08 DIAGNOSIS — E559 Vitamin D deficiency, unspecified: Secondary | ICD-10-CM

## 2021-07-08 DIAGNOSIS — F3289 Other specified depressive episodes: Secondary | ICD-10-CM

## 2021-07-09 ENCOUNTER — Other Ambulatory Visit: Payer: Self-pay

## 2021-07-09 DIAGNOSIS — F3289 Other specified depressive episodes: Secondary | ICD-10-CM

## 2021-07-09 DIAGNOSIS — E1169 Type 2 diabetes mellitus with other specified complication: Secondary | ICD-10-CM

## 2021-07-09 DIAGNOSIS — I10 Essential (primary) hypertension: Secondary | ICD-10-CM

## 2021-07-09 DIAGNOSIS — N3941 Urge incontinence: Secondary | ICD-10-CM

## 2021-07-09 DIAGNOSIS — E559 Vitamin D deficiency, unspecified: Secondary | ICD-10-CM

## 2021-07-09 NOTE — Telephone Encounter (Signed)
Patient calls nurse line regarding rx refills. Patient states that Dr. Migdalia Dk office refilled last time as an urgent refill, however, would not be continuing to send refills.   Please advise if medication refills can be sent to [facility].   Talbot Grumbling, RN

## 2021-07-10 MED ORDER — METOPROLOL SUCCINATE ER 25 MG PO TB24
12.5000 mg | ORAL_TABLET | Freq: Every day | ORAL | 0 refills | Status: DC
Start: 2021-07-10 — End: 2021-07-30

## 2021-07-10 MED ORDER — ACCU-CHEK AVIVA PLUS VI STRP: 1.0000 | ORAL_STRIP | Freq: Two times a day (BID) | 0 refills | Status: DC

## 2021-07-10 MED ORDER — VITAMIN D (ERGOCALCIFEROL) 1.25 MG (50000 UNIT) PO CAPS: 50000.0000 [IU] | ORAL_CAPSULE | ORAL | 0 refills | Status: DC

## 2021-07-10 MED ORDER — BUPROPION HCL ER (SR) 200 MG PO TB12: 200.0000 mg | ORAL_TABLET | Freq: Every morning | ORAL | 0 refills | Status: DC

## 2021-07-10 MED ORDER — SOLIFENACIN SUCCINATE 5 MG PO TABS: 5.0000 mg | ORAL_TABLET | Freq: Every day | ORAL | 0 refills | Status: DC

## 2021-07-10 MED ORDER — METFORMIN HCL 1000 MG PO TABS: 1000.0000 mg | ORAL_TABLET | Freq: Every day | ORAL | 0 refills | Status: DC

## 2021-07-10 MED ORDER — LOSARTAN POTASSIUM 50 MG PO TABS
ORAL_TABLET | ORAL | 0 refills | Status: DC
Start: 2021-07-10 — End: 2021-10-06

## 2021-07-10 MED ORDER — FUROSEMIDE 40 MG PO TABS: 40.0000 mg | ORAL_TABLET | Freq: Every day | ORAL | 0 refills | Status: DC

## 2021-07-12 NOTE — Telephone Encounter (Signed)
Pt last seen by Dr. Beasley.  

## 2021-07-14 ENCOUNTER — Telehealth (INDEPENDENT_AMBULATORY_CARE_PROVIDER_SITE_OTHER): Payer: Medicare HMO | Admitting: Family Medicine

## 2021-07-14 ENCOUNTER — Encounter (INDEPENDENT_AMBULATORY_CARE_PROVIDER_SITE_OTHER): Payer: Self-pay | Admitting: Family Medicine

## 2021-07-14 DIAGNOSIS — J069 Acute upper respiratory infection, unspecified: Secondary | ICD-10-CM | POA: Diagnosis not present

## 2021-07-14 DIAGNOSIS — Z6841 Body Mass Index (BMI) 40.0 and over, adult: Secondary | ICD-10-CM | POA: Diagnosis not present

## 2021-07-15 NOTE — Progress Notes (Signed)
TeleHealth Visit:  Due to the COVID-19 pandemic, this visit was completed with telemedicine (audio/video) technology to reduce patient and provider exposure as well as to preserve personal protective equipment.   [name] [name] has verbally consented to this TeleHealth visit. The patient is located at home, the provider is located at the [facility] office. The participants in this visit include the listed provider and patient. The visit was conducted today via MyChart video.   Chief Complaint: OBESITY [name] [name] is here to discuss his progress with his obesity treatment plan along with follow-up of his obesity related diagnoses. [name] [name] is on keeping a food journal and adhering to recommended goals of 1500-1800 calories and 100+ grams of protein daily and states he is following his eating plan approximately 90% of the time. [name] [name] states he was walking some.  Today's visit was #: 68 Starting weight: 340 lbs Starting date: 12/28/2016  Interim History: [name] [name] changed his visit to MyChart due to a viral upper respiratory infection. He has done well with working on portion control and smarter choices, but his appetite is decreased.  Subjective:   1. Viral URI [name] [name] started developing pharyngitis, headache, and congestion yesterday, and he has significant fatigue and some body aches today. He has had his COVID vaccines and boosters, and his flu vaccine this Fall. He denies shortness of breath, but notes mild cough.  Assessment/Plan:   1. Viral URI [name] [name] has a home test for COVID, and he is to test and contact his primary care physician in the meanwhile. He can use Mucinex or Tylenol to help with his symptoms in the meanwhile.  2. Obesity with current BMI of 39.71 [name] [name] is currently in the action stage of change. As such, his goal is to continue with weight loss efforts. He has agreed to keeping a food journal and adhering to recommended goals of 1500-1800 calories and 100+ grams of  protein daily.   [name] [name]'s goal is to work on hydration while he is recovering, and go back to journaling when he feels better.  Behavioral modification strategies: increasing water intake.  [name] [name] has agreed to follow-up with our clinic in 2 to 3 weeks. He was informed of the importance of frequent follow-up visits to maximize his success with intensive lifestyle modifications for his multiple health conditions.  Objective:   VITALS: Per patient if applicable, see vitals. GENERAL: Alert and in no acute distress. CARDIOPULMONARY: No increased WOB. Speaking in clear sentences.  PSYCH: Pleasant and cooperative. Speech normal rate and rhythm. Affect is appropriate. Insight and judgement are appropriate. Attention is focused, linear, and appropriate.  NEURO: Oriented as arrived to appointment on time with no prompting.   Lab Results  Component Value Date   CREATININE 1.20 02/22/2021   BUN 23 02/22/2021   NA 139 02/22/2021   K 4.6 02/22/2021   CL 103 02/22/2021   CO2 23 02/22/2021   Lab Results  Component Value Date   ALT 31 02/22/2021   AST 20 02/22/2021   ALKPHOS 63 02/22/2021   BILITOT 0.5 02/22/2021   Lab Results  Component Value Date   HGBA1C 6.3 (H) 02/22/2021   HGBA1C 6.6 (H) 09/16/2020   HGBA1C 6.1 11/28/2019   HGBA1C 6.0 (H) 08/14/2019   HGBA1C 6.0 (H) 03/25/2019   Lab Results  Component Value Date   INSULIN 11.7 02/22/2021   INSULIN 9.1 09/16/2020   INSULIN 10.0 08/14/2019   INSULIN 12.4 03/25/2019   INSULIN 19.2 09/24/2018   Lab Results  Component  Value Date   TSH 1.500 03/25/2019   Lab Results  Component Value Date   CHOL 143 02/22/2021   HDL 39 (L) 02/22/2021   LDLCALC 85 02/22/2021   LDLDIRECT 148 (H) 11/15/2007   TRIG 100 02/22/2021   CHOLHDL 5.2 (H) 01/26/2016   Lab Results  Component Value Date   VD25OH 70.4 02/22/2021   VD25OH 52.1 09/16/2020   VD25OH 58.3 02/27/2020   Lab Results  Component Value Date   WBC 7.3 11/30/2017   HGB 15.0  11/30/2017   HCT 46.8 11/30/2017   MCV 86 11/30/2017   PLT 293 11/30/2017   No results found for: IRON, TIBC, FERRITIN  Attestation Statements:   Reviewed by clinician on day of visit: allergies, medications, problem list, medical history, surgical history, family history, social history, and previous encounter notes.   I, Trixie Dredge, am acting as transcriptionist for [name], MD.  I have reviewed the above documentation for accuracy and completeness, and I agree with the above. - [name], MD

## 2021-07-16 ENCOUNTER — Encounter: Payer: Self-pay | Admitting: Emergency Medicine

## 2021-07-16 ENCOUNTER — Other Ambulatory Visit: Payer: Self-pay

## 2021-07-16 ENCOUNTER — Ambulatory Visit
Admission: EM | Admit: 2021-07-16 | Discharge: 2021-07-16 | Disposition: A | Discharge status: Home / Self Care | Payer: Medicare HMO | Attending: Family Medicine | Admitting: Family Medicine

## 2021-07-16 DIAGNOSIS — J069 Acute upper respiratory infection, unspecified: Secondary | ICD-10-CM | POA: Diagnosis not present

## 2021-07-16 DIAGNOSIS — Z20822 Contact with and (suspected) exposure to covid-19: Secondary | ICD-10-CM

## 2021-07-16 MED ORDER — PROMETHAZINE-DM 6.25-15 MG/5ML PO SYRP: 5.0000 mL | ORAL_SOLUTION | Freq: Four times a day (QID) | ORAL | 0 refills | Status: DC | PRN

## 2021-07-16 MED ORDER — FLUTICASONE PROPIONATE 50 MCG/ACT NA SUSP: 1.0000 | Freq: Two times a day (BID) | NASAL | 0 refills | Status: DC

## 2021-07-16 MED ORDER — ALBUTEROL SULFATE HFA 108 (90 BASE) MCG/ACT IN AERS: 2.0000 | INHALATION_SPRAY | Freq: Four times a day (QID) | RESPIRATORY_TRACT | 0 refills | Status: DC | PRN

## 2021-07-16 NOTE — ED Triage Notes (Signed)
Nasal congestion, headache, x 2 days.  Dry cough today.  States teeth hurt
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
## 2021-07-17 LAB — COVID-19, FLU A+B NAA
Influenza A, NAA: NOT DETECTED
Influenza B, NAA: NOT DETECTED
SARS-CoV-2, NAA: NOT DETECTED

## 2021-07-26 ENCOUNTER — Other Ambulatory Visit: Payer: Self-pay

## 2021-07-26 ENCOUNTER — Ambulatory Visit
Admission: EM | Admit: 2021-07-26 | Discharge: 2021-07-26 | Disposition: A | Discharge status: Home / Self Care | Payer: Medicare HMO | Attending: Family Medicine | Admitting: Family Medicine

## 2021-07-26 DIAGNOSIS — J22 Unspecified acute lower respiratory infection: Secondary | ICD-10-CM

## 2021-07-26 MED ORDER — DOXYCYCLINE HYCLATE 100 MG PO CAPS: 100.0000 mg | ORAL_CAPSULE | Freq: Two times a day (BID) | ORAL | 0 refills | Status: DC

## 2021-07-26 MED ORDER — PROMETHAZINE-DM 6.25-15 MG/5ML PO SYRP: 5.0000 mL | ORAL_SOLUTION | Freq: Four times a day (QID) | ORAL | 0 refills | Status: DC | PRN
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
## 2021-07-26 NOTE — ED Triage Notes (Signed)
Patient states he was here 8 to 9 days ago with the same symptoms.   Patient states he still has nasal congestion, cough and chest tightness.   Patient states he has been using the nasal spray and cough syrup but nothing is better.   Denies Fever

## 2021-07-29 ENCOUNTER — Other Ambulatory Visit: Payer: Self-pay

## 2021-07-29 DIAGNOSIS — I10 Essential (primary) hypertension: Secondary | ICD-10-CM

## 2021-07-29 DIAGNOSIS — F3289 Other specified depressive episodes: Secondary | ICD-10-CM

## 2021-07-29 DIAGNOSIS — E7849 Other hyperlipidemia: Secondary | ICD-10-CM

## 2021-07-30 MED ORDER — ROSUVASTATIN CALCIUM 40 MG PO TABS: ORAL_TABLET | ORAL | 0 refills | Status: DC

## 2021-07-30 MED ORDER — METOPROLOL SUCCINATE ER 25 MG PO TB24
12.5000 mg | ORAL_TABLET | Freq: Every day | ORAL | 0 refills | Status: DC
Start: 2021-07-30 — End: 2022-02-04

## 2021-07-30 MED ORDER — ESCITALOPRAM OXALATE 20 MG PO TABS: 20.0000 mg | ORAL_TABLET | Freq: Every day | ORAL | 0 refills | Status: DC

## 2021-08-01 DIAGNOSIS — N200 Calculus of kidney: Secondary | ICD-10-CM

## 2021-08-01 HISTORY — DX: Calculus of kidney: N20.0

## 2021-08-03 ENCOUNTER — Ambulatory Visit (INDEPENDENT_AMBULATORY_CARE_PROVIDER_SITE_OTHER): Payer: Medicare HMO | Admitting: Family Medicine

## 2021-08-03 ENCOUNTER — Other Ambulatory Visit: Payer: Self-pay

## 2021-08-03 ENCOUNTER — Encounter (INDEPENDENT_AMBULATORY_CARE_PROVIDER_SITE_OTHER): Payer: Self-pay | Admitting: Family Medicine

## 2021-08-03 VITALS — BP 134/81 | HR 65 | Temp 98.3°F | Ht 73.0 in | Wt 311.0 lb

## 2021-08-03 DIAGNOSIS — Z6841 Body Mass Index (BMI) 40.0 and over, adult: Secondary | ICD-10-CM | POA: Diagnosis not present

## 2021-08-03 DIAGNOSIS — E559 Vitamin D deficiency, unspecified: Secondary | ICD-10-CM

## 2021-08-03 DIAGNOSIS — R5383 Other fatigue: Secondary | ICD-10-CM | POA: Diagnosis not present

## 2021-08-03 MED ORDER — VITAMIN D (ERGOCALCIFEROL) 1.25 MG (50000 UNIT) PO CAPS: 50000.0000 [IU] | ORAL_CAPSULE | ORAL | 0 refills | Status: DC

## 2021-08-04 NOTE — Progress Notes (Signed)
Chief Complaint:   OBESITY [name] [name] is here to discuss his progress with his obesity treatment plan along with follow-up of his obesity related diagnoses. [name] [name] is on keeping a food journal and adhering to recommended goals of 1500-1800 calories and 100+ grams of protein daily and states he is following his eating plan approximately 75% of the time. [name] [name] states he is doing 0 minutes 0 times per week.  Today's visit was #: 50 Starting weight: 340 lbs Starting date: 12/28/2016 Today's weight: 311 lbs Today's date: 08/03/2021 Total lbs lost to date: 29 Total lbs lost since last in-office visit: 0  Interim History: [name] [name] has been sick with pneumonia and he was not able to concentrate on weight loss. He is feeling much better and he is ready to get back to his diet.  Subjective:   1. Vitamin D deficiency [name] [name] is on Vit D, and he requests a refill today. No side effects were noted.  2. Other fatigue [name] [name] is recovering from pneumonia and although he feels much better, his energy is still low. He has done any recent exercise of course.   Assessment/Plan:   1. Vitamin D deficiency Low Vitamin D level contributes to fatigue and are associated with obesity, breast, and colon cancer. We will refill prescription Vitamin D for 1 month. [name] [name] will follow-up for routine testing of Vitamin D, at least 2-3 times per year to avoid over-replacement.  - Vitamin D, Ergocalciferol, (DRISDOL) 1.25 MG (50000 UNIT) CAPS capsule; Take 1 capsule (50,000 Units total) by mouth every 14 (fourteen) days.  Dispense: 2 capsule; Refill: 0  2. Other fatigue [name] [name] is to concentrate on rest and proper nutrition, and we will reevaluate when he is ready to start back to exercise.   3. Obesity BMI today is 21 [name] [name] is currently in the action stage of change. As such, his goal is to continue with weight loss efforts. He has agreed to change to following a lower carbohydrate, vegetable and lean protein rich  diet plan.   Behavioral modification strategies: increasing lean protein intake, increasing water intake, and decreasing sodium intake.  [name] [name] has agreed to follow-up with our clinic in 2 weeks. He was informed of the importance of frequent follow-up visits to maximize his success with intensive lifestyle modifications for his multiple health conditions.   Objective:   Blood pressure 134/81, pulse 65, temperature 98.3 F (36.8 C), temperature source Oral, height 6\' 1"  (1.854 m), weight (!) 311 lb (141.1 kg), SpO2 97 %. Body mass index is 41.03 kg/m.  General: Cooperative, alert, well developed, in no acute distress. HEENT: Conjunctivae and lids unremarkable. Cardiovascular: Regular rhythm.  Lungs: Normal work of breathing. Neurologic: No focal deficits.   Lab Results  Component Value Date   CREATININE 1.20 02/22/2021   BUN 23 02/22/2021   NA 139 02/22/2021   K 4.6 02/22/2021   CL 103 02/22/2021   CO2 23 02/22/2021   Lab Results  Component Value Date   ALT 31 02/22/2021   AST 20 02/22/2021   ALKPHOS 63 02/22/2021   BILITOT 0.5 02/22/2021   Lab Results  Component Value Date   HGBA1C 6.3 (H) 02/22/2021   HGBA1C 6.6 (H) 09/16/2020   HGBA1C 6.1 11/28/2019   HGBA1C 6.0 (H) 08/14/2019   HGBA1C 6.0 (H) 03/25/2019   Lab Results  Component Value Date   INSULIN 11.7 02/22/2021   INSULIN 9.1 09/16/2020   INSULIN 10.0 08/14/2019   INSULIN 12.4 03/25/2019   INSULIN 19.2 09/24/2018  Lab Results  Component Value Date   TSH 1.500 03/25/2019   Lab Results  Component Value Date   CHOL 143 02/22/2021   HDL 39 (L) 02/22/2021   LDLCALC 85 02/22/2021   LDLDIRECT 148 (H) 11/15/2007   TRIG 100 02/22/2021   CHOLHDL 5.2 (H) 01/26/2016   Lab Results  Component Value Date   VD25OH 70.4 02/22/2021   VD25OH 52.1 09/16/2020   VD25OH 58.3 02/27/2020   Lab Results  Component Value Date   WBC 7.3 11/30/2017   HGB 15.0 11/30/2017   HCT 46.8 11/30/2017   MCV 86 11/30/2017    PLT 293 11/30/2017   No results found for: IRON, TIBC, FERRITIN  Attestation Statements:   Reviewed by clinician on day of visit: allergies, medications, problem list, medical history, surgical history, family history, social history, and previous encounter notes.  Time spent on visit including pre-visit chart review and post-visit care and charting was 30 minutes.    I, Trixie Dredge, am acting as transcriptionist for [name], MD.  I have reviewed the above documentation for accuracy and completeness, and I agree with the above. -  [name], MD

## 2021-08-16 ENCOUNTER — Encounter (INDEPENDENT_AMBULATORY_CARE_PROVIDER_SITE_OTHER): Payer: Self-pay

## 2021-08-18 ENCOUNTER — Ambulatory Visit (INDEPENDENT_AMBULATORY_CARE_PROVIDER_SITE_OTHER): Payer: Medicare PPO | Admitting: Family Medicine

## 2021-08-18 ENCOUNTER — Other Ambulatory Visit: Payer: Self-pay

## 2021-08-18 ENCOUNTER — Encounter (INDEPENDENT_AMBULATORY_CARE_PROVIDER_SITE_OTHER): Payer: Self-pay | Admitting: Family Medicine

## 2021-08-18 VITALS — BP 136/69 | HR 66 | Temp 98.0°F | Ht 73.0 in | Wt 311.0 lb

## 2021-08-18 DIAGNOSIS — E1169 Type 2 diabetes mellitus with other specified complication: Secondary | ICD-10-CM | POA: Diagnosis not present

## 2021-08-18 DIAGNOSIS — E559 Vitamin D deficiency, unspecified: Secondary | ICD-10-CM | POA: Diagnosis not present

## 2021-08-18 DIAGNOSIS — N3946 Mixed incontinence: Secondary | ICD-10-CM | POA: Diagnosis not present

## 2021-08-18 DIAGNOSIS — N5201 Erectile dysfunction due to arterial insufficiency: Secondary | ICD-10-CM | POA: Diagnosis not present

## 2021-08-18 DIAGNOSIS — Z6841 Body Mass Index (BMI) 40.0 and over, adult: Secondary | ICD-10-CM | POA: Diagnosis not present

## 2021-08-18 MED ORDER — VITAMIN D (ERGOCALCIFEROL) 1.25 MG (50000 UNIT) PO CAPS: 50000.0000 [IU] | ORAL_CAPSULE | ORAL | 0 refills | Status: DC

## 2021-08-18 MED ORDER — TIRZEPATIDE 12.5 MG/0.5ML ~~LOC~~ SOAJ: 12.5000 mg | SUBCUTANEOUS | 0 refills | Status: DC

## 2021-08-18 NOTE — Progress Notes (Signed)
Chief Complaint:   OBESITY Lorraine is here to discuss his progress with his obesity treatment plan along with follow-up of his obesity related diagnoses. Delvin is on following a lower carbohydrate, vegetable and lean protein rich diet plan and states he is following his eating plan approximately 95% of the time. Patrick states he is walking 4-5 times per week.  Today's visit was #: 60 Starting weight: 340 lbs Starting date: 12/28/2016 Today's weight: 311 lbs Today's date: 08/18/2021 Total lbs lost to date: 29 Total lbs lost since last in-office visit: 0  Interim History: Tanish has done well with maintaining his weight since his last visit. He sometimes struggles to eat all of his protein, and his RMR may be starting to all. He is considering starting to add some strengthening exercises at home.  Subjective:   1. Vitamin D deficiency Andrick is stable on Vit D, and he is doing well with remembering to take a weekly dose.  2. Type 2 diabetes mellitus with other specified complication, without long-term current use of insulin ([place]) Mayfield is on Ozempic 2 mg PM, but he only takes a 1/2 dose (approximately 1 mg).  Assessment/Plan:   1. Vitamin D deficiency Low Vitamin D level contributes to fatigue and are associated with obesity, breast, and colon cancer. We will refill prescription Vitamin D for 1 month. Zabdiel will follow-up for routine testing of Vitamin D, at least 2-3 times per year to avoid over-replacement.  - Vitamin D, Ergocalciferol, (DRISDOL) 1.25 MG (50000 UNIT) CAPS capsule; Take 1 capsule (50,000 Units total) by mouth every 14 (fourteen) days.  Dispense: 2 capsule; Refill: 0  2. Type 2 diabetes mellitus with other specified complication, without long-term current use of insulin ([place]) Gaddiel is to increase his dose of Ozempic by 5 clicks for now until we get approved for [facility]. He agreed to start Mounjaro 12.5 mg weekly with no refills (prior authorization needed).  Good blood sugar control is important to decrease the likelihood of diabetic complications such as nephropathy, neuropathy, limb loss, blindness, coronary artery disease, and death. Intensive lifestyle modification including diet, exercise and weight loss are the first line of treatment for diabetes.   - tirzepatide (MOUNJARO) 12.5 MG/0.5ML Pen; Inject 12.5 mg into the skin once a week.  Dispense: 6 mL; Refill: 0  3. Obesity with current BMI of 41.0 Argelio is currently in the action stage of change. As such, his goal is to continue with weight loss efforts. He has agreed to change to the Category 2 Plan.   Exercise goals: As is.  Behavioral modification strategies: increasing lean protein intake and no skipping meals.  Plez has agreed to follow-up with our clinic in 2 to 3 weeks. He was informed of the importance of frequent follow-up visits to maximize his success with intensive lifestyle modifications for his multiple health conditions.   Objective:   Blood pressure 136/69, pulse 66, temperature 98 F (36.7 C), temperature source Oral, height 6\' 1"  (1.854 m), weight (!) 311 lb (141.1 kg), SpO2 95 %. Body mass index is 41.03 kg/m.  General: Cooperative, alert, well developed, in no acute distress. HEENT: Conjunctivae and lids unremarkable. Cardiovascular: Regular rhythm.  Lungs: Normal work of breathing. Neurologic: No focal deficits.   Lab Results  Component Value Date   CREATININE 1.20 02/22/2021   BUN 23 02/22/2021   NA 139 02/22/2021   K 4.6 02/22/2021   CL 103 02/22/2021   CO2 23 02/22/2021   Lab Results  Component Value Date   ALT 31 02/22/2021   AST 20 02/22/2021   ALKPHOS 63 02/22/2021   BILITOT 0.5 02/22/2021   Lab Results  Component Value Date   HGBA1C 6.3 (H) 02/22/2021   HGBA1C 6.6 (H) 09/16/2020   HGBA1C 6.1 11/28/2019   HGBA1C 6.0 (H) 08/14/2019   HGBA1C 6.0 (H) 03/25/2019   Lab Results  Component Value Date   INSULIN 11.7 02/22/2021   INSULIN 9.1  09/16/2020   INSULIN 10.0 08/14/2019   INSULIN 12.4 03/25/2019   INSULIN 19.2 09/24/2018   Lab Results  Component Value Date   TSH 1.500 03/25/2019   Lab Results  Component Value Date   CHOL 143 02/22/2021   HDL 39 (L) 02/22/2021   LDLCALC 85 02/22/2021   LDLDIRECT 148 (H) 11/15/2007   TRIG 100 02/22/2021   CHOLHDL 5.2 (H) 01/26/2016   Lab Results  Component Value Date   VD25OH 70.4 02/22/2021   VD25OH 52.1 09/16/2020   VD25OH 58.3 02/27/2020   Lab Results  Component Value Date   WBC 7.3 11/30/2017   HGB 15.0 11/30/2017   HCT 46.8 11/30/2017   MCV 86 11/30/2017   PLT 293 11/30/2017   No results found for: IRON, TIBC, FERRITIN  Obesity Behavioral Intervention:   Approximately 15 minutes were spent on the discussion below.  ASK: We discussed the diagnosis of obesity with [name] today and Rance agreed to give [name] permission to discuss obesity behavioral modification therapy today.  ASSESS: Ferdinando has the diagnosis of obesity and his BMI today is 41.0. Prabhav is in the action stage of change.   ADVISE: Nahuel was educated on the multiple health risks of obesity as well as the benefit of weight loss to improve his health. He was advised of the need for long term treatment and the importance of lifestyle modifications to improve his current health and to decrease his risk of future health problems.  AGREE: Multiple dietary modification options and treatment options were discussed and Deloss agreed to follow the recommendations documented in the above note.  ARRANGE: Isak was educated on the importance of frequent visits to treat obesity as outlined per CMS and USPSTF guidelines and agreed to schedule his next follow up appointment today.  Attestation Statements:   Reviewed by clinician on day of visit: allergies, medications, problem list, medical history, surgical history, family history, social history, and previous encounter notes.   I, Trixie Dredge, am  acting as transcriptionist for [name], MD.  I have reviewed the above documentation for accuracy and completeness, and I agree with the above. -  [name], MD

## 2021-09-01 ENCOUNTER — Encounter (INDEPENDENT_AMBULATORY_CARE_PROVIDER_SITE_OTHER): Payer: Self-pay | Admitting: Family Medicine

## 2021-09-01 ENCOUNTER — Other Ambulatory Visit: Payer: Self-pay

## 2021-09-01 ENCOUNTER — Ambulatory Visit (INDEPENDENT_AMBULATORY_CARE_PROVIDER_SITE_OTHER): Payer: Medicare PPO | Admitting: Family Medicine

## 2021-09-01 VITALS — BP 135/79 | HR 69 | Temp 97.8°F | Ht 73.0 in | Wt 304.0 lb

## 2021-09-01 DIAGNOSIS — Z6841 Body Mass Index (BMI) 40.0 and over, adult: Secondary | ICD-10-CM | POA: Diagnosis not present

## 2021-09-01 DIAGNOSIS — E1169 Type 2 diabetes mellitus with other specified complication: Secondary | ICD-10-CM

## 2021-09-01 DIAGNOSIS — E559 Vitamin D deficiency, unspecified: Secondary | ICD-10-CM | POA: Diagnosis not present

## 2021-09-01 DIAGNOSIS — E66813 Obesity, class 3: Secondary | ICD-10-CM

## 2021-09-01 MED ORDER — VITAMIN D (ERGOCALCIFEROL) 1.25 MG (50000 UNIT) PO CAPS: 50000.0000 [IU] | ORAL_CAPSULE | ORAL | 0 refills | Status: DC

## 2021-09-01 NOTE — Progress Notes (Signed)
Chief Complaint:   OBESITY [name] [name] is here to discuss his progress with his obesity treatment plan along with follow-up of his obesity related diagnoses. [name] [name] is on the Category 4 Plan and states he is following his eating plan approximately 100% of the time. [name] [name] states he is doing 0 minutes 0 times per week.  Today's visit was #: 36 Starting weight: 340 lbs Starting date: 12/28/2016 Today's weight: 304 lbs Today's date: 09/01/2021 Total lbs lost to date: 36 Total lbs lost since last in-office visit: 7  Interim History: [name] [name] has done very well with weight loss on his Category 4 plan. He may not be eating enough calories however. His hunger is appropriate and not excessive.  Subjective:   1. Type 2 diabetes mellitus with other specified complication, without long-term current use of insulin (HCC) [name] [name] is on Ozempic and he tried to change to [place], but his insurance denied it. He  has had some morning hyperglycemia up to 160's, this appears to be due to rebound.   2. Vitamin D deficiency [name] [name] is stable on Vit D with no side effects. He requests a refill.  Assessment/Plan:   1. Type 2 diabetes mellitus with other specified complication, without long-term current use of insulin ([place]) [name] [name] will continue Ozempic, and will try for a prior authorization for [facility]. Good blood sugar control is important to decrease the likelihood of diabetic complications such as nephropathy, neuropathy, limb loss, blindness, coronary artery disease, and death. Intensive lifestyle modification including diet, exercise and weight loss are the first line of treatment for diabetes.   2. Vitamin D deficiency We will refill prescription Vitamin D for 1 month. [name] [name] will follow-up for routine testing of Vitamin D, at least 2-3 times per year to avoid over-replacement.  - Vitamin D, Ergocalciferol, (DRISDOL) 1.25 MG (50000 UNIT) CAPS capsule; Take 1 capsule (50,000 Units total) by mouth every 14  (fourteen) days.  Dispense: 2 capsule; Refill: 0  3. Obesity with current BMI of 40.2 [name] [name] is currently in the action stage of change. As such, his goal is to continue with weight loss efforts. He has agreed to the Category 4 Plan.   Behavioral modification strategies: increasing lean protein intake and no skipping meals.  [name] has agreed to follow-up with our clinic in 4 weeks. He was informed of the importance of frequent follow-up visits to maximize his success with intensive lifestyle modifications for his multiple health conditions.   Objective:   Blood pressure 135/79, pulse 69, temperature 97.8 F (36.6 C), height 6\' 1"  (1.854 m), weight (!) 304 lb (137.9 kg), SpO2 98 %. Body mass index is 40.11 kg/m.  General: Cooperative, alert, well developed, in no acute distress. HEENT: Conjunctivae and lids unremarkable. Cardiovascular: Regular rhythm.  Lungs: Normal work of breathing. Neurologic: No focal deficits.   Lab Results  Component Value Date   CREATININE 1.20 02/22/2021   BUN 23 02/22/2021   NA 139 02/22/2021   K 4.6 02/22/2021   CL 103 02/22/2021   CO2 23 02/22/2021   Lab Results  Component Value Date   ALT 31 02/22/2021   AST 20 02/22/2021   ALKPHOS 63 02/22/2021   BILITOT 0.5 02/22/2021   Lab Results  Component Value Date   HGBA1C 6.3 (H) 02/22/2021   HGBA1C 6.6 (H) 09/16/2020   HGBA1C 6.1 11/28/2019   HGBA1C 6.0 (H) 08/14/2019   HGBA1C 6.0 (H) 03/25/2019   Lab Results  Component Value Date   INSULIN 11.7 02/22/2021  INSULIN 9.1 09/16/2020   INSULIN 10.0 08/14/2019   INSULIN 12.4 03/25/2019   INSULIN 19.2 09/24/2018   Lab Results  Component Value Date   TSH 1.500 03/25/2019   Lab Results  Component Value Date   CHOL 143 02/22/2021   HDL 39 (L) 02/22/2021   LDLCALC 85 02/22/2021   LDLDIRECT 148 (H) 11/15/2007   TRIG 100 02/22/2021   CHOLHDL 5.2 (H) 01/26/2016   Lab Results  Component Value Date   VD25OH 70.4 02/22/2021   VD25OH 52.1  09/16/2020   VD25OH 58.3 02/27/2020   Lab Results  Component Value Date   WBC 7.3 11/30/2017   HGB 15.0 11/30/2017   HCT 46.8 11/30/2017   MCV 86 11/30/2017   PLT 293 11/30/2017   No results found for: IRON, TIBC, FERRITIN  Obesity Behavioral Intervention:   Approximately 15 minutes were spent on the discussion below.  ASK: We discussed the diagnosis of obesity with [name] [name] today and [name] [name] agreed to give [name] permission to discuss obesity behavioral modification therapy today.  ASSESS: [name] [name] has the diagnosis of obesity and his BMI today is 40.2. [name] [name] is in the action stage of change.   ADVISE: [name] [name] was educated on the multiple health risks of obesity as well as the benefit of weight loss to improve his health. He was advised of the need for long term treatment and the importance of lifestyle modifications to improve his current health and to decrease his risk of future health problems.  AGREE: Multiple dietary modification options and treatment options were discussed and [name] [name] agreed to follow the recommendations documented in the above note.  ARRANGE: [name] [name] was educated on the importance of frequent visits to treat obesity as outlined per CMS and USPSTF guidelines and agreed to schedule his next follow up appointment today.  Attestation Statements:   Reviewed by clinician on day of visit: allergies, medications, problem list, medical history, surgical history, family history, social history, and previous encounter notes.   I, Trixie Dredge, am acting as transcriptionist for [name], MD.  I have reviewed the above documentation for accuracy and completeness, and I agree with the above. -  [name], MD

## 2021-09-02 ENCOUNTER — Other Ambulatory Visit: Payer: Self-pay | Admitting: Urology

## 2021-09-03 DIAGNOSIS — H0102B Squamous blepharitis left eye, upper and lower eyelids: Secondary | ICD-10-CM | POA: Diagnosis not present

## 2021-09-03 DIAGNOSIS — G4733 Obstructive sleep apnea (adult) (pediatric): Secondary | ICD-10-CM | POA: Diagnosis not present

## 2021-09-03 DIAGNOSIS — E119 Type 2 diabetes mellitus without complications: Secondary | ICD-10-CM | POA: Diagnosis not present

## 2021-09-03 DIAGNOSIS — H43811 Vitreous degeneration, right eye: Secondary | ICD-10-CM | POA: Diagnosis not present

## 2021-09-03 DIAGNOSIS — H34212 Partial retinal artery occlusion, left eye: Secondary | ICD-10-CM | POA: Diagnosis not present

## 2021-09-03 DIAGNOSIS — H0102A Squamous blepharitis right eye, upper and lower eyelids: Secondary | ICD-10-CM | POA: Diagnosis not present

## 2021-09-03 DIAGNOSIS — Z961 Presence of intraocular lens: Secondary | ICD-10-CM | POA: Diagnosis not present

## 2021-09-06 ENCOUNTER — Telehealth: Payer: Self-pay

## 2021-09-06 ENCOUNTER — Encounter (INDEPENDENT_AMBULATORY_CARE_PROVIDER_SITE_OTHER): Payer: Self-pay | Admitting: Family Medicine

## 2021-09-06 NOTE — Telephone Encounter (Signed)
Pt last seen by Dr. Beasley.  

## 2021-09-06 NOTE — Telephone Encounter (Signed)
Please see message and advise.  Thank you. ° °

## 2021-09-06 NOTE — Telephone Encounter (Signed)
Patient calls nurse line regarding follow up from eye doctor on Friday, 2/3. Patient states that at eye exam, he was told that there was plaque on his left eye. His eye doctor told him he was at an increased risk for stroke and that he should be following up with a cardiologist.   Patient is not currently established with cardiologist. I have scheduled patient a follow up visit with PCP on 09/14/21.   I contacted [facility] and have requested that medical records from visit on Friday be faxed to our office.   Please advise if there is any additional recommendations for patient in the meantime. Patient expresses concerns as he has had a stroke in the past.   Talbot Grumbling, RN

## 2021-09-08 NOTE — Telephone Encounter (Signed)
Called patient back. Informed of below.   Office notes from eye doctor were received and placed in provider's box.   Talbot Grumbling, RN

## 2021-09-14 ENCOUNTER — Encounter: Payer: Self-pay | Admitting: Family Medicine

## 2021-09-14 ENCOUNTER — Telehealth: Payer: Self-pay

## 2021-09-14 ENCOUNTER — Ambulatory Visit: Payer: Medicare PPO | Admitting: Family Medicine

## 2021-09-14 ENCOUNTER — Telehealth: Payer: Self-pay | Admitting: *Deleted

## 2021-09-14 ENCOUNTER — Other Ambulatory Visit: Payer: Self-pay

## 2021-09-14 ENCOUNTER — Ambulatory Visit (HOSPITAL_COMMUNITY)
Admission: RE | Admit: 2021-09-14 | Discharge: 2021-09-14 | Disposition: A | Discharge status: Home / Self Care | Payer: Medicare PPO | Source: Ambulatory Visit | Attending: Family Medicine | Admitting: Family Medicine

## 2021-09-14 VITALS — BP 126/74 | HR 86 | Ht 73.0 in | Wt 316.8 lb

## 2021-09-14 DIAGNOSIS — I700 Atherosclerosis of aorta: Secondary | ICD-10-CM | POA: Insufficient documentation

## 2021-09-14 DIAGNOSIS — R001 Bradycardia, unspecified: Secondary | ICD-10-CM | POA: Insufficient documentation

## 2021-09-14 DIAGNOSIS — E119 Type 2 diabetes mellitus without complications: Secondary | ICD-10-CM

## 2021-09-14 DIAGNOSIS — H34219 Partial retinal artery occlusion, unspecified eye: Secondary | ICD-10-CM | POA: Diagnosis not present

## 2021-09-14 DIAGNOSIS — H34212 Partial retinal artery occlusion, left eye: Secondary | ICD-10-CM | POA: Diagnosis not present

## 2021-09-14 DIAGNOSIS — I110 Hypertensive heart disease with heart failure: Secondary | ICD-10-CM | POA: Diagnosis not present

## 2021-09-14 DIAGNOSIS — I5030 Unspecified diastolic (congestive) heart failure: Secondary | ICD-10-CM | POA: Diagnosis not present

## 2021-09-14 DIAGNOSIS — I080 Rheumatic disorders of both mitral and aortic valves: Secondary | ICD-10-CM | POA: Insufficient documentation

## 2021-09-14 DIAGNOSIS — I5031 Acute diastolic (congestive) heart failure: Secondary | ICD-10-CM | POA: Insufficient documentation

## 2021-09-14 DIAGNOSIS — G4730 Sleep apnea, unspecified: Secondary | ICD-10-CM | POA: Diagnosis not present

## 2021-09-14 LAB — ECHOCARDIOGRAM COMPLETE
AR max vel: 1.92 cm2
AV Peak grad: 7.5 mmHg
Ao pk vel: 1.37 m/s
Area-P 1/2: 3.81 cm2
Calc EF: 38.4 %
Height: 73 in
S' Lateral: 4.1 cm
Single Plane A2C EF: 39.1 %
Single Plane A4C EF: 37.2 %
Weight: 5068.8 oz

## 2021-09-14 LAB — POCT GLYCOSYLATED HEMOGLOBIN (HGB A1C): HbA1c, POC (controlled diabetic range): 6.4 % (ref 0.0–7.0)

## 2021-09-14 NOTE — Assessment & Plan Note (Signed)
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  Patient already follows with vein and vascular and is going to call and schedule an appointment for updated carotid ultrasounds.  Echocardiogram ordered and if abnormal will place cardiology referral.  Patient is currently medically optimized but could consider addition of SGLT2 medication in the future.

## 2021-09-14 NOTE — Telephone Encounter (Signed)
Patient is calling to check on echo results and next steps.  Will forward to MD.  [name] [name]

## 2021-09-14 NOTE — Progress Notes (Signed)
° ° °  SUBJECTIVE:   CHIEF COMPLAINT / HPI:   Hollenhorst plaque Patient was recently seen by ophthalmology who saw Hollenhorst plaque and recommended immediate evaluation.  He reports that he saw the ophthalmologist for an eye infection but denies any changes in his vision.  Denies any chest pain, shortness of breath, headaches, orthopnea.  He reports that he has been compliant with his medications as prescribed.  Denies any problems with his blood pressures.  Was seen by vascular in the past and had carotid endarterectomy.  He was previously receiving yearly carotid ultrasounds but since COVID started he has not been back for another ultrasound.  Diabetes checkup Patient reports that his diabetes is doing well and he is mainly managed by the healthy weight and wellness clinic.  He has been on metformin and Ozempic but was recently switched to [facility].  He is still on the Ozempic at this time because a prior authorization is needed for [facility].  Denies any hypoglycemic events and reports blood sugars are generally in the 130s-140s range.  OBJECTIVE:   BP 126/74    Pulse 86    Ht 6\' 1"  (1.854 m)    Wt (!) 316 lb 12.8 oz (143.7 kg)    SpO2 99%    BMI 41.80 kg/m   Neuro: Pleasant, well-appearing 76 year old male Cardiac: Regular rate and rhythm Respiratory: Normal work of breathing, lungs clear to auscultation bilaterally Abdomen: Soft, nontender, positive bowel sounds MSK: No edema appreciated, no gross abnormalities, ambulates without difficulty  Diabetic Foot Exam - Simple   Simple Foot Form Visual Inspection No deformities, no ulcerations, no other skin breakdown bilaterally: Yes Sensation Testing Intact to touch and monofilament testing bilaterally: Yes Pulse Check Posterior Tibialis and Dorsalis pulse intact bilaterally: Yes Comments     ASSESSMENT/PLAN:   CHF (congestive heart failure), NYHA class II, unspecified failure chronicity, diastolic (HCC) Patient with known  diastolic heart failure.  Most recent echo was in 2017.  Recently saw ophthalmology who noticed Hollenhorst plaque and recommended echocardiogram and evaluation by cardiology.  Will obtain echocardiogram and determine need for cardiology appointment.  We will place a referral for cardiology if abnormal findings on echocardiogram.  Discussed strict ED and return precautions.  Diabetes mellitus ([place]) Patient well-controlled on current medication regimen Ozempic and metformin.  Hemoglobin A1c of 6.4 today.  He is transitioning to [facility] for healthy weight and wellness recommendations.  Follow-up in 3 months for repeat hemoglobin A1c.  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  Patient already follows with vein and vascular and is going to call and schedule an appointment for updated carotid ultrasounds.  Echocardiogram ordered and if abnormal will place cardiology referral.  Patient is currently medically optimized but could consider addition of SGLT2 medication in the future.     Gifford Shave, MD [place]

## 2021-09-14 NOTE — Assessment & Plan Note (Signed)
Patient with known diastolic heart failure.  Most recent echo was in 2017.  Recently saw ophthalmology who noticed Hollenhorst plaque and recommended echocardiogram and evaluation by cardiology.  Will obtain echocardiogram and determine need for cardiology appointment.  We will place a referral for cardiology if abnormal findings on echocardiogram.  Discussed strict ED and return precautions.

## 2021-09-14 NOTE — Assessment & Plan Note (Signed)
Patient well-controlled on current medication regimen Ozempic and metformin.  Hemoglobin A1c of 6.4 today.  He is transitioning to [facility] for healthy weight and wellness recommendations.  Follow-up in 3 months for repeat hemoglobin A1c.

## 2021-09-14 NOTE — Telephone Encounter (Signed)
Pt called to schedule from his 2020 recall that he missed due to covid and also has a new vascular issue he states his eye doctor found last week. I have requested he have their office send this over as a referral and the pt verbalized understanding of this.

## 2021-09-14 NOTE — Telephone Encounter (Signed)
Patient called back and states that he needs a new referral vein and vascular since this is a new problem.  Will go ahead and place a new referral and will update provider that this has been done.  Kosisochukwu Burningham,CMA

## 2021-09-14 NOTE — Patient Instructions (Addendum)
It was good seeing you today.  Regarding your eye issues I want for you to call and schedule an appointment with your vascular doctor, Dr. Scot Dock, at [facility].  We have also ordered an echocardiogram which will be done today.  I want you to continue taking your metformin and Ozempic for your diabetes.  If there are any changes in your echocardiogram we will refer you to cardiology.  If you have any questions or concerns please call the clinic.  I hope you have a great day!

## 2021-09-15 LAB — HM DIABETES EYE EXAM

## 2021-09-17 ENCOUNTER — Encounter (INDEPENDENT_AMBULATORY_CARE_PROVIDER_SITE_OTHER): Payer: Self-pay | Admitting: Family Medicine

## 2021-09-22 ENCOUNTER — Other Ambulatory Visit (INDEPENDENT_AMBULATORY_CARE_PROVIDER_SITE_OTHER): Payer: Self-pay | Admitting: Family Medicine

## 2021-09-22 DIAGNOSIS — E1169 Type 2 diabetes mellitus with other specified complication: Secondary | ICD-10-CM

## 2021-09-22 NOTE — Telephone Encounter (Signed)
When you get a chance can you please check covermymeds for this patient prior authorization for [facility] and let me know if there is a denial on file. I tried to do a prior authorization for [facility] and received a message:  There is an existing case within the [facility] environment that has the same patient, prescriber, and drug. This case must be finalized before proceeding with similar requests. I am unable to pull anything up in covermymeds. Thanks!!

## 2021-09-23 ENCOUNTER — Other Ambulatory Visit (INDEPENDENT_AMBULATORY_CARE_PROVIDER_SITE_OTHER): Payer: Self-pay | Admitting: Family Medicine

## 2021-09-23 DIAGNOSIS — E1169 Type 2 diabetes mellitus with other specified complication: Secondary | ICD-10-CM

## 2021-09-28 ENCOUNTER — Other Ambulatory Visit: Payer: Self-pay

## 2021-09-28 ENCOUNTER — Encounter (INDEPENDENT_AMBULATORY_CARE_PROVIDER_SITE_OTHER): Payer: Self-pay | Admitting: Family Medicine

## 2021-09-28 ENCOUNTER — Ambulatory Visit (INDEPENDENT_AMBULATORY_CARE_PROVIDER_SITE_OTHER): Payer: Medicare PPO | Admitting: Family Medicine

## 2021-09-28 VITALS — BP 126/81 | HR 66 | Temp 97.9°F | Ht 73.0 in | Wt 308.0 lb

## 2021-09-28 DIAGNOSIS — E559 Vitamin D deficiency, unspecified: Secondary | ICD-10-CM

## 2021-09-28 DIAGNOSIS — E1169 Type 2 diabetes mellitus with other specified complication: Secondary | ICD-10-CM | POA: Diagnosis not present

## 2021-09-28 DIAGNOSIS — Z6841 Body Mass Index (BMI) 40.0 and over, adult: Secondary | ICD-10-CM

## 2021-09-28 DIAGNOSIS — E669 Obesity, unspecified: Secondary | ICD-10-CM

## 2021-09-28 MED ORDER — VITAMIN D (ERGOCALCIFEROL) 1.25 MG (50000 UNIT) PO CAPS: 50000.0000 [IU] | ORAL_CAPSULE | ORAL | 0 refills | Status: DC

## 2021-09-29 ENCOUNTER — Other Ambulatory Visit: Payer: Self-pay

## 2021-09-29 ENCOUNTER — Ambulatory Visit: Payer: Medicare PPO | Admitting: Interventional Cardiology

## 2021-09-29 ENCOUNTER — Encounter: Payer: Self-pay | Admitting: Interventional Cardiology

## 2021-09-29 VITALS — BP 142/84 | HR 69 | Ht 73.0 in | Wt 319.0 lb

## 2021-09-29 DIAGNOSIS — Z01818 Encounter for other preprocedural examination: Secondary | ICD-10-CM

## 2021-09-29 DIAGNOSIS — I519 Heart disease, unspecified: Secondary | ICD-10-CM

## 2021-09-29 DIAGNOSIS — R931 Abnormal findings on diagnostic imaging of heart and coronary circulation: Secondary | ICD-10-CM | POA: Diagnosis not present

## 2021-09-29 DIAGNOSIS — I6522 Occlusion and stenosis of left carotid artery: Secondary | ICD-10-CM

## 2021-09-29 DIAGNOSIS — Z01812 Encounter for preprocedural laboratory examination: Secondary | ICD-10-CM | POA: Diagnosis not present

## 2021-09-29 MED ORDER — ASPIRIN EC 81 MG PO TBEC: 81.0000 mg | DELAYED_RELEASE_TABLET | Freq: Every day | ORAL | 3 refills | Status: DC
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
## 2021-09-29 NOTE — Patient Instructions (Addendum)
Medication Instructions:  ?Your physician has recommended you make the following change in your medication: Decrease aspirin to 81 mg by mouth daily ? ? ?Lab Work: ?Lab work to be done today--CBC and BMP ?If you have labs (blood work) drawn today and your tests are completely normal, you will receive your results only by: ?MyChart Message (if you have MyChart) OR ?A paper copy in the mail ?If you have any lab test that is abnormal or we need to change your treatment, we will call you to review the results. ? ? ?Testing/Procedures: ?Your physician has requested that you have a cardiac catheterization. Cardiac catheterization is used to diagnose and/or treat various heart conditions. Doctors may recommend this procedure for a number of different reasons. The most common reason is to evaluate chest pain. Chest pain can be a symptom of coronary artery disease (CAD), and cardiac catheterization can show whether plaque is narrowing or blocking your heart?s arteries. This procedure is also used to evaluate the valves, as well as measure the blood flow and oxygen levels in different parts of your heart. For further information please visit [email or web address]. Please follow instruction sheet, as given. ?Scheduled for October 05, 2021 ? ? ?Follow-Up: ?At [facility], you and your health needs are our priority.  As part of our continuing mission to provide you with exceptional heart care, we have created designated Provider Care Teams.  These Care Teams include your primary Cardiologist (physician) and Advanced Practice Providers (APPs -  Physician Assistants and Nurse Practitioners) who all work together to provide you with the care you need, when you need it. ? ?We recommend signing up for the patient portal called "MyChart".  Sign up information is provided on this After Visit Summary.  MyChart is used to connect with patients for Virtual Visits (Telemedicine).  Patients are able to view lab/test results, encounter notes,  upcoming appointments, etc.  Non-urgent messages can be sent to your provider as well.   ?To learn more about what you can do with MyChart, go to [email or web address].   ? ?Your next appointment:   ?To be arranged after procedure ? ?The format for your next appointment:   ?In Person ? ?Provider:   ?[name], MD Or APP  ? ? ?Other Instructions ? ?[facility] ?[address], [address] ?[place] [address] ?Dept: [phone] ?Loc: [phone] ? ?[name] [name]  09/29/2021 ? ?You are scheduled for a Cardiac Catheterization on Tuesday, March 7 with Dr. [name]. ? ?1. Please arrive at the Main Entrance A at [facility]: [place],  [address] at 10:00 AM (This time is two hours before your procedure to ensure your preparation). Free valet parking service is available.  ? ?Special note: Every effort is made to have your procedure done on time. Please understand that emergencies sometimes delay scheduled procedures. ? ?2. Diet: Do not eat solid foods after midnight.  You may have clear liquids until 5 AM upon the day of the procedure. ? ?3. Labs: done in office on September 29, 2021 ?4. Medication instructions in preparation for your procedure: ? ? Contrast Allergy: No ? ?Do not take furosemide the morning of the procedure. ?Do not take any diabetes medication the morning of the procedure. Do not take metformin morning of the procedure and for 48 hours after procedure ? ? ? ? ?On the morning of your procedure, take Aspirin and any morning medicines NOT  listed above.  You may use sips of water. ? ?5. Plan for same day discharge, you will only stay overnight if medically necessary.  ?6. You MUST have a responsible adult to drive you home. ?7. An adult MUST be with you the first 24 hours after you arrive home. ?8. Bring a current list of your medications, and the last time and date medication  taken. ?9. Bring ID and current insurance cards. ?10.Please wear clothes that are easy to get on and off and wear slip-on shoes. ? ?Thank you for allowing [name] to care for you! ?  -- [facility] ? ? ?
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
## 2021-09-29 NOTE — Patient Instructions (Signed)
Please continue using your CPAP regularly. While your insurance requires that you use CPAP at least 4 hours each night on 70% of the nights, I recommend, that you not skip any nights and use it throughout the night if you can. Getting used to CPAP and staying with the treatment long term does take time and patience and discipline. Untreated obstructive sleep apnea when it is moderate to severe can have an adverse impact on cardiovascular health and raise her risk for heart disease, arrhythmias, hypertension, congestive heart failure, stroke and diabetes. Untreated obstructive sleep apnea causes sleep disruption, nonrestorative sleep, and sleep deprivation. This can have an impact on your day to day functioning and cause daytime sleepiness and impairment of cognitive function, memory loss, mood disturbance, and problems focussing. Using CPAP regularly can improve these symptoms. ? ? ?Follow up in 1 year  ?
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
## 2021-09-29 NOTE — Progress Notes (Signed)
Chief Complaint:   OBESITY [name] [name] is here to discuss his progress with his obesity treatment plan along with follow-up of his obesity related diagnoses. Will is on the Category 4 Plan and states he is following his eating plan approximately 97% of the time. [name] states he is walking for 15-20 minutes 3-4 times per week.  Today's visit was #: 75 Starting weight: 340 lbs Starting date: 12/28/2016 Today's weight: 308 lbs Today's date: 09/28/2021 Total lbs lost to date: 32 Total lbs lost since last in-office visit: 0  Interim History: [name] [name] has been struggling with weight gain. He is off his GLP-1 for his diabetes mellitus and he is working on weight loss. His water weight is up and no increase in fat per our bioimpedance scale.   Subjective:   1. Vitamin D deficiency Javan is stable on Vit D, with no side effects noted. He requests a refill.   2. Type 2 diabetes mellitus with other specified complication, without long-term current use of insulin (HCC) [name] [name] is off Ozempic due to poor insurance coverage. [name] [name] is not covered by his insurance. He notes increased polyphagia.  Assessment/Plan:   1. Vitamin D deficiency Low Vitamin D level contributes to fatigue and are associated with obesity, breast, and colon cancer. We will refill prescription Vitamin D for 1 month. [name] [name] will follow-up for routine testing of Vitamin D, at least 2-3 times per year to avoid over-replacement.  - Vitamin D, Ergocalciferol, (DRISDOL) 1.25 MG (50000 UNIT) CAPS capsule; Take 1 capsule (50,000 Units total) by mouth every 14 (fourteen) days.  Dispense: 2 capsule; Refill: 0  2. Type 2 diabetes mellitus with other specified complication, without long-term current use of insulin (HCC) [name] will let us know what GLP-1 his insurance will cover and we will prescribe that one once he lets [name] know. Good blood sugar control is important to decrease the likelihood of diabetic complications such as  nephropathy, neuropathy, limb loss, blindness, coronary artery disease, and death. Intensive lifestyle modification including diet, exercise and weight loss are the first line of treatment for diabetes.   3. Obesity with current BMI of 40.7 [name] [name] is currently in the action stage of change. As such, his goal is to continue with weight loss efforts. He has agreed to the Category 4 Plan.   Exercise goals: As is.  Behavioral modification strategies: increasing lean protein intake and meal planning and cooking strategies.  [name] [name] has agreed to follow-up with our clinic in 3 weeks. He was informed of the importance of frequent follow-up visits to maximize his success with intensive lifestyle modifications for his multiple health conditions.   Objective:   Blood pressure 126/81, pulse 66, temperature 97.9 F (36.6 C), height 6\' 1"  (1.854 m), weight (!) 308 lb (139.7 kg), SpO2 95 %. Body mass index is 40.64 kg/m.  General: Cooperative, alert, well developed, in no acute distress. HEENT: Conjunctivae and lids unremarkable. Cardiovascular: Regular rhythm.  Lungs: Normal work of breathing. Neurologic: No focal deficits.   Lab Results  Component Value Date   CREATININE 1.20 02/22/2021   BUN 23 02/22/2021   NA 139 02/22/2021   K 4.6 02/22/2021   CL 103 02/22/2021   CO2 23 02/22/2021   Lab Results  Component Value Date   ALT 31 02/22/2021   AST 20 02/22/2021   ALKPHOS 63 02/22/2021   BILITOT 0.5 02/22/2021   Lab Results  Component Value Date   HGBA1C 6.4 09/14/2021   HGBA1C 6.3 (H) 02/22/2021  HGBA1C 6.6 (H) 09/16/2020   HGBA1C 6.1 11/28/2019   HGBA1C 6.0 (H) 08/14/2019   Lab Results  Component Value Date   INSULIN 11.7 02/22/2021   INSULIN 9.1 09/16/2020   INSULIN 10.0 08/14/2019   INSULIN 12.4 03/25/2019   INSULIN 19.2 09/24/2018   Lab Results  Component Value Date   TSH 1.500 03/25/2019   Lab Results  Component Value Date   CHOL 143 02/22/2021   HDL 39 (L)  02/22/2021   LDLCALC 85 02/22/2021   LDLDIRECT 148 (H) 11/15/2007   TRIG 100 02/22/2021   CHOLHDL 5.2 (H) 01/26/2016   Lab Results  Component Value Date   VD25OH 70.4 02/22/2021   VD25OH 52.1 09/16/2020   VD25OH 58.3 02/27/2020   Lab Results  Component Value Date   WBC 7.3 11/30/2017   HGB 15.0 11/30/2017   HCT 46.8 11/30/2017   MCV 86 11/30/2017   PLT 293 11/30/2017   No results found for: IRON, TIBC, FERRITIN  Obesity Behavioral Intervention:   Approximately 15 minutes were spent on the discussion below.  ASK: We discussed the diagnosis of obesity with [name] [name] today and [name] [name] agreed to give [name] permission to discuss obesity behavioral modification therapy today.  ASSESS: [name] [name] has the diagnosis of obesity and his BMI today is 40.7. [name] [name] is in the action stage of change.   ADVISE: [name] [name] was educated on the multiple health risks of obesity as well as the benefit of weight loss to improve his health. He was advised of the need for long term treatment and the importance of lifestyle modifications to improve his current health and to decrease his risk of future health problems.  AGREE: Multiple dietary modification options and treatment options were discussed and [name] [name] agreed to follow the recommendations documented in the above note.  ARRANGE: [name] [name] was educated on the importance of frequent visits to treat obesity as outlined per CMS and USPSTF guidelines and agreed to schedule his next follow up appointment today.  Attestation Statements:   Reviewed by clinician on day of visit: allergies, medications, problem list, medical history, surgical history, family history, social history, and previous encounter notes.   I, Trixie Dredge, am acting as transcriptionist for [name] Nip, MD.  I have reviewed the above documentation for accuracy and completeness, and I agree with the above. -  [name] Nip, MD

## 2021-09-30 ENCOUNTER — Encounter: Payer: Self-pay | Admitting: Family Medicine

## 2021-09-30 ENCOUNTER — Other Ambulatory Visit: Payer: Self-pay

## 2021-09-30 ENCOUNTER — Other Ambulatory Visit: Payer: Self-pay | Admitting: Family Medicine

## 2021-09-30 ENCOUNTER — Ambulatory Visit: Payer: Medicare PPO | Admitting: Family Medicine

## 2021-09-30 VITALS — BP 151/83 | HR 85 | Ht 73.0 in | Wt 318.5 lb

## 2021-09-30 DIAGNOSIS — G4733 Obstructive sleep apnea (adult) (pediatric): Secondary | ICD-10-CM | POA: Diagnosis not present

## 2021-09-30 DIAGNOSIS — Z9989 Dependence on other enabling machines and devices: Secondary | ICD-10-CM

## 2021-09-30 DIAGNOSIS — F3289 Other specified depressive episodes: Secondary | ICD-10-CM

## 2021-09-30 LAB — BASIC METABOLIC PANEL
BUN/Creatinine Ratio: 16 (ref 10–24)
BUN: 20 mg/dL (ref 8–27)
CO2: 22 mmol/L (ref 20–29)
Calcium: 9.4 mg/dL (ref 8.6–10.2)
Chloride: 106 mmol/L (ref 96–106)
Creatinine, Ser: 1.25 mg/dL (ref 0.76–1.27)
Glucose: 109 mg/dL — ABNORMAL HIGH (ref 70–99)
Potassium: 4.2 mmol/L (ref 3.5–5.2)
Sodium: 142 mmol/L (ref 134–144)
eGFR: 60 mL/min/{1.73_m2} (ref >59)

## 2021-09-30 LAB — CBC
Hematocrit: 46.9 % (ref 37.5–51.0)
Hemoglobin: 15.3 g/dL (ref 13.0–17.7)
MCH: 28.3 pg (ref 26.6–33.0)
MCHC: 32.6 g/dL (ref 31.5–35.7)
MCV: 87 fL (ref 79–97)
Platelets: 288 10*3/uL (ref 150–450)
RBC: 5.4 x10E6/uL (ref 4.14–5.80)
RDW: 14 % (ref 11.6–15.4)
WBC: 7.3 10*3/uL (ref 3.4–10.8)

## 2021-10-04 ENCOUNTER — Telehealth: Payer: Self-pay | Admitting: *Deleted

## 2021-10-04 NOTE — Telephone Encounter (Addendum)
Cardiac catheterization scheduled at [facility] for: Tuesday October 05, 2021 12 Noon ?[facility] Main Entrance [facility]) at: 10 AM ? ? ?Diet-no solid food after midnight prior to cath, clear liquids until 5 AM day of procedure. ? ?Medication instructions for procedure: ?-Hold: ? Metformin-day of procedure and 48 hours post procedure ? Lasix-AM of procedure ?-Except hold medications usual morning medications can be taken pre-cath with sips of water including aspirin 81 mg. ?   ?Must have responsible adult to drive home post procedure and be with patient first 24 hours after arriving home. * ? ?[facility] does allow one visitor to wait in the waiting room during the time you are there. ? ? ?Patient reports does not currently have any new symptoms concerning for COVID-19 and no household members with COVID-19 like illness.  ? ? ?Reviewed procedure instructions with patient. ? ?     * Patient reports that he does not have anyone to be with him once he is home if same day discharge. ?,      Patient states Dr Irish Lack is aware and told patient he could stay overnight at hospital after procedure. ?       Patient reports he would have transportation home when discharged the next day. ? ?       RN Cath Lab aware and note on cath schedule.  ? ?  ? ? ? ? ?

## 2021-10-05 ENCOUNTER — Ambulatory Visit (HOSPITAL_COMMUNITY)
Admission: RE | Admit: 2021-10-05 | Discharge: 2021-10-06 | Disposition: A | Discharge status: Home / Self Care | Payer: Medicare PPO | Source: Ambulatory Visit | Attending: Interventional Cardiology | Admitting: Interventional Cardiology

## 2021-10-05 ENCOUNTER — Ambulatory Visit (HOSPITAL_COMMUNITY)
Admission: RE | Disposition: A | Discharge status: Home / Self Care | Payer: Medicare PPO | Source: Ambulatory Visit | Attending: Interventional Cardiology

## 2021-10-05 ENCOUNTER — Other Ambulatory Visit: Payer: Self-pay

## 2021-10-05 DIAGNOSIS — I5031 Acute diastolic (congestive) heart failure: Secondary | ICD-10-CM | POA: Insufficient documentation

## 2021-10-05 DIAGNOSIS — I2510 Atherosclerotic heart disease of native coronary artery without angina pectoris: Secondary | ICD-10-CM | POA: Diagnosis present

## 2021-10-05 DIAGNOSIS — I4510 Unspecified right bundle-branch block: Secondary | ICD-10-CM | POA: Insufficient documentation

## 2021-10-05 DIAGNOSIS — I25118 Atherosclerotic heart disease of native coronary artery with other forms of angina pectoris: Secondary | ICD-10-CM | POA: Diagnosis not present

## 2021-10-05 DIAGNOSIS — I5030 Unspecified diastolic (congestive) heart failure: Secondary | ICD-10-CM | POA: Diagnosis present

## 2021-10-05 DIAGNOSIS — E1169 Type 2 diabetes mellitus with other specified complication: Secondary | ICD-10-CM

## 2021-10-05 DIAGNOSIS — I10 Essential (primary) hypertension: Secondary | ICD-10-CM | POA: Diagnosis present

## 2021-10-05 DIAGNOSIS — Z955 Presence of coronary angioplasty implant and graft: Secondary | ICD-10-CM

## 2021-10-05 DIAGNOSIS — E785 Hyperlipidemia, unspecified: Secondary | ICD-10-CM | POA: Diagnosis present

## 2021-10-05 DIAGNOSIS — Z6841 Body Mass Index (BMI) 40.0 and over, adult: Secondary | ICD-10-CM | POA: Diagnosis not present

## 2021-10-05 DIAGNOSIS — I110 Hypertensive heart disease with heart failure: Secondary | ICD-10-CM | POA: Insufficient documentation

## 2021-10-05 DIAGNOSIS — E7800 Pure hypercholesterolemia, unspecified: Secondary | ICD-10-CM | POA: Insufficient documentation

## 2021-10-05 DIAGNOSIS — E119 Type 2 diabetes mellitus without complications: Secondary | ICD-10-CM | POA: Diagnosis not present

## 2021-10-05 HISTORY — PX: CORONARY STENT INTERVENTION: CATH118234

## 2021-10-05 HISTORY — PX: RIGHT/LEFT HEART CATH AND CORONARY ANGIOGRAPHY: CATH118266

## 2021-10-05 HISTORY — PX: CORONARY ULTRASOUND/IVUS: CATH118244

## 2021-10-05 LAB — POCT I-STAT EG7
Acid-Base Excess: 0 mmol/L (ref 0.0–2.0)
Acid-Base Excess: 0 mmol/L (ref 0.0–2.0)
Bicarbonate: 25 mmol/L (ref 20.0–28.0)
Bicarbonate: 25.5 mmol/L (ref 20.0–28.0)
Calcium, Ion: 1.24 mmol/L (ref 1.15–1.40)
Calcium, Ion: 1.24 mmol/L (ref 1.15–1.40)
HCT: 42 % (ref 39.0–52.0)
HCT: 43 % (ref 39.0–52.0)
Hemoglobin: 14.3 g/dL (ref 13.0–17.0)
Hemoglobin: 14.6 g/dL (ref 13.0–17.0)
O2 Saturation: 74 %
O2 Saturation: 77 %
Potassium: 3.8 mmol/L (ref 3.5–5.1)
Potassium: 3.9 mmol/L (ref 3.5–5.1)
Sodium: 141 mmol/L (ref 135–145)
Sodium: 141 mmol/L (ref 135–145)
TCO2: 26 mmol/L (ref 22–32)
TCO2: 27 mmol/L (ref 22–32)
pCO2, Ven: 43 mmHg — ABNORMAL LOW (ref 44–60)
pCO2, Ven: 44.3 mmHg (ref 44–60)
pH, Ven: 7.369 (ref 7.25–7.43)
pH, Ven: 7.372 (ref 7.25–7.43)
pO2, Ven: 41 mmHg (ref 32–45)
pO2, Ven: 43 mmHg (ref 32–45)

## 2021-10-05 LAB — POCT I-STAT 7, (LYTES, BLD GAS, ICA,H+H)
Acid-base deficit: 1 mmol/L (ref 0.0–2.0)
Bicarbonate: 24.3 mmol/L (ref 20.0–28.0)
Calcium, Ion: 1.19 mmol/L (ref 1.15–1.40)
HCT: 41 % (ref 39.0–52.0)
Hemoglobin: 13.9 g/dL (ref 13.0–17.0)
O2 Saturation: 93 %
Potassium: 3.7 mmol/L (ref 3.5–5.1)
Sodium: 142 mmol/L (ref 135–145)
TCO2: 26 mmol/L (ref 22–32)
pCO2 arterial: 42 mmHg (ref 32–48)
pH, Arterial: 7.37 (ref 7.35–7.45)
pO2, Arterial: 70 mmHg — ABNORMAL LOW (ref 83–108)

## 2021-10-05 LAB — GLUCOSE, CAPILLARY
Glucose-Capillary: 160 mg/dL — ABNORMAL HIGH (ref 70–99)
Glucose-Capillary: 127 mg/dL — ABNORMAL HIGH (ref 70–99)
Glucose-Capillary: 161 mg/dL — ABNORMAL HIGH (ref 70–99)

## 2021-10-05 LAB — POCT ACTIVATED CLOTTING TIME: Activated Clotting Time: 299 seconds

## 2021-10-05 SURGERY — RIGHT/LEFT HEART CATH AND CORONARY ANGIOGRAPHY
Anesthesia: LOCAL

## 2021-10-05 MED ORDER — SODIUM CHLORIDE 0.9% FLUSH: 3.0000 mL | INTRAVENOUS | Status: DC | PRN

## 2021-10-05 MED ORDER — SODIUM CHLORIDE 0.9% FLUSH
3.0000 mL | Freq: Two times a day (BID) | INTRAVENOUS | Status: DC
  Administered 2021-10-05 – 2021-10-06 (×3): 3 mL via INTRAVENOUS

## 2021-10-05 MED ORDER — VIBEGRON 75 MG PO TABS: 75.0000 mg | ORAL_TABLET | Freq: Every day | ORAL | Status: DC

## 2021-10-05 MED ORDER — DARIFENACIN HYDROBROMIDE ER 7.5 MG PO TB24
7.5000 mg | ORAL_TABLET | Freq: Every day | ORAL | Status: DC
Start: 2021-10-05 — End: 2021-10-06
  Administered 2021-10-05 – 2021-10-06 (×2): 7.5 mg via ORAL
  Filled 2021-10-05 (×2): qty 1

## 2021-10-05 MED ORDER — MIDAZOLAM HCL 2 MG/2ML IJ SOLN
INTRAMUSCULAR | Status: AC
  Filled 2021-10-05: qty 2

## 2021-10-05 MED ORDER — ESCITALOPRAM OXALATE 10 MG PO TABS
20.0000 mg | ORAL_TABLET | Freq: Every day | ORAL | Status: DC
  Administered 2021-10-05 – 2021-10-06 (×2): 20 mg via ORAL
  Filled 2021-10-05 (×2): qty 2

## 2021-10-05 MED ORDER — SODIUM CHLORIDE 0.9% FLUSH
3.0000 mL | Freq: Two times a day (BID) | INTRAVENOUS | Status: DC
  Administered 2021-10-06: 3 mL via INTRAVENOUS

## 2021-10-05 MED ORDER — ASPIRIN EC 81 MG PO TBEC
81.0000 mg | DELAYED_RELEASE_TABLET | Freq: Every day | ORAL | Status: DC
  Administered 2021-10-06: 81 mg via ORAL
  Filled 2021-10-05: qty 1

## 2021-10-05 MED ORDER — SODIUM CHLORIDE 0.9 % WEIGHT BASED INFUSION: 1.0000 mL/kg/h | INTRAVENOUS | Status: DC

## 2021-10-05 MED ORDER — TIROFIBAN HCL IN NACL 5-0.9 MG/100ML-% IV SOLN
INTRAVENOUS | Status: AC
  Filled 2021-10-05: qty 100

## 2021-10-05 MED ORDER — FENTANYL CITRATE (PF) 100 MCG/2ML IJ SOLN
INTRAMUSCULAR | Status: AC
  Filled 2021-10-05: qty 2

## 2021-10-05 MED ORDER — LIDOCAINE HCL (PF) 1 % IJ SOLN
INTRAMUSCULAR | Status: AC
  Filled 2021-10-05: qty 30

## 2021-10-05 MED ORDER — HEPARIN (PORCINE) IN NACL 1000-0.9 UT/500ML-% IV SOLN
INTRAVENOUS | Status: AC
  Filled 2021-10-05: qty 1000

## 2021-10-05 MED ORDER — SODIUM CHLORIDE 0.9 % IV SOLN: 250.0000 mL | INTRAVENOUS | Status: DC | PRN

## 2021-10-05 MED ORDER — SODIUM CHLORIDE 0.9 % IV SOLN: INTRAVENOUS | Status: AC

## 2021-10-05 MED ORDER — TIROFIBAN (AGGRASTAT) BOLUS VIA INFUSION
INTRAVENOUS | Status: DC | PRN
  Administered 2021-10-05: 3402.5 ug via INTRAVENOUS

## 2021-10-05 MED ORDER — SODIUM CHLORIDE 0.9 % WEIGHT BASED INFUSION
3.0000 mL/kg/h | INTRAVENOUS | Status: DC
  Administered 2021-10-05: 3 mL/kg/h via INTRAVENOUS

## 2021-10-05 MED ORDER — ONDANSETRON HCL 4 MG/2ML IJ SOLN: 4.0000 mg | Freq: Four times a day (QID) | INTRAMUSCULAR | Status: DC | PRN

## 2021-10-05 MED ORDER — DOXYCYCLINE HYCLATE 50 MG PO CAPS
50.0000 mg | ORAL_CAPSULE | Freq: Every day | ORAL | Status: DC
  Administered 2021-10-05 – 2021-10-06 (×2): 50 mg via ORAL
  Filled 2021-10-05 (×2): qty 1

## 2021-10-05 MED ORDER — MIDAZOLAM HCL 2 MG/2ML IJ SOLN
INTRAMUSCULAR | Status: DC | PRN
Start: 2021-10-05 — End: 2021-10-05
  Administered 2021-10-05: 2 mg via INTRAVENOUS
  Administered 2021-10-05 (×2): 1 mg via INTRAVENOUS

## 2021-10-05 MED ORDER — CLOPIDOGREL BISULFATE 300 MG PO TABS
ORAL_TABLET | ORAL | Status: AC
  Filled 2021-10-05: qty 2

## 2021-10-05 MED ORDER — HEPARIN SODIUM (PORCINE) 1000 UNIT/ML IJ SOLN
INTRAMUSCULAR | Status: AC
Start: 2021-10-05 — End: ?
  Filled 2021-10-05: qty 10

## 2021-10-05 MED ORDER — TIRZEPATIDE 12.5 MG/0.5ML ~~LOC~~ SOAJ: 12.5000 mg | SUBCUTANEOUS | Status: DC

## 2021-10-05 MED ORDER — METOPROLOL SUCCINATE ER 25 MG PO TB24
12.5000 mg | ORAL_TABLET | Freq: Every day | ORAL | Status: DC
  Administered 2021-10-05 – 2021-10-06 (×2): 12.5 mg via ORAL
  Filled 2021-10-05 (×2): qty 1

## 2021-10-05 MED ORDER — ROSUVASTATIN CALCIUM 20 MG PO TABS
40.0000 mg | ORAL_TABLET | Freq: Every day | ORAL | Status: DC
  Administered 2021-10-05 – 2021-10-06 (×2): 40 mg via ORAL
  Filled 2021-10-05 (×2): qty 2

## 2021-10-05 MED ORDER — VERAPAMIL HCL 2.5 MG/ML IV SOLN
INTRAVENOUS | Status: AC
  Filled 2021-10-05: qty 2

## 2021-10-05 MED ORDER — CLOPIDOGREL BISULFATE 75 MG PO TABS
75.0000 mg | ORAL_TABLET | Freq: Every day | ORAL | Status: DC
  Administered 2021-10-06: 75 mg via ORAL
  Filled 2021-10-05: qty 1

## 2021-10-05 MED ORDER — ASPIRIN 81 MG PO CHEW
81.0000 mg | CHEWABLE_TABLET | Freq: Every day | ORAL | Status: DC
  Administered 2021-10-06: 81 mg via ORAL
  Filled 2021-10-05: qty 1

## 2021-10-05 MED ORDER — NITROGLYCERIN 1 MG/10 ML FOR IR/CATH LAB
INTRA_ARTERIAL | Status: AC
  Filled 2021-10-05: qty 10

## 2021-10-05 MED ORDER — LIDOCAINE HCL (PF) 1 % IJ SOLN
INTRAMUSCULAR | Status: DC | PRN
Start: 2021-10-05 — End: 2021-10-05
  Administered 2021-10-05 (×2): 2 mL

## 2021-10-05 MED ORDER — CLOPIDOGREL BISULFATE 300 MG PO TABS
ORAL_TABLET | ORAL | Status: DC | PRN
  Administered 2021-10-05: 600 mg via ORAL

## 2021-10-05 MED ORDER — HYDRALAZINE HCL 20 MG/ML IJ SOLN: 10.0000 mg | INTRAMUSCULAR | Status: AC | PRN

## 2021-10-05 MED ORDER — ERYTHROMYCIN 5 MG/GM OP OINT
1.0000 "application " | TOPICAL_OINTMENT | Freq: Every day | OPHTHALMIC | Status: DC
  Administered 2021-10-05: 1 via OPHTHALMIC
  Filled 2021-10-05: qty 3.5

## 2021-10-05 MED ORDER — NITROGLYCERIN 1 MG/10 ML FOR IR/CATH LAB
INTRA_ARTERIAL | Status: DC | PRN
  Administered 2021-10-05: 400 ug via INTRA_ARTERIAL

## 2021-10-05 MED ORDER — HEPARIN SODIUM (PORCINE) 1000 UNIT/ML IJ SOLN
INTRAMUSCULAR | Status: DC | PRN
Start: 2021-10-05 — End: 2021-10-05
  Administered 2021-10-05: 6000 [IU] via INTRAVENOUS
  Administered 2021-10-05: 8000 [IU] via INTRAVENOUS

## 2021-10-05 MED ORDER — VERAPAMIL HCL 2.5 MG/ML IV SOLN
INTRAVENOUS | Status: DC | PRN
  Administered 2021-10-05: 10 mL via INTRA_ARTERIAL

## 2021-10-05 MED ORDER — IOHEXOL 350 MG/ML SOLN
INTRAVENOUS | Status: DC | PRN
Start: 2021-10-05 — End: 2021-10-05
  Administered 2021-10-05: 150 mL

## 2021-10-05 MED ORDER — ASPIRIN 81 MG PO CHEW: 81.0000 mg | CHEWABLE_TABLET | ORAL | Status: DC

## 2021-10-05 MED ORDER — LABETALOL HCL 5 MG/ML IV SOLN: 10.0000 mg | INTRAVENOUS | Status: AC | PRN

## 2021-10-05 MED ORDER — BUPROPION HCL ER (SR) 100 MG PO TB12
200.0000 mg | ORAL_TABLET | Freq: Every morning | ORAL | Status: DC
  Administered 2021-10-06: 200 mg via ORAL
  Filled 2021-10-05: qty 2

## 2021-10-05 MED ORDER — FAMOTIDINE IN NACL 20-0.9 MG/50ML-% IV SOLN
INTRAVENOUS | Status: AC
  Filled 2021-10-05: qty 50

## 2021-10-05 MED ORDER — ACETAMINOPHEN 325 MG PO TABS: 650.0000 mg | ORAL_TABLET | ORAL | Status: DC | PRN

## 2021-10-05 MED ORDER — FENTANYL CITRATE (PF) 100 MCG/2ML IJ SOLN
INTRAMUSCULAR | Status: DC | PRN
  Administered 2021-10-05 (×4): 25 ug via INTRAVENOUS

## 2021-10-05 MED ORDER — FUROSEMIDE 40 MG PO TABS
40.0000 mg | ORAL_TABLET | Freq: Every day | ORAL | Status: DC
  Administered 2021-10-05 – 2021-10-06 (×2): 40 mg via ORAL
  Filled 2021-10-05 (×2): qty 1

## 2021-10-05 SURGICAL SUPPLY — 24 items
BALLN ~~LOC~~ EUPHORA RX 3.75X8 (BALLOONS) ×3
BALLOON ~~LOC~~ EUPHORA RX 3.75X8 (BALLOONS) IMPLANT
CATH BALLN WEDGE 5F 110CM (CATHETERS) ×2 IMPLANT
CATH INFINITI 5 FR AL2 (CATHETERS) ×2 IMPLANT
CATH INFINITI 5FR JL5 (CATHETERS) ×2 IMPLANT
CATH INFINITI 5FR MULTPACK ANG (CATHETERS) ×2 IMPLANT
CATH LAUNCHER 6FR EBU 3.75 (CATHETERS) ×2 IMPLANT
CATH LAUNCHER 6FR EBU3.5 (CATHETERS) ×2 IMPLANT
CATH OPTICROSS HD (CATHETERS) ×2 IMPLANT
DEVICE RAD COMP TR BAND LRG (VASCULAR PRODUCTS) ×2 IMPLANT
GLIDESHEATH SLEND SS 6F .021 (SHEATH) ×2 IMPLANT
GUIDEWIRE .025 260CM (WIRE) ×2 IMPLANT
GUIDEWIRE INQWIRE 1.5J.035X260 (WIRE) IMPLANT
INQWIRE 1.5J .035X260CM (WIRE) ×3
KIT ENCORE 26 ADVANTAGE (KITS) ×2 IMPLANT
KIT HEART LEFT (KITS) ×3 IMPLANT
KIT HEMO VALVE WATCHDOG (MISCELLANEOUS) ×2 IMPLANT
PACK CARDIAC CATHETERIZATION (CUSTOM PROCEDURE TRAY) ×3 IMPLANT
SHEATH GLIDE SLENDER 4/5FR (SHEATH) ×2 IMPLANT
SLED PULL BACK IVUS (MISCELLANEOUS) ×2 IMPLANT
STENT ONYX FRONTIER 3.5X12 (Permanent Stent) ×2 IMPLANT
TRANSDUCER W/STOPCOCK (MISCELLANEOUS) ×3 IMPLANT
TUBING CIL FLEX 10 FLL-RA (TUBING) ×3 IMPLANT
WIRE ASAHI PROWATER 180CM (WIRE) ×2 IMPLANT

## 2021-10-05 NOTE — Interval H&P Note (Signed)
Cath Lab Visit (complete for each Cath Lab visit) ? ?Clinical Evaluation Leading to the Procedure:  ? ?ACS: No. ? ?Non-ACS:   ? ?Anginal Classification: CCS II ? ?Anti-ischemic medical therapy: Minimal Therapy (1 class of medications) ? ?Non-Invasive Test Results: High-risk stress test findings: cardiac mortality >3%/year ? ?Prior CABG: No previous CABG ? ?Low EF ? ? ? ?History and Physical Interval Note: ? ?10/05/2021 ?12:59 PM ? ?[name] [name]  has presented today for surgery, with the diagnosis of decreased EF, shortness of breath.  The various methods of treatment have been discussed with the patient and family. After consideration of risks, benefits and other options for treatment, the patient has consented to  Procedure(s): ?RIGHT/LEFT HEART CATH AND CORONARY ANGIOGRAPHY (N/A) as a surgical intervention.  The patient's history has been reviewed, patient examined, no change in status, stable for surgery.  I have reviewed the patient's chart and labs.  Questions were answered to the patient's satisfaction.   ? ? ?[name] [name] ? ? ?

## 2021-10-06 ENCOUNTER — Telehealth: Payer: Self-pay

## 2021-10-06 ENCOUNTER — Other Ambulatory Visit (HOSPITAL_COMMUNITY): Payer: Self-pay

## 2021-10-06 ENCOUNTER — Encounter (HOSPITAL_COMMUNITY): Payer: Medicare PPO

## 2021-10-06 ENCOUNTER — Encounter (HOSPITAL_COMMUNITY): Payer: Self-pay | Admitting: Interventional Cardiology

## 2021-10-06 DIAGNOSIS — I4510 Unspecified right bundle-branch block: Secondary | ICD-10-CM | POA: Diagnosis not present

## 2021-10-06 DIAGNOSIS — Z955 Presence of coronary angioplasty implant and graft: Secondary | ICD-10-CM | POA: Diagnosis not present

## 2021-10-06 DIAGNOSIS — E1169 Type 2 diabetes mellitus with other specified complication: Secondary | ICD-10-CM | POA: Diagnosis not present

## 2021-10-06 DIAGNOSIS — E119 Type 2 diabetes mellitus without complications: Secondary | ICD-10-CM | POA: Diagnosis not present

## 2021-10-06 DIAGNOSIS — I25118 Atherosclerotic heart disease of native coronary artery with other forms of angina pectoris: Secondary | ICD-10-CM | POA: Diagnosis not present

## 2021-10-06 DIAGNOSIS — E7800 Pure hypercholesterolemia, unspecified: Secondary | ICD-10-CM | POA: Diagnosis not present

## 2021-10-06 DIAGNOSIS — I110 Hypertensive heart disease with heart failure: Secondary | ICD-10-CM | POA: Diagnosis not present

## 2021-10-06 DIAGNOSIS — I5031 Acute diastolic (congestive) heart failure: Secondary | ICD-10-CM | POA: Diagnosis not present

## 2021-10-06 DIAGNOSIS — I25110 Atherosclerotic heart disease of native coronary artery with unstable angina pectoris: Secondary | ICD-10-CM | POA: Diagnosis not present

## 2021-10-06 DIAGNOSIS — I10 Essential (primary) hypertension: Secondary | ICD-10-CM | POA: Diagnosis not present

## 2021-10-06 DIAGNOSIS — E1159 Type 2 diabetes mellitus with other circulatory complications: Secondary | ICD-10-CM | POA: Diagnosis not present

## 2021-10-06 DIAGNOSIS — Z6841 Body Mass Index (BMI) 40.0 and over, adult: Secondary | ICD-10-CM | POA: Diagnosis not present

## 2021-10-06 LAB — BASIC METABOLIC PANEL
Anion gap: 9 (ref 5–15)
BUN: 22 mg/dL (ref 8–23)
CO2: 23 mmol/L (ref 22–32)
Calcium: 8.3 mg/dL — ABNORMAL LOW (ref 8.9–10.3)
Chloride: 102 mmol/L (ref 98–111)
Creatinine, Ser: 1.4 mg/dL — ABNORMAL HIGH (ref 0.61–1.24)
GFR, Estimated: 53 mL/min — ABNORMAL LOW (ref >60)
Glucose, Bld: 136 mg/dL — ABNORMAL HIGH (ref 70–99)
Potassium: 3.6 mmol/L (ref 3.5–5.1)
Sodium: 134 mmol/L — ABNORMAL LOW (ref 135–145)

## 2021-10-06 LAB — CBC
HCT: 44.5 % (ref 39.0–52.0)
Hemoglobin: 14.7 g/dL (ref 13.0–17.0)
MCH: 28.7 pg (ref 26.0–34.0)
MCHC: 33 g/dL (ref 30.0–36.0)
MCV: 86.7 fL (ref 80.0–100.0)
Platelets: 235 10*3/uL (ref 150–400)
RBC: 5.13 MIL/uL (ref 4.22–5.81)
RDW: 15.2 % (ref 11.5–15.5)
WBC: 6.6 10*3/uL (ref 4.0–10.5)
nRBC: 0 % (ref 0.0–0.2)

## 2021-10-06 LAB — GLUCOSE, CAPILLARY: Glucose-Capillary: 231 mg/dL — ABNORMAL HIGH (ref 70–99)

## 2021-10-06 MED ORDER — CLOPIDOGREL BISULFATE 75 MG PO TABS
75.0000 mg | ORAL_TABLET | Freq: Every day | ORAL | 2 refills | Status: DC
  Filled 2021-10-06: qty 90, 90d supply, fill #0

## 2021-10-06 MED ORDER — NITROGLYCERIN 0.4 MG SL SUBL
0.4000 mg | SUBLINGUAL_TABLET | SUBLINGUAL | 2 refills | Status: AC | PRN
  Filled 2021-10-06: qty 25, 7d supply, fill #0

## 2021-10-06 MED ORDER — METFORMIN HCL 1000 MG PO TABS: 1000.0000 mg | ORAL_TABLET | Freq: Every day | ORAL | 0 refills | Status: DC

## 2021-10-06 MED FILL — Heparin Sod (Porcine)-NaCl IV Soln 1000 Unit/500ML-0.9%: INTRAVENOUS | Qty: 1000 | Status: AC

## 2021-10-06 MED FILL — Famotidine in NaCl 0.9% IV Soln 20 MG/50ML: INTRAVENOUS | Qty: 100 | Status: AC

## 2021-10-06 MED FILL — Tirofiban HCl in NaCl 0.9% IV Soln 5 MG/100ML (Base Equiv): INTRAVENOUS | Qty: 100 | Status: AC

## 2021-10-06 NOTE — Progress Notes (Signed)
Patient ID: [name] [name], male   DOB: January 01, 1946, 76 y.o.   MRN: [id number] ? ? ?An After Visit Summary was printed and given to the patient. ? ? ?Patient education given on medication changes, diet, activity,site care and post cath instructions. The patient expresses understanding and acceptance of instructions.  ? ?Haydee Salter 10/06/2021 11:01 AM ? ?

## 2021-10-06 NOTE — Discharge Summary (Addendum)
Discharge Summary    Patient ID: [name] [name] MRN: [id number]; DOB: 1945/09/15  Admit date: 10/05/2021 Discharge date: 10/06/2021  PCP:  Gifford Shave, MD   [facility] Providers Cardiologist:  [name], MD   {  Discharge Diagnoses    Principal Problem:   CAD (coronary artery disease) Active Problems:   HYPERCHOLESTEROLEMIA   Essential hypertension   Acute diastolic heart failure ([place])   Diabetes mellitus ([place])   Diagnostic Studies/Procedures    Cath: 10/05/21    Prox RCA lesion is 100% stenosed.  Left to right collaterals.   Ost LAD to Prox LAD lesion is 25% stenosed.   Dist LM lesion is 25% stenosed.   Prox LAD to Mid LAD lesion is 90% stenosed.   A drug-eluting stent was successfully placed using a STENT ONYX FRONTIER 3.5X12, postdilated to 3.75 mm and optimized IVUS.   Post intervention, there is a 0% residual stenosis.   LV end diastolic pressure is mildly elevated.   There is no aortic valve stenosis.   Aortic saturation 93%, PA saturation 76%, PA pressure 34/13, mean PA pressure 21 mmHg, mean pulmonary capillary wedge pressure 18 mmHg, cardiac output 9.5 L/min, cardiac index 3.76.  Mean RA pressure 3 mm Hg.   Continue aggressive secondary prevention.  He will need high-dose lipid-lowering therapy.  Dual antiplatelet therapy with clopidogrel. Watch overnight.  Gentle hydration.  Ejection fraction mildly decreased and EDP was around 20.  Hold ARB.  Plan for discharge in the morning if renal function okay.   Normal PA pressure.     Results discussed with his son by phone.  Diagnostic Dominance: Co-dominant Intervention     _____________   History of Present Illness     [name] [name] is a 76 y.o. male with PMH of HTN, HLD, DM, BPH, OSA on Cpap and carotid artery disease who was referred to Dr. Irish Lack for decline in EF. Echo from 09/2021 showed LVEF of 45-50% with no rWMA, normal RV, moderately dilated LA. Given this finding he was referred  for outpatient cardiac cath.   Hospital Course     Underwent cardiac cath noted above with pRCA 100% stenosis with left to right collaterals, pLAD with 90% stenosis treated with PCI/DES x1. Plan for DAPT with ASA/plavix for at least 6 months. No complications noted post cath. Cr post cath 1.2>>1.4 therefore will plan to continue to hold ARB until follow up appt with repeat labs. Worked well with cardiac rehab without recurrent chest pain.   Patient was seen by Dr. Ellyn Hack and deemed stable for discharge home. Follow up arranged in the office. Medications sent to [place]   Did the patient have an acute coronary syndrome (MI, NSTEMI, STEMI, etc) this admission?:  No                               Did the patient have a percutaneous coronary intervention (stent / angioplasty)?:  Yes.     Cath/PCI Registry Performance & Quality Measures: Aspirin prescribed? - Yes ADP Receptor Inhibitor (Plavix/Clopidogrel, Brilinta/Ticagrelor or Effient/Prasugrel) prescribed (includes medically managed patients)? - Yes High Intensity Statin (Lipitor 40-'80mg'$  or Crestor 20-'40mg'$ ) prescribed? - Yes For EF <40%, was ACEI/ARB prescribed? - Not Applicable (EF >/= 48%) For EF <40%, Aldosterone Antagonist (Spironolactone or Eplerenone) prescribed? - Not Applicable (EF >/= 18%) Cardiac Rehab Phase II ordered? - Yes       The patient will be scheduled for a  TOC follow up appointment in 10-14 days.  A message has been sent to the [facility] and Scheduling Pool at the office where the patient should be seen for follow up.  _____________  Discharge Vitals Blood pressure 127/79, pulse 74, temperature 97.9 F (36.6 C), temperature source Oral, resp. rate 18, height '6\' 1"'$  (1.854 m), weight 136.1 kg, SpO2 98 %.  Filed Weights   10/05/21 1004  Weight: 136.1 kg   General appearance: alert, cooperative, no distress, and morbidly obese Neck: no carotid bruit, no JVD, and supple, symmetrical, trachea midline Lungs: clear to  auscultation bilaterally, normal percussion bilaterally, and nonlabored, good air movement Heart: regular rate and rhythm, S1, S2 normal, no murmur, click, rub or gallop and normal apical impulse Abdomen: soft, non-tender; bowel sounds normal; no masses,  no organomegaly Extremities: extremities normal, atraumatic, no cyanosis or edema and radial access site is clean dry and intact. Pulses: 2+ and symmetric Neurologic: Alert and oriented X 3, normal strength and tone. Normal symmetric reflexes. Normal coordination and gait    Labs & Radiologic Studies    CBC Recent Labs    10/05/21 1331 10/06/21 0241  WBC  --  6.6  HGB 14.3 14.7  HCT 42.0 44.5  MCV  --  86.7  PLT  --  361   Basic Metabolic Panel Recent Labs    10/05/21 1331 10/06/21 0241  NA 141 134*  K 3.8 3.6  CL  --  102  CO2  --  23  GLUCOSE  --  136*  BUN  --  22  CREATININE  --  1.40*  CALCIUM  --  8.3*   Liver Function Tests No results for input(s): AST, ALT, ALKPHOS, BILITOT, PROT, ALBUMIN in the last 72 hours. No results for input(s): LIPASE, AMYLASE in the last 72 hours. High Sensitivity Troponin:   No results for input(s): TROPONINIHS in the last 720 hours.  BNP Invalid input(s): POCBNP D-Dimer No results for input(s): DDIMER in the last 72 hours. Hemoglobin A1C No results for input(s): HGBA1C in the last 72 hours. Fasting Lipid Panel No results for input(s): CHOL, HDL, LDLCALC, TRIG, CHOLHDL, LDLDIRECT in the last 72 hours. Thyroid Function Tests No results for input(s): TSH, T4TOTAL, T3FREE, THYROIDAB in the last 72 hours.  Invalid input(s): FREET3 _____________  CARDIAC CATHETERIZATION  Result Date: 10/05/2021   Prox RCA lesion is 100% stenosed.  Left to right collaterals.   Ost LAD to Prox LAD lesion is 25% stenosed.   Dist LM lesion is 25% stenosed.   Prox LAD to Mid LAD lesion is 90% stenosed.   A drug-eluting stent was successfully placed using a STENT ONYX FRONTIER 3.5X12, postdilated to 3.75  mm and optimized IVUS.   Post intervention, there is a 0% residual stenosis.   LV end diastolic pressure is mildly elevated.   There is no aortic valve stenosis.   Aortic saturation 93%, PA saturation 76%, PA pressure 34/13, mean PA pressure 21 mmHg, mean pulmonary capillary wedge pressure 18 mmHg, cardiac output 9.5 L/min, cardiac index 3.76.  Mean RA pressure 3 mm Hg. Continue aggressive secondary prevention.  He will need high-dose lipid-lowering therapy.  Dual antiplatelet therapy with clopidogrel.  Watch overnight.  Gentle hydration.  Ejection fraction mildly decreased and EDP was around 20.  Hold ARB.  Plan for discharge in the morning if renal function okay. Normal PA pressure.  Results discussed with his son by phone.   ECHOCARDIOGRAM COMPLETE  Result Date: 09/14/2021  ECHOCARDIOGRAM REPORT   Patient Name:   [name] [name] Date of Exam: 09/14/2021 Medical Rec #:  [id number]          Height:       73.0 in Accession #:    [id number]         Weight:       316.8 lb Date of Birth:  Sep 09, 1945           BSA:          2.616 m Patient Age:    41 years           BP:           135/79 mmHg Patient Gender: M                  HR:           67 bpm. Exam Location:  Outpatient Procedure: 2D Echo, Cardiac Doppler, Color Doppler and Strain Analysis Indications:    CHF  History:        Patient has no prior history of Echocardiogram examinations.                 CHF, Arrythmias:Bradycardia; Risk Factors:Sleep Apnea,                 Hypertension and Diabetes.  Sonographer:    Jyl Heinz Referring Phys: [place]  1. Abnormal septal motion Inferior basal hypokinesis . Left ventricular ejection fraction, by estimation, is 45 to 50%. The left ventricle has mildly decreased function. The left ventricle has no regional wall motion abnormalities. The left ventricular internal cavity size was mildly dilated. There is mild left ventricular hypertrophy. Left ventricular diastolic parameters were normal.  The average left ventricular global longitudinal strain is -17.3 %. The global longitudinal strain is normal.  2. Right ventricular systolic function is normal. The right ventricular size is normal.  3. Left atrial size was moderately dilated.  4. Cannot r/o PFO.  5. The mitral valve is normal in structure. Mild mitral valve regurgitation. No evidence of mitral stenosis.  6. The aortic valve is tricuspid. There is mild calcification of the aortic valve. Aortic valve regurgitation is mild. Aortic valve sclerosis is present, with no evidence of aortic valve stenosis.  7. The inferior vena cava is normal in size with greater than 50% respiratory variability, suggesting right atrial pressure of 3 mmHg. FINDINGS  Left Ventricle: Abnormal septal motion Inferior basal hypokinesis. Left ventricular ejection fraction, by estimation, is 45 to 50%. The left ventricle has mildly decreased function. The left ventricle has no regional wall motion abnormalities. The average left ventricular global longitudinal strain is -17.3 %. The global longitudinal strain is normal. The left ventricular internal cavity size was mildly dilated. There is mild left ventricular hypertrophy. Left ventricular diastolic parameters were  normal. Right Ventricle: The right ventricular size is normal. No increase in right ventricular wall thickness. Right ventricular systolic function is normal. Left Atrium: Left atrial size was moderately dilated. Right Atrium: Right atrial size was normal in size. Pericardium: There is no evidence of pericardial effusion. Mitral Valve: The mitral valve is normal in structure. Mild mitral valve regurgitation. No evidence of mitral valve stenosis. Tricuspid Valve: The tricuspid valve is normal in structure. Tricuspid valve regurgitation is mild . No evidence of tricuspid stenosis. Aortic Valve: The aortic valve is tricuspid. There is mild calcification of the aortic valve. Aortic valve regurgitation is mild. Aortic  valve sclerosis is present, with  no evidence of aortic valve stenosis. Aortic valve peak gradient measures 7.5 mmHg. Pulmonic Valve: The pulmonic valve was normal in structure. Pulmonic valve regurgitation is trivial. No evidence of pulmonic stenosis. Aorta: The aortic root is normal in size and structure. Venous: The inferior vena cava is normal in size with greater than 50% respiratory variability, suggesting right atrial pressure of 3 mmHg. IAS/Shunts: The interatrial septum was not well visualized.  LEFT VENTRICLE PLAX 2D LVIDd:         5.70 cm      Diastology LVIDs:         4.10 cm      LV e' medial:    7.18 cm/s LV PW:         1.00 cm      LV E/e' medial:  8.0 LV IVS:        1.30 cm      LV e' lateral:   6.31 cm/s LVOT diam:     2.00 cm      LV E/e' lateral: 9.1 LV SV:         58 LV SV Index:   22           2D Longitudinal Strain LVOT Area:     3.14 cm     2D Strain GLS Avg:     -17.3 %  LV Volumes (MOD) LV vol d, MOD A2C: 179.0 ml 3D Volume EF: LV vol d, MOD A4C: 164.0 ml 3D EF:        54 % LV vol s, MOD A2C: 109.0 ml LV EDV:       215 ml LV vol s, MOD A4C: 103.0 ml LV ESV:       99 ml LV SV MOD A2C:     70.0 ml  LV SV:        116 ml LV SV MOD A4C:     164.0 ml LV SV MOD BP:      66.8 ml RIGHT VENTRICLE            IVC RV Basal diam:  3.20 cm    IVC diam: 1.90 cm RV Mid diam:    2.50 cm RV S prime:     8.05 cm/s TAPSE (M-mode): 2.2 cm LEFT ATRIUM             Index        RIGHT ATRIUM           Index LA diam:        4.50 cm 1.72 cm/m   RA Area:     21.00 cm LA Vol (A2C):   64.4 ml 24.62 ml/m  RA Volume:   57.40 ml  21.94 ml/m LA Vol (A4C):   75.6 ml 28.90 ml/m LA Biplane Vol: 71.3 ml 27.25 ml/m  AORTIC VALVE AV Area (Vmax): 1.92 cm AV Vmax:        137.00 cm/s AV Peak Grad:   7.5 mmHg LVOT Vmax:      83.80 cm/s LVOT Vmean:     60.000 cm/s LVOT VTI:       0.185 m  AORTA Ao Root diam: 3.40 cm Ao Asc diam:  3.60 cm MITRAL VALVE               TRICUSPID VALVE MV Area (PHT): 3.81 cm    TR Peak grad:   13.0  mmHg MV Decel Time: 199 msec    TR Vmax:        180.00  cm/s MV E velocity: 57.60 cm/s MV A velocity: 91.20 cm/s  SHUNTS MV E/A ratio:  0.63        Systemic VTI:  0.18 m                            Systemic Diam: 2.00 cm Jenkins Rouge MD Electronically signed by Jenkins Rouge MD Signature Date/Time: 09/14/2021/11:34:35 AM    Final    Disposition   Pt is being discharged home today in good condition.  Follow-up Plans & Appointments     Follow-up Information     [place] [facility] Follow up on 10/13/2021.   Specialty: Cardiology Why: at 2:15pm with [name], Dr. Morton Amy' PA Contact information: [address] [place] [phone]               Discharge Instructions     AMB Referral to Cardiac Rehabilitation - Phase II   Complete by: As directed    Diagnosis: Coronary Stents   After initial evaluation and assessments completed: Virtual Based Care may be provided alone or in conjunction with Phase 2 Cardiac Rehab based on patient barriers.: Yes   Diet - low sodium heart healthy   Complete by: As directed    Discharge instructions   Complete by: As directed    Radial Site Care Refer to this sheet in the next few weeks. These instructions provide you with information on caring for yourself after your procedure. Your caregiver may also give you more specific instructions. Your treatment has been planned according to current medical practices, but problems sometimes occur. Call your caregiver if you have any problems or questions after your procedure. HOME CARE INSTRUCTIONS You may shower the day after the procedure. Remove the bandage (dressing) and gently wash the site with plain soap and water. Gently pat the site dry.  Do not apply powder or lotion to the site.  Do not submerge the affected site in water for 3 to 5 days.  Inspect the site at least twice daily.  Do not flex or bend the affected arm for 24 hours.  No lifting  over 5 pounds (2.3 kg) for 5 days after your procedure.  Do not drive home if you are discharged the same day of the procedure. Have someone else drive you.  You may drive 24 hours after the procedure unless otherwise instructed by your caregiver.  What to expect: Any bruising will usually fade within 1 to 2 weeks.  Blood that collects in the tissue (hematoma) may be painful to the touch. It should usually decrease in size and tenderness within 1 to 2 weeks.  SEEK IMMEDIATE MEDICAL CARE IF: You have unusual pain at the radial site.  You have redness, warmth, swelling, or pain at the radial site.  You have drainage (other than a small amount of blood on the dressing).  You have chills.  You have a fever or persistent symptoms for more than 72 hours.  You have a fever and your symptoms suddenly get worse.  Your arm becomes pale, cool, tingly, or numb.  You have heavy bleeding from the site. Hold pressure on the site.   PLEASE DO NOT MISS ANY DOSES OF YOUR PLAVIX!!!!! Also keep a log of you blood pressures and bring back to your follow up appt. Please call the office with any questions.   Patients taking blood thinners should generally stay away from  medicines like ibuprofen, Advil, Motrin, naproxen, and Aleve due to risk of stomach bleeding. You may take Tylenol as directed or talk to your primary doctor about alternatives.  Some studies suggest Prilosec/Omeprazole interacts with Plavix. We changed your Prilosec/Omeprazole to the equivalent dose of Protonix for less chance of interaction.  PLEASE ENSURE THAT YOU DO NOT RUN OUT OF YOUR PLAVIX. This medication is very important to remain on for at least 6 months. IF you have issues obtaining this medication due to cost please CALL the office 3-5 business days prior to running out in order to prevent missing doses of this medication.   Increase activity slowly   Complete by: As directed        Discharge Medications   Allergies as of  10/06/2021       Reactions   Lisinopril Cough   Lodine [etodolac] Nausea And Vomiting, Other (See Comments)   Internal Bleeding.    Aleve [naproxen] Other (See Comments)   "jittery, extreme"        Medication List     STOP taking these medications    losartan 50 MG tablet Commonly known as: COZAAR       TAKE these medications    Accu-Chek Aviva Plus test strip Generic drug: glucose blood 1 each by Other route 2 (two) times daily. Use to check blood sugar 2x per day. Dx Code: e11.9   accu-chek softclix lancets Use to test twice daily   aspirin EC 81 MG tablet Take 1 tablet (81 mg total) by mouth daily. Swallow whole.   buPROPion 200 MG 12 hr tablet Commonly known as: Wellbutrin SR Take 1 tablet (200 mg total) by mouth every morning.   clopidogrel 75 MG tablet Commonly known as: PLAVIX Take 1 tablet (75 mg total) by mouth daily with breakfast.   doxycycline 50 MG capsule Commonly known as: VIBRAMYCIN Take 50 mg by mouth daily.   erythromycin ophthalmic ointment Place 1 application into both eyes at bedtime.   escitalopram 20 MG tablet Commonly known as: Lexapro Take 1 tablet (20 mg total) by mouth daily.   furosemide 40 MG tablet Commonly known as: LASIX Take 1 tablet (40 mg total) by mouth daily.   Gemtesa 75 MG Tabs Generic drug: Vibegron Take 75 mg by mouth daily.   Mens 50+ Multi Vitamin/Min Tabs Take 1 tablet by mouth daily.   metFORMIN 1000 MG tablet Commonly known as: GLUCOPHAGE Take 1 tablet (1,000 mg total) by mouth daily with breakfast.   metoprolol succinate 25 MG 24 hr tablet Commonly known as: TOPROL-XL Take 0.5 tablets (12.5 mg total) by mouth daily.   nitroGLYCERIN 0.4 MG SL tablet Commonly known as: Nitrostat Place 1 tablet (0.4 mg total) under the tongue every 5 (five) minutes as needed.   rosuvastatin 40 MG tablet Commonly known as: CRESTOR Take 1 tablet by mouth daily   solifenacin 5 MG tablet Commonly known as:  VESICARE Take 1 tablet (5 mg total) by mouth daily.   tirzepatide 12.5 MG/0.5ML Pen Commonly known as: MOUNJARO Inject 12.5 mg into the skin once a week.   TURMERIC PO Take 1 tablet by mouth daily.   Vitamin D (Ergocalciferol) 1.25 MG (50000 UNIT) Caps capsule Commonly known as: DRISDOL Take 1 capsule (50,000 Units total) by mouth every 14 (fourteen) days.        Outstanding Labs/Studies   BMET at follow up appt  Duration of Discharge Encounter   Greater than 30 minutes including physician time.  Signed, Ria Comment  Mancel Bale, NP 10/06/2021, 9:38 AM  I personally saw and evaluated the patient this morning on rounds.  Physical examination performed performed by me.  Doing well status post PCI.  Renal function stable.  Okay for discharge.  He will follow-up with Dr. Irish Lack oriented as scheduled.  He will need to get IV [place] checked to follow-up his renal function prior to follow-up visit.   Glenetta Hew, MD

## 2021-10-06 NOTE — Plan of Care (Signed)
Patient ID: RETT STEHLIK, male   DOB: 01/26/1946, 76 y.o.   MRN: [id number] ? ?Problem: Health Behavior/Discharge Planning: ?Goal: Ability to manage health-related needs will improve ?Outcome: Adequate for Discharge ?  ?Problem: Clinical Measurements: ?Goal: Ability to maintain clinical measurements within normal limits will improve ?Outcome: Adequate for Discharge ?Goal: Will remain free from infection ?Outcome: Adequate for Discharge ?Goal: Diagnostic test results will improve ?Outcome: Adequate for Discharge ?Goal: Respiratory complications will improve ?Outcome: Adequate for Discharge ?Goal: Cardiovascular complication will be avoided ?Outcome: Adequate for Discharge ?  ?Problem: Activity: ?Goal: Risk for activity intolerance will decrease ?Outcome: Adequate for Discharge ?  ?Problem: Nutrition: ?Goal: Adequate nutrition will be maintained ?Outcome: Adequate for Discharge ?  ?Problem: Coping: ?Goal: Level of anxiety will decrease ?Outcome: Adequate for Discharge ?  ?Problem: Elimination: ?Goal: Will not experience complications related to bowel motility ?Outcome: Adequate for Discharge ?Goal: Will not experience complications related to urinary retention ?Outcome: Adequate for Discharge ?  ?Problem: Pain Managment: ?Goal: General experience of comfort will improve ?Outcome: Adequate for Discharge ?  ?Problem: Safety: ?Goal: Ability to remain free from injury will improve ?Outcome: Adequate for Discharge ?  ?Problem: Skin Integrity: ?Goal: Risk for impaired skin integrity will decrease ?Outcome: Adequate for Discharge ?  ? ?Haydee Salter, RN ? ?

## 2021-10-06 NOTE — Plan of Care (Signed)
?  Problem: Education: ?Goal: Knowledge of General Education information will improve ?Description: Including pain rating scale, medication(s)/side effects and non-pharmacologic comfort measures ?Outcome: Completed/Met ?  ?

## 2021-10-06 NOTE — Telephone Encounter (Signed)
**Note De-identified Ceceilia Cephus Obfuscation** -----  **Note De-Identified Jeily Guthridge Obfuscation** Message from Cheryln Manly, NP sent at 10/06/2021  9:38 AM EST ----- ?Regarding: TOC call ?Needs TOC call please ? ?

## 2021-10-06 NOTE — TOC Benefit Eligibility Note (Signed)
Patient Advocate Encounter ?  ?Insurance verification completed.   ?  ?The patient is currently admitted and upon discharge could be taking JARDIANCE. ?  ?The current 30 day co-pay is, $312. ($47 after deductible)  ? ?The patient is currently admitted and upon discharge could be taking FARXIGA. ($99 after deductible) ?  ?The current 30 day co-pay is, $364.  ? ?PT DOES HAVE AN UNMET DEDUCTIBLE OF $265  ? ? ?The patient is insured through [employer]. ? ? ?  ? ?

## 2021-10-06 NOTE — Progress Notes (Signed)
CARDIAC REHAB PHASE I  ? ?Offered to walk with pt. Pt ambulated in hallway independently without difficulty. Pt educated on importance of ASA and Plavix. Pt given heart healthy and diabetic diets. Reviewed site care, restrictions, and exercise guidelines. Will refer to CRP II [place].  ? ?[phone] ?Rufina Falco, RN BSN ?10/06/2021 ?10:37 AM ? ?

## 2021-10-07 NOTE — Telephone Encounter (Signed)
Patient contacted regarding discharge from [facility] on 10/06/21. ? ?Patient understands to follow up with provider [name] PA-C on 10/13/21 at 2:15 PM at [facility] location. ?Patient understands discharge instructions? yes ?Patient understands medications and regiment? yes ?Patient understands to bring all medications to this visit? yes ? ?Ask patient:  Are you enrolled in My Chart (yes or no)  If no ask patient if they would like to enroll. -pt already enrolled in [place].  ? ? ?

## 2021-10-09 ENCOUNTER — Telehealth: Payer: Self-pay | Admitting: Medical

## 2021-10-09 ENCOUNTER — Other Ambulatory Visit: Payer: Self-pay | Admitting: Family Medicine

## 2021-10-09 DIAGNOSIS — I10 Essential (primary) hypertension: Secondary | ICD-10-CM

## 2021-10-09 DIAGNOSIS — N3941 Urge incontinence: Secondary | ICD-10-CM

## 2021-10-09 DIAGNOSIS — E1169 Type 2 diabetes mellitus with other specified complication: Secondary | ICD-10-CM

## 2021-10-09 MED ORDER — FUROSEMIDE 40 MG PO TABS: 40.0000 mg | ORAL_TABLET | Freq: Every day | ORAL | 3 refills | Status: DC

## 2021-10-09 MED ORDER — METFORMIN HCL 1000 MG PO TABS: 1000.0000 mg | ORAL_TABLET | Freq: Every day | ORAL | 3 refills | Status: DC

## 2021-10-09 NOTE — Telephone Encounter (Signed)
? ?  Patient called the after hours line requesting medication refills. He reports being out of "all" of his medications, however does reports he has been taking his plavix and aspirin without missed doses since his cardiac cath with stent placement 10/06/21. He feels quite anxious off his lexapro and wellbutrin for which he was encouraged to reach his PCP. I have refilled his metformin and lasix, previously prescribed by Dr. Irish Lack. Clarified that he was recommended to stay off his losartan until his follow-up visit with cardiology, scheduled for 10/13/21. He was appreciative of the call. ? ?Abigail Butts, PA-C ?10/09/21; 1:09 PM ? ?

## 2021-10-12 ENCOUNTER — Ambulatory Visit (HOSPITAL_COMMUNITY): Admission: RE | Admit: 2021-10-12 | Payer: Medicare PPO | Source: Home / Self Care | Admitting: Urology

## 2021-10-12 ENCOUNTER — Encounter (HOSPITAL_COMMUNITY): Admission: RE | Payer: Self-pay | Source: Home / Self Care

## 2021-10-12 SURGERY — CREATION, URETHRAL SLING, MALE
Anesthesia: General
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
## 2021-10-13 ENCOUNTER — Encounter: Payer: Self-pay | Admitting: Physician Assistant

## 2021-10-13 ENCOUNTER — Ambulatory Visit: Payer: Medicare PPO | Admitting: Physician Assistant

## 2021-10-13 ENCOUNTER — Other Ambulatory Visit: Payer: Self-pay

## 2021-10-13 VITALS — BP 130/84 | HR 95 | Ht 73.0 in | Wt 318.2 lb

## 2021-10-13 DIAGNOSIS — I25110 Atherosclerotic heart disease of native coronary artery with unstable angina pectoris: Secondary | ICD-10-CM

## 2021-10-13 DIAGNOSIS — E1159 Type 2 diabetes mellitus with other circulatory complications: Secondary | ICD-10-CM

## 2021-10-13 DIAGNOSIS — I4510 Unspecified right bundle-branch block: Secondary | ICD-10-CM

## 2021-10-13 DIAGNOSIS — I255 Ischemic cardiomyopathy: Secondary | ICD-10-CM

## 2021-10-13 DIAGNOSIS — I10 Essential (primary) hypertension: Secondary | ICD-10-CM | POA: Diagnosis not present

## 2021-10-13 DIAGNOSIS — I700 Atherosclerosis of aorta: Secondary | ICD-10-CM | POA: Diagnosis not present

## 2021-10-13 MED ORDER — LOSARTAN POTASSIUM 25 MG PO TABS: 25.0000 mg | ORAL_TABLET | Freq: Every day | ORAL | 3 refills | Status: DC

## 2021-10-13 NOTE — Patient Instructions (Signed)
Medication Instructions:  ?1.Restart all of your medications as you were taking them except for the losartan (Cozaar), we are decreasing the dose to 25 mg daily. ?*If you need a refill on your cardiac medications before your next appointment, please call your pharmacy* ? ? ?Lab Work: ?None ?If you have labs (blood work) drawn today and your tests are completely normal, you will receive your results only by: ?MyChart Message (if you have MyChart) OR ?A paper copy in the mail ?If you have any lab test that is abnormal or we need to change your treatment, we will call you to review the results. ? ? ?Testing/Procedures: ?Your physician has requested that you have an echocardiogram. Echocardiography is a painless test that uses sound waves to create images of your heart. It provides your doctor with information about the size and shape of your heart and how well your heart?s chambers and valves are working. This procedure takes approximately one hour. There are no restrictions for this procedure.  ? ? ?Follow-Up: ?At [facility], you and your health needs are our priority.  As part of our continuing mission to provide you with exceptional heart care, we have created designated Provider Care Teams.  These Care Teams include your primary Cardiologist (physician) and Advanced Practice Providers (APPs -  Physician Assistants and Nurse Practitioners) who all work together to provide you with the care you need, when you need it. ? ?Your next appointment:   ?3 month(s) ? ?The format for your next appointment:   ?In Person ? ?Provider:   ?[name], MD or APP ? ?Other Instructions ?1.You may start outpatient cardiac rehab ?2. Check your blood pressure 1 hour after you take your medications and send [name] the readings through your MyChart or call us ?
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
## 2021-10-15 ENCOUNTER — Ambulatory Visit (HOSPITAL_COMMUNITY)
Admission: RE | Admit: 2021-10-15 | Discharge: 2021-10-15 | Disposition: A | Discharge status: Home / Self Care | Payer: Medicare PPO | Source: Ambulatory Visit | Attending: Vascular Surgery | Admitting: Vascular Surgery

## 2021-10-15 ENCOUNTER — Ambulatory Visit: Payer: Medicare PPO | Admitting: Vascular Surgery

## 2021-10-15 ENCOUNTER — Other Ambulatory Visit: Payer: Self-pay

## 2021-10-15 VITALS — BP 133/78 | HR 66 | Temp 98.3°F | Resp 20 | Ht 73.0 in | Wt 316.7 lb

## 2021-10-15 DIAGNOSIS — I6523 Occlusion and stenosis of bilateral carotid arteries: Secondary | ICD-10-CM

## 2021-10-15 DIAGNOSIS — I6522 Occlusion and stenosis of left carotid artery: Secondary | ICD-10-CM | POA: Insufficient documentation

## 2021-10-19 ENCOUNTER — Other Ambulatory Visit: Payer: Self-pay

## 2021-10-19 ENCOUNTER — Ambulatory Visit (INDEPENDENT_AMBULATORY_CARE_PROVIDER_SITE_OTHER): Payer: Medicare PPO | Admitting: Family Medicine

## 2021-10-19 ENCOUNTER — Encounter (INDEPENDENT_AMBULATORY_CARE_PROVIDER_SITE_OTHER): Payer: Self-pay | Admitting: Family Medicine

## 2021-10-19 VITALS — BP 146/72 | HR 75 | Temp 98.1°F | Ht 73.0 in | Wt 312.0 lb

## 2021-10-19 DIAGNOSIS — E1169 Type 2 diabetes mellitus with other specified complication: Secondary | ICD-10-CM

## 2021-10-19 DIAGNOSIS — I25110 Atherosclerotic heart disease of native coronary artery with unstable angina pectoris: Secondary | ICD-10-CM

## 2021-10-19 DIAGNOSIS — Z6841 Body Mass Index (BMI) 40.0 and over, adult: Secondary | ICD-10-CM | POA: Diagnosis not present

## 2021-10-19 DIAGNOSIS — E669 Obesity, unspecified: Secondary | ICD-10-CM

## 2021-10-20 ENCOUNTER — Other Ambulatory Visit: Payer: Self-pay | Admitting: Family Medicine

## 2021-10-20 DIAGNOSIS — F3289 Other specified depressive episodes: Secondary | ICD-10-CM

## 2021-10-21 ENCOUNTER — Other Ambulatory Visit: Payer: Self-pay | Admitting: Urology

## 2021-10-21 ENCOUNTER — Telehealth: Payer: Self-pay | Admitting: Interventional Cardiology

## 2021-10-21 ENCOUNTER — Other Ambulatory Visit: Payer: Self-pay | Admitting: *Deleted

## 2021-10-21 DIAGNOSIS — F3289 Other specified depressive episodes: Secondary | ICD-10-CM

## 2021-10-21 MED ORDER — BUPROPION HCL ER (SR) 200 MG PO TB12: 200.0000 mg | ORAL_TABLET | Freq: Every morning | ORAL | 3 refills | Status: DC

## 2021-10-21 NOTE — Telephone Encounter (Signed)
Mr. [name] [name] underwent cardiac catheterization with PCI and DES on 10/05/2021.  He will not be eligible to hold his aspirin or Plavix until 10/06/2022.  He will also need follow-up appointment postcardiac catheterization.  Please add assessment for preoperative cardiac evaluation to his follow-up note.  Thank you. ? ?Jossie Ng. Laiken Sandy NP-C ? ?  ?10/21/2021, 3:03 PM ?[place] ?[address] ?Office [phone] Fax [phone] ? ?

## 2021-10-21 NOTE — Telephone Encounter (Signed)
See notes from pre op provider Coletta Memos, FNP in regard to Plavix.  ?

## 2021-10-21 NOTE — Telephone Encounter (Signed)
? ?  Pre-operative Risk Assessment  ?  ?Patient Name: [name] [name]  ?DOB: 04-20-1946 ?MRN: [id number]  ? ?  ? ?Request for Surgical Clearance   ? ?Procedure:   Male Sling  ? ?Date of Surgery:  11-23-21 ? ?Clearance       ?                         ?   ?Surgeon:  Dr Matilde Sprang ?Surgeon's Group or Practice Name:   ?Phone number:  [phone] ?Fax number:  [phone] ?  ?Type of Clearance Requested:  Medical and Medicine( Aspirin) ? ?  ?Type of Anesthesia:  General  ?  ?Additional requests/questions:   ? ?Signed, ?Glyn Ade   ?10/21/2021, 2:29 PM  ? ?

## 2021-10-21 NOTE — Telephone Encounter (Signed)
Pt has had s/p cath f/u with [name], [place] on 10/13/21. Pt has appt with Richardson Dopp, [facility] 12/2021 ?

## 2021-10-22 NOTE — Progress Notes (Signed)
? ? ? ?Chief Complaint:  ? ?OBESITY ?[name] [name] is here to discuss his progress with his obesity treatment plan along with follow-up of his obesity related diagnoses. [name] [name] is on the Category 4 Plan and states he is following his eating plan approximately 85% of the time. [name] [name] states he is doing 0 minutes 0 times per week. ? ?Today's visit was #: 83 ?Starting weight: 340 lbs ?Starting date: 12/28/2016 ?Today's weight: 312 lbs ?Today's date: 10/19/2021 ?Total lbs lost to date: 70 ?Total lbs lost since last in-office visit: 0 ? ?Interim History: [name] [name] has had a lot of health issues since his last visit. He hasn't been able to concentrate on his diet as much, but he is ready now to get back to diet and exercise.  ? ?Subjective:  ? ?1. Coronary artery disease involving native coronary artery of native heart with unstable angina pectoris ([name] [name]) ?[name] [name] was recently diagnosed with 100% PL RCA blockage with collaterals and 90% LAD stenosis now status post PCI. He is feeling well with some improvement in fatigue, but he was  relatively asymptomatic.  ? ?2. Type 2 diabetes mellitus with other specified complication, without long-term current use of insulin ([name] [name]) ?[name] [name] was prescribed [name] [name], but his insurance wouldn't cover this. He is working on finding out what GLP-1 they will cover. ? ?Assessment/Plan:  ? ?1. Coronary artery disease involving native coronary artery of native heart with unstable angina pectoris ([name] [name]) ?[name] [name] will continue to work on his diet, lowering cholesterol, lowering sodium, and weight loss to decrease the risk of further cardiac complications. ? ?2. Type 2 diabetes mellitus with other specified complication, without long-term current use of insulin ([name] [name]) ?[name] [name] is to contact me and let me know which GLP-1 his insurance will cover, and we will write for that medications then. ? ?3. Obesity with current BMI of 41.2 ?[name] [name] is currently in the action stage of change. As such, his goal is to continue  with weight loss efforts. He has agreed to change to the Category 3 Plan.  ? ?Exercise goals: Walking for 10 minutes 2 times per day and 10 minute exercise videos.  ? ?Behavioral modification strategies: increasing lean protein intake. ? ?[name] [name] has agreed to follow-up with our clinic in 3 weeks. He was informed of the importance of frequent follow-up visits to maximize his success with intensive lifestyle modifications for his multiple health conditions.  ? ?Objective:  ? ?Blood pressure (!) 146/72, pulse 75, temperature 98.1 ?F (36.7 ?C), height '6\' 1"'$  (1.854 m), weight (!) 312 lb (141.5 kg), SpO2 98 %. ?Body mass index is 41.16 kg/m?. ? ?General: Cooperative, alert, well developed, in no acute distress. ?HEENT: Conjunctivae and lids unremarkable. ?Cardiovascular: Regular rhythm.  ?Lungs: Normal work of breathing. ?Neurologic: No focal deficits.  ? ?Lab Results  ?Component Value Date  ? CREATININE 1.40 (H) 10/06/2021  ? BUN 22 10/06/2021  ? NA 134 (L) 10/06/2021  ? K 3.6 10/06/2021  ? CL 102 10/06/2021  ? CO2 23 10/06/2021  ? ?Lab Results  ?Component Value Date  ? ALT 31 02/22/2021  ? AST 20 02/22/2021  ? ALKPHOS 63 02/22/2021  ? BILITOT 0.5 02/22/2021  ? ?Lab Results  ?Component Value Date  ? HGBA1C 6.4 09/14/2021  ? HGBA1C 6.3 (H) 02/22/2021  ? HGBA1C 6.6 (H) 09/16/2020  ? HGBA1C 6.1 11/28/2019  ? HGBA1C 6.0 (H) 08/14/2019  ? ?Lab Results  ?Component Value Date  ? INSULIN 11.7 02/22/2021  ? INSULIN 9.1 09/16/2020  ? INSULIN 10.0 08/14/2019  ?  INSULIN 12.4 03/25/2019  ? INSULIN 19.2 09/24/2018  ? ?Lab Results  ?Component Value Date  ? TSH 1.500 03/25/2019  ? ?Lab Results  ?Component Value Date  ? CHOL 143 02/22/2021  ? HDL 39 (L) 02/22/2021  ? [place] 85 02/22/2021  ? LDLDIRECT 148 (H) 11/15/2007  ? TRIG 100 02/22/2021  ? CHOLHDL 5.2 (H) 01/26/2016  ? ?Lab Results  ?Component Value Date  ? VD25OH 70.4 02/22/2021  ? VD25OH 52.1 09/16/2020  ? VD25OH 58.3 02/27/2020  ? ?Lab Results  ?Component Value Date  ? WBC 6.6  10/06/2021  ? HGB 14.7 10/06/2021  ? HCT 44.5 10/06/2021  ? MCV 86.7 10/06/2021  ? PLT 235 10/06/2021  ? ?No results found for: IRON, TIBC, FERRITIN ? ?Attestation Statements:  ? ?Reviewed by clinician on day of visit: allergies, medications, problem list, medical history, surgical history, family history, social history, and previous encounter notes. ? ?Time spent on visit including pre-visit chart review and post-visit care and charting was 32 minutes.  ? ? ?I, Trixie Dredge, am acting as transcriptionist for [name], MD. ? ?I have reviewed the above documentation for accuracy and completeness, and I agree with the above. -  [name], MD ? ? ?

## 2021-10-26 ENCOUNTER — Ambulatory Visit (HOSPITAL_COMMUNITY): Payer: Medicare PPO | Attending: Internal Medicine

## 2021-10-26 ENCOUNTER — Encounter (HOSPITAL_COMMUNITY): Payer: Self-pay | Admitting: Physician Assistant

## 2021-10-26 ENCOUNTER — Encounter (HOSPITAL_COMMUNITY): Payer: Self-pay

## 2021-10-27 ENCOUNTER — Other Ambulatory Visit (HOSPITAL_COMMUNITY): Payer: Self-pay

## 2021-11-08 ENCOUNTER — Ambulatory Visit (INDEPENDENT_AMBULATORY_CARE_PROVIDER_SITE_OTHER): Payer: Medicare PPO | Admitting: Family Medicine

## 2021-11-11 ENCOUNTER — Encounter (HOSPITAL_COMMUNITY): Payer: Medicare PPO

## 2021-11-11 ENCOUNTER — Encounter (INDEPENDENT_AMBULATORY_CARE_PROVIDER_SITE_OTHER): Payer: Self-pay | Admitting: Family Medicine

## 2021-11-12 ENCOUNTER — Other Ambulatory Visit: Payer: Self-pay

## 2021-11-12 DIAGNOSIS — I5030 Unspecified diastolic (congestive) heart failure: Secondary | ICD-10-CM

## 2021-11-12 DIAGNOSIS — I255 Ischemic cardiomyopathy: Secondary | ICD-10-CM

## 2021-11-12 NOTE — Progress Notes (Signed)
[name] Dubonnet, NP  Brooks, Allyson R; [place] Triage ?PCI 10/05/21 with reduced LVEF. Repeat echo should be scheduled for 3 months from intervention (01/05/22 or later). Would be helpful to have completed prior to visit with [name] 01/11/22.  ? ?Echo ordered to reassess LVEF - okay to use diagnosis code ischemic cardiomyopathy, congestive heart failure.  ? ?Please cancel April echo and reschedule for June with updated diagnosis. Anticipate this will be approved.  ? ?[name] Dubonnet, NP  ?

## 2021-11-13 ENCOUNTER — Telehealth: Payer: Self-pay | Admitting: Student

## 2021-11-13 NOTE — Telephone Encounter (Signed)
? ?  Patient called Answering Service.  Called and spoke with patient and he states he was diagnosed with COVID 4 days ago while in [place].  He drove himself home and got home yesterday.  He sounds very congested on the phone with intermittent cough.  He states while he was unloading his car yesterday, he did get very short of breath and had some mild chest tightness that resolved quickly with rest. He states he had a couple other episodes of this mild chest tightness with exertion yesterday.  He is currently chest pain-free.  No chest pain at rest.  It sounds like this is really due to his shortness of breath.  He denies any shortness of breath at rest and does not describe any orthopnea or PND.  I directed him to the urgent virtual care option on the [facility] website to discuss treatment of his COVID.  However, advised him that he if he has any worsening symptoms (worsening shortness of breath or recurrent worsening chest pain) that he should go to the ED for evaluation.  Patient voiced understanding and agreed and thinking for calling. ? ?Darreld Mclean, PA-C ?11/13/2021 10:27 AM ? ? ?

## 2021-11-15 ENCOUNTER — Encounter (HOSPITAL_COMMUNITY): Payer: Medicare PPO

## 2021-11-15 ENCOUNTER — Ambulatory Visit (INDEPENDENT_AMBULATORY_CARE_PROVIDER_SITE_OTHER): Payer: Medicare PPO | Admitting: Family Medicine

## 2021-11-16 ENCOUNTER — Other Ambulatory Visit: Payer: Self-pay

## 2021-11-16 ENCOUNTER — Other Ambulatory Visit (HOSPITAL_COMMUNITY): Payer: Medicare PPO

## 2021-11-16 ENCOUNTER — Encounter (HOSPITAL_COMMUNITY): Payer: Self-pay

## 2021-11-16 DIAGNOSIS — F3289 Other specified depressive episodes: Secondary | ICD-10-CM

## 2021-11-16 DIAGNOSIS — E7849 Other hyperlipidemia: Secondary | ICD-10-CM

## 2021-11-16 MED ORDER — ESCITALOPRAM OXALATE 20 MG PO TABS: 20.0000 mg | ORAL_TABLET | Freq: Every day | ORAL | 0 refills | Status: DC

## 2021-11-16 MED ORDER — ROSUVASTATIN CALCIUM 40 MG PO TABS
ORAL_TABLET | ORAL | 0 refills | Status: DC
Start: 2021-11-16 — End: 2022-01-03

## 2021-11-23 ENCOUNTER — Ambulatory Visit: Admit: 2021-11-23 | Payer: Medicare PPO | Admitting: Urology

## 2021-11-23 SURGERY — CREATION, URETHRAL SLING, MALE
Anesthesia: General

## 2021-11-25 ENCOUNTER — Encounter: Payer: Self-pay | Admitting: Family Medicine

## 2021-12-06 DIAGNOSIS — Z961 Presence of intraocular lens: Secondary | ICD-10-CM | POA: Diagnosis not present

## 2021-12-06 DIAGNOSIS — H43811 Vitreous degeneration, right eye: Secondary | ICD-10-CM | POA: Diagnosis not present

## 2021-12-06 DIAGNOSIS — H0102A Squamous blepharitis right eye, upper and lower eyelids: Secondary | ICD-10-CM | POA: Diagnosis not present

## 2021-12-06 DIAGNOSIS — E119 Type 2 diabetes mellitus without complications: Secondary | ICD-10-CM | POA: Diagnosis not present

## 2021-12-06 DIAGNOSIS — H0102B Squamous blepharitis left eye, upper and lower eyelids: Secondary | ICD-10-CM | POA: Diagnosis not present

## 2021-12-06 DIAGNOSIS — G4733 Obstructive sleep apnea (adult) (pediatric): Secondary | ICD-10-CM | POA: Diagnosis not present

## 2021-12-06 DIAGNOSIS — H34212 Partial retinal artery occlusion, left eye: Secondary | ICD-10-CM | POA: Diagnosis not present

## 2021-12-07 ENCOUNTER — Encounter (INDEPENDENT_AMBULATORY_CARE_PROVIDER_SITE_OTHER): Payer: Self-pay | Admitting: Family Medicine

## 2021-12-07 ENCOUNTER — Ambulatory Visit (INDEPENDENT_AMBULATORY_CARE_PROVIDER_SITE_OTHER): Payer: Medicare PPO | Admitting: Family Medicine

## 2021-12-07 VITALS — BP 144/76 | HR 59 | Temp 98.2°F | Ht 73.0 in | Wt 312.0 lb

## 2021-12-07 DIAGNOSIS — E1169 Type 2 diabetes mellitus with other specified complication: Secondary | ICD-10-CM | POA: Diagnosis not present

## 2021-12-07 DIAGNOSIS — Z7984 Long term (current) use of oral hypoglycemic drugs: Secondary | ICD-10-CM

## 2021-12-07 DIAGNOSIS — Z6841 Body Mass Index (BMI) 40.0 and over, adult: Secondary | ICD-10-CM

## 2021-12-07 DIAGNOSIS — E669 Obesity, unspecified: Secondary | ICD-10-CM

## 2021-12-07 DIAGNOSIS — E559 Vitamin D deficiency, unspecified: Secondary | ICD-10-CM

## 2021-12-07 MED ORDER — VITAMIN D (ERGOCALCIFEROL) 1.25 MG (50000 UNIT) PO CAPS: 50000.0000 [IU] | ORAL_CAPSULE | ORAL | 0 refills | Status: DC

## 2021-12-22 NOTE — Progress Notes (Signed)
Chief Complaint:   OBESITY Jalal is here to discuss his progress with his obesity treatment plan along with follow-up of his obesity related diagnoses. Geramy is on the Category 3 Plan and states he is following his eating plan approximately 60% of the time. Bocephus states he is walking for 15-20 minutes 5-6 times per week.  Today's visit was #: 15 Starting weight: 340 lbs Starting date: 12/28/2016 Today's weight: 312 lbs Today's date: 12/07/2021 Total lbs lost to date: 28 Total lbs lost since last in-office visit: 0  Interim History: Everitt has done well with maintaining his weight in the last 2 months. He has been sick with COVID and he is just now getting back to his routine with eating.   Subjective:   1. Type 2 diabetes mellitus with other specified complication, without long-term current use of insulin (HCC) Jams is doing well on metformin, with no side effects noted. He denies hypoglycemia.   2. Vitamin D deficiency Quintan is on Vitamin D, and he requests a refill. Last Vitamin D level was at goal.   Assessment/Plan:   1. Type 2 diabetes mellitus with other specified complication, without long-term current use of insulin (HCC) Aldridge will continue with his diet, exercise, and weight loss.   2. Vitamin D deficiency Estel will continue prescription Vitamin D 50,000 IU every 14 days, and we will refill for 1 month.  - Vitamin D, Ergocalciferol, (DRISDOL) 1.25 MG (50000 UNIT) CAPS capsule; Take 1 capsule (50,000 Units total) by mouth every 14 (fourteen) days.  Dispense: 2 capsule; Refill: 0  3. Obesity, Current BMI 41.3 Harvest is currently in the action stage of change. As such, his goal is to continue with weight loss efforts. He has agreed to the Category 3 Plan.   Exercise goals: As is.  Behavioral modification strategies: increasing lean protein intake.  Brenden has agreed to follow-up with our clinic in 4 weeks. He was informed of the importance of frequent  follow-up visits to maximize his success with intensive lifestyle modifications for his multiple health conditions.   Objective:   Blood pressure (!) 144/76, pulse (!) 59, temperature 98.2 F (36.8 C), height '6\' 1"'$  (1.854 m), weight (!) 312 lb (141.5 kg), SpO2 96 %. Body mass index is 41.16 kg/m.  General: Cooperative, alert, well developed, in no acute distress. HEENT: Conjunctivae and lids unremarkable. Cardiovascular: Regular rhythm.  Lungs: Normal work of breathing. Neurologic: No focal deficits.   Lab Results  Component Value Date   CREATININE 1.40 (H) 10/06/2021   BUN 22 10/06/2021   NA 134 (L) 10/06/2021   K 3.6 10/06/2021   CL 102 10/06/2021   CO2 23 10/06/2021   Lab Results  Component Value Date   ALT 31 02/22/2021   AST 20 02/22/2021   ALKPHOS 63 02/22/2021   BILITOT 0.5 02/22/2021   Lab Results  Component Value Date   HGBA1C 6.4 09/14/2021   HGBA1C 6.3 (H) 02/22/2021   HGBA1C 6.6 (H) 09/16/2020   HGBA1C 6.1 11/28/2019   HGBA1C 6.0 (H) 08/14/2019   Lab Results  Component Value Date   INSULIN 11.7 02/22/2021   INSULIN 9.1 09/16/2020   INSULIN 10.0 08/14/2019   INSULIN 12.4 03/25/2019   INSULIN 19.2 09/24/2018   Lab Results  Component Value Date   TSH 1.500 03/25/2019   Lab Results  Component Value Date   CHOL 143 02/22/2021   HDL 39 (L) 02/22/2021   LDLCALC 85 02/22/2021   LDLDIRECT 148 (H) 11/15/2007  TRIG 100 02/22/2021   CHOLHDL 5.2 (H) 01/26/2016   Lab Results  Component Value Date   VD25OH 70.4 02/22/2021   VD25OH 52.1 09/16/2020   VD25OH 58.3 02/27/2020   Lab Results  Component Value Date   WBC 6.6 10/06/2021   HGB 14.7 10/06/2021   HCT 44.5 10/06/2021   MCV 86.7 10/06/2021   PLT 235 10/06/2021   No results found for: IRON, TIBC, FERRITIN  Obesity Behavioral Intervention:   Approximately 15 minutes were spent on the discussion below.  ASK: We discussed the diagnosis of obesity with [name] today and Onnie agreed to give  [name] permission to discuss obesity behavioral modification therapy today.  ASSESS: Tory has the diagnosis of obesity and his BMI today is 41.3. Marcell is in the action stage of change.   ADVISE: Radley was educated on the multiple health risks of obesity as well as the benefit of weight loss to improve his health. He was advised of the need for long term treatment and the importance of lifestyle modifications to improve his current health and to decrease his risk of future health problems.  AGREE: Multiple dietary modification options and treatment options were discussed and Mihailo agreed to follow the recommendations documented in the above note.  ARRANGE: Chandlor was educated on the importance of frequent visits to treat obesity as outlined per CMS and USPSTF guidelines and agreed to schedule his next follow up appointment today.  Attestation Statements:   Reviewed by clinician on day of visit: allergies, medications, problem list, medical history, surgical history, family history, social history, and previous encounter notes.   I, Trixie Dredge, am acting as transcriptionist for [name], MD.  I have reviewed the above documentation for accuracy and completeness, and I agree with the above. -  [name], MD

## 2021-12-29 ENCOUNTER — Telehealth: Payer: Self-pay

## 2021-12-29 NOTE — Telephone Encounter (Signed)
-----   Message from Loel Dubonnet, NP sent at 12/29/2021  1:17 PM EDT ----- Regarding: RE: [employer] AUTHORIZATION Covering for Tessa while she is out of office.  Echo was ordered at last clinic visit to reassess LVEF after PCI. LVEF prior to PCI 45-50%. It should have been coded under VCB44 for systolic heart failure, CAD. Unsure why insurance is not approving as it has been 3 months since intervention.   Allyson - any chance by updating the ICD10 it will be reviewed differently?  Triage team - please cancel echocardiogram and update patient that we will not do echocardiogram at this time due to insurance coverage but remind him of his appt 01/11/22.  Scott - CCing you as he sees you 01/11/22.  Thanks! Loel Dubonnet, NP  ----- Message ----- From: Sharrell Ku Sent: 12/29/2021   8:47 AM EDT To: Loel Dubonnet, NP, Elgie Collard, PA-C Subject: [employer] AUTHORIZATION                           Good morning,   I'm unable to receive an authorization for the echo through [facility]. Based on auth/claim records, this member has requested another type of diagnostic testing in the year. Please review previous testing and consider withdrawing this request.  Thanks

## 2021-12-29 NOTE — Telephone Encounter (Signed)
Spoke with the patient and advised him that we are going to cancel his echocardiogram at this time. Advised that we will call him back once we gather more information and hopefully receive authorization. Patient is aware of his appointment on 6/13 with Scott.

## 2021-12-30 ENCOUNTER — Encounter (HOSPITAL_COMMUNITY): Payer: Self-pay

## 2021-12-30 ENCOUNTER — Ambulatory Visit (HOSPITAL_COMMUNITY): Payer: Medicare PPO

## 2022-01-03 ENCOUNTER — Ambulatory Visit (INDEPENDENT_AMBULATORY_CARE_PROVIDER_SITE_OTHER): Payer: Medicare PPO | Admitting: Family Medicine

## 2022-01-03 ENCOUNTER — Encounter (INDEPENDENT_AMBULATORY_CARE_PROVIDER_SITE_OTHER): Payer: Self-pay | Admitting: Family Medicine

## 2022-01-03 ENCOUNTER — Other Ambulatory Visit: Payer: Self-pay

## 2022-01-03 VITALS — BP 142/86 | HR 70 | Temp 98.4°F | Ht 73.0 in | Wt 311.0 lb

## 2022-01-03 DIAGNOSIS — E559 Vitamin D deficiency, unspecified: Secondary | ICD-10-CM

## 2022-01-03 DIAGNOSIS — E7849 Other hyperlipidemia: Secondary | ICD-10-CM

## 2022-01-03 DIAGNOSIS — Z6841 Body Mass Index (BMI) 40.0 and over, adult: Secondary | ICD-10-CM | POA: Diagnosis not present

## 2022-01-03 DIAGNOSIS — I10 Essential (primary) hypertension: Secondary | ICD-10-CM

## 2022-01-03 DIAGNOSIS — E669 Obesity, unspecified: Secondary | ICD-10-CM | POA: Diagnosis not present

## 2022-01-03 MED ORDER — ROSUVASTATIN CALCIUM 40 MG PO TABS: ORAL_TABLET | ORAL | 0 refills | Status: DC

## 2022-01-03 MED ORDER — VITAMIN D (ERGOCALCIFEROL) 1.25 MG (50000 UNIT) PO CAPS: 50000.0000 [IU] | ORAL_CAPSULE | ORAL | 0 refills | Status: DC

## 2022-01-04 ENCOUNTER — Other Ambulatory Visit: Payer: Self-pay | Admitting: Family Medicine

## 2022-01-04 ENCOUNTER — Encounter: Payer: Self-pay | Admitting: *Deleted

## 2022-01-04 DIAGNOSIS — E1169 Type 2 diabetes mellitus with other specified complication: Secondary | ICD-10-CM

## 2022-01-05 NOTE — Progress Notes (Signed)
Chief Complaint:   OBESITY [name] [name] is here to discuss his progress with his obesity treatment plan along with follow-up of his obesity related diagnoses. [name] [name] is on the Category 3 Plan and states he is following his eating plan approximately 80% of the time. [name] [name] states he is exercising for 60 minutes 7 times per week.  Today's visit was #: 65 Starting weight: 340 lbs Starting date: 12/28/2016 Today's weight: 311 lbs Today's date: 01/03/2022 Total lbs lost to date: 29 Total lbs lost since last in-office visit: 1  Interim History: [name] [name] has been very busy doing physical labor with gardening and building his sister's deck. He continues to be mindful of his eating plan and intake.   Subjective:   1. Vitamin D deficiency [name] [name] is stable on his medications, with no side effects noted. He is due for labs soon.   2. Essential hypertension [name] [name] blood pressure is mildly elevated today. He is stable on his medications and is normally controlled.   Assessment/Plan:   1. Vitamin D deficiency We will refill prescription Vitamin D for 1 month, and we will recheck labs in 1 month. [name] [name] will follow-up for routine testing of Vitamin D, at least 2-3 times per year to avoid over-replacement.  - Vitamin D, Ergocalciferol, (DRISDOL) 1.25 MG (50000 UNIT) CAPS capsule; Take 1 capsule (50,000 Units total) by mouth every 14 (fourteen) days.  Dispense: 2 capsule; Refill: 0  2. Essential hypertension [name] [name] will continue with his diet and exercise to improve blood pressure control. We will recheck his blood pressure in 4 weeks.   3. Obesity, Current BMI 41.1 [name] [name] is currently in the action stage of change. As such, his goal is to continue with weight loss efforts. He has agreed to the Category 3 Plan.   We will recheck fasting labs at his next visit.  Exercise goals: As is.  Behavioral modification strategies: increasing lean protein intake.  [name] [name] has agreed to follow-up with our  clinic in 4 weeks. He was informed of the importance of frequent follow-up visits to maximize his success with intensive lifestyle modifications for his multiple health conditions.   Objective:   Blood pressure (!) 142/86, pulse 70, temperature 98.4 F (36.9 C), height '6\' 1"'$  (1.854 m), weight (!) 311 lb (141.1 kg), SpO2 96 %. Body mass index is 41.03 kg/m.  General: Cooperative, alert, well developed, in no acute distress. HEENT: Conjunctivae and lids unremarkable. Cardiovascular: Regular rhythm.  Lungs: Normal work of breathing. Neurologic: No focal deficits.   Lab Results  Component Value Date   CREATININE 1.40 (H) 10/06/2021   BUN 22 10/06/2021   NA 134 (L) 10/06/2021   K 3.6 10/06/2021   CL 102 10/06/2021   CO2 23 10/06/2021   Lab Results  Component Value Date   ALT 31 02/22/2021   AST 20 02/22/2021   ALKPHOS 63 02/22/2021   BILITOT 0.5 02/22/2021   Lab Results  Component Value Date   HGBA1C 6.4 09/14/2021   HGBA1C 6.3 (H) 02/22/2021   HGBA1C 6.6 (H) 09/16/2020   HGBA1C 6.1 11/28/2019   HGBA1C 6.0 (H) 08/14/2019   Lab Results  Component Value Date   INSULIN 11.7 02/22/2021   INSULIN 9.1 09/16/2020   INSULIN 10.0 08/14/2019   INSULIN 12.4 03/25/2019   INSULIN 19.2 09/24/2018   Lab Results  Component Value Date   TSH 1.500 03/25/2019   Lab Results  Component Value Date   CHOL 143 02/22/2021   HDL 39 (L) 02/22/2021  LDLCALC 85 02/22/2021   LDLDIRECT 148 (H) 11/15/2007   TRIG 100 02/22/2021   CHOLHDL 5.2 (H) 01/26/2016   Lab Results  Component Value Date   VD25OH 70.4 02/22/2021   VD25OH 52.1 09/16/2020   VD25OH 58.3 02/27/2020   Lab Results  Component Value Date   WBC 6.6 10/06/2021   HGB 14.7 10/06/2021   HCT 44.5 10/06/2021   MCV 86.7 10/06/2021   PLT 235 10/06/2021   No results found for: IRON, TIBC, FERRITIN  Obesity Behavioral Intervention:   Approximately 15 minutes were spent on the discussion below.  ASK: We discussed the  diagnosis of obesity with [name] [name] today and [name] [name] agreed to give [name] permission to discuss obesity behavioral modification therapy today.  ASSESS: [name] [name] has the diagnosis of obesity and his BMI today is 41.1. Daimion is in the action stage of change.   ADVISE: [name] [name] was educated on the multiple health risks of obesity as well as the benefit of weight loss to improve his health. He was advised of the need for long term treatment and the importance of lifestyle modifications to improve his current health and to decrease his risk of future health problems.  AGREE: Multiple dietary modification options and treatment options were discussed and Yulian agreed to follow the recommendations documented in the above note.  ARRANGE: Darean was educated on the importance of frequent visits to treat obesity as outlined per CMS and USPSTF guidelines and agreed to schedule his next follow up appointment today.  Attestation Statements:   Reviewed by clinician on day of visit: allergies, medications, problem list, medical history, surgical history, family history, social history, and previous encounter notes.   I, Trixie Dredge, am acting as transcriptionist for [name], MD.  I have reviewed the above documentation for accuracy and completeness, and I agree with the above. -  [name], MD

## 2022-01-10 ENCOUNTER — Encounter: Payer: Self-pay | Admitting: Physician Assistant
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
## 2022-01-11 ENCOUNTER — Encounter: Payer: Self-pay | Admitting: Physician Assistant

## 2022-01-11 ENCOUNTER — Ambulatory Visit: Payer: Medicare PPO | Admitting: Physician Assistant

## 2022-01-11 VITALS — BP 140/80 | HR 68 | Ht 73.0 in | Wt 318.6 lb

## 2022-01-11 DIAGNOSIS — E785 Hyperlipidemia, unspecified: Secondary | ICD-10-CM | POA: Diagnosis not present

## 2022-01-11 DIAGNOSIS — I5022 Chronic systolic (congestive) heart failure: Secondary | ICD-10-CM | POA: Diagnosis not present

## 2022-01-11 DIAGNOSIS — R0602 Shortness of breath: Secondary | ICD-10-CM | POA: Diagnosis not present

## 2022-01-11 DIAGNOSIS — G4733 Obstructive sleep apnea (adult) (pediatric): Secondary | ICD-10-CM

## 2022-01-11 DIAGNOSIS — Z9989 Dependence on other enabling machines and devices: Secondary | ICD-10-CM | POA: Diagnosis not present

## 2022-01-11 DIAGNOSIS — I10 Essential (primary) hypertension: Secondary | ICD-10-CM | POA: Diagnosis not present

## 2022-01-11 DIAGNOSIS — G4730 Sleep apnea, unspecified: Secondary | ICD-10-CM

## 2022-01-11 DIAGNOSIS — I2510 Atherosclerotic heart disease of native coronary artery without angina pectoris: Secondary | ICD-10-CM

## 2022-01-11 DIAGNOSIS — I25119 Atherosclerotic heart disease of native coronary artery with unspecified angina pectoris: Secondary | ICD-10-CM

## 2022-01-11 IMAGING — CR DG CHEST 2V
2 series · 2 of 2 positions shown · non-contrast
Comparison: Chest x-ray 11/18/2015.

CLINICAL DATA: 73-year-old male with history of worsening
right-sided back and rib pain for the past 2 weeks.

EXAM:
CHEST - 2 VIEW

[w chest pa]
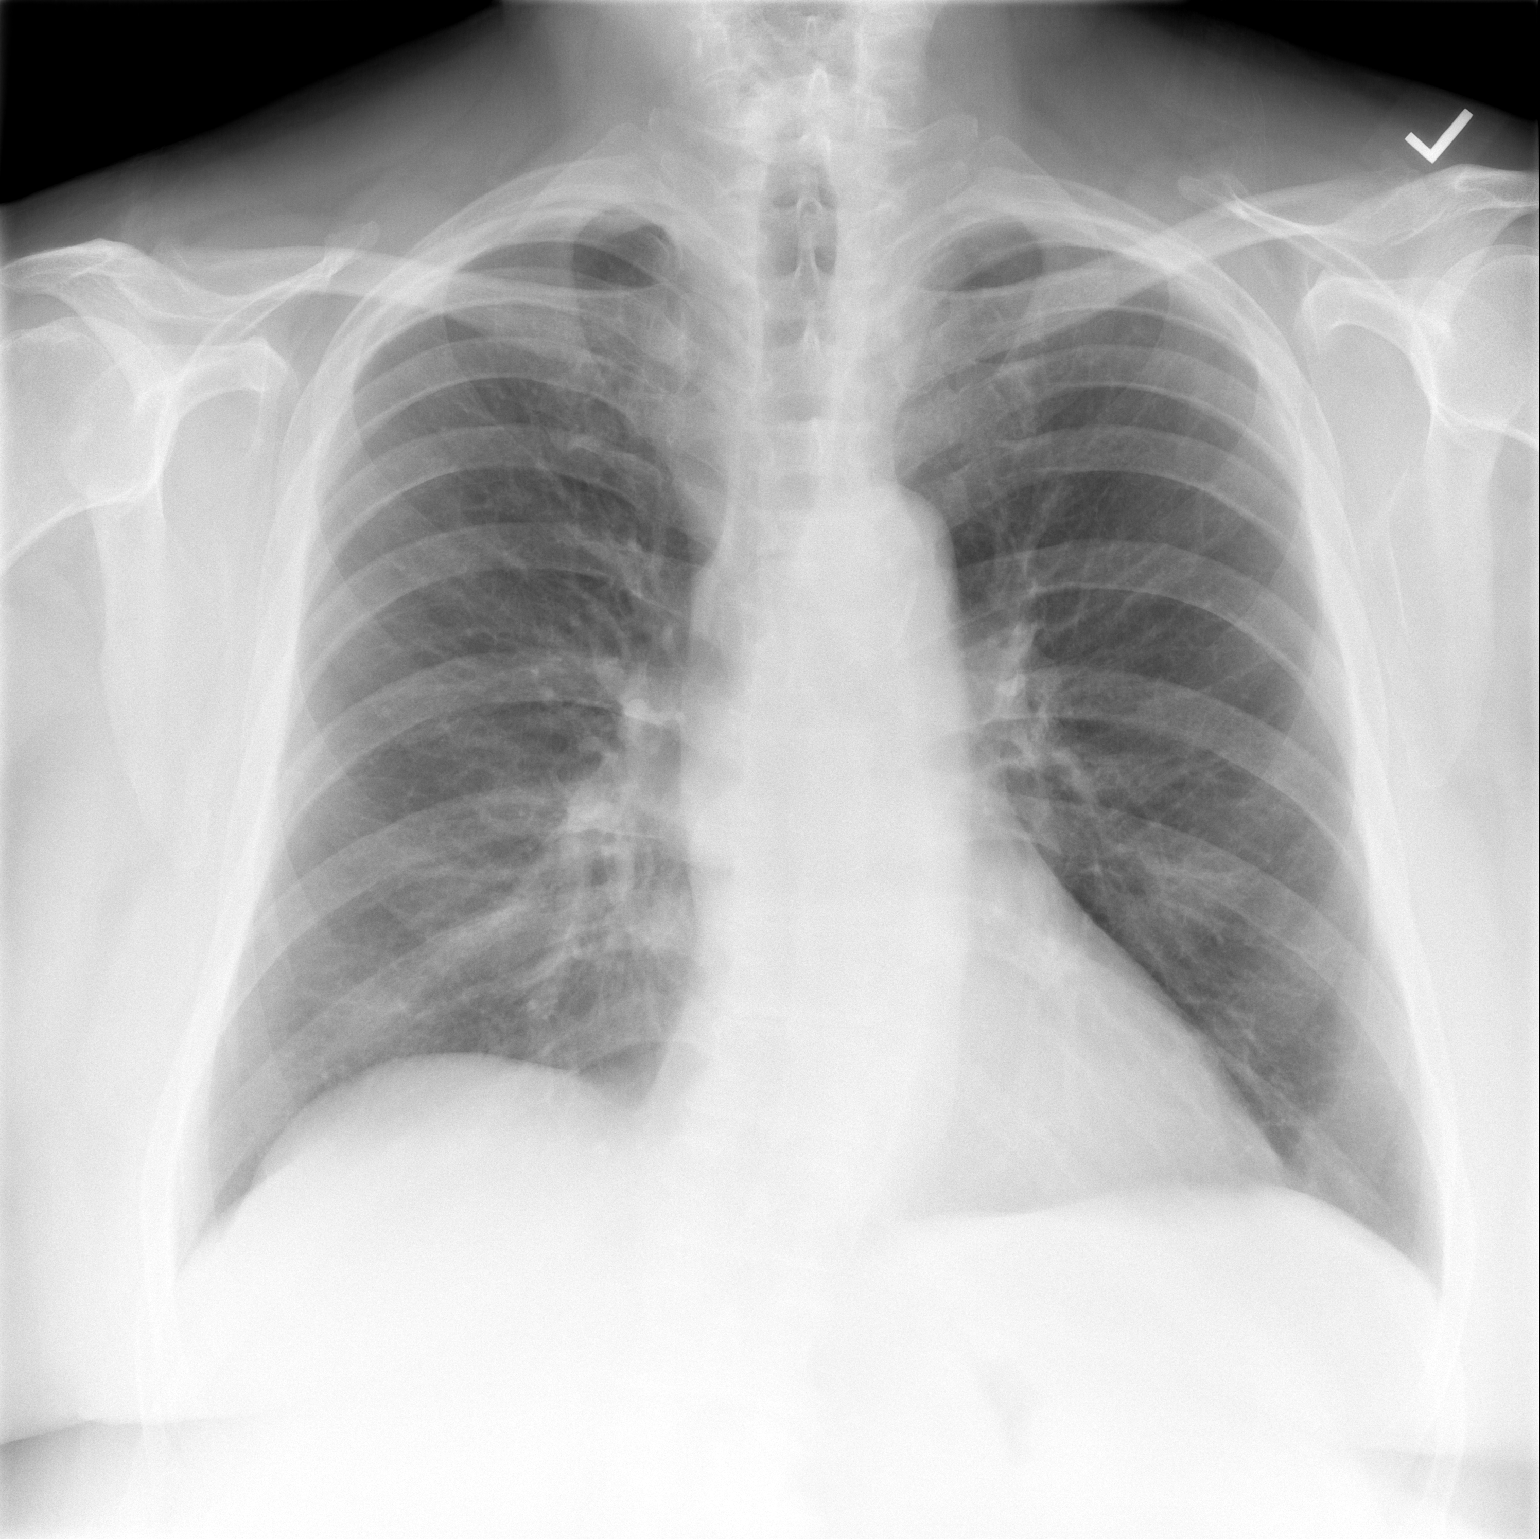

[w chest lat]
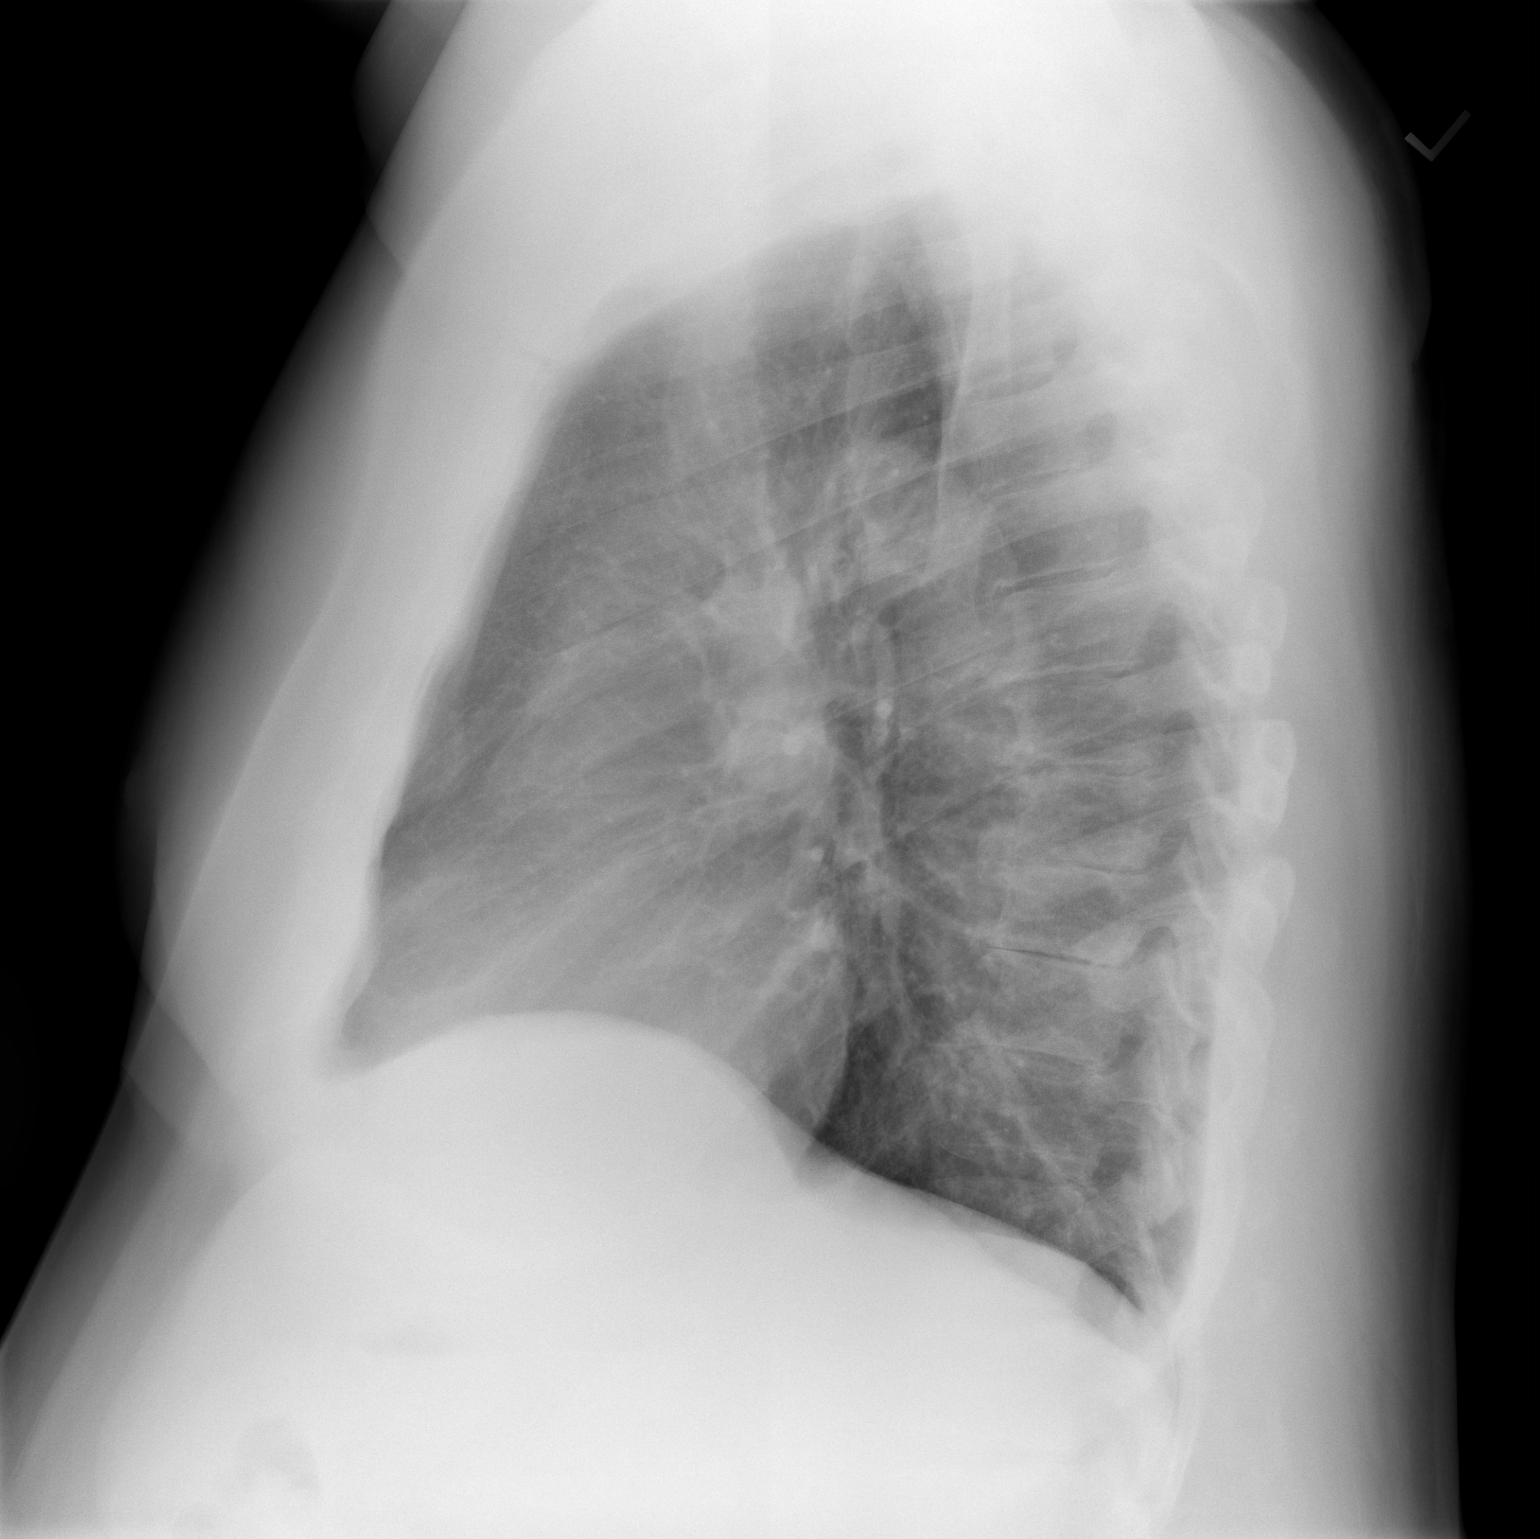

[2 of 2 positions shown; findings below may reference images not displayed]

FINDINGS: Lung volumes are normal. No consolidative airspace disease. No
pleural effusions. No pneumothorax. No pulmonary nodule or mass
noted. Pulmonary vasculature and the cardiomediastinal silhouette
are within normal limits. Atherosclerosis in the thoracic aorta.
IMPRESSION: 1.  No radiographic evidence of acute cardiopulmonary disease.
2. Aortic atherosclerosis.

## 2022-01-11 MED ORDER — ISOSORBIDE MONONITRATE ER 30 MG PO TB24: 30.0000 mg | ORAL_TABLET | Freq: Every day | ORAL | 3 refills | Status: DC

## 2022-01-11 NOTE — Assessment & Plan Note (Addendum)
EF 45-50 by echocardiogram in February 2023.  He recently has noted worsening shortness of breath with associated chest tightness.  He has a chronically occluded RCA.  He did not have anginal symptoms prior to his cardiac catheterization.  His blood pressure is running higher.  Question if his uncontrolled blood pressure is contributing versus volume excess.  CMET, CBC, BNP  Increase furosemide to 60 mg daily x3 days, then reduce to 40 mg daily  Obtain follow-up echocardiogram  Follow-up 3 to 4 weeks  Consider transitioning ARB to Entresto at follow-up versus adding MRA

## 2022-01-11 NOTE — Patient Instructions (Addendum)
Medication Instructions:   INCREASE Lasix one and one half tablet by mouth ( 60 mg) daily X 3 days than back to One tablet ( 40 mg ) daily.  START Imdur one ( 1 ) tablet by mouth ( 30 mg) daily.   *If you need a refill on your cardiac medications before your next appointment, please call your pharmacy*   Lab Work:  TODAY!!!!! CMET/LIPID/PRO BNP/CBC  If you have labs (blood work) drawn today and your tests are completely normal, you will receive your results only by: [place] (if you have MyChart) OR A paper copy in the mail If you have any lab test that is abnormal or we need to change your treatment, we will call you to review the results.   Testing/Procedures:   Your physician has requested that you have an echocardiogram. Echocardiography is a painless test that uses sound waves to create images of your heart. It provides your doctor with information about the size and shape of your heart and how well your heart's chambers and valves are working. This procedure takes approximately one hour. There are no restrictions for this procedure.     Follow-Up: At [facility], you and your health needs are our priority.  As part of our continuing mission to provide you with exceptional heart care, we have created designated Provider Care Teams.  These Care Teams include your primary Cardiologist (physician) and Advanced Practice Providers (APPs -  Physician Assistants and Nurse Practitioners) who all work together to provide you with the care you need, when you need it.  We recommend signing up for the patient portal called "MyChart".  Sign up information is provided on this After Visit Summary.  MyChart is used to connect with patients for Virtual Visits (Telemedicine).  Patients are able to view lab/test results, encounter notes, upcoming appointments, etc.  Non-urgent messages can be sent to your provider as well.   To learn more about what you can do with MyChart, go to  [email or web address].    Your next appointment:   4 week(s)  The format for your next appointment:   In Person  Provider:   Richardson Dopp, PA-C         Important Information About Sugar

## 2022-01-11 NOTE — Assessment & Plan Note (Addendum)
Status post DES to the LAD in March 2023.  He has known chronic occlusion of the RCA.  He had mild nonobstructive disease elsewhere.  He has noted exertional shortness of breath and chest tightness over the past couple of months.  Of note, he had no symptoms prior to his PCI.  His cath was prompted by an abnormal echocardiogram.  EKG today demonstrates no significant changes.  Question if his symptoms are related to angina from his chronically occluded RCA.  His weight is up and he has had some lower extremity swelling.  I suspect his symptoms are most likely due to volume excess.  As noted, follow-up echocardiogram be arranged.  I will obtain CMET, CBC, BNP.  He does not take PDE-5 inhibitors.  Start isosorbide mononitrate 30 mg daily.  Continue metoprolol 12.5 mg daily, aspirin 81 mg daily, clopidogrel 75 mg daily, rosuvastatin 40 mg daily.  Follow-up 3 to 4 weeks.

## 2022-01-11 NOTE — Assessment & Plan Note (Signed)
Blood pressure uncontrolled.  Continue losartan 25 mg daily, metoprolol succinate 12.5 mg daily.  Add isosorbide mononitrate 30 mg daily as noted above.

## 2022-01-11 NOTE — Assessment & Plan Note (Addendum)
Continue rosuvastatin 40 mg daily.  Obtain follow-up CMET, lipids today.

## 2022-01-11 NOTE — Assessment & Plan Note (Signed)
Managed by neurology

## 2022-01-12 LAB — LIPID PANEL
Chol/HDL Ratio: 3.8 ratio (ref 0.0–5.0)
Cholesterol, Total: 136 mg/dL (ref 100–199)
HDL: 36 mg/dL — ABNORMAL LOW (ref >39)
LDL Chol Calc (NIH): 80 mg/dL (ref 0–99)
Triglycerides: 107 mg/dL (ref 0–149)
VLDL Cholesterol Cal: 20 mg/dL (ref 5–40)

## 2022-01-12 LAB — COMPREHENSIVE METABOLIC PANEL
ALT: 23 IU/L (ref 0–44)
AST: 13 IU/L (ref 0–40)
Albumin/Globulin Ratio: 2.3 — ABNORMAL HIGH (ref 1.2–2.2)
Albumin: 4.3 g/dL (ref 3.7–4.7)
Alkaline Phosphatase: 81 IU/L (ref 44–121)
BUN/Creatinine Ratio: 15 (ref 10–24)
BUN: 19 mg/dL (ref 8–27)
Bilirubin Total: 0.6 mg/dL (ref 0.0–1.2)
CO2: 24 mmol/L (ref 20–29)
Calcium: 9.4 mg/dL (ref 8.6–10.2)
Chloride: 99 mmol/L (ref 96–106)
Creatinine, Ser: 1.23 mg/dL (ref 0.76–1.27)
Globulin, Total: 1.9 g/dL (ref 1.5–4.5)
Glucose: 280 mg/dL — ABNORMAL HIGH (ref 70–99)
Potassium: 4.4 mmol/L (ref 3.5–5.2)
Sodium: 137 mmol/L (ref 134–144)
Total Protein: 6.2 g/dL (ref 6.0–8.5)
eGFR: 61 mL/min/{1.73_m2} (ref >59)

## 2022-01-12 LAB — CBC
Hematocrit: 46.4 % (ref 37.5–51.0)
Hemoglobin: 15.5 g/dL (ref 13.0–17.7)
MCH: 28.4 pg (ref 26.6–33.0)
MCHC: 33.4 g/dL (ref 31.5–35.7)
MCV: 85 fL (ref 79–97)
Platelets: 278 10*3/uL (ref 150–450)
RBC: 5.45 x10E6/uL (ref 4.14–5.80)
RDW: 14.1 % (ref 11.6–15.4)
WBC: 6.6 10*3/uL (ref 3.4–10.8)

## 2022-01-12 LAB — PRO B NATRIURETIC PEPTIDE: NT-Pro BNP: 139 pg/mL (ref 0–486)

## 2022-01-17 ENCOUNTER — Telehealth: Payer: Self-pay

## 2022-01-17 ENCOUNTER — Other Ambulatory Visit: Payer: Medicare PPO

## 2022-01-17 DIAGNOSIS — I5022 Chronic systolic (congestive) heart failure: Secondary | ICD-10-CM

## 2022-01-17 DIAGNOSIS — E785 Hyperlipidemia, unspecified: Secondary | ICD-10-CM

## 2022-01-17 MED ORDER — EZETIMIBE 10 MG PO TABS: 10.0000 mg | ORAL_TABLET | Freq: Every day | ORAL | 3 refills | Status: DC

## 2022-01-17 NOTE — Telephone Encounter (Signed)
The patient has been notified of the result and verbalized understanding.  All questions (if any) were answered. Antonieta Iba, RN 01/17/2022 4:56 PM  Zetia has been sent in. Labs have been scheduled.

## 2022-01-17 NOTE — Telephone Encounter (Signed)
-----   Message from Liliane Shi, [place] sent at 01/13/2022  8:08 AM EDT ----- Glucose elevated.  Creatinine, K+, LFTs, Hgb normal.  LDL above goal (goal < 70). BNP normal. PLAN:  -Continue current meds/treatment plan. -Add Zetia 10 mg once daily to get LDL to goal. -Lipids, LFTs 3 mos.  -F/u for diabetes with PCP/endocrinology  Richardson Dopp, PA-C    01/13/2022 8:03 AM

## 2022-01-28 ENCOUNTER — Telehealth: Payer: Self-pay | Admitting: *Deleted

## 2022-01-28 ENCOUNTER — Ambulatory Visit (HOSPITAL_COMMUNITY): Payer: Medicare PPO | Attending: Cardiology

## 2022-01-28 DIAGNOSIS — I25119 Atherosclerotic heart disease of native coronary artery with unspecified angina pectoris: Secondary | ICD-10-CM | POA: Insufficient documentation

## 2022-01-28 DIAGNOSIS — I10 Essential (primary) hypertension: Secondary | ICD-10-CM | POA: Insufficient documentation

## 2022-01-28 DIAGNOSIS — E785 Hyperlipidemia, unspecified: Secondary | ICD-10-CM | POA: Diagnosis not present

## 2022-01-28 DIAGNOSIS — I5022 Chronic systolic (congestive) heart failure: Secondary | ICD-10-CM | POA: Diagnosis not present

## 2022-01-28 DIAGNOSIS — R0602 Shortness of breath: Secondary | ICD-10-CM | POA: Diagnosis not present

## 2022-01-28 DIAGNOSIS — I77810 Thoracic aortic ectasia: Secondary | ICD-10-CM

## 2022-01-28 LAB — ECHOCARDIOGRAM COMPLETE
Area-P 1/2: 2 cm2
S' Lateral: 3.6 cm

## 2022-01-28 NOTE — Progress Notes (Signed)
Pt has been made aware of normal result and verbalized understanding.  jw

## 2022-01-28 NOTE — Telephone Encounter (Signed)
-----   Message from Liliane Shi, [place] sent at 01/28/2022  1:28 PM EDT ----- Echo shows improved LV function.  EF is now normal at 55-60.  The ascending aorta is mildly dilated at 39 mm. PLAN:  -Continue current medications/treatment plan and follow up as scheduled.  -Send copy to PCP -Repeat echocardiogram in 1 year (dilated ascending aorta) Richardson Dopp, PA-C    01/28/2022 1:26 PM

## 2022-02-04 ENCOUNTER — Other Ambulatory Visit: Payer: Self-pay | Admitting: Family Medicine

## 2022-02-04 DIAGNOSIS — I10 Essential (primary) hypertension: Secondary | ICD-10-CM

## 2022-02-04 DIAGNOSIS — F3289 Other specified depressive episodes: Secondary | ICD-10-CM

## 2022-02-06 MED ORDER — METOPROLOL SUCCINATE ER 25 MG PO TB24: 12.5000 mg | ORAL_TABLET | Freq: Every day | ORAL | 0 refills | Status: DC

## 2022-02-07 ENCOUNTER — Ambulatory Visit (INDEPENDENT_AMBULATORY_CARE_PROVIDER_SITE_OTHER): Payer: Medicare PPO | Admitting: Family Medicine

## 2022-02-07 VITALS — BP 142/99 | HR 70 | Temp 98.0°F | Ht 73.0 in | Wt 308.0 lb

## 2022-02-07 DIAGNOSIS — Z6841 Body Mass Index (BMI) 40.0 and over, adult: Secondary | ICD-10-CM | POA: Diagnosis not present

## 2022-02-07 DIAGNOSIS — I25119 Atherosclerotic heart disease of native coronary artery with unspecified angina pectoris: Secondary | ICD-10-CM

## 2022-02-07 DIAGNOSIS — E559 Vitamin D deficiency, unspecified: Secondary | ICD-10-CM | POA: Diagnosis not present

## 2022-02-07 DIAGNOSIS — E669 Obesity, unspecified: Secondary | ICD-10-CM | POA: Diagnosis not present

## 2022-02-07 DIAGNOSIS — E7849 Other hyperlipidemia: Secondary | ICD-10-CM | POA: Diagnosis not present

## 2022-02-07 DIAGNOSIS — E785 Hyperlipidemia, unspecified: Secondary | ICD-10-CM | POA: Diagnosis not present

## 2022-02-07 DIAGNOSIS — E1159 Type 2 diabetes mellitus with other circulatory complications: Secondary | ICD-10-CM

## 2022-02-08 ENCOUNTER — Encounter (INDEPENDENT_AMBULATORY_CARE_PROVIDER_SITE_OTHER): Payer: Self-pay | Admitting: Family Medicine

## 2022-02-08 LAB — CBC WITH DIFFERENTIAL/PLATELET
Basophils Absolute: 0 10*3/uL (ref 0.0–0.2)
Basos: 1 %
EOS (ABSOLUTE): 0.4 10*3/uL (ref 0.0–0.4)
Eos: 6 %
Hematocrit: 46.8 % (ref 37.5–51.0)
Hemoglobin: 15.3 g/dL (ref 13.0–17.7)
Immature Grans (Abs): 0 10*3/uL (ref 0.0–0.1)
Immature Granulocytes: 0 %
Lymphocytes Absolute: 1 10*3/uL (ref 0.7–3.1)
Lymphs: 16 %
MCH: 28.4 pg (ref 26.6–33.0)
MCHC: 32.7 g/dL (ref 31.5–35.7)
MCV: 87 fL (ref 79–97)
Monocytes Absolute: 0.4 10*3/uL (ref 0.1–0.9)
Monocytes: 6 %
Neutrophils Absolute: 4.6 10*3/uL (ref 1.4–7.0)
Neutrophils: 71 %
Platelets: 263 10*3/uL (ref 150–450)
RBC: 5.39 x10E6/uL (ref 4.14–5.80)
RDW: 14.3 % (ref 11.6–15.4)
WBC: 6.5 10*3/uL (ref 3.4–10.8)

## 2022-02-08 LAB — HEMOGLOBIN A1C
Est. average glucose Bld gHb Est-mCnc: 266 mg/dL
Hgb A1c MFr Bld: 10.9 % — ABNORMAL HIGH (ref 4.8–5.6)

## 2022-02-08 LAB — LIPID PANEL WITH LDL/HDL RATIO
Cholesterol, Total: 108 mg/dL (ref 100–199)
HDL: 30 mg/dL — ABNORMAL LOW (ref >39)
LDL Chol Calc (NIH): 59 mg/dL (ref 0–99)
LDL/HDL Ratio: 2 ratio (ref 0.0–3.6)
Triglycerides: 101 mg/dL (ref 0–149)
VLDL Cholesterol Cal: 19 mg/dL (ref 5–40)

## 2022-02-08 LAB — CMP14+EGFR
ALT: 60 IU/L — ABNORMAL HIGH (ref 0–44)
AST: 37 IU/L (ref 0–40)
Albumin/Globulin Ratio: 2.2 (ref 1.2–2.2)
Albumin: 4.2 g/dL (ref 3.8–4.8)
Alkaline Phosphatase: 88 IU/L (ref 44–121)
BUN/Creatinine Ratio: 15 (ref 10–24)
BUN: 18 mg/dL (ref 8–27)
Bilirubin Total: 0.6 mg/dL (ref 0.0–1.2)
CO2: 24 mmol/L (ref 20–29)
Calcium: 9.2 mg/dL (ref 8.6–10.2)
Chloride: 99 mmol/L (ref 96–106)
Creatinine, Ser: 1.22 mg/dL (ref 0.76–1.27)
Globulin, Total: 1.9 g/dL (ref 1.5–4.5)
Glucose: 385 mg/dL — ABNORMAL HIGH (ref 70–99)
Potassium: 4.7 mmol/L (ref 3.5–5.2)
Sodium: 137 mmol/L (ref 134–144)
Total Protein: 6.1 g/dL (ref 6.0–8.5)
eGFR: 62 mL/min/{1.73_m2} (ref >59)

## 2022-02-08 LAB — INSULIN, RANDOM: INSULIN: 12.8 u[IU]/mL (ref 2.6–24.9)

## 2022-02-08 LAB — VITAMIN D 25 HYDROXY (VIT D DEFICIENCY, FRACTURES): Vit D, 25-Hydroxy: 60.3 ng/mL (ref 30.0–100.0)

## 2022-02-08 LAB — VITAMIN B12: Vitamin B-12: 714 pg/mL (ref 232–1245)

## 2022-02-08 NOTE — Progress Notes (Signed)
Chief Complaint:   OBESITY [name] [name] is here to discuss his progress with his obesity treatment plan along with follow-up of his obesity related diagnoses. [name] [name] is on the Category 3 Plan and states he is following his eating plan approximately 95% of the time. [name] [name] states he is walking for 15 minutes 5 times per week.  Today's visit was #: 73 Starting weight: 340 lbs Starting date: 12/28/2016 Today's weight: 308 lbs Today's date: 02/07/2022 Total lbs lost to date: 32 Total lbs lost since last in-office visit: 3  Interim History: [name] [name] continues to do well with weight loss. He is working on staying active and his hunger is mostly controlled.   Subjective:   1. Type 2 diabetes mellitus with other circulatory complication, without long-term current use of insulin (HCC) [name] [name]'s last A1c was well controlled and he is due to be rechecked.  He had a postop nonfasting glucose of 280 which is higher than expected.  2. Other hyperlipidemia [name] [name] is on Crestor and Zetia.  He is following a low cholesterol diet and he is followed by cardiology.  3. Coronary artery disease involving native coronary artery of native heart with angina pectoris [facility]) [place] is followed by cardiology.  He was started on isosorbide mononitrate and Plavix was refilled.  He thinks he may have been on Plavix 2 times per day.  He has prolonged bleeding, but this is unchanged since he started Plavix.  4. Vitamin D deficiency [name] [name] is on vitamin D, and he is due for labs.  Assessment/Plan:   1. Type 2 diabetes mellitus with other circulatory complication, without long-term current use of insulin (HCC) We will check labs today.  [name] [name] is to check his blood sugars twice daily and we will discuss this in 3 to 4 weeks.  - CMP14+EGFR - CBC with Differential/Platelet - Insulin, random - Hemoglobin A1c - Vitamin B12  2. Other hyperlipidemia We will check labs today, [name] [name] will continue with his diet and  exercise.  We will follow-up at his next visit.  - Lipid Panel With LDL/HDL Ratio  3. Coronary artery disease involving native coronary artery of native heart with angina pectoris ([place]) [name] [name] was educated on how medications affect heart and decrease risk of myocardial infarction.  He will go home and check to see that he is only on Plavix once daily.  4. Vitamin D deficiency We will check labs today, and we will follow-[facility] at [facility] next visit.  - VITAMIN D 25 Hydroxy (Vit-D Deficiency, Fractures)  5. Obesity, Current BMI 40.6 [name] is currently in the action stage of change. As such, his goal is to continue with weight loss efforts. He has agreed to the Category 3 Plan.   Exercise goals: As is.   Behavioral modification strategies: increasing lean protein intake.  [name] [name] has agreed to follow-up with our clinic in 3 to 4 weeks. He was informed of the importance of frequent follow-up visits to maximize his success with intensive lifestyle modifications for his multiple health conditions.   [name] [name] was informed we would discuss his lab results at his next visit unless there is a critical issue that needs to be addressed sooner. Lambert agreed to keep his next visit at the agreed upon time to discuss these results.  Objective:   Blood pressure (!) 142/99, pulse 70, temperature 98 F (36.7 C), height '6\' 1"'  (1.854 m), weight (!) 308 lb (139.7 kg), SpO2 97 %. Body mass index is 40.64 kg/m.  General: Cooperative, alert, well  developed, in no acute distress. HEENT: Conjunctivae and lids unremarkable. Cardiovascular: Regular rhythm.  Lungs: Normal work of breathing. Neurologic: No focal deficits.   Lab Results  Component Value Date   CREATININE 1.22 02/07/2022   BUN 18 02/07/2022   NA 137 02/07/2022   K 4.7 02/07/2022   CL 99 02/07/2022   CO2 24 02/07/2022   Lab Results  Component Value Date   ALT 60 (H) 02/07/2022   AST 37 02/07/2022   ALKPHOS 88 02/07/2022   BILITOT 0.6  02/07/2022   Lab Results  Component Value Date   HGBA1C 10.9 (H) 02/07/2022   HGBA1C 6.4 09/14/2021   HGBA1C 6.3 (H) 02/22/2021   HGBA1C 6.6 (H) 09/16/2020   HGBA1C 6.1 11/28/2019   Lab Results  Component Value Date   INSULIN 12.8 02/07/2022   INSULIN 11.7 02/22/2021   INSULIN 9.1 09/16/2020   INSULIN 10.0 08/14/2019   INSULIN 12.4 03/25/2019   Lab Results  Component Value Date   TSH 1.500 03/25/2019   Lab Results  Component Value Date   CHOL 108 02/07/2022   HDL 30 (L) 02/07/2022   LDLCALC 59 02/07/2022   LDLDIRECT 148 (H) 11/15/2007   TRIG 101 02/07/2022   CHOLHDL 3.8 01/11/2022   Lab Results  Component Value Date   VD25OH 60.3 02/07/2022   VD25OH 70.4 02/22/2021   VD25OH 52.1 09/16/2020   Lab Results  Component Value Date   WBC 6.5 02/07/2022   HGB 15.3 02/07/2022   HCT 46.8 02/07/2022   MCV 87 02/07/2022   PLT 263 02/07/2022   No results found for: "IRON", "TIBC", "FERRITIN"  Attestation Statements:   Reviewed by clinician on day of visit: allergies, medications, problem list, medical history, surgical history, family history, social history, and previous encounter notes.  Time spent on visit including pre-visit chart review and post-visit care and charting was 45 minutes.   I, Trixie Dredge, am acting as transcriptionist for [name], MD.  I have reviewed the above documentation for accuracy and completeness, and I agree with the above. -  [name], MD

## 2022-02-09 ENCOUNTER — Other Ambulatory Visit (INDEPENDENT_AMBULATORY_CARE_PROVIDER_SITE_OTHER): Payer: Self-pay | Admitting: Family Medicine

## 2022-02-09 DIAGNOSIS — E559 Vitamin D deficiency, unspecified: Secondary | ICD-10-CM

## 2022-02-09 NOTE — Telephone Encounter (Signed)
Dr.Beasley 

## 2022-02-09 NOTE — Telephone Encounter (Signed)
Pt called to request Vitamin D called in to [place].  Pt states he was just seen with Dr. Leafy Ro and should have been sent.

## 2022-02-10 ENCOUNTER — Encounter: Payer: Self-pay | Admitting: Physician Assistant

## 2022-02-10 DIAGNOSIS — I77810 Thoracic aortic ectasia: Secondary | ICD-10-CM | POA: Insufficient documentation

## 2022-02-10 MED ORDER — VITAMIN D (ERGOCALCIFEROL) 1.25 MG (50000 UNIT) PO CAPS: 50000.0000 [IU] | ORAL_CAPSULE | ORAL | 0 refills | Status: DC
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
## 2022-02-10 NOTE — Telephone Encounter (Signed)
LAST APPOINTMENT DATE: 02/07/2022 NEXT APPOINTMENT DATE: 03/07/2022   [facility] - [place] [address] [place] [address] [place] [address] Phone: [phone] Fax: [phone]  Patient is requesting a refill of the following medications: Requested Prescriptions    No prescriptions requested or ordered in this encounter    Date last filled: 01/03/2022 Previously prescribed by Dr. Leafy Ro  Lab Results  Component Value Date   HGBA1C 10.9 (H) 02/07/2022   HGBA1C 6.4 09/14/2021   HGBA1C 6.3 (H) 02/22/2021   Lab Results  Component Value Date   LDLCALC 59 02/07/2022   CREATININE 1.22 02/07/2022   Lab Results  Component Value Date   VD25OH 60.3 02/07/2022   VD25OH 70.4 02/22/2021   VD25OH 52.1 09/16/2020    BP Readings from Last 3 Encounters:  02/07/22 (!) 142/99  01/11/22 140/80  01/03/22 (!) 142/86

## 2022-02-11 ENCOUNTER — Encounter: Payer: Self-pay | Admitting: Physician Assistant

## 2022-02-11 ENCOUNTER — Ambulatory Visit (INDEPENDENT_AMBULATORY_CARE_PROVIDER_SITE_OTHER): Payer: Medicare PPO | Admitting: Physician Assistant

## 2022-02-11 VITALS — BP 118/70 | HR 88 | Ht 73.0 in | Wt 312.0 lb

## 2022-02-11 DIAGNOSIS — I77810 Thoracic aortic ectasia: Secondary | ICD-10-CM

## 2022-02-11 DIAGNOSIS — E785 Hyperlipidemia, unspecified: Secondary | ICD-10-CM | POA: Diagnosis not present

## 2022-02-11 DIAGNOSIS — I10 Essential (primary) hypertension: Secondary | ICD-10-CM

## 2022-02-11 DIAGNOSIS — E1159 Type 2 diabetes mellitus with other circulatory complications: Secondary | ICD-10-CM | POA: Diagnosis not present

## 2022-02-11 DIAGNOSIS — I25119 Atherosclerotic heart disease of native coronary artery with unspecified angina pectoris: Secondary | ICD-10-CM

## 2022-02-11 DIAGNOSIS — I5032 Chronic diastolic (congestive) heart failure: Secondary | ICD-10-CM

## 2022-02-11 MED ORDER — GLIMEPIRIDE 2 MG PO TABS: 2.0000 mg | ORAL_TABLET | Freq: Every day | ORAL | 1 refills | Status: DC

## 2022-02-11 NOTE — Assessment & Plan Note (Signed)
Status post DES to the LAD in March 2023.  He has known chronic occlusion of the RCA.  His chest symptoms have resolved on isosorbide.  Continue isosorbide mononitrate 30 mg daily, aspirin 81 mg daily, Plavix 85 mg daily, Crestor 40 mg daily, Toprol-XL 12.5 mg daily.

## 2022-02-11 NOTE — Assessment & Plan Note (Signed)
His ejection fraction has returned to normal by recent echocardiogram.  He is now NYHA II.  His volume status is improved and stable. Continue furosemide 40 mg daily.  His diabetes is out of control.  He would benefit from SGLT2 inhibitor both from a cardiac standpoint as well as for his diabetes.  However, given his sugars are typically in the 300 range, I am hesitant to start SGLT2 inhibitor at this time.  Once his sugars are better controlled, we definitely should consider Jardiance or [place].  Follow-up in 6 months.

## 2022-02-11 NOTE — Assessment & Plan Note (Signed)
LDL optimal on most recent lab work.  Continue current Rx.   

## 2022-02-11 NOTE — Assessment & Plan Note (Signed)
He is currently only on metformin 1000 mg twice daily.  As noted, with his HFpEF, he would benefit from SGLT2 inhibitor.  However, I am concerned about significant glucosuria in the setting of sugars that typically run in the 300 range.  He notes that he has follow-up at the healthy weight and wellness clinic in 2 weeks with Dr. Leafy Ro.  He notes that she has discussed managing his diabetes with him.  I will place him on glimepiride 2 mg daily with food for now to get his sugars under better control over the next couple of weeks.  As noted, he will follow-up with Dr. Leafy Ro August 7.

## 2022-02-11 NOTE — Assessment & Plan Note (Signed)
The patient's blood pressure is controlled on his current regimen.  Continue current therapy.  

## 2022-02-11 NOTE — Assessment & Plan Note (Signed)
39 mm on recent echocardiogram.  Repeat echo will be obtained in 1 year.

## 2022-02-11 NOTE — Patient Instructions (Signed)
Medication Instructions:  Your physician has recommended you make the following change in your medication:   START Glimepiride 2 mg taking 1 daily with breakfast.  Your primary care dr will follow this once you see them.  *If you need a refill on your cardiac medications before your next appointment, please call your pharmacy*   Lab Work: None ordered  If you have labs (blood work) drawn today and your tests are completely normal, you will receive your results only by: [place] (if you have MyChart) OR A paper copy in the mail If you have any lab test that is abnormal or we need to change your treatment, we will call you to review the results.   Testing/Procedures: None ordered   Follow-Up: At [facility], you and your health needs are our priority.  As part of our continuing mission to provide you with exceptional heart care, we have created designated Provider Care Teams.  These Care Teams include your primary Cardiologist (physician) and Advanced Practice Providers (APPs -  Physician Assistants and Nurse Practitioners) who all work together to provide you with the care you need, when you need it.  We recommend signing up for the patient portal called "MyChart".  Sign up information is provided on this After Visit Summary.  MyChart is used to connect with patients for Virtual Visits (Telemedicine).  Patients are able to view lab/test results, encounter notes, upcoming appointments, etc.  Non-urgent messages can be sent to your provider as well.   To learn more about what you can do with MyChart, go to [email or web address].    Your next appointment:   6 month(s)  The format for your next appointment:   In Person  Provider:   [name], MD     Other Instructions   Important Information About Sugar

## 2022-02-13 ENCOUNTER — Other Ambulatory Visit: Payer: Self-pay | Admitting: Student

## 2022-02-13 DIAGNOSIS — F3289 Other specified depressive episodes: Secondary | ICD-10-CM

## 2022-02-21 ENCOUNTER — Telehealth: Payer: Self-pay | Admitting: *Deleted

## 2022-02-21 NOTE — Telephone Encounter (Signed)
-----   Message from Alen Bleacher, MD sent at 02/16/2022  7:09 AM EDT ----- Regarding: Needs follow up Please can we schedule this patient for a DM follow up. His latest A1c is high Thanks  John  ----- Message ----- From: Zenia Resides, MD Sent: 02/08/2022   5:00 PM EDT To: Alen Bleacher, MD  Laren Boom are inheriting this patient from [facility].  It looks like his diabetes has gone from well controled to poorly controled.  Likely, you should have him come in for a visit. [place] ----- Message ----- From: Starlyn Skeans, MD Sent: 02/08/2022   4:35 PM EDT To: Concepcion Living, MD; Alen Bleacher, MD

## 2022-02-21 NOTE — Telephone Encounter (Signed)
LVM for pt to call office to see about getting an appointment scheduled per Dr. Adah Salvage.  If pt calls back please assist in getting this scheduled.  I will also send a MyCHart message to pt as well. Tonika Eden Zimmerman Rumple, CMA

## 2022-02-21 NOTE — Telephone Encounter (Signed)
Apt made with PCP.  

## 2022-03-07 ENCOUNTER — Encounter (INDEPENDENT_AMBULATORY_CARE_PROVIDER_SITE_OTHER): Payer: Self-pay | Admitting: Family Medicine

## 2022-03-07 ENCOUNTER — Ambulatory Visit (INDEPENDENT_AMBULATORY_CARE_PROVIDER_SITE_OTHER): Payer: Medicare PPO | Admitting: Family Medicine

## 2022-03-07 ENCOUNTER — Other Ambulatory Visit: Payer: Self-pay | Admitting: Physician Assistant

## 2022-03-07 VITALS — BP 123/69 | HR 68 | Temp 98.0°F | Ht 73.0 in | Wt 312.0 lb

## 2022-03-07 DIAGNOSIS — E785 Hyperlipidemia, unspecified: Secondary | ICD-10-CM | POA: Diagnosis not present

## 2022-03-07 DIAGNOSIS — E669 Obesity, unspecified: Secondary | ICD-10-CM

## 2022-03-07 DIAGNOSIS — E559 Vitamin D deficiency, unspecified: Secondary | ICD-10-CM | POA: Diagnosis not present

## 2022-03-07 DIAGNOSIS — E1159 Type 2 diabetes mellitus with other circulatory complications: Secondary | ICD-10-CM | POA: Diagnosis not present

## 2022-03-07 DIAGNOSIS — E7849 Other hyperlipidemia: Secondary | ICD-10-CM | POA: Diagnosis not present

## 2022-03-07 DIAGNOSIS — Z6841 Body Mass Index (BMI) 40.0 and over, adult: Secondary | ICD-10-CM

## 2022-03-07 MED ORDER — VITAMIN D (ERGOCALCIFEROL) 1.25 MG (50000 UNIT) PO CAPS: 50000.0000 [IU] | ORAL_CAPSULE | ORAL | 0 refills | Status: DC

## 2022-03-07 MED ORDER — SEMAGLUTIDE (1 MG/DOSE) 4 MG/3ML ~~LOC~~ SOPN: 1.0000 mg | PEN_INJECTOR | SUBCUTANEOUS | 0 refills | Status: DC

## 2022-03-08 ENCOUNTER — Telehealth (INDEPENDENT_AMBULATORY_CARE_PROVIDER_SITE_OTHER): Payer: Self-pay | Admitting: Family Medicine

## 2022-03-08 LAB — LIPID PANEL WITH LDL/HDL RATIO
Cholesterol, Total: 115 mg/dL (ref 100–199)
HDL: 38 mg/dL — ABNORMAL LOW (ref >39)
LDL Chol Calc (NIH): 57 mg/dL (ref 0–99)
LDL/HDL Ratio: 1.5 ratio (ref 0.0–3.6)
Triglycerides: 108 mg/dL (ref 0–149)
VLDL Cholesterol Cal: 20 mg/dL (ref 5–40)

## 2022-03-08 LAB — HEMOGLOBIN A1C
Est. average glucose Bld gHb Est-mCnc: 243 mg/dL
Hgb A1c MFr Bld: 10.1 % — ABNORMAL HIGH (ref 4.8–5.6)

## 2022-03-08 LAB — CBC WITH DIFFERENTIAL/PLATELET
Basophils Absolute: 0.1 10*3/uL (ref 0.0–0.2)
Basos: 1 %
EOS (ABSOLUTE): 0.5 10*3/uL — ABNORMAL HIGH (ref 0.0–0.4)
Eos: 7 %
Hematocrit: 48.7 % (ref 37.5–51.0)
Hemoglobin: 15.8 g/dL (ref 13.0–17.7)
Immature Grans (Abs): 0 10*3/uL (ref 0.0–0.1)
Immature Granulocytes: 0 %
Lymphocytes Absolute: 1 10*3/uL (ref 0.7–3.1)
Lymphs: 16 %
MCH: 28.4 pg (ref 26.6–33.0)
MCHC: 32.4 g/dL (ref 31.5–35.7)
MCV: 87 fL (ref 79–97)
Monocytes Absolute: 0.4 10*3/uL (ref 0.1–0.9)
Monocytes: 6 %
Neutrophils Absolute: 4.6 10*3/uL (ref 1.4–7.0)
Neutrophils: 70 %
Platelets: 254 10*3/uL (ref 150–450)
RBC: 5.57 x10E6/uL (ref 4.14–5.80)
RDW: 14.3 % (ref 11.6–15.4)
WBC: 6.5 10*3/uL (ref 3.4–10.8)

## 2022-03-08 LAB — CMP14+EGFR
ALT: 39 IU/L (ref 0–44)
AST: 21 IU/L (ref 0–40)
Albumin/Globulin Ratio: 2.2 (ref 1.2–2.2)
Albumin: 4.3 g/dL (ref 3.8–4.8)
Alkaline Phosphatase: 69 IU/L (ref 44–121)
BUN/Creatinine Ratio: 18 (ref 10–24)
BUN: 22 mg/dL (ref 8–27)
Bilirubin Total: 0.6 mg/dL (ref 0.0–1.2)
CO2: 24 mmol/L (ref 20–29)
Calcium: 9.6 mg/dL (ref 8.6–10.2)
Chloride: 99 mmol/L (ref 96–106)
Creatinine, Ser: 1.23 mg/dL (ref 0.76–1.27)
Globulin, Total: 2 g/dL (ref 1.5–4.5)
Glucose: 222 mg/dL — ABNORMAL HIGH (ref 70–99)
Potassium: 4.3 mmol/L (ref 3.5–5.2)
Sodium: 137 mmol/L (ref 134–144)
Total Protein: 6.3 g/dL (ref 6.0–8.5)
eGFR: 61 mL/min/{1.73_m2} (ref >59)

## 2022-03-08 LAB — INSULIN, RANDOM: INSULIN: 22.7 u[IU]/mL (ref 2.6–24.9)

## 2022-03-08 LAB — TSH: TSH: 1.26 u[IU]/mL (ref 0.450–4.500)

## 2022-03-08 LAB — VITAMIN D 25 HYDROXY (VIT D DEFICIENCY, FRACTURES): Vit D, 25-Hydroxy: 60.9 ng/mL (ref 30.0–100.0)

## 2022-03-08 LAB — MICROALBUMIN / CREATININE URINE RATIO
Creatinine, Urine: 29.3 mg/dL
Microalb/Creat Ratio: <10 mg/g creat (ref 0–29)
Microalbumin, Urine: <3 ug/mL

## 2022-03-08 NOTE — Telephone Encounter (Signed)
Pt called stating he has his Ozempic now. Pt states that Dr. Leafy Ro instructed him to call her once he had the medication, so that she may instruct him on how to complete the clicks. Patient asked to be called at [phone].

## 2022-03-09 ENCOUNTER — Encounter (INDEPENDENT_AMBULATORY_CARE_PROVIDER_SITE_OTHER): Payer: Self-pay

## 2022-03-09 ENCOUNTER — Encounter (INDEPENDENT_AMBULATORY_CARE_PROVIDER_SITE_OTHER): Payer: Self-pay | Admitting: Family Medicine

## 2022-03-09 NOTE — Telephone Encounter (Signed)
Please advise 

## 2022-03-09 NOTE — Telephone Encounter (Signed)
Message sent from Dr Leafy Ro

## 2022-03-14 NOTE — Progress Notes (Unsigned)
Chief Complaint:   OBESITY [name] [name] is here to discuss his progress with his obesity treatment plan along with follow-up of his obesity related diagnoses. Shadrack is on the Category 3 Plan and states he is following his eating plan approximately 80% of the time. Aniruddh states he is doing 0 minutes 0 times per week.  Today's visit was #: 61 Starting weight: 340 lbs Starting date: 12/28/2016 Today's weight: 312 lbs Today's date: 03/07/2022 Total lbs lost to date: 28 Total lbs lost since last in-office visit: 0  Interim History: [name] [name] is gaining weight again and he is naturally frustrated with this.  He has been off his GLP-1 and this is likely a big contributing factor.  He had a fall recently and exercise was decreased.  He is working on staying active as the heat allows.  Subjective:   1. Type 2 diabetes mellitus with other circulatory complication, without long-term current use of insulin (HCC) [name] [name]'s fasting blood sugar ranges between 194-436, and 2-hour postprandial 182-414.  He recently was started on glipizide and his glucose readings have decreased about 100 points.  He was on Ozempic previously and was better controlled, but could not afford it due to the donut hole.  2. Other hyperlipidemia [name] [name] is working on decreasing cholesterol in his diet.  He is stable on his medications, he is due for labs.  3. Vitamin D deficiency [name] [name] is on vitamin D, and he is due for labs.  He requests a refill today.  Assessment/Plan:   1. Type 2 diabetes mellitus with other circulatory complication, without long-term current use of insulin (HCC) We will check labs today.  Nael agreed to restart Ozempic 1 mg once weekly, with no refills.  He may be out of the donut hole now, but he will also look into patient assistance for Ozempic.  - CMP14+EGFR - Insulin, random - Hemoglobin A1c - CBC with Differential/Platelet - Microalbumin / creatinine urine ratio  2. Other hyperlipidemia We  will check labs today. Rashaad will continue to work on diet, exercise and weight loss efforts. Orders and follow up as documented in patient record.   - Lipid Panel With LDL/HDL Ratio - TSH  3. Vitamin D deficiency We will check labs today, and we will refill prescription Vitamin D 50,000 IU every 14 days for 1 month. [name] [name] will follow-up for routine testing of Vitamin D, at least 2-3 times per year to avoid over-replacement.  - VITAMIN D 25 Hydroxy (Vit-D Deficiency, Fractures) - Vitamin D, Ergocalciferol, (DRISDOL) 1.25 MG (50000 UNIT) CAPS capsule; Take 1 capsule (50,000 Units total) by mouth every 14 (fourteen) days.  Dispense: 2 capsule; Refill: 0  4. Obesity, Current BMI 41.2 [name] [name] is currently in the action stage of change. As such, his goal is to continue with weight loss efforts. He has agreed to the Category 3 Plan.   Behavioral modification strategies: meal planning and cooking strategies.  [name] [name] has agreed to follow-up with our clinic in 3 weeks. He was informed of the importance of frequent follow-up visits to maximize his success with intensive lifestyle modifications for his multiple health conditions.   [name] [name] was informed we would discuss his lab results at his next visit unless there is a critical issue that needs to be addressed sooner. [name] [name] agreed to keep his next visit at the agreed upon time to discuss these results.  Objective:   Blood pressure 123/69, pulse 68, temperature 98 F (36.7 C), height _0  (1.854 m), weight [name])  312 lb (141.5 kg), SpO2 95 %. Body mass index is 41.16 kg/m.  General: Cooperative, alert, well developed, in no acute distress. HEENT: Conjunctivae and lids unremarkable. Cardiovascular: Regular rhythm.  Lungs: Normal work of breathing. Neurologic: No focal deficits.   Lab Results  Component Value Date   CREATININE 1.23 03/07/2022   BUN 22 03/07/2022   NA 137 03/07/2022   K 4.3 03/07/2022   CL 99 03/07/2022   CO2 24 03/07/2022    Lab Results  Component Value Date   ALT 39 03/07/2022   AST 21 03/07/2022   ALKPHOS 69 03/07/2022   BILITOT 0.6 03/07/2022   Lab Results  Component Value Date   HGBA1C 10.1 (H) 03/07/2022   HGBA1C 10.9 (H) 02/07/2022   HGBA1C 6.4 09/14/2021   HGBA1C 6.3 (H) 02/22/2021   HGBA1C 6.6 (H) 09/16/2020   Lab Results  Component Value Date   INSULIN 22.7 03/07/2022   INSULIN 12.8 02/07/2022   INSULIN 11.7 02/22/2021   INSULIN 9.1 09/16/2020   INSULIN 10.0 08/14/2019   Lab Results  Component Value Date   TSH 1.260 03/07/2022   Lab Results  Component Value Date   CHOL 115 03/07/2022   HDL 38 (L) 03/07/2022   LDLCALC 57 03/07/2022   LDLDIRECT 148 (H) 11/15/2007   TRIG 108 03/07/2022   CHOLHDL 3.8 01/11/2022   Lab Results  Component Value Date   VD25OH 60.9 03/07/2022   VD25OH 60.3 02/07/2022   VD25OH 70.4 02/22/2021   Lab Results  Component Value Date   WBC 6.5 03/07/2022   HGB 15.8 03/07/2022   HCT 48.7 03/07/2022   MCV 87 03/07/2022   PLT 254 03/07/2022   No results found for: "IRON", "TIBC", "FERRITIN"  Attestation Statements:   Reviewed by clinician on day of visit: allergies, medications, problem list, medical history, surgical history, family history, social history, and previous encounter notes.   I, Trixie Dredge, am acting as transcriptionist for [name], MD.  I have reviewed the above documentation for accuracy and completeness, and I agree with the above. -  [name], MD

## 2022-03-16 ENCOUNTER — Ambulatory Visit (INDEPENDENT_AMBULATORY_CARE_PROVIDER_SITE_OTHER): Payer: Medicare PPO | Admitting: Student

## 2022-03-16 ENCOUNTER — Encounter: Payer: Self-pay | Admitting: Student

## 2022-03-16 VITALS — BP 149/75 | HR 63 | Ht 73.0 in | Wt 319.0 lb

## 2022-03-16 DIAGNOSIS — I6522 Occlusion and stenosis of left carotid artery: Secondary | ICD-10-CM | POA: Diagnosis not present

## 2022-03-16 DIAGNOSIS — I10 Essential (primary) hypertension: Secondary | ICD-10-CM

## 2022-03-16 DIAGNOSIS — E7849 Other hyperlipidemia: Secondary | ICD-10-CM

## 2022-03-16 DIAGNOSIS — F3289 Other specified depressive episodes: Secondary | ICD-10-CM | POA: Diagnosis not present

## 2022-03-16 DIAGNOSIS — E1159 Type 2 diabetes mellitus with other circulatory complications: Secondary | ICD-10-CM

## 2022-03-16 MED ORDER — METOPROLOL SUCCINATE ER 25 MG PO TB24: 12.5000 mg | ORAL_TABLET | Freq: Every day | ORAL | 0 refills | Status: DC

## 2022-03-16 MED ORDER — CLOPIDOGREL BISULFATE 75 MG PO TABS
75.0000 mg | ORAL_TABLET | Freq: Every day | ORAL | 2 refills | Status: DC
Start: 2022-03-16 — End: 2022-09-20

## 2022-03-16 MED ORDER — GLIMEPIRIDE 2 MG PO TABS: 2.0000 mg | ORAL_TABLET | Freq: Every day | ORAL | 1 refills | Status: DC

## 2022-03-16 MED ORDER — ESCITALOPRAM OXALATE 20 MG PO TABS: 20.0000 mg | ORAL_TABLET | Freq: Every day | ORAL | 5 refills | Status: DC

## 2022-03-16 MED ORDER — FUROSEMIDE 40 MG PO TABS: 40.0000 mg | ORAL_TABLET | Freq: Every day | ORAL | 3 refills | Status: DC

## 2022-03-16 MED ORDER — ROSUVASTATIN CALCIUM 40 MG PO TABS
ORAL_TABLET | ORAL | 0 refills | Status: DC
Start: 2022-03-16 — End: 2022-07-28

## 2022-03-16 NOTE — Assessment & Plan Note (Signed)
Recent A1c a week ago improved to 10.1.  Reports good tolerance to his medication and endorses compliance. -Continue current treatment regimen -Recommend keeping daily logs of blood glucose -Will consider switching your Glimepiride to SGLT2 at next visit. -Follow-up in 6 weeks

## 2022-03-16 NOTE — Patient Instructions (Addendum)
It was wonderful to meet you today. Thank you for allowing me to be a part of your care. Below is a short summary of what we discussed at your visit today:  Although your A1c is elevated, good to see it is improving.  Follow up in 3 months to recheck your A1c  Keep a log of your blood glucose reading at home and follow up in 6 weeks. Will consider switching your Glimepiride to Sotagliflozin at that time.  If you have any questions or concerns, please do not hesitate to contact us via phone or MyChart message.   Alen Bleacher, MD [facility]

## 2022-03-16 NOTE — Assessment & Plan Note (Signed)
Patient is status post DES of LAD back in March 2023.  Denies any chest pain currently.  [facility] outpatient and currently on metoprolol 12.5 mg daily, aspirin 81 mg daily, clopidogrel 75 mg daily, rosuvastatin 40 mg daily. -Continue home medication -Refilled medications -Per patient supposed to continue on Plavix and 5 mg daily for at least a year.

## 2022-03-16 NOTE — Progress Notes (Signed)
    SUBJECTIVE:   CHIEF COMPLAINT / HPI:   Diabetes Patient's current diabetic medications include glimepiride 2 mg daily, Ozempic 1 mg weekly and metformin 1000 mg daily.  Tolerating well without side effects.  Patient endorses compliance with these medications. CBG readings averaging in the 150- 165 range.  Patient's last A1c was 10.1 a week ago. Denies abdominal pain, blurred vision, polyuria, polydipsia, hypoglycemia. Patient states they understand that diet and exercise can help with her diabetes.  Patient is currently being followed by weight loss/ obesity clinic for 2 years now and has lost 45lbs so far. Restarted on Ozempic recently and about to start his first shot soon.    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 Started on plavix and aspirin per cardiology which he should continue for a year.    PERTINENT  PMH / PSH: TN, T2DM, CAD, HFimEF  OBJECTIVE:   BP (!) 149/75   Pulse 63   Ht '6\' 1"'$  (1.854 m)   Wt (!) 319 lb (144.7 kg)   SpO2 98%   BMI 42.09 kg/m    Physical Exam General: Alert, well appearing, NAD Cardiovascular: RRR, No Murmurs, Normal S2/S2 Respiratory: CTAB, No wheezing or Rales Extremities: No edema on extremities     ASSESSMENT/PLAN:   Diabetes mellitus (HCC) Recent A1c a week ago improved to 10.1.  Reports good tolerance to his medication and endorses compliance. -Continue current treatment regimen -Recommend keeping daily logs of blood glucose -Will consider switching your Glimepiride to SGLT2 at next visit. 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  Denies any chest pain currently.  [facility] and currently on metoprolol 12.5 mg daily, aspirin 81 mg daily, clopidogrel 75 mg daily, rosuvastatin 40 mg daily. -Continue home medication -Refilled medications -Per patient supposed to  continue on Plavix and 5 mg daily for at least a year.     Alen Bleacher, MD [place]

## 2022-03-31 ENCOUNTER — Telehealth (INDEPENDENT_AMBULATORY_CARE_PROVIDER_SITE_OTHER): Payer: Medicare PPO | Admitting: Family Medicine

## 2022-03-31 ENCOUNTER — Encounter (INDEPENDENT_AMBULATORY_CARE_PROVIDER_SITE_OTHER): Payer: Self-pay | Admitting: Family Medicine

## 2022-03-31 DIAGNOSIS — Z7985 Long-term (current) use of injectable non-insulin antidiabetic drugs: Secondary | ICD-10-CM | POA: Diagnosis not present

## 2022-03-31 DIAGNOSIS — E669 Obesity, unspecified: Secondary | ICD-10-CM

## 2022-03-31 DIAGNOSIS — Z6841 Body Mass Index (BMI) 40.0 and over, adult: Secondary | ICD-10-CM | POA: Diagnosis not present

## 2022-03-31 DIAGNOSIS — E1159 Type 2 diabetes mellitus with other circulatory complications: Secondary | ICD-10-CM

## 2022-03-31 DIAGNOSIS — Z7984 Long term (current) use of oral hypoglycemic drugs: Secondary | ICD-10-CM | POA: Diagnosis not present

## 2022-03-31 DIAGNOSIS — E559 Vitamin D deficiency, unspecified: Secondary | ICD-10-CM

## 2022-03-31 MED ORDER — VITAMIN D (ERGOCALCIFEROL) 1.25 MG (50000 UNIT) PO CAPS: 50000.0000 [IU] | ORAL_CAPSULE | ORAL | 0 refills | Status: DC

## 2022-04-06 NOTE — Progress Notes (Signed)
TeleHealth Visit:  Due to the COVID-19 pandemic, this visit was completed with telemedicine (audio/video) technology to reduce patient and provider exposure as well as to preserve personal protective equipment.   [name] [name] has verbally consented to this TeleHealth visit. The patient is located at home, the provider is located at the [facility] office. The participants in this visit include the listed provider and patient. The visit was conducted today via MyChart video.   Chief Complaint: OBESITY [name] [name] is here to discuss his progress with his obesity treatment plan along with follow-up of his obesity related diagnoses. [name] [name] is on the Category 3 Plan and states he is following his eating plan approximately 85-90% of the time. [name] [name] states he is walking for 15 minutes 3-4 times per week.  Today's visit was #: 85 Starting weight: 340 lbs Starting date: 12/28/2016  Interim History: [name] [name] got overheated yesterday, but he has increased his liquids. He continues to work on his diet and weight loss. He thinks he has either stayed at the same weight or lost 2 lbs since his last visit.  Subjective:   1. Type 2 diabetes mellitus with other circulatory complication, without long-term current use of insulin (HCC) [name] [name] had patient assistance for Ozempic and he is getting ready to restart Amaryl. His fasting blood sugars range between 190-200. His A1c has significantly worsened.   2. Vitamin D deficiency [name] [name] is on Vitamin D, and he has no signs of over-replacement.   Assessment/Plan:   1. Type 2 diabetes mellitus with other circulatory complication, without long-term current use of insulin (HCC) [name] [name] is to start Ozempic at 0.5 mg once weekly, with no refill needed, and we will continue to follow.   2. Vitamin D deficiency We will refill prescription Vitamin D for 1 month. [name] [name] will follow-up for routine testing of Vitamin D, at least 2-3 times per year to avoid  over-replacement.  - Vitamin D, Ergocalciferol, (DRISDOL) 1.25 MG (50000 UNIT) CAPS capsule; Take 1 capsule (50,000 Units total) by mouth every 14 (fourteen) days.  Dispense: 2 capsule; Refill: 0  3. Obesity, Current BMI 41.2 [name] [name] is currently in the action stage of change. As such, his goal is to continue with weight loss efforts. He has agreed to the Category 3 Plan.   Exercise goals: As is.   Behavioral modification strategies: increasing lean protein intake.  [name] [name] has agreed to follow-up with our clinic in 3 weeks. He was informed of the importance of frequent follow-up visits to maximize his success with intensive lifestyle modifications for his multiple health conditions.  Objective:   VITALS: Per patient if applicable, see vitals. GENERAL: Alert and in no acute distress. CARDIOPULMONARY: No increased WOB. Speaking in clear sentences.  PSYCH: Pleasant and cooperative. Speech normal rate and rhythm. Affect is appropriate. Insight and judgement are appropriate. Attention is focused, linear, and appropriate.  NEURO: Oriented as arrived to appointment on time with no prompting.   Lab Results  Component Value Date   CREATININE 1.23 03/07/2022   BUN 22 03/07/2022   NA 137 03/07/2022   K 4.3 03/07/2022   CL 99 03/07/2022   CO2 24 03/07/2022   Lab Results  Component Value Date   ALT 39 03/07/2022   AST 21 03/07/2022   ALKPHOS 69 03/07/2022   BILITOT 0.6 03/07/2022   Lab Results  Component Value Date   HGBA1C 10.1 (H) 03/07/2022   HGBA1C 10.9 (H) 02/07/2022   HGBA1C 6.4 09/14/2021   HGBA1C 6.3 (H)  02/22/2021   HGBA1C 6.6 (H) 09/16/2020   Lab Results  Component Value Date   INSULIN 22.7 03/07/2022   INSULIN 12.8 02/07/2022   INSULIN 11.7 02/22/2021   INSULIN 9.1 09/16/2020   INSULIN 10.0 08/14/2019   Lab Results  Component Value Date   TSH 1.260 03/07/2022   Lab Results  Component Value Date   CHOL 115 03/07/2022   HDL 38 (L) 03/07/2022   LDLCALC 57  03/07/2022   LDLDIRECT 148 (H) 11/15/2007   TRIG 108 03/07/2022   CHOLHDL 3.8 01/11/2022   Lab Results  Component Value Date   VD25OH 60.9 03/07/2022   VD25OH 60.3 02/07/2022   VD25OH 70.4 02/22/2021   Lab Results  Component Value Date   WBC 6.5 03/07/2022   HGB 15.8 03/07/2022   HCT 48.7 03/07/2022   MCV 87 03/07/2022   PLT 254 03/07/2022   No results found for: "IRON", "TIBC", "FERRITIN"  Attestation Statements:   Reviewed by clinician on day of visit: allergies, medications, problem list, medical history, surgical history, family history, social history, and previous encounter notes.   I, Trixie Dredge, am acting as transcriptionist for [name], MD.  I have reviewed the above documentation for accuracy and completeness, and I agree with the above. - [name], MD

## 2022-04-15 DIAGNOSIS — S0010XA Contusion of unspecified eyelid and periocular area, initial encounter: Secondary | ICD-10-CM | POA: Diagnosis not present

## 2022-04-15 DIAGNOSIS — Z961 Presence of intraocular lens: Secondary | ICD-10-CM | POA: Diagnosis not present

## 2022-04-15 DIAGNOSIS — H0102A Squamous blepharitis right eye, upper and lower eyelids: Secondary | ICD-10-CM | POA: Diagnosis not present

## 2022-04-15 DIAGNOSIS — H34212 Partial retinal artery occlusion, left eye: Secondary | ICD-10-CM | POA: Diagnosis not present

## 2022-04-15 DIAGNOSIS — G4733 Obstructive sleep apnea (adult) (pediatric): Secondary | ICD-10-CM | POA: Diagnosis not present

## 2022-04-15 DIAGNOSIS — H0102B Squamous blepharitis left eye, upper and lower eyelids: Secondary | ICD-10-CM | POA: Diagnosis not present

## 2022-04-15 DIAGNOSIS — E119 Type 2 diabetes mellitus without complications: Secondary | ICD-10-CM | POA: Diagnosis not present

## 2022-04-15 DIAGNOSIS — H43811 Vitreous degeneration, right eye: Secondary | ICD-10-CM | POA: Diagnosis not present

## 2022-04-18 ENCOUNTER — Ambulatory Visit: Payer: Medicare PPO | Attending: Physician Assistant

## 2022-04-18 DIAGNOSIS — E785 Hyperlipidemia, unspecified: Secondary | ICD-10-CM | POA: Diagnosis not present

## 2022-04-18 DIAGNOSIS — L821 Other seborrheic keratosis: Secondary | ICD-10-CM | POA: Diagnosis not present

## 2022-04-18 DIAGNOSIS — D1801 Hemangioma of skin and subcutaneous tissue: Secondary | ICD-10-CM | POA: Diagnosis not present

## 2022-04-18 DIAGNOSIS — L570 Actinic keratosis: Secondary | ICD-10-CM | POA: Diagnosis not present

## 2022-04-18 DIAGNOSIS — L814 Other melanin hyperpigmentation: Secondary | ICD-10-CM | POA: Diagnosis not present

## 2022-04-18 DIAGNOSIS — D2362 Other benign neoplasm of skin of left upper limb, including shoulder: Secondary | ICD-10-CM | POA: Diagnosis not present

## 2022-04-18 DIAGNOSIS — D2371 Other benign neoplasm of skin of right lower limb, including hip: Secondary | ICD-10-CM | POA: Diagnosis not present

## 2022-04-18 DIAGNOSIS — L281 Prurigo nodularis: Secondary | ICD-10-CM | POA: Diagnosis not present

## 2022-04-18 DIAGNOSIS — I5022 Chronic systolic (congestive) heart failure: Secondary | ICD-10-CM | POA: Diagnosis not present

## 2022-04-18 DIAGNOSIS — Z85828 Personal history of other malignant neoplasm of skin: Secondary | ICD-10-CM | POA: Diagnosis not present

## 2022-04-18 DIAGNOSIS — D235 Other benign neoplasm of skin of trunk: Secondary | ICD-10-CM | POA: Diagnosis not present

## 2022-04-18 LAB — LIPID PANEL
Chol/HDL Ratio: 3 ratio (ref 0.0–5.0)
Cholesterol, Total: 113 mg/dL (ref 100–199)
HDL: 38 mg/dL — ABNORMAL LOW (ref >39)
LDL Chol Calc (NIH): 58 mg/dL (ref 0–99)
Triglycerides: 83 mg/dL (ref 0–149)
VLDL Cholesterol Cal: 17 mg/dL (ref 5–40)

## 2022-04-18 LAB — ALT: ALT: 35 IU/L (ref 0–44)

## 2022-04-19 ENCOUNTER — Ambulatory Visit (INDEPENDENT_AMBULATORY_CARE_PROVIDER_SITE_OTHER): Payer: Medicare PPO

## 2022-04-19 DIAGNOSIS — Z23 Encounter for immunization: Secondary | ICD-10-CM

## 2022-04-21 ENCOUNTER — Encounter (INDEPENDENT_AMBULATORY_CARE_PROVIDER_SITE_OTHER): Payer: Self-pay | Admitting: Family Medicine

## 2022-04-21 ENCOUNTER — Ambulatory Visit (INDEPENDENT_AMBULATORY_CARE_PROVIDER_SITE_OTHER): Payer: Medicare PPO | Admitting: Family Medicine

## 2022-04-21 VITALS — BP 160/84 | HR 70 | Temp 97.9°F | Ht 73.0 in | Wt 314.0 lb

## 2022-04-21 DIAGNOSIS — I10 Essential (primary) hypertension: Secondary | ICD-10-CM

## 2022-04-21 DIAGNOSIS — E1169 Type 2 diabetes mellitus with other specified complication: Secondary | ICD-10-CM | POA: Diagnosis not present

## 2022-04-21 DIAGNOSIS — Z6841 Body Mass Index (BMI) 40.0 and over, adult: Secondary | ICD-10-CM | POA: Diagnosis not present

## 2022-04-21 DIAGNOSIS — F439 Reaction to severe stress, unspecified: Secondary | ICD-10-CM | POA: Diagnosis not present

## 2022-04-21 DIAGNOSIS — E669 Obesity, unspecified: Secondary | ICD-10-CM | POA: Diagnosis not present

## 2022-04-21 MED ORDER — ACCU-CHEK AVIVA PLUS VI STRP: ORAL_STRIP | 3 refills | Status: AC

## 2022-04-26 ENCOUNTER — Ambulatory Visit: Payer: Medicare PPO | Admitting: Student

## 2022-04-26 NOTE — Progress Notes (Unsigned)
Chief Complaint:   OBESITY [name] [name] is here to discuss his progress with his obesity treatment plan along with follow-up of his obesity related diagnoses. [name] [name] is on the Category 3 Plan and states he is following his eating plan approximately 80% of the time. [name] [name] states he is walking around the yard for 30 minutes 3-4 times per week.  Today's visit was #: 71 Starting weight: 340 lbs Starting date: 12/28/2016 Today's weight: 314 lbs Today's date: 04/21/2022 Total lbs lost to date: 26 Total lbs lost since last in-office visit: 0  Interim History: [name] [name] has done a lot of traveling and he had to eat out more, but he did well with getting his protein in. His weight gain appears to be more muscle than fat.   Subjective:   1. Stress [name] [name] is dealing with work stress, but it appears this may improve soon.   2. Essential hypertension [name] [name]'s blood pressure is elevated today, but usually better controlled. He denies chest pain or headache.   3. Type 2 diabetes mellitus with other specified complication, unspecified whether long term insulin use (HCC) [name] [name]'s fasting blood sugars mostly range between 160-200's. He is working on getting patient assistance to be able to afford his GLP-1 again.   Assessment/Plan:   1. Stress [name] [name] was offered support and encouragement.   2. Essential hypertension [name] [name] is to continue with his diet, exercise, and medications and we will recheck his blood pressure in 3-4 weeks.   3. Type 2 diabetes mellitus with other specified complication, unspecified whether long term insulin use (HCC) We will refill Accuchek strips TID with 3 refill.   - glucose blood (ACCU-CHEK AVIVA PLUS) test strip; USE TO CHECK BLOOD SUGAR TWICE DAILY.  Dispense: 100 strip; Refill: 3  4. Obesity,current BMI 41.5 [name] [name] is currently in the action stage of change. As such, his goal is to continue with weight loss efforts. He has agreed to the Category 3 Plan.   Exercise  goals: As is.   Behavioral modification strategies: increasing lean protein intake and increasing water intake.  [name] [name] has agreed to follow-up with our clinic in 3 to 4 weeks. He was informed of the importance of frequent follow-up visits to maximize his success with intensive lifestyle modifications for his multiple health conditions.   Objective:   Blood pressure (!) 160/84, pulse 70, temperature 97.9 F (36.6 C), height '6\' 1"'$  (1.854 m), weight (!) 314 lb (142.4 kg), SpO2 96 %. Body mass index is 41.43 kg/m.  General: Cooperative, alert, well developed, in no acute distress. HEENT: Conjunctivae and lids unremarkable. Cardiovascular: Regular rhythm.  Lungs: Normal work of breathing. Neurologic: No focal deficits.   Lab Results  Component Value Date   CREATININE 1.23 03/07/2022   BUN 22 03/07/2022   NA 137 03/07/2022   K 4.3 03/07/2022   CL 99 03/07/2022   CO2 24 03/07/2022   Lab Results  Component Value Date   ALT 35 04/18/2022   AST 21 03/07/2022   ALKPHOS 69 03/07/2022   BILITOT 0.6 03/07/2022   Lab Results  Component Value Date   HGBA1C 10.1 (H) 03/07/2022   HGBA1C 10.9 (H) 02/07/2022   HGBA1C 6.4 09/14/2021   HGBA1C 6.3 (H) 02/22/2021   HGBA1C 6.6 (H) 09/16/2020   Lab Results  Component Value Date   INSULIN 22.7 03/07/2022   INSULIN 12.8 02/07/2022   INSULIN 11.7 02/22/2021   INSULIN 9.1 09/16/2020   INSULIN 10.0 08/14/2019   Lab Results  Component Value  Date   TSH 1.260 03/07/2022   Lab Results  Component Value Date   CHOL 113 04/18/2022   HDL 38 (L) 04/18/2022   LDLCALC 58 04/18/2022   LDLDIRECT 148 (H) 11/15/2007   TRIG 83 04/18/2022   CHOLHDL 3.0 04/18/2022   Lab Results  Component Value Date   VD25OH 60.9 03/07/2022   VD25OH 60.3 02/07/2022   VD25OH 70.4 02/22/2021   Lab Results  Component Value Date   WBC 6.5 03/07/2022   HGB 15.8 03/07/2022   HCT 48.7 03/07/2022   MCV 87 03/07/2022   PLT 254 03/07/2022   No results found for:  "IRON", "TIBC", "FERRITIN"  Obesity Behavioral Intervention:   Approximately 15 minutes were spent on the discussion below.  ASK: We discussed the diagnosis of obesity with [name] [name] today and [name] [name] agreed to give [name] permission to discuss obesity behavioral modification therapy today.  ASSESS: [name] [name] has the diagnosis of obesity and his BMI today is 41.5. [name] [name] is in the action stage of change.   ADVISE: [name] [name] was educated on the multiple health risks of obesity as well as the benefit of weight loss to improve his health. He was advised of the need for long term treatment and the importance of lifestyle modifications to improve his current health and to decrease his risk of future health problems.  AGREE: Multiple dietary modification options and treatment options were discussed and [name] [name] agreed to follow the recommendations documented in the above note.  ARRANGE: [name] [name] was educated on the importance of frequent visits to treat obesity as outlined per CMS and USPSTF guidelines and agreed to schedule his next follow up appointment today.  Attestation Statements:   Reviewed by clinician on day of visit: allergies, medications, problem list, medical history, surgical history, family history, social history, and previous encounter notes.   I, Trixie Dredge, am acting as transcriptionist for [name], MD.  I have reviewed the above documentation for accuracy and completeness, and I agree with the above. -  [name], MD

## 2022-05-06 DIAGNOSIS — Z8546 Personal history of malignant neoplasm of prostate: Secondary | ICD-10-CM | POA: Diagnosis not present

## 2022-05-06 DIAGNOSIS — N3946 Mixed incontinence: Secondary | ICD-10-CM | POA: Diagnosis not present

## 2022-05-09 ENCOUNTER — Other Ambulatory Visit (INDEPENDENT_AMBULATORY_CARE_PROVIDER_SITE_OTHER): Payer: Self-pay | Admitting: Family Medicine

## 2022-05-09 DIAGNOSIS — E559 Vitamin D deficiency, unspecified: Secondary | ICD-10-CM

## 2022-05-09 NOTE — Progress Notes (Addendum)
    SUBJECTIVE:   CHIEF COMPLAINT / HPI:   Diabetes Patient's current diabetic medications include metformin, glimepiride and ozempic.  Tolerating well without side effects.  Patient endorses compliance with these medications. CBG readings averaging in the 170-200 range.  Patient's last A1c was 10.1 Denies abdominal pain, blurred vision, polyuria, polydipsia, hypoglycemia. Patient states they understand that diet and exercise can help with her diabetes.  Annual foot and eye exam is up to date   PERTINENT  PMH / Bee: Reviewed  OBJECTIVE:   BP 136/73   Pulse 69   Wt (!) 323 lb (146.5 kg)   SpO2 98%   BMI 42.61 kg/m    Physical Exam General: Alert, well appearing, NAD Cardiovascular: Regular rate, Well perfused Respirator: Normal WOB on RA  Extremities: No edema on extremities     ASSESSMENT/PLAN:   Type 2 diabetes mellitus with other specified complication ([place]) Patient's diabetes is poorly controlled with last A1c of 10.4 and daily blood glucose has ranged in 170-200's. Endorses compliance and tolerance to medication without side effect. Given his blood glucose is still not well controlled on current medication will increase his metformin to 1000 mg BID. Discussed continued exercise and proper dieting. Follow up in a month to ensure tolerance to medication and reassess blood glucose reading.      Alen Bleacher, MD [place]

## 2022-05-10 ENCOUNTER — Encounter: Payer: Self-pay | Admitting: Student

## 2022-05-10 ENCOUNTER — Encounter (INDEPENDENT_AMBULATORY_CARE_PROVIDER_SITE_OTHER): Payer: Self-pay | Admitting: Family Medicine

## 2022-05-10 ENCOUNTER — Ambulatory Visit (INDEPENDENT_AMBULATORY_CARE_PROVIDER_SITE_OTHER): Payer: Medicare PPO | Admitting: Student

## 2022-05-10 ENCOUNTER — Ambulatory Visit (INDEPENDENT_AMBULATORY_CARE_PROVIDER_SITE_OTHER): Payer: Medicare PPO | Admitting: Family Medicine

## 2022-05-10 VITALS — BP 131/73 | HR 66 | Temp 97.8°F | Ht 73.0 in | Wt 316.0 lb

## 2022-05-10 DIAGNOSIS — E559 Vitamin D deficiency, unspecified: Secondary | ICD-10-CM | POA: Diagnosis not present

## 2022-05-10 DIAGNOSIS — F4320 Adjustment disorder, unspecified: Secondary | ICD-10-CM | POA: Diagnosis not present

## 2022-05-10 DIAGNOSIS — Z6841 Body Mass Index (BMI) 40.0 and over, adult: Secondary | ICD-10-CM

## 2022-05-10 DIAGNOSIS — Z7985 Long-term (current) use of injectable non-insulin antidiabetic drugs: Secondary | ICD-10-CM

## 2022-05-10 DIAGNOSIS — Z7984 Long term (current) use of oral hypoglycemic drugs: Secondary | ICD-10-CM | POA: Diagnosis not present

## 2022-05-10 DIAGNOSIS — E1159 Type 2 diabetes mellitus with other circulatory complications: Secondary | ICD-10-CM | POA: Diagnosis not present

## 2022-05-10 DIAGNOSIS — E1169 Type 2 diabetes mellitus with other specified complication: Secondary | ICD-10-CM

## 2022-05-10 DIAGNOSIS — E669 Obesity, unspecified: Secondary | ICD-10-CM | POA: Diagnosis not present

## 2022-05-10 MED ORDER — VITAMIN D (ERGOCALCIFEROL) 1.25 MG (50000 UNIT) PO CAPS: 50000.0000 [IU] | ORAL_CAPSULE | ORAL | 0 refills | Status: DC

## 2022-05-10 MED ORDER — METFORMIN HCL 1000 MG PO TABS: 1000.0000 mg | ORAL_TABLET | Freq: Two times a day (BID) | ORAL | 3 refills | Status: DC

## 2022-05-10 MED ORDER — SEMAGLUTIDE (1 MG/DOSE) 4 MG/3ML ~~LOC~~ SOPN: 1.0000 mg | PEN_INJECTOR | SUBCUTANEOUS | 0 refills | Status: DC

## 2022-05-10 NOTE — Assessment & Plan Note (Addendum)
Patient's diabetes is poorly controlled with last A1c of 10.4 and daily blood glucose has ranged in 170-200's. Endorses compliance and tolerance to medication without side effect. Given his blood glucose is still not well controlled on current medication will increase his metformin to 1000 mg BID. Discussed continued exercise and proper dieting. Follow up in a month to ensure tolerance to medication and reassess blood glucose reading.

## 2022-05-10 NOTE — Patient Instructions (Signed)
It was wonderful to aee you today. Thank you for allowing me to be a part of your care. Below is a short summary of what we discussed at your visit today:  Your Blood sugar is still elevated and lets increase your metformin to 2 times daily. One in morning and one at night.  Follow up in a month to make sure you are tolerating it and your blood glucose is coming down  Please bring all of your medications to every appointment!  If you have any questions or concerns, please do not hesitate to contact us via phone or MyChart message.   Alen Bleacher, MD [facility]

## 2022-05-11 NOTE — Progress Notes (Signed)
Chief Complaint:   OBESITY [name] [name] is here to discuss his progress with his obesity treatment plan along with follow-up of his obesity related diagnoses. [name] [name] is on the Category 3 Plan and states he is following his eating plan approximately 75-80% of the time. [name] [name] states he is walking for 20 minutes 3-4 times per week.  Today's visit was #: 39 Starting weight: 340 lbs Starting date: 12/28/2016 Today's weight: 316 lbs Today's date: 05/10/22 Total lbs lost to date: 24 Total lbs lost since last in-office visit: +2  Interim History: He is stressed due to medical issues-told he might need radiation for prostate cancer.  He lacks a good support system.  He is doing fairly well with the meal plan.  He denies stress eating.  He sleeps well.  He lacks water intake.  He is taking a 2-week trip to [place] on his motorcycle to see his brothers.  Subjective:   1. Type 2 diabetes mellitus with other circulatory complication, without long-term current use of insulin (HCC) Off Ozempic 1 mg due to cost.  Has pens at home. On glimepiride and metformin-increase to 1000 mg twice daily. 03/07/2022 A1c 10.1.  2. Vitamin D deficiency On prescription vitamin D 50,000 IU every 14 days. 03/07/2022 vitamin D level 60.9.  3. Adjustment disorder, unspecified type Worry about health problems has increased.  Lacks good support here but is sleeping well.   Assessment/Plan:   1. Type 2 diabetes mellitus with other circulatory complication, without long-term current use of insulin (HCC) Restart [id number]-(50 clicks equals 0.5 mg dose x4 weeks) then increase to 1 mg once weekly. Refilled: - Semaglutide, 1 MG/DOSE, 4 MG/3ML SOPN; Inject 1 mg as directed once a week.  Dispense: 3 mL; Refill: 0  2. Vitamin D deficiency Refilled: - Vitamin D, Ergocalciferol, (DRISDOL) 1.25 MG (50000 UNIT) CAPS capsule; Take 1 capsule (50,000 Units total) by mouth every 14 (fourteen) days.  Dispense: 2 capsule; Refill:  0  3. Adjustment disorder, unspecified type Has trip to see brothers this week. Discussed importance of stress reduction.  4. Obesity,current BMI 41.8 [name] is currently in the action stage of change. As such, his goal is to continue with weight loss efforts. He has agreed to the Category 3 Plan.   Exercise goals: Ride bike-outdoor/indoors.  Behavioral modification strategies: increasing lean protein intake, increasing water intake, decreasing eating out, no skipping meals, meal planning and cooking strategies, keeping healthy foods in the home, and decreasing junk food.  Danner has agreed to follow-up with our clinic in 3-4 weeks. He was informed of the importance of frequent follow-up visits to maximize his success with intensive lifestyle modifications for his multiple health conditions.    Objective:   Blood pressure 131/73, pulse 66, temperature 97.8 F (36.6 C), height '6\' 1"'$  (1.854 m), weight (!) 316 lb (143.3 kg), SpO2 96 %. Body mass index is 41.69 kg/m.  General: Cooperative, alert, well developed, in no acute distress. HEENT: Conjunctivae and lids unremarkable. Cardiovascular: Regular rhythm.  Lungs: Normal work of breathing. Neurologic: No focal deficits.   Lab Results  Component Value Date   CREATININE 1.23 03/07/2022   BUN 22 03/07/2022   NA 137 03/07/2022   K 4.3 03/07/2022   CL 99 03/07/2022   CO2 24 03/07/2022   Lab Results  Component Value Date   ALT 35 04/18/2022   AST 21 03/07/2022   ALKPHOS 69 03/07/2022   BILITOT 0.6 03/07/2022   Lab Results  Component Value Date  HGBA1C 10.1 (H) 03/07/2022   HGBA1C 10.9 (H) 02/07/2022   HGBA1C 6.4 09/14/2021   HGBA1C 6.3 (H) 02/22/2021   HGBA1C 6.6 (H) 09/16/2020   Lab Results  Component Value Date   INSULIN 22.7 03/07/2022   INSULIN 12.8 02/07/2022   INSULIN 11.7 02/22/2021   INSULIN 9.1 09/16/2020   INSULIN 10.0 08/14/2019   Lab Results  Component Value Date   TSH 1.260 03/07/2022   Lab  Results  Component Value Date   CHOL 113 04/18/2022   HDL 38 (L) 04/18/2022   LDLCALC 58 04/18/2022   LDLDIRECT 148 (H) 11/15/2007   TRIG 83 04/18/2022   CHOLHDL 3.0 04/18/2022   Lab Results  Component Value Date   VD25OH 60.9 03/07/2022   VD25OH 60.3 02/07/2022   VD25OH 70.4 02/22/2021   Lab Results  Component Value Date   WBC 6.5 03/07/2022   HGB 15.8 03/07/2022   HCT 48.7 03/07/2022   MCV 87 03/07/2022   PLT 254 03/07/2022   No results found for: "IRON", "TIBC", "FERRITIN"   Attestation Statements:   Reviewed by clinician on day of visit: allergies, medications, problem list, medical history, surgical history, family history, social history, and previous encounter notes.  I, Georgianne Fick, FNP, am acting as transcriptionist for Dr. Loyal Gambler.  I have reviewed the above documentation for accuracy and completeness, and I agree with the above. Dell Ponto, DO

## 2022-05-19 ENCOUNTER — Ambulatory Visit (INDEPENDENT_AMBULATORY_CARE_PROVIDER_SITE_OTHER): Payer: Medicare PPO | Admitting: Family Medicine

## 2022-06-07 ENCOUNTER — Ambulatory Visit: Payer: Self-pay | Admitting: Student

## 2022-06-09 ENCOUNTER — Ambulatory Visit (INDEPENDENT_AMBULATORY_CARE_PROVIDER_SITE_OTHER): Payer: Medicare PPO | Admitting: Family Medicine

## 2022-06-09 ENCOUNTER — Encounter (INDEPENDENT_AMBULATORY_CARE_PROVIDER_SITE_OTHER): Payer: Self-pay | Admitting: Family Medicine

## 2022-06-09 VITALS — BP 157/81 | HR 61 | Temp 98.0°F | Ht 73.0 in | Wt 316.0 lb

## 2022-06-09 DIAGNOSIS — Z6841 Body Mass Index (BMI) 40.0 and over, adult: Secondary | ICD-10-CM

## 2022-06-09 DIAGNOSIS — E1169 Type 2 diabetes mellitus with other specified complication: Secondary | ICD-10-CM

## 2022-06-09 DIAGNOSIS — Z7985 Long-term (current) use of injectable non-insulin antidiabetic drugs: Secondary | ICD-10-CM

## 2022-06-09 DIAGNOSIS — E66813 Obesity, class 3: Secondary | ICD-10-CM

## 2022-06-09 DIAGNOSIS — E559 Vitamin D deficiency, unspecified: Secondary | ICD-10-CM

## 2022-06-09 DIAGNOSIS — E669 Obesity, unspecified: Secondary | ICD-10-CM

## 2022-06-09 DIAGNOSIS — I10 Essential (primary) hypertension: Secondary | ICD-10-CM | POA: Diagnosis not present

## 2022-06-09 MED ORDER — VITAMIN D (ERGOCALCIFEROL) 1.25 MG (50000 UNIT) PO CAPS: 50000.0000 [IU] | ORAL_CAPSULE | ORAL | 0 refills | Status: DC

## 2022-06-22 NOTE — Progress Notes (Signed)
Chief Complaint:   OBESITY [name] [name] is here to discuss his progress with his obesity treatment plan along with follow-up of his obesity related diagnoses. [name] [name] is on the Category 3 Plan and states he is following his eating plan approximately 90% of the time. [name] [name] states he is walking for 20 minutes 4 times per week.  Today's visit was #: 21 Starting weight: 340 lbs Starting date: 12/28/2016 Today's weight: 316 lbs Today's date: 06/09/2022 Total lbs lost to date: 24 Total lbs lost since last in-office visit: 0  Interim History: [name] [name] did a lot of traveling since his last visit, and he had challenges with following his plan. He was mindful of his choices and tried to minimize the breads and pasta.   Subjective:   1. Vitamin D deficiency [name] [name] is stable on Vitamin D with no side effects noted. He requests a refill today.   2. Type 2 diabetes mellitus with other specified complication, without long-term current use of insulin (HCC) [name] [name] recently got his Ozempic, but she hasn't started it yet due to traveling. His fasting blood sugars mostly range between 150-200, and his last A1c was elevated at 10. He was better controlled previously while on the medications and he is working on getting this through patient assistance.   3. Essential hypertension [name] [name]'s blood pressure is elevated today. He is working on his diet, exercise, and weight loss. He is on medications and his blood pressure is normally better controlled.   Assessment/Plan:   1. Vitamin D deficiency We will refill prescription Vitamin D 50,000 IU every 14 days for 1 month. [name] [name] will follow-up for routine testing of Vitamin D, at least 2-3 times per year to avoid over-replacement.  - Vitamin D, Ergocalciferol, (DRISDOL) 1.25 MG (50000 UNIT) CAPS capsule; Take 1 capsule (50,000 Units total) by mouth every 14 (fourteen) days.  Dispense: 2 capsule; Refill: 0  2. Type 2 diabetes mellitus with other specified  complication, without long-term current use of insulin ([place]) [name] [name] agreed to restart Ozempic at 0.5 mg once weekly until his next visit in 3 weeks.   3. Essential hypertension We will recheck [name] [name]'s blood pressure in 3 weeks.   4. Obesity,current BMI 41.7 [name] [name] is currently in the action stage of change. As such, his goal is to continue with weight loss efforts. He has agreed to the Category 3 Plan.   Exercise goals: As is.   Behavioral modification strategies: increasing lean protein intake and travel eating strategies.  [name] [name] has agreed to follow-up with our clinic in 3 weeks. He was informed of the importance of frequent follow-up visits to maximize his success with intensive lifestyle modifications for his multiple health conditions.   Objective:   Blood pressure (!) 157/81, pulse 61, temperature 98 F (36.7 C), height '6\' 1"'$  (1.854 m), weight (!) 316 lb (143.3 kg), SpO2 98 %. Body mass index is 41.69 kg/m.  General: Cooperative, alert, well developed, in no acute distress. HEENT: Conjunctivae and lids unremarkable. Cardiovascular: Regular rhythm.  Lungs: Normal work of breathing. Neurologic: No focal deficits.   Lab Results  Component Value Date   CREATININE 1.23 03/07/2022   BUN 22 03/07/2022   NA 137 03/07/2022   K 4.3 03/07/2022   CL 99 03/07/2022   CO2 24 03/07/2022   Lab Results  Component Value Date   ALT 35 04/18/2022   AST 21 03/07/2022   ALKPHOS 69 03/07/2022   BILITOT 0.6 03/07/2022   Lab Results  Component Value Date  HGBA1C 10.1 (H) 03/07/2022   HGBA1C 10.9 (H) 02/07/2022   HGBA1C 6.4 09/14/2021   HGBA1C 6.3 (H) 02/22/2021   HGBA1C 6.6 (H) 09/16/2020   Lab Results  Component Value Date   INSULIN 22.7 03/07/2022   INSULIN 12.8 02/07/2022   INSULIN 11.7 02/22/2021   INSULIN 9.1 09/16/2020   INSULIN 10.0 08/14/2019   Lab Results  Component Value Date   TSH 1.260 03/07/2022   Lab Results  Component Value Date   CHOL 113 04/18/2022    HDL 38 (L) 04/18/2022   LDLCALC 58 04/18/2022   LDLDIRECT 148 (H) 11/15/2007   TRIG 83 04/18/2022   CHOLHDL 3.0 04/18/2022   Lab Results  Component Value Date   VD25OH 60.9 03/07/2022   VD25OH 60.3 02/07/2022   VD25OH 70.4 02/22/2021   Lab Results  Component Value Date   WBC 6.5 03/07/2022   HGB 15.8 03/07/2022   HCT 48.7 03/07/2022   MCV 87 03/07/2022   PLT 254 03/07/2022   No results found for: "IRON", "TIBC", "FERRITIN"  Attestation Statements:   Reviewed by clinician on day of visit: allergies, medications, problem list, medical history, surgical history, family history, social history, and previous encounter notes.   I, Trixie Dredge, am acting as transcriptionist for [name], MD.  I have reviewed the above documentation for accuracy and completeness, and I agree with the above. -  [name], MD

## 2022-06-25 ENCOUNTER — Other Ambulatory Visit: Payer: Self-pay | Admitting: Student

## 2022-06-25 DIAGNOSIS — E1159 Type 2 diabetes mellitus with other circulatory complications: Secondary | ICD-10-CM

## 2022-06-27 ENCOUNTER — Telehealth: Payer: Self-pay

## 2022-06-27 NOTE — Telephone Encounter (Signed)
Called and informed patient. No further questions at this time.   Talbot Grumbling, RN

## 2022-06-27 NOTE — Telephone Encounter (Signed)
Patient calls nurse line to ask if he should receive RSV vaccine.   Advised that we do not currently offer this vaccine here. Per CDC guidelines, vaccine can be administered to adults age greater than 41 with shared decision making from provider.   Will forward to PCP for further advisement.   Talbot Grumbling, RN

## 2022-06-30 ENCOUNTER — Ambulatory Visit (INDEPENDENT_AMBULATORY_CARE_PROVIDER_SITE_OTHER): Payer: Medicare PPO | Admitting: Family Medicine

## 2022-07-11 ENCOUNTER — Telehealth: Payer: Self-pay | Admitting: Student

## 2022-07-11 NOTE — Telephone Encounter (Signed)
[employer] has called to check status of forms that on 06/20/2022, 07/05/2022,07/07/2022. They are closed next week and are needing this form completed as soon as possible to help them out and also the patient. Forms are in doctors box.   Please advise.   Thanks!

## 2022-07-20 ENCOUNTER — Encounter (INDEPENDENT_AMBULATORY_CARE_PROVIDER_SITE_OTHER): Payer: Self-pay | Admitting: Family Medicine

## 2022-07-20 ENCOUNTER — Ambulatory Visit (INDEPENDENT_AMBULATORY_CARE_PROVIDER_SITE_OTHER): Payer: Medicare PPO | Admitting: Family Medicine

## 2022-07-20 VITALS — BP 146/78 | HR 69 | Temp 98.1°F | Ht 73.0 in | Wt 317.0 lb

## 2022-07-20 DIAGNOSIS — Z7985 Long-term (current) use of injectable non-insulin antidiabetic drugs: Secondary | ICD-10-CM | POA: Diagnosis not present

## 2022-07-20 DIAGNOSIS — E1159 Type 2 diabetes mellitus with other circulatory complications: Secondary | ICD-10-CM

## 2022-07-20 DIAGNOSIS — E669 Obesity, unspecified: Secondary | ICD-10-CM

## 2022-07-20 DIAGNOSIS — E559 Vitamin D deficiency, unspecified: Secondary | ICD-10-CM | POA: Diagnosis not present

## 2022-07-20 MED ORDER — VITAMIN D (ERGOCALCIFEROL) 1.25 MG (50000 UNIT) PO CAPS: 50000.0000 [IU] | ORAL_CAPSULE | ORAL | 0 refills | Status: DC

## 2022-07-20 MED ORDER — SEMAGLUTIDE (1 MG/DOSE) 4 MG/3ML ~~LOC~~ SOPN: 1.0000 mg | PEN_INJECTOR | SUBCUTANEOUS | 0 refills | Status: DC

## 2022-07-21 ENCOUNTER — Ambulatory Visit (INDEPENDENT_AMBULATORY_CARE_PROVIDER_SITE_OTHER): Payer: Medicare PPO | Admitting: Physician Assistant

## 2022-07-28 ENCOUNTER — Other Ambulatory Visit: Payer: Self-pay

## 2022-07-28 DIAGNOSIS — I10 Essential (primary) hypertension: Secondary | ICD-10-CM

## 2022-07-28 DIAGNOSIS — E7849 Other hyperlipidemia: Secondary | ICD-10-CM

## 2022-07-28 DIAGNOSIS — F3289 Other specified depressive episodes: Secondary | ICD-10-CM

## 2022-07-28 DIAGNOSIS — E1159 Type 2 diabetes mellitus with other circulatory complications: Secondary | ICD-10-CM

## 2022-07-28 MED ORDER — METOPROLOL SUCCINATE ER 25 MG PO TB24: 12.5000 mg | ORAL_TABLET | Freq: Every day | ORAL | 0 refills | Status: DC

## 2022-07-28 MED ORDER — ESCITALOPRAM OXALATE 20 MG PO TABS: 20.0000 mg | ORAL_TABLET | Freq: Every day | ORAL | 5 refills | Status: DC

## 2022-07-28 MED ORDER — BUPROPION HCL ER (SR) 200 MG PO TB12: 200.0000 mg | ORAL_TABLET | Freq: Every morning | ORAL | 3 refills | Status: DC

## 2022-07-28 MED ORDER — ROSUVASTATIN CALCIUM 40 MG PO TABS: ORAL_TABLET | ORAL | 0 refills | Status: DC

## 2022-07-28 MED ORDER — GLIMEPIRIDE 2 MG PO TABS: ORAL_TABLET | ORAL | 0 refills | Status: DC

## 2022-08-08 ENCOUNTER — Ambulatory Visit
Admission: EM | Admit: 2022-08-08 | Discharge: 2022-08-08 | Disposition: A | Discharge status: Home / Self Care | Payer: Medicare PPO | Attending: Internal Medicine | Admitting: Internal Medicine

## 2022-08-08 DIAGNOSIS — R319 Hematuria, unspecified: Secondary | ICD-10-CM

## 2022-08-08 DIAGNOSIS — R109 Unspecified abdominal pain: Secondary | ICD-10-CM

## 2022-08-08 DIAGNOSIS — R10A1 Flank pain, right side: Secondary | ICD-10-CM

## 2022-08-08 LAB — POCT URINALYSIS DIP (MANUAL ENTRY)
Bilirubin, UA: NEGATIVE
Glucose, UA: NEGATIVE mg/dL
Ketones, POC UA: NEGATIVE mg/dL
Leukocytes, UA: NEGATIVE
Nitrite, UA: NEGATIVE
Protein Ur, POC: NEGATIVE mg/dL
Spec Grav, UA: 1.01 (ref 1.010–1.025)
Urobilinogen, UA: 0.2 E.U./dL
pH, UA: 6 (ref 5.0–8.0)

## 2022-08-08 MED ORDER — TRAMADOL HCL 50 MG PO TABS: 50.0000 mg | ORAL_TABLET | Freq: Two times a day (BID) | ORAL | 0 refills | Status: DC | PRN

## 2022-08-08 NOTE — ED Triage Notes (Signed)
Pt reports sharp lower abdominal pain, low back, frequent urination, urgency x 3 days.
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
## 2022-08-09 NOTE — Progress Notes (Signed)
Chief Complaint:   OBESITY [name] [name] is here to discuss his progress with his obesity treatment plan along with follow-up of his obesity related diagnoses. [name] [name] is on the Category 3 Plan and states he is following his eating plan approximately 75% of the time. [name] [name] states he is walking for 15 minutes 5 times per week.  Today's visit was #: 38 Starting weight: 340 lbs Starting date: 12/28/2016 Today's weight: 317 lbs Today's date: 07/20/2022 Total lbs lost to date: 23 Total lbs lost since last in-office visit: 0  Interim History: [name] [name] has done well with minimizing holiday weight gain.  He continues to work on increasing protein and portion control, and he is trying to be more active.  Subjective:   1. Type 2 diabetes mellitus with other circulatory complication, without long-term current use of insulin (HCC) [name] [name] is on Ozempic and his glucose readings are mostly below 200.  His last A1c was elevated when he could not stay on his GLP-1.  He is working on getting patient assistance for this.  2. Vitamin D deficiency [name] [name] is on vitamin D prescription with no side effects noted.  Assessment/Plan:   1. Type 2 diabetes mellitus with other circulatory complication, without long-term current use of insulin ([name] [name]) [name] [name] will continue Ozempic 1 mg once weekly, and we will refill for 1 month.  - Semaglutide, 1 MG/DOSE, 4 MG/3ML SOPN; Inject 1 mg as directed once a week.  Dispense: 3 mL; Refill: 0  2. Vitamin D deficiency [name] [name] will continue prescription vitamin D 50,000 IU every 14 days, and we will refill for 1 month.  - Vitamin D, Ergocalciferol, (DRISDOL) 1.25 MG (50000 UNIT) CAPS capsule; Take 1 capsule (50,000 Units total) by mouth every 14 (fourteen) days.  Dispense: 2 capsule; Refill: 0  3. Obesity with BMI 41.8 [name] [name] is currently in the action stage of change. As such, his goal is to continue with weight loss efforts. He has agreed to the Category 3 Plan.   Exercise  goals: As is.   Behavioral modification strategies: increasing lean protein intake and holiday eating strategies .  [name] [name] has agreed to follow-up with our clinic in 4 weeks. He was informed of the importance of frequent follow-up visits to maximize his success with intensive lifestyle modifications for his multiple health conditions.   Objective:   Blood pressure (!) 146/78, pulse 69, temperature 98.1 F (36.7 C), height '6\' 1"'$  (1.854 m), weight (!) 317 lb (143.8 kg), SpO2 96 %. Body mass index is 41.82 kg/m.  General: Cooperative, alert, well developed, in no acute distress. HEENT: Conjunctivae and lids unremarkable. Cardiovascular: Regular rhythm.  Lungs: Normal work of breathing. Neurologic: No focal deficits.   Lab Results  Component Value Date   CREATININE 1.23 03/07/2022   BUN 22 03/07/2022   NA 137 03/07/2022   K 4.3 03/07/2022   CL 99 03/07/2022   CO2 24 03/07/2022   Lab Results  Component Value Date   ALT 35 04/18/2022   AST 21 03/07/2022   ALKPHOS 69 03/07/2022   BILITOT 0.6 03/07/2022   Lab Results  Component Value Date   HGBA1C 10.1 (H) 03/07/2022   HGBA1C 10.9 (H) 02/07/2022   HGBA1C 6.4 09/14/2021   HGBA1C 6.3 (H) 02/22/2021   HGBA1C 6.6 (H) 09/16/2020   Lab Results  Component Value Date   INSULIN 22.7 03/07/2022   INSULIN 12.8 02/07/2022   INSULIN 11.7 02/22/2021   INSULIN 9.1 09/16/2020   INSULIN 10.0 08/14/2019   Lab  Results  Component Value Date   TSH 1.260 03/07/2022   Lab Results  Component Value Date   CHOL 113 04/18/2022   HDL 38 (L) 04/18/2022   LDLCALC 58 04/18/2022   LDLDIRECT 148 (H) 11/15/2007   TRIG 83 04/18/2022   CHOLHDL 3.0 04/18/2022   Lab Results  Component Value Date   VD25OH 60.9 03/07/2022   VD25OH 60.3 02/07/2022   VD25OH 70.4 02/22/2021   Lab Results  Component Value Date   WBC 6.5 03/07/2022   HGB 15.8 03/07/2022   HCT 48.7 03/07/2022   MCV 87 03/07/2022   PLT 254 03/07/2022   No results found for:  "IRON", "TIBC", "FERRITIN"  Attestation Statements:   Reviewed by clinician on day of visit: allergies, medications, problem list, medical history, surgical history, family history, social history, and previous encounter notes.   I, Trixie Dredge, am acting as transcriptionist for [name], MD.  I have reviewed the above documentation for accuracy and completeness, and I agree with the above. -  [name], MD

## 2022-08-11 ENCOUNTER — Ambulatory Visit (INDEPENDENT_AMBULATORY_CARE_PROVIDER_SITE_OTHER): Payer: Medicare PPO | Admitting: Family Medicine

## 2022-08-11 ENCOUNTER — Encounter (HOSPITAL_BASED_OUTPATIENT_CLINIC_OR_DEPARTMENT_OTHER): Payer: Self-pay | Admitting: Emergency Medicine

## 2022-08-11 ENCOUNTER — Emergency Department (HOSPITAL_BASED_OUTPATIENT_CLINIC_OR_DEPARTMENT_OTHER): Payer: Medicare PPO

## 2022-08-11 ENCOUNTER — Emergency Department (HOSPITAL_BASED_OUTPATIENT_CLINIC_OR_DEPARTMENT_OTHER)
Admission: EM | Admit: 2022-08-11 | Discharge: 2022-08-11 | Disposition: A | Discharge status: Home / Self Care | Payer: Medicare PPO | Attending: Emergency Medicine | Admitting: Emergency Medicine

## 2022-08-11 ENCOUNTER — Encounter (INDEPENDENT_AMBULATORY_CARE_PROVIDER_SITE_OTHER): Payer: Self-pay | Admitting: Family Medicine

## 2022-08-11 ENCOUNTER — Other Ambulatory Visit: Payer: Self-pay

## 2022-08-11 VITALS — BP 140/75 | HR 70 | Temp 97.7°F | Ht 73.0 in | Wt 316.0 lb

## 2022-08-11 DIAGNOSIS — G4733 Obstructive sleep apnea (adult) (pediatric): Secondary | ICD-10-CM | POA: Diagnosis not present

## 2022-08-11 DIAGNOSIS — R1013 Epigastric pain: Secondary | ICD-10-CM

## 2022-08-11 DIAGNOSIS — R109 Unspecified abdominal pain: Secondary | ICD-10-CM

## 2022-08-11 DIAGNOSIS — Z6841 Body Mass Index (BMI) 40.0 and over, adult: Secondary | ICD-10-CM

## 2022-08-11 DIAGNOSIS — E119 Type 2 diabetes mellitus without complications: Secondary | ICD-10-CM | POA: Diagnosis not present

## 2022-08-11 DIAGNOSIS — N201 Calculus of ureter: Secondary | ICD-10-CM

## 2022-08-11 DIAGNOSIS — E1159 Type 2 diabetes mellitus with other circulatory complications: Secondary | ICD-10-CM | POA: Diagnosis not present

## 2022-08-11 DIAGNOSIS — E669 Obesity, unspecified: Secondary | ICD-10-CM

## 2022-08-11 DIAGNOSIS — K5730 Diverticulosis of large intestine without perforation or abscess without bleeding: Secondary | ICD-10-CM | POA: Diagnosis not present

## 2022-08-11 DIAGNOSIS — E559 Vitamin D deficiency, unspecified: Secondary | ICD-10-CM

## 2022-08-11 DIAGNOSIS — R1030 Lower abdominal pain, unspecified: Secondary | ICD-10-CM | POA: Diagnosis not present

## 2022-08-11 DIAGNOSIS — N132 Hydronephrosis with renal and ureteral calculous obstruction: Secondary | ICD-10-CM | POA: Diagnosis not present

## 2022-08-11 DIAGNOSIS — Z7982 Long term (current) use of aspirin: Secondary | ICD-10-CM | POA: Diagnosis not present

## 2022-08-11 DIAGNOSIS — Z7902 Long term (current) use of antithrombotics/antiplatelets: Secondary | ICD-10-CM | POA: Diagnosis not present

## 2022-08-11 DIAGNOSIS — Z7985 Long-term (current) use of injectable non-insulin antidiabetic drugs: Secondary | ICD-10-CM

## 2022-08-11 DIAGNOSIS — Z7984 Long term (current) use of oral hypoglycemic drugs: Secondary | ICD-10-CM | POA: Diagnosis not present

## 2022-08-11 LAB — URINALYSIS, ROUTINE W REFLEX MICROSCOPIC
Bacteria, UA: NONE SEEN
Bilirubin Urine: NEGATIVE
Glucose, UA: NEGATIVE mg/dL
Ketones, ur: NEGATIVE mg/dL
Leukocytes,Ua: NEGATIVE
Nitrite: NEGATIVE
Protein, ur: NEGATIVE mg/dL
Specific Gravity, Urine: 1.011 (ref 1.005–1.030)
pH: 5 (ref 5.0–8.0)

## 2022-08-11 LAB — CBC WITH DIFFERENTIAL/PLATELET
Abs Immature Granulocytes: 0.02 10*3/uL (ref 0.00–0.07)
Basophils Absolute: 0 10*3/uL (ref 0.0–0.1)
Basophils Relative: 0 %
Eosinophils Absolute: 0.3 10*3/uL (ref 0.0–0.5)
Eosinophils Relative: 5 %
HCT: 44.9 % (ref 39.0–52.0)
Hemoglobin: 14.7 g/dL (ref 13.0–17.0)
Immature Granulocytes: 0 %
Lymphocytes Relative: 17 %
Lymphs Abs: 1.2 10*3/uL (ref 0.7–4.0)
MCH: 28.1 pg (ref 26.0–34.0)
MCHC: 32.7 g/dL (ref 30.0–36.0)
MCV: 85.7 fL (ref 80.0–100.0)
Monocytes Absolute: 0.6 10*3/uL (ref 0.1–1.0)
Monocytes Relative: 9 %
Neutro Abs: 4.7 10*3/uL (ref 1.7–7.7)
Neutrophils Relative %: 69 %
Platelets: 237 10*3/uL (ref 150–400)
RBC: 5.24 MIL/uL (ref 4.22–5.81)
RDW: 15.5 % (ref 11.5–15.5)
WBC: 6.9 10*3/uL (ref 4.0–10.5)
nRBC: 0 % (ref 0.0–0.2)

## 2022-08-11 LAB — BASIC METABOLIC PANEL
Anion gap: 10 (ref 5–15)
BUN: 17 mg/dL (ref 8–23)
CO2: 27 mmol/L (ref 22–32)
Calcium: 9.2 mg/dL (ref 8.9–10.3)
Chloride: 103 mmol/L (ref 98–111)
Creatinine, Ser: 1.35 mg/dL — ABNORMAL HIGH (ref 0.61–1.24)
GFR, Estimated: 55 mL/min — ABNORMAL LOW (ref >60)
Glucose, Bld: 113 mg/dL — ABNORMAL HIGH (ref 70–99)
Potassium: 3.9 mmol/L (ref 3.5–5.1)
Sodium: 140 mmol/L (ref 135–145)

## 2022-08-11 MED ORDER — NYSTATIN 100000 UNIT/GM EX POWD
Freq: Once | CUTANEOUS | Status: AC
  Filled 2022-08-11: qty 15

## 2022-08-11 MED ORDER — VITAMIN D (ERGOCALCIFEROL) 1.25 MG (50000 UNIT) PO CAPS: 50000.0000 [IU] | ORAL_CAPSULE | ORAL | 0 refills | Status: DC

## 2022-08-11 MED ORDER — SEMAGLUTIDE (1 MG/DOSE) 4 MG/3ML ~~LOC~~ SOPN: 1.0000 mg | PEN_INJECTOR | SUBCUTANEOUS | 0 refills | Status: DC

## 2022-08-11 MED ORDER — IOHEXOL 300 MG/ML  SOLN
100.0000 mL | Freq: Once | INTRAMUSCULAR | Status: AC | PRN
  Administered 2022-08-11: 100 mL via INTRAVENOUS

## 2022-08-11 MED ORDER — NYSTATIN 100000 UNIT/GM EX POWD: 1.0000 | Freq: Three times a day (TID) | CUTANEOUS | 0 refills | Status: AC

## 2022-08-11 MED ORDER — OXYCODONE-ACETAMINOPHEN 5-325 MG PO TABS
1.0000 | ORAL_TABLET | Freq: Once | ORAL | Status: AC
  Administered 2022-08-11: 1 via ORAL
  Filled 2022-08-11: qty 1

## 2022-08-11 MED ORDER — TAMSULOSIN HCL 0.4 MG PO CAPS: 0.4000 mg | ORAL_CAPSULE | Freq: Every day | ORAL | 0 refills | Status: AC

## 2022-08-11 MED ORDER — OXYBUTYNIN CHLORIDE 5 MG PO TABS: 5.0000 mg | ORAL_TABLET | Freq: Once | ORAL | Status: DC

## 2022-08-11 NOTE — Discharge Instructions (Addendum)
Thank you for coming to [facility] Emergency Department. You were seen for pain in your groin. We did an exam, labs, and imaging, and these showed several findings:  1. No acute abnormality. Specifically, no evidence of soft tissue infection involving the perineum. 2. Redemonstration of 4 mm stone at the right ureterovesical junction, partially obscured by excreted contrast. 3. Focal area of nodularity along the anterior wall of the urinary bladder measuring approximately 8 x 6 mm, concerning for a small bladder neoplasm. Urology consultation is recommended. 4. Colonic diverticulosis without evidence of acute diverticulitis. 5. Left testis is located within the left inguinal canal. The right testis is located within the right scrotal sac. 6. Aortic atherosclerosis (ICD10-I70.0).  4. Mildly nodular hepatic contour with fissural widening and suggestion of steatosis. Correlate with any clinical or laboratory evidence of liver disease. 5. RIGHT adrenal adenoma. Unchanged since previous imaging. Based on current clinical literature, biochemical evaluation to exclude possible functioning adrenal nodule is suggested if not already performed.   You have a kidney stone that is likely causing her symptoms.  We prescribed tamsulosin 0.4 mg to take every night with supper.  You can also take Tylenol and ibuprofen for pain control. You can take your home tramadol for breakthrough pain. We have also changed her nystatin cream to nystatin powder to help your skin be less moist. Please call and follow up with your urologist at [facility] within 1-2 weeks.  Do not hesitate to return to the ED or call 911 if you experience: -Worsening symptoms -Lightheadedness, passing out -Fevers/chills -Anything else that concerns you

## 2022-08-11 NOTE — ED Notes (Signed)
Labs re-drawn due to clotting.

## 2022-08-11 NOTE — ED Notes (Signed)
Pt verbalized understanding of d/c instructions, meds, and followup care. Denies questions. VSS, no distress noted. Steady gait to exit with all belongings.  ?

## 2022-08-11 NOTE — ED Notes (Signed)
Patient provided with a urine specimen cup and asked to provide a sample.

## 2022-08-11 NOTE — ED Provider Notes (Signed)
[place] EMERGENCY DEPT Provider Note   CSN: [id number] Arrival date & time: 08/11/22  1249     History  Chief Complaint  Patient presents with   Groin Pain    [name] [name] is a 77 y.o. male.  Patient is a 77 year old male with past medical history of diabetes presenting for groin pain.  Patient admits to pain in his perineum over the past 4 days has been worsening.  Denies any dysuria, increased frequency or urgency.  Admits to a red rash near his groin that he is currently being treated for by his primary care physician with a nystatin cream.  Denies any penile pain.  Admits to scrotal pain.  Unknown swelling.  The history is provided by the patient. No language interpreter was used.  Groin Pain Pertinent negatives include no chest pain, no abdominal pain and no shortness of breath.       Home Medications Prior to Admission medications   Medication Sig Start Date End Date Taking? Authorizing Provider  aspirin EC 81 MG tablet Take 1 tablet (81 mg total) by mouth daily. Swallow whole. 09/29/21   Jettie Booze, MD  buPROPion [facility] SR) 200 MG 12 hr tablet Take 1 tablet (200 mg total) by mouth every morning. 07/28/22   Holley Bouche, MD  clopidogrel (PLAVIX) 75 MG tablet Take 1 tablet (75 mg total) by mouth daily with breakfast. 03/16/22   Alen Bleacher, MD  doxycycline (MONODOX) 50 MG capsule Take 50 mg by mouth daily. 03/07/22   [provider]  doxycycline (VIBRAMYCIN) 50 MG capsule Take 50 mg by mouth daily.    [provider]  erythromycin ophthalmic ointment Place 1 application into both eyes at bedtime.    [provider]  escitalopram (LEXAPRO) 20 MG tablet Take 1 tablet (20 mg total) by mouth daily. 07/28/22 01/24/23  Holley Bouche, MD  ezetimibe (ZETIA) 10 MG tablet Take 1 tablet (10 mg total) by mouth daily. 01/17/22   Richardson Dopp T, PA-C  fluorouracil (EFUDEX) 5 % cream Apply topically 2 (two) times daily.  04/18/22   [provider]  furosemide (LASIX) 40 MG tablet Take 1 tablet (40 mg total) by mouth daily. 03/16/22   Alen Bleacher, MD  glimepiride (AMARYL) 2 MG tablet TAKE ONE TABLET BY MOUTH ONCEDAILY. 07/28/22   Holley Bouche, MD  glucose blood (ACCU-CHEK AVIVA PLUS) test strip USE TO CHECK BLOOD SUGAR TWICE DAILY. 04/21/22   [name] D, MD  isosorbide mononitrate (IMDUR) 30 MG 24 hr tablet Take 1 tablet (30 mg total) by mouth daily. 01/11/22 01/12/23  Richardson Dopp T, PA-C  Lancet Devices (ACCU-CHEK [place]) lancets Use to test twice daily 02/13/17   Everrett Coombe, MD  losartan (COZAAR) 25 MG tablet Take 1 tablet (25 mg total) by mouth daily. 10/13/21   Elgie Collard, PA-C  metFORMIN (GLUCOPHAGE) 1000 MG tablet Take 1 tablet (1,000 mg total) by mouth in the morning and at bedtime. 05/10/22   Alen Bleacher, MD  metoprolol succinate (TOPROL-XL) 25 MG 24 hr tablet Take 0.5 tablets (12.5 mg total) by mouth daily. 07/28/22   Holley Bouche, MD  Multiple Vitamins-Minerals (MENS 50+ MULTI VITAMIN/MIN) TABS Take 1 tablet by mouth daily.    [provider]  nitroGLYCERIN (NITROSTAT) 0.4 MG SL tablet Place 1 tablet (0.4 mg total) under the tongue every 5 (five) minutes as needed. 10/06/21   Cheryln Manly, NP  rosuvastatin (CRESTOR) 40 MG tablet Take 1 tablet by mouth daily 07/28/22  Holley Bouche, MD  Semaglutide, 1 MG/DOSE, 4 MG/3ML SOPN Inject 1 mg as directed once a week. 08/11/22   [name] D, MD  solifenacin (VESICARE) 5 MG tablet TAKE ONE TABLET BY MOUTH ONCE DAILY. 10/11/21   Cresenzo, Angelyn Punt, MD  traMADol (ULTRAM) 50 MG tablet Take 1 tablet (50 mg total) by mouth every 12 (twelve) hours as needed. 08/08/22   Volney American, PA-C  TURMERIC PO Take 1 tablet by mouth daily.    [provider]  Vibegron (GEMTESA) 75 MG TABS Take 75 mg by mouth daily.    [provider]  Vitamin D, Ergocalciferol, (DRISDOL) 1.25 MG (50000 UNIT) CAPS capsule Take 1  capsule (50,000 Units total) by mouth every 14 (fourteen) days. 08/11/22   [name] D, MD      Allergies    Lisinopril, Lodine [etodolac], and Aleve [naproxen]    Review of Systems   Review of Systems  Constitutional:  Negative for chills and fever.  HENT:  Negative for ear pain and sore throat.   Eyes:  Negative for pain and visual disturbance.  Respiratory:  Negative for cough and shortness of breath.   Cardiovascular:  Negative for chest pain and palpitations.  Gastrointestinal:  Negative for abdominal pain and vomiting.  Genitourinary:  Positive for testicular pain. Negative for dysuria, hematuria, penile pain, penile swelling and scrotal swelling.       Groin pain   Musculoskeletal:  Negative for arthralgias and back pain.  Skin:  Negative for color change and rash.  Neurological:  Negative for seizures and syncope.  All other systems reviewed and are negative.   Physical Exam Updated Vital Signs BP (!) 128/50   Pulse 74   Temp 97.7 F (36.5 C)   Resp 18   SpO2 97%  Physical Exam Vitals and nursing note reviewed. Exam conducted with a chaperone present.  Constitutional:      General: He is not in acute distress.    Appearance: He is well-developed.  HENT:     Head: Normocephalic and atraumatic.  Eyes:     Conjunctiva/sclera: Conjunctivae normal.  Cardiovascular:     Rate and Rhythm: Normal rate and regular rhythm.     Heart sounds: No murmur heard. Pulmonary:     Effort: Pulmonary effort is normal. No respiratory distress.     Breath sounds: Normal breath sounds.  Abdominal:     Palpations: Abdomen is soft.     Tenderness: There is no abdominal tenderness.  Genitourinary:      Comments: No testicular or penile swelling. Musculoskeletal:        General: No swelling.     Cervical back: Neck supple.  Skin:    General: Skin is warm and dry.     Capillary Refill: Capillary refill takes less than 2 seconds.  Neurological:     Mental Status: He is alert.   Psychiatric:        Mood and Affect: Mood normal.     ED Results / Procedures / Treatments   Labs (all labs ordered are listed, but only abnormal results are displayed) Labs Reviewed  CBC WITH DIFFERENTIAL/PLATELET  BASIC METABOLIC PANEL  URINALYSIS, ROUTINE W REFLEX MICROSCOPIC    EKG None  Radiology No results found.  Procedures Procedures    Medications Ordered in ED Medications  nystatin (MYCOSTATIN/NYSTOP) topical powder (has no administration in time range)  oxybutynin (DITROPAN) tablet 5 mg (has no administration in time range)    ED Course/ Medical  Decision Making/ A&P                           Medical Decision Making Amount and/or Complexity of Data Reviewed Labs: ordered. Radiology: ordered.  Risk Prescription drug management.   71:42 PM 77 year old male with past medical history of diabetes presenting for groin pain.  Patient is alert and oriented, no acute distress, afebrile, stable vital signs.  Physical exam concerning for inguinal candidiasis.  Will switch patient to nystatin powder as most of nystatin cream to help decrease moisture.  No testicular swelling or penile swelling on exam.  The perineum does not demonstrate any rashes however this is where patient's tenderness is.  No crepitus.  Low suspicion for necrotizing fasciitis however will get CT pelvis to rule it out and basic blood work due to patient's history of diabetes.  Patient signed out to oncoming provider while awaiting CT pelvis results.  Laboratory studies stable.  No leukocytosis.  No signs or symptoms of sepsis at this time.        Final Clinical Impression(s) / ED Diagnoses Final diagnoses:  None    Rx / DC Orders ED Discharge Orders     None         Lianne Cure, DO 16/96/78 1538

## 2022-08-11 NOTE — ED Triage Notes (Signed)
Pt arrives to ED with c/o groin pain that started on 1/8. He notes the pain originally started on his right flank area.

## 2022-08-11 NOTE — ED Provider Notes (Signed)
3:46 PM Assumed care of patient from off-going team. For more details, please see note from same day.  In brief, this is a 77 y.o. male who p/w groin pain. Does have candidiasis of inguinal folds but points to pain in perineum where he doesn't have much cutaneous findings. No fever or leukocytosis and VSS. Had been treated with nystatin cream, which we are switching to powder d/t moisture.   Plan/Dispo at time of sign-out & ED Course since sign-out: '[ ]'$  CT pelvis to r/o nec fasc  BP 122/69   Pulse 66   Temp 97.7 F (36.5 C)   Resp 18   SpO2 99%    ED Course:   Clinical Course as of 08/11/22 2031  Thu Aug 11, 2022  1716 CT ABDOMEN PELVIS W CONTRAST 1. Signs of early obstructive uropathy associated with a 4-5 mm RIGHT UVJ calculus. 2. Nephrolithiasis on the LEFT. 3. Post prostatectomy. 4. Mildly nodular hepatic contour with fissural widening and suggestion of steatosis. Correlate with any clinical or laboratory evidence of liver disease. 5. RIGHT adrenal adenoma. Unchanged since previous imaging. Based on current clinical literature, biochemical evaluation to exclude possible functioning adrenal nodule is suggested if not already performed. Please refer to current clinical guidelines for detailed recommendations. NEJM 6967:893 154-51 6. Perineum is incompletely evaluated. If there is concern for acute perineal process could consider repeat imaging. There is a cause for RIGHT flank pain on the current study.  Aortic Atherosclerosis (ICD10-I70.0).   [HN]  [id number] D/w radiology. Early obstructing UVJ on the R, which could explain perineal pain. Scan did not extend all the way through the perineum or scrotum. Will send patient back for completion of noncon scan through the perineum/scrotum to fully evaluate for subQ gas.  [HN]  1925 CT PELVIS WO CONTRAST 1. No acute abnormality. Specifically, no evidence of soft tissue infection involving the perineum. 2. Redemonstration of 4 mm stone at the  right ureterovesical junction, partially obscured by excreted contrast. 3. Focal area of nodularity along the anterior wall of the urinary bladder measuring approximately 8 x 6 mm, concerning for a small bladder neoplasm. Urology consultation is recommended. 4. Colonic diverticulosis without evidence of acute diverticulitis. 5. Left testis is located within the left inguinal canal. The right testis is located within the right scrotal sac. 6. Aortic atherosclerosis (ICD10-I70.0).   [HN]  2030 Discussed with patient extensively about his imaging findings, including a UVJ stone, growth of the bladder wall, left testis in the inguinal canal, adrenal adenoma.  Patient understands all these findings.  Patient will call his urologist [facility] in the morning to make a follow-up appointment for this week.  Discussed with Dr. Milford Cage from urology who states that though the stone is early obstructing on the right it is small and will likely still pass on its own.  His renal function is at its approximate baseline.  He is given prescriptions for tamsulosin as well as nystatin powder.  Patient has tramadol prescription at home which he can take for breakthrough pain.  Discharged with discharge instructions and return precautions.  All questions answered to patient satisfaction. [HN]    Clinical Course User Index [HN] Audley Hose, MD    Dispo: DC ------------------------------- Cindee Lame, MD Emergency Medicine  This note was created using dictation software, which may contain spelling or grammatical errors.   Audley Hose, MD 08/11/22 2031

## 2022-08-11 NOTE — ED Notes (Signed)
Ambulatory to exam room

## 2022-08-16 ENCOUNTER — Encounter (INDEPENDENT_AMBULATORY_CARE_PROVIDER_SITE_OTHER): Payer: Self-pay | Admitting: Family Medicine

## 2022-08-16 DIAGNOSIS — N202 Calculus of kidney with calculus of ureter: Secondary | ICD-10-CM | POA: Diagnosis not present

## 2022-08-17 ENCOUNTER — Encounter (HOSPITAL_BASED_OUTPATIENT_CLINIC_OR_DEPARTMENT_OTHER): Payer: Self-pay | Admitting: Emergency Medicine

## 2022-08-17 ENCOUNTER — Emergency Department (HOSPITAL_BASED_OUTPATIENT_CLINIC_OR_DEPARTMENT_OTHER)
Admission: EM | Admit: 2022-08-17 | Discharge: 2022-08-18 | Disposition: A | Discharge status: Home / Self Care | Payer: Medicare PPO | Source: Home / Self Care | Attending: Emergency Medicine | Admitting: Emergency Medicine

## 2022-08-17 ENCOUNTER — Other Ambulatory Visit: Payer: Self-pay

## 2022-08-17 DIAGNOSIS — F32A Depression, unspecified: Secondary | ICD-10-CM | POA: Diagnosis not present

## 2022-08-17 DIAGNOSIS — F419 Anxiety disorder, unspecified: Secondary | ICD-10-CM | POA: Diagnosis not present

## 2022-08-17 DIAGNOSIS — E119 Type 2 diabetes mellitus without complications: Secondary | ICD-10-CM | POA: Insufficient documentation

## 2022-08-17 DIAGNOSIS — N200 Calculus of kidney: Secondary | ICD-10-CM | POA: Diagnosis not present

## 2022-08-17 DIAGNOSIS — Z6841 Body Mass Index (BMI) 40.0 and over, adult: Secondary | ICD-10-CM | POA: Diagnosis not present

## 2022-08-17 DIAGNOSIS — Z8673 Personal history of transient ischemic attack (TIA), and cerebral infarction without residual deficits: Secondary | ICD-10-CM | POA: Insufficient documentation

## 2022-08-17 DIAGNOSIS — J45909 Unspecified asthma, uncomplicated: Secondary | ICD-10-CM | POA: Diagnosis not present

## 2022-08-17 DIAGNOSIS — Z7985 Long-term (current) use of injectable non-insulin antidiabetic drugs: Secondary | ICD-10-CM | POA: Diagnosis not present

## 2022-08-17 DIAGNOSIS — Z955 Presence of coronary angioplasty implant and graft: Secondary | ICD-10-CM | POA: Insufficient documentation

## 2022-08-17 DIAGNOSIS — Z8546 Personal history of malignant neoplasm of prostate: Secondary | ICD-10-CM | POA: Insufficient documentation

## 2022-08-17 DIAGNOSIS — M1990 Unspecified osteoarthritis, unspecified site: Secondary | ICD-10-CM | POA: Diagnosis not present

## 2022-08-17 DIAGNOSIS — Z85828 Personal history of other malignant neoplasm of skin: Secondary | ICD-10-CM | POA: Insufficient documentation

## 2022-08-17 DIAGNOSIS — Z7984 Long term (current) use of oral hypoglycemic drugs: Secondary | ICD-10-CM | POA: Diagnosis not present

## 2022-08-17 DIAGNOSIS — G709 Myoneural disorder, unspecified: Secondary | ICD-10-CM | POA: Diagnosis not present

## 2022-08-17 DIAGNOSIS — N3946 Mixed incontinence: Secondary | ICD-10-CM | POA: Diagnosis not present

## 2022-08-17 DIAGNOSIS — Z79899 Other long term (current) drug therapy: Secondary | ICD-10-CM | POA: Diagnosis not present

## 2022-08-17 DIAGNOSIS — I509 Heart failure, unspecified: Secondary | ICD-10-CM | POA: Diagnosis not present

## 2022-08-17 DIAGNOSIS — N132 Hydronephrosis with renal and ureteral calculous obstruction: Secondary | ICD-10-CM | POA: Diagnosis not present

## 2022-08-17 DIAGNOSIS — N179 Acute kidney failure, unspecified: Secondary | ICD-10-CM | POA: Diagnosis not present

## 2022-08-17 DIAGNOSIS — I2510 Atherosclerotic heart disease of native coronary artery without angina pectoris: Secondary | ICD-10-CM | POA: Diagnosis not present

## 2022-08-17 DIAGNOSIS — E1151 Type 2 diabetes mellitus with diabetic peripheral angiopathy without gangrene: Secondary | ICD-10-CM | POA: Diagnosis not present

## 2022-08-17 DIAGNOSIS — R1031 Right lower quadrant pain: Secondary | ICD-10-CM

## 2022-08-17 DIAGNOSIS — G4733 Obstructive sleep apnea (adult) (pediatric): Secondary | ICD-10-CM | POA: Diagnosis not present

## 2022-08-17 DIAGNOSIS — I110 Hypertensive heart disease with heart failure: Secondary | ICD-10-CM | POA: Diagnosis not present

## 2022-08-17 DIAGNOSIS — K219 Gastro-esophageal reflux disease without esophagitis: Secondary | ICD-10-CM | POA: Diagnosis not present

## 2022-08-17 DIAGNOSIS — E785 Hyperlipidemia, unspecified: Secondary | ICD-10-CM | POA: Diagnosis not present

## 2022-08-17 MED ORDER — ONDANSETRON HCL 4 MG/2ML IJ SOLN
4.0000 mg | Freq: Once | INTRAMUSCULAR | Status: AC
  Administered 2022-08-17: 4 mg via INTRAVENOUS
  Filled 2022-08-17: qty 2

## 2022-08-17 MED ORDER — HYDROMORPHONE HCL 1 MG/ML IJ SOLN
1.0000 mg | Freq: Once | INTRAMUSCULAR | Status: AC
  Administered 2022-08-17: 1 mg via INTRAVENOUS
  Filled 2022-08-17: qty 1

## 2022-08-17 MED ORDER — SODIUM CHLORIDE 0.9 % IV BOLUS
1000.0000 mL | Freq: Once | INTRAVENOUS | Status: AC
  Administered 2022-08-17: 1000 mL via INTRAVENOUS
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
## 2022-08-17 NOTE — ED Triage Notes (Signed)
  Patient comes in with RLQ pain that has been going on since 1/11.  Patient was diagnosed with kidney stone then and states the pain has been getting worse.  Endorses N/V earlier this evening.  No fevers at home.  Possible neoplasm was also seen on CT scan.  Pain 10/10, sharp/throbbing.

## 2022-08-18 ENCOUNTER — Emergency Department (HOSPITAL_BASED_OUTPATIENT_CLINIC_OR_DEPARTMENT_OTHER): Payer: Medicare PPO

## 2022-08-18 ENCOUNTER — Ambulatory Visit (HOSPITAL_COMMUNITY): Payer: Medicare PPO | Admitting: Anesthesiology

## 2022-08-18 ENCOUNTER — Ambulatory Visit (HOSPITAL_COMMUNITY): Payer: Medicare PPO

## 2022-08-18 ENCOUNTER — Encounter (HOSPITAL_COMMUNITY): Payer: Self-pay | Admitting: Urology

## 2022-08-18 ENCOUNTER — Encounter (HOSPITAL_COMMUNITY)
Admission: RE | Disposition: A | Discharge status: Home / Self Care | Payer: Self-pay | Source: Home / Self Care | Attending: Urology

## 2022-08-18 ENCOUNTER — Ambulatory Visit (HOSPITAL_COMMUNITY)
Admission: RE | Admit: 2022-08-18 | Discharge: 2022-08-18 | Disposition: A | Discharge status: Home / Self Care | Payer: Medicare PPO | Attending: Urology | Admitting: Urology

## 2022-08-18 ENCOUNTER — Ambulatory Visit (HOSPITAL_BASED_OUTPATIENT_CLINIC_OR_DEPARTMENT_OTHER): Payer: Medicare PPO | Admitting: Anesthesiology

## 2022-08-18 ENCOUNTER — Other Ambulatory Visit: Payer: Self-pay | Admitting: Urology

## 2022-08-18 ENCOUNTER — Inpatient Hospital Stay: Payer: Medicare PPO | Admitting: Student

## 2022-08-18 DIAGNOSIS — I509 Heart failure, unspecified: Secondary | ICD-10-CM

## 2022-08-18 DIAGNOSIS — N179 Acute kidney failure, unspecified: Secondary | ICD-10-CM | POA: Insufficient documentation

## 2022-08-18 DIAGNOSIS — I110 Hypertensive heart disease with heart failure: Secondary | ICD-10-CM | POA: Insufficient documentation

## 2022-08-18 DIAGNOSIS — K5730 Diverticulosis of large intestine without perforation or abscess without bleeding: Secondary | ICD-10-CM | POA: Insufficient documentation

## 2022-08-18 DIAGNOSIS — Z85828 Personal history of other malignant neoplasm of skin: Secondary | ICD-10-CM | POA: Insufficient documentation

## 2022-08-18 DIAGNOSIS — E1151 Type 2 diabetes mellitus with diabetic peripheral angiopathy without gangrene: Secondary | ICD-10-CM | POA: Insufficient documentation

## 2022-08-18 DIAGNOSIS — N201 Calculus of ureter: Secondary | ICD-10-CM

## 2022-08-18 DIAGNOSIS — Z8673 Personal history of transient ischemic attack (TIA), and cerebral infarction without residual deficits: Secondary | ICD-10-CM | POA: Insufficient documentation

## 2022-08-18 DIAGNOSIS — N1330 Unspecified hydronephrosis: Secondary | ICD-10-CM | POA: Diagnosis not present

## 2022-08-18 DIAGNOSIS — G4733 Obstructive sleep apnea (adult) (pediatric): Secondary | ICD-10-CM | POA: Diagnosis not present

## 2022-08-18 DIAGNOSIS — Z9989 Dependence on other enabling machines and devices: Secondary | ICD-10-CM | POA: Diagnosis not present

## 2022-08-18 DIAGNOSIS — I2510 Atherosclerotic heart disease of native coronary artery without angina pectoris: Secondary | ICD-10-CM | POA: Insufficient documentation

## 2022-08-18 DIAGNOSIS — E119 Type 2 diabetes mellitus without complications: Secondary | ICD-10-CM | POA: Insufficient documentation

## 2022-08-18 DIAGNOSIS — E785 Hyperlipidemia, unspecified: Secondary | ICD-10-CM | POA: Insufficient documentation

## 2022-08-18 DIAGNOSIS — N132 Hydronephrosis with renal and ureteral calculous obstruction: Secondary | ICD-10-CM | POA: Diagnosis not present

## 2022-08-18 DIAGNOSIS — K219 Gastro-esophageal reflux disease without esophagitis: Secondary | ICD-10-CM | POA: Insufficient documentation

## 2022-08-18 DIAGNOSIS — J45909 Unspecified asthma, uncomplicated: Secondary | ICD-10-CM | POA: Insufficient documentation

## 2022-08-18 DIAGNOSIS — Z79899 Other long term (current) drug therapy: Secondary | ICD-10-CM | POA: Insufficient documentation

## 2022-08-18 DIAGNOSIS — N202 Calculus of kidney with calculus of ureter: Secondary | ICD-10-CM | POA: Diagnosis not present

## 2022-08-18 DIAGNOSIS — N3946 Mixed incontinence: Secondary | ICD-10-CM | POA: Insufficient documentation

## 2022-08-18 DIAGNOSIS — F419 Anxiety disorder, unspecified: Secondary | ICD-10-CM | POA: Insufficient documentation

## 2022-08-18 DIAGNOSIS — Z7985 Long-term (current) use of injectable non-insulin antidiabetic drugs: Secondary | ICD-10-CM | POA: Insufficient documentation

## 2022-08-18 DIAGNOSIS — F32A Depression, unspecified: Secondary | ICD-10-CM | POA: Insufficient documentation

## 2022-08-18 DIAGNOSIS — G709 Myoneural disorder, unspecified: Secondary | ICD-10-CM | POA: Insufficient documentation

## 2022-08-18 DIAGNOSIS — Z955 Presence of coronary angioplasty implant and graft: Secondary | ICD-10-CM | POA: Insufficient documentation

## 2022-08-18 DIAGNOSIS — I700 Atherosclerosis of aorta: Secondary | ICD-10-CM | POA: Insufficient documentation

## 2022-08-18 DIAGNOSIS — Z7984 Long term (current) use of oral hypoglycemic drugs: Secondary | ICD-10-CM | POA: Insufficient documentation

## 2022-08-18 DIAGNOSIS — M1990 Unspecified osteoarthritis, unspecified site: Secondary | ICD-10-CM | POA: Insufficient documentation

## 2022-08-18 DIAGNOSIS — Z8546 Personal history of malignant neoplasm of prostate: Secondary | ICD-10-CM | POA: Insufficient documentation

## 2022-08-18 DIAGNOSIS — Z6841 Body Mass Index (BMI) 40.0 and over, adult: Secondary | ICD-10-CM | POA: Insufficient documentation

## 2022-08-18 HISTORY — PX: CYSTOSCOPY/URETEROSCOPY/HOLMIUM LASER/STENT PLACEMENT: SHX6546

## 2022-08-18 LAB — URINALYSIS, ROUTINE W REFLEX MICROSCOPIC
Bacteria, UA: NONE SEEN
Bilirubin Urine: NEGATIVE
Glucose, UA: NEGATIVE mg/dL
Ketones, ur: NEGATIVE mg/dL
Leukocytes,Ua: NEGATIVE
Nitrite: NEGATIVE
Protein, ur: NEGATIVE mg/dL
RBC / HPF: >50 RBC/hpf — ABNORMAL HIGH (ref 0–5)
Specific Gravity, Urine: 1.019 (ref 1.005–1.030)
pH: 5.5 (ref 5.0–8.0)

## 2022-08-18 LAB — COMPREHENSIVE METABOLIC PANEL
ALT: 56 U/L — ABNORMAL HIGH (ref 0–44)
AST: 24 U/L (ref 15–41)
Albumin: 4.4 g/dL (ref 3.5–5.0)
Alkaline Phosphatase: 49 U/L (ref 38–126)
Anion gap: 13 (ref 5–15)
BUN: 27 mg/dL — ABNORMAL HIGH (ref 8–23)
CO2: 23 mmol/L (ref 22–32)
Calcium: 9.9 mg/dL (ref 8.9–10.3)
Chloride: 103 mmol/L (ref 98–111)
Creatinine, Ser: 1.69 mg/dL — ABNORMAL HIGH (ref 0.61–1.24)
GFR, Estimated: 42 mL/min — ABNORMAL LOW (ref >60)
Glucose, Bld: 211 mg/dL — ABNORMAL HIGH (ref 70–99)
Potassium: 3.7 mmol/L (ref 3.5–5.1)
Sodium: 139 mmol/L (ref 135–145)
Total Bilirubin: 0.8 mg/dL (ref 0.3–1.2)
Total Protein: 7.1 g/dL (ref 6.5–8.1)

## 2022-08-18 LAB — LIPASE, BLOOD: Lipase: 32 U/L (ref 11–51)

## 2022-08-18 LAB — CBC
HCT: 49.1 % (ref 39.0–52.0)
Hemoglobin: 16.2 g/dL (ref 13.0–17.0)
MCH: 28.1 pg (ref 26.0–34.0)
MCHC: 33 g/dL (ref 30.0–36.0)
MCV: 85.2 fL (ref 80.0–100.0)
Platelets: 333 10*3/uL (ref 150–400)
RBC: 5.76 MIL/uL (ref 4.22–5.81)
RDW: 15.9 % — ABNORMAL HIGH (ref 11.5–15.5)
WBC: 13.1 10*3/uL — ABNORMAL HIGH (ref 4.0–10.5)
nRBC: 0 % (ref 0.0–0.2)

## 2022-08-18 LAB — GLUCOSE, CAPILLARY
Glucose-Capillary: 155 mg/dL — ABNORMAL HIGH (ref 70–99)
Glucose-Capillary: 120 mg/dL — ABNORMAL HIGH (ref 70–99)

## 2022-08-18 SURGERY — CYSTOSCOPY/URETEROSCOPY/HOLMIUM LASER/STENT PLACEMENT
Anesthesia: General | Laterality: Right

## 2022-08-18 MED ORDER — OXYCODONE-ACETAMINOPHEN 5-325 MG PO TABS: 1.0000 | ORAL_TABLET | ORAL | 0 refills | Status: DC | PRN

## 2022-08-18 MED ORDER — ONDANSETRON HCL 4 MG/2ML IJ SOLN
INTRAMUSCULAR | Status: AC
  Filled 2022-08-18: qty 2

## 2022-08-18 MED ORDER — SUGAMMADEX SODIUM 200 MG/2ML IV SOLN
INTRAVENOUS | Status: DC | PRN
  Administered 2022-08-18: 300 mg via INTRAVENOUS

## 2022-08-18 MED ORDER — PROPOFOL 10 MG/ML IV BOLUS
INTRAVENOUS | Status: AC
  Filled 2022-08-18: qty 20

## 2022-08-18 MED ORDER — FENTANYL CITRATE (PF) 100 MCG/2ML IJ SOLN
INTRAMUSCULAR | Status: AC
  Filled 2022-08-18: qty 2

## 2022-08-18 MED ORDER — CEFAZOLIN SODIUM-DEXTROSE 2-4 GM/100ML-% IV SOLN
INTRAVENOUS | Status: AC
  Filled 2022-08-18: qty 100

## 2022-08-18 MED ORDER — ONDANSETRON HCL 4 MG/2ML IJ SOLN: 4.0000 mg | Freq: Once | INTRAMUSCULAR | Status: DC | PRN

## 2022-08-18 MED ORDER — ONDANSETRON HCL 4 MG/2ML IJ SOLN
INTRAMUSCULAR | Status: DC | PRN
  Administered 2022-08-18: 4 mg via INTRAVENOUS

## 2022-08-18 MED ORDER — DEXAMETHASONE SODIUM PHOSPHATE 10 MG/ML IJ SOLN
INTRAMUSCULAR | Status: AC
  Filled 2022-08-18: qty 1

## 2022-08-18 MED ORDER — SUCCINYLCHOLINE CHLORIDE 200 MG/10ML IV SOSY
PREFILLED_SYRINGE | INTRAVENOUS | Status: DC | PRN
  Administered 2022-08-18: 120 mg via INTRAVENOUS

## 2022-08-18 MED ORDER — LACTATED RINGERS IV SOLN: INTRAVENOUS | Status: DC

## 2022-08-18 MED ORDER — ACETAMINOPHEN 10 MG/ML IV SOLN
INTRAVENOUS | Status: DC | PRN
  Administered 2022-08-18: 1000 mg via INTRAVENOUS

## 2022-08-18 MED ORDER — SUCCINYLCHOLINE CHLORIDE 200 MG/10ML IV SOSY
PREFILLED_SYRINGE | INTRAVENOUS | Status: AC
  Filled 2022-08-18: qty 10

## 2022-08-18 MED ORDER — SUGAMMADEX SODIUM 500 MG/5ML IV SOLN
INTRAVENOUS | Status: AC
  Filled 2022-08-18: qty 5

## 2022-08-18 MED ORDER — SODIUM CHLORIDE (PF) 0.9 % IJ SOLN
INTRAMUSCULAR | Status: AC
  Filled 2022-08-18: qty 10

## 2022-08-18 MED ORDER — CEPHALEXIN 500 MG PO CAPS: 500.0000 mg | ORAL_CAPSULE | Freq: Two times a day (BID) | ORAL | 0 refills | Status: AC

## 2022-08-18 MED ORDER — FENTANYL CITRATE (PF) 100 MCG/2ML IJ SOLN
INTRAMUSCULAR | Status: DC | PRN
  Administered 2022-08-18: 50 ug via INTRAVENOUS

## 2022-08-18 MED ORDER — DOCUSATE SODIUM 100 MG PO CAPS: 100.0000 mg | ORAL_CAPSULE | Freq: Every day | ORAL | 0 refills | Status: DC | PRN

## 2022-08-18 MED ORDER — SODIUM CHLORIDE 0.9 % IR SOLN
Status: DC | PRN
  Administered 2022-08-18: 3000 mL via INTRAVESICAL

## 2022-08-18 MED ORDER — PROPOFOL 10 MG/ML IV BOLUS
INTRAVENOUS | Status: DC | PRN
  Administered 2022-08-18: 170 mg via INTRAVENOUS

## 2022-08-18 MED ORDER — PHENYLEPHRINE 80 MCG/ML (10ML) SYRINGE FOR IV PUSH (FOR BLOOD PRESSURE SUPPORT)
PREFILLED_SYRINGE | INTRAVENOUS | Status: DC | PRN
  Administered 2022-08-18: 120 ug via INTRAVENOUS
  Administered 2022-08-18 (×2): 80 ug via INTRAVENOUS

## 2022-08-18 MED ORDER — ACETAMINOPHEN 10 MG/ML IV SOLN
INTRAVENOUS | Status: AC
  Filled 2022-08-18: qty 100

## 2022-08-18 MED ORDER — ROCURONIUM BROMIDE 10 MG/ML (PF) SYRINGE
PREFILLED_SYRINGE | INTRAVENOUS | Status: DC | PRN
  Administered 2022-08-18: 40 mg via INTRAVENOUS

## 2022-08-18 MED ORDER — PHENYLEPHRINE HCL-NACL 20-0.9 MG/250ML-% IV SOLN
INTRAVENOUS | Status: DC | PRN
  Administered 2022-08-18: 40 ug/min via INTRAVENOUS

## 2022-08-18 MED ORDER — LIDOCAINE HCL (PF) 2 % IJ SOLN
INTRAMUSCULAR | Status: AC
  Filled 2022-08-18: qty 5

## 2022-08-18 MED ORDER — CHLORHEXIDINE GLUCONATE 0.12 % MT SOLN
15.0000 mL | Freq: Once | OROMUCOSAL | Status: AC
  Administered 2022-08-18: 15 mL via OROMUCOSAL

## 2022-08-18 MED ORDER — FENTANYL CITRATE PF 50 MCG/ML IJ SOSY: 25.0000 ug | PREFILLED_SYRINGE | INTRAMUSCULAR | Status: DC | PRN

## 2022-08-18 MED ORDER — CEFAZOLIN IN SODIUM CHLORIDE 3-0.9 GM/100ML-% IV SOLN
3.0000 g | INTRAVENOUS | Status: AC
  Administered 2022-08-18: 2 g via INTRAVENOUS
  Administered 2022-08-18: 1 g via INTRAVENOUS
  Filled 2022-08-18: qty 100

## 2022-08-18 MED ORDER — ORAL CARE MOUTH RINSE: 15.0000 mL | Freq: Once | OROMUCOSAL | Status: AC

## 2022-08-18 MED ORDER — OXYCODONE HCL 5 MG PO TABS: 5.0000 mg | ORAL_TABLET | ORAL | 0 refills | Status: DC | PRN

## 2022-08-18 MED ORDER — ROCURONIUM BROMIDE 10 MG/ML (PF) SYRINGE
PREFILLED_SYRINGE | INTRAVENOUS | Status: AC
  Filled 2022-08-18: qty 10

## 2022-08-18 MED ORDER — DEXAMETHASONE SODIUM PHOSPHATE 10 MG/ML IJ SOLN
INTRAMUSCULAR | Status: DC | PRN
  Administered 2022-08-18: 4 mg via INTRAVENOUS

## 2022-08-18 MED ORDER — IOHEXOL 300 MG/ML  SOLN
INTRAMUSCULAR | Status: DC | PRN
  Administered 2022-08-18: 3 mL

## 2022-08-18 MED ORDER — CEFAZOLIN SODIUM 1 G IJ SOLR
INTRAMUSCULAR | Status: AC
  Filled 2022-08-18: qty 10

## 2022-08-18 SURGICAL SUPPLY — 25 items
APL SKNCLS STERI-STRIP NONHPOA (GAUZE/BANDAGES/DRESSINGS)
BAG URO CATCHER STRL LF (MISCELLANEOUS) ×1 IMPLANT
BASKET ZERO TIP NITINOL 2.4FR (BASKET) IMPLANT
BENZOIN TINCTURE PRP APPL 2/3 (GAUZE/BANDAGES/DRESSINGS) IMPLANT
BSKT STON RTRVL ZERO TP 2.4FR (BASKET) ×1
CATH URETERAL DUAL LUMEN 10F (MISCELLANEOUS) IMPLANT
CATH URETL OPEN 5X70 (CATHETERS) ×1 IMPLANT
CLOTH BEACON ORANGE TIMEOUT ST (SAFETY) ×1 IMPLANT
DRSG TEGADERM 2-3/8X2-3/4 SM (GAUZE/BANDAGES/DRESSINGS) IMPLANT
FIBER LASER MOSES 200 DFL (Laser) IMPLANT
GLOVE BIOGEL M 7.0 STRL (GLOVE) ×1 IMPLANT
GOWN STRL REUS W/ TWL XL LVL3 (GOWN DISPOSABLE) ×1 IMPLANT
GOWN STRL REUS W/TWL XL LVL3 (GOWN DISPOSABLE) ×3
GUIDEWIRE STR DUAL SENSOR (WIRE) ×2 IMPLANT
GUIDEWIRE ZIPWRE .038 STRAIGHT (WIRE) IMPLANT
KIT TURNOVER KIT A (KITS) IMPLANT
LASER FIB FLEXIVA PULSE ID 365 (Laser) IMPLANT
MANIFOLD NEPTUNE II (INSTRUMENTS) ×1 IMPLANT
PACK CYSTO (CUSTOM PROCEDURE TRAY) ×1 IMPLANT
SHEATH NAVIGATOR HD 12/14X46 (SHEATH) IMPLANT
STENT URET 6FRX28 CONTOUR (STENTS) IMPLANT
TRACTIP FLEXIVA PULS ID 200XHI (Laser) IMPLANT
TRACTIP FLEXIVA PULSE ID 200 (Laser)
TUBING CONNECTING 10 (TUBING) ×1 IMPLANT
TUBING UROLOGY SET (TUBING) ×1 IMPLANT

## 2022-08-18 NOTE — Discharge Instructions (Signed)
You were evaluated in the Emergency Department and after careful evaluation, we did not find any emergent condition requiring admission or further testing in the hospital.  Your exam/testing today is overall reassuring.  Symptoms seem to be due to the kidney stone.  Important that you keep your follow-up with urology today.  Continue taking the Flomax.  Use the oxycodone for more significant pain.  Continue Tylenol and Motrin for mild to moderate pain.  Please return to the Emergency Department if you experience any worsening of your condition.   Thank you for allowing [name] to be a part of your care.

## 2022-08-18 NOTE — Anesthesia Preprocedure Evaluation (Signed)
Anesthesia Evaluation  Patient identified by MRN, date of birth, ID band Patient awake    Reviewed: Allergy & Precautions, NPO status , Patient's Chart, lab work & pertinent test results, reviewed documented beta blocker date and time   Airway Mallampati: II  TM Distance: >3 FB Neck ROM: Full    Dental  (+) Dental Advisory Given, Partial Upper   Pulmonary asthma , sleep apnea and Continuous Positive Airway Pressure Ventilation ,    Pulmonary exam normal breath sounds clear to auscultation       Cardiovascular hypertension, Pt. on medications and Pt. on home beta blockers + CAD, + Peripheral Vascular Disease and +CHF  Normal cardiovascular exam Rhythm:Regular Rate:Normal     Neuro/Psych  Headaches, PSYCHIATRIC DISORDERS Anxiety Depression  Neuromuscular disease CVA    GI/Hepatic Neg liver ROS, GERD  ,  Endo/Other  diabetes, Type 2, Oral Hypoglycemic AgentsMorbid obesity  Renal/GU Renal disease (AKI)RIGHT URETERAL STONE   Prostate cancer     Musculoskeletal  (+) Arthritis ,   Abdominal   Peds  Hematology  (+) Blood dyscrasia (Plavix), ,   Anesthesia Other Findings Day of surgery medications reviewed with the patient.  Reproductive/Obstetrics                             Anesthesia Physical Anesthesia Plan  ASA: 3  Anesthesia Plan: General   Post-op Pain Management: Ofirmev IV (intra-op)*   Induction: Intravenous  PONV Risk Score and Plan: 3 and Dexamethasone and Ondansetron  Airway Management Planned: LMA  Additional Equipment:   Intra-op Plan:   Post-operative Plan: Extubation in OR  Informed Consent: I have reviewed the patients History and Physical, chart, labs and discussed the procedure including the risks, benefits and alternatives for the proposed anesthesia with the patient or authorized representative who has indicated his/her understanding and acceptance.      Dental advisory given  Plan Discussed with: CRNA  Anesthesia Plan Comments:         Anesthesia Quick Evaluation

## 2022-08-18 NOTE — Op Note (Signed)
Operative Note  Preoperative diagnosis:  1.  Right ureteral stone  Postoperative diagnosis: 1.  Right ureteral stone  Procedure(s): 1.  Cystoscopy 2. Right ureteroscopy with laser lithotripsy and basket extraction of stones 3. Right retrograde pyelogram 4. Right ureteral stent placement 5. Fluoroscopy with intraoperative interpretation  Surgeon: Rexene Alberts, MD  Assistants:  None  Anesthesia:  General  Complications:  None  EBL:  Minimal  Specimens: 1. Stones for stone analysis (to be done at [facility])  Drains/Catheters: 1.  Right 6Fr x 28cm ureteral stent with a tether string  Intraoperative findings:   Cystoscopy demonstrated no suspicious bladder lesions. Right ureteroscopy demonstrated 85m distal right ureteral stone.  This was fragmented and basket extracted. Right retrograde pyelogram with mild right hydronephrosis Successful right ureteral stent placement.  Indication:  [name] SMETHURSTis a 77y.o. male who presented today with severe onset right-sided flank pain.  CT A/P demonstrated 4 mm distal right ureteral stone.  He be brought back to the operating today for ureteroscopy and laser lithotripsy of this ureteral stone.  Description of procedure: After informed consent was obtained from the patient, the patient was identified and taken to the operating room and placed in the supine position.  General anesthesia was administered as well as perioperative IV antibiotics.  At the beginning of the case, a time-out was performed to properly identify the patient, the surgery to be performed, and the surgical site.  Sequential compression devices were applied to the lower extremities at the beginning of the case for DVT prophylaxis.  The patient was then placed in the dorsal lithotomy supine position, prepped and draped in sterile fashion.  We then passed the 21-French rigid cystoscope through the urethra and into the bladder under vision without any difficulty.   A systematic evaluation of the bladder revealed no evidence of any suspicious bladder lesions.  Ureteral orifices were in normal position.    A 0.038 sensor wire was then passed up to the level of the renal pelvis and secured to the drape as a safety wire. The ureteral catheter and cystoscope were removed, leaving the safety wire in place.   A semi-rigid ureteroscope was passed alongside the wire up the distal ureter which appeared normal.  I encountered a 4 mm distal right ureteral stone.  Using a 200 m holmium laser fiber, the stone was fragmented into 3 separate pieces.  These were then basket extracted without trauma.  I surveyed the ureter and noticed no further stones.  Right retrograde pyelogram was performed demonstrating mild right hydronephrosis and no filling defects, no extravasation of contrast.  I reintroduced the cystoscope and over the wire, passed a 6 [place] 26 cm right ureteral stent.  Curl was seen in the renal pelvis and bladder.  I left a tether string attached and taped this to the penis using a Tegaderm.  The bladder was emptied of stones.  The patient tolerated the procedure well and there was no complication. Patient was awoken from anesthesia and taken to the recovery room in stable condition. I was present and scrubbed for the entirety of the case.  Plan:  Patient will be discharged home.  Follow-up in the office in 1 month with renal ultrasound  Matt R. [email or web address]  Pager: [phone]

## 2022-08-18 NOTE — Anesthesia Postprocedure Evaluation (Signed)
Anesthesia Post Note  Patient: [name] [name]  Procedure(s) Performed: CYSTOSCOPY/RETROGRADE PYLOGRAM/POSSIBLE URETEROSCOPY/HOLMIUM LASER/STENT PLACEMENT (Right)     Patient location during evaluation: PACU Anesthesia Type: General Level of consciousness: awake and alert Pain management: pain level controlled Vital Signs Assessment: post-procedure vital signs reviewed and stable Respiratory status: spontaneous breathing, nonlabored ventilation, respiratory function stable and patient connected to nasal cannula oxygen Cardiovascular status: blood pressure returned to baseline and stable Postop Assessment: no apparent nausea or vomiting Anesthetic complications: no   No notable events documented.  Last Vitals:  Vitals:   08/18/22 1845 08/18/22 1900  BP: 123/66 133/61  Pulse: 76 78  Resp: 15 12  Temp:    SpO2: 91% 91%    Last Pain:  Vitals:   08/18/22 1900  TempSrc:   PainSc: 0-No pain                 Santa Lighter
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
## 2022-08-18 NOTE — Discharge Instructions (Signed)
[facility] [phone] Post Ureteroscopy With or Without Stent Instructions  Definitions:  Ureter: The duct that transports urine from the kidney to the bladder. Stent:   A plastic hollow tube that is placed into the ureter, from the kidney to the bladder to prevent the ureter from swelling shut.  GENERAL INSTRUCTIONS:  Despite the fact that no skin incisions were used, the area around the ureter and bladder is raw and irritated. The stent is a foreign body which will further irritate the bladder wall. This irritation is manifested by increased frequency of urination, both day and night, and by an increase in the urge to urinate. In some, the urge to urinate is present almost always. Sometimes the urge is strong enough that you may not be able to stop yourself from urinating. The only real cure is to remove the stent and then give time for the bladder wall to heal which can't be done until the danger of the ureter swelling shut has passed, which varies.  You may see some blood in your urine while the stent is in place and a few days afterwards. Do not be alarmed, even if the urine was clear for a while. Get off your feet and drink lots of fluids until clearing occurs. If you start to pass clots or don't improve, call us.  DIET: You may return to your normal diet immediately. Because of the raw surface of your bladder, alcohol, spicy foods, acid type foods and drinks with caffeine may cause irritation or frequency and should be used in moderation. To keep your urine flowing freely and to avoid constipation, drink plenty of fluids during the day ( 8-10 glasses ). Tip: Avoid cranberry juice because it is very acidic.  ACTIVITY: Your physical activity doesn't need to be restricted. However, if you are very active, you may see some blood in your urine. We suggest that you reduce your activity under these circumstances until the bleeding has stopped.  BOWELS: It is important to  keep your bowels regular during the postoperative period. Straining with bowel movements can cause bleeding. A bowel movement every other day is reasonable. Use a mild laxative if needed, such as Milk of Magnesia 2-3 tablespoons, or 2 Dulcolax tablets. Call if you continue to have problems. If you have been taking narcotics for pain, before, during or after your surgery, you may be constipated. Take a laxative if necessary.   MEDICATION: You should resume your pre-surgery medications unless told not to. In addition you will often be given an antibiotic to prevent infection. These should be taken as prescribed until the bottles are finished unless you are having an unusual reaction to one of the drugs.  PROBLEMS YOU SHOULD REPORT TO [name]: Fevers over 100.5 Fahrenheit. Heavy bleeding, or clots ( See above notes about blood in urine ). Inability to urinate. Drug reactions ( hives, rash, nausea, vomiting, diarrhea ). Severe burning or pain with urination that is not improving.  FOLLOW-UP: You will need a follow-up appointment to monitor your progress. Call for this appointment at the number listed above. Usually the first appointment will be about three to fourteen days after your surgery.  You have a right ureteral stent draining your kidney.  This attached to a string.  He may remove the stent by pulling the attached string on Monday morning.  Hold blood thinners tomorrow on Friday.  He may resume on Saturday if no significant bleeding.

## 2022-08-18 NOTE — Transfer of Care (Signed)
Immediate Anesthesia Transfer of Care Note  Patient: [name] [name]  Procedure(s) Performed: CYSTOSCOPY/RETROGRADE PYLOGRAM/POSSIBLE URETEROSCOPY/HOLMIUM LASER/STENT PLACEMENT (Right)  Patient Location: PACU  Anesthesia Type:General  Level of Consciousness: awake, alert , and patient cooperative  Airway & Oxygen Therapy: Patient Spontanous Breathing and Patient connected to face mask oxygen  Post-op Assessment: Report given to RN and Post -op Vital signs reviewed and stable  Post vital signs: Reviewed and stable  Last Vitals:  Vitals Value Taken Time  BP 156/70 08/18/22 1825  Temp 36.7 C 08/18/22 1825  Pulse 77 08/18/22 1827  Resp 17 08/18/22 1827  SpO2 96 % 08/18/22 1827  Vitals shown include unvalidated device data.  Last Pain:  Vitals:   08/18/22 1511  TempSrc: Oral  PainSc: 2          Complications: No notable events documented.

## 2022-08-18 NOTE — Anesthesia Procedure Notes (Signed)
Procedure Name: Intubation Date/Time: 08/18/2022 5:37 PM  Performed by: Eben Burow, CRNAPre-anesthesia Checklist: Patient identified, Emergency Drugs available, Suction available, Patient being monitored and Timeout performed Patient Re-evaluated:Patient Re-evaluated prior to induction Oxygen Delivery Method: Circle system utilized Preoxygenation: Pre-oxygenation with 100% oxygen Induction Type: IV induction and Rapid sequence Laryngoscope Size: Mac and 4 Grade View: Grade I Tube type: Oral Tube size: 7.5 mm Number of attempts: 1 Airway Equipment and Method: Stylet Placement Confirmation: ETT inserted through vocal cords under direct vision, positive ETCO2 and breath sounds checked- equal and bilateral Secured at: 24 cm Tube secured with: Tape Dental Injury: Teeth and Oropharynx as per pre-operative assessment

## 2022-08-19 ENCOUNTER — Encounter (HOSPITAL_COMMUNITY): Payer: Self-pay | Admitting: Urology

## 2022-08-22 ENCOUNTER — Ambulatory Visit: Payer: Medicare PPO | Admitting: Interventional Cardiology

## 2022-08-22 NOTE — Progress Notes (Unsigned)
Chief Complaint:   OBESITY [name] [name] is here to discuss his progress with his obesity treatment plan along with follow-up of his obesity related diagnoses. [name] [name] is on the Category 3 Plan and states he is following his eating plan approximately 95% of the time. [name] [name] states he is walking for 20 minutes 4 times per week.  Today's visit was #: 51 Starting weight: 340 lbs Starting date: 12/28/2016 Today's weight: 316 lbs Today's date: 08/11/2022 Total lbs lost to date: 24 Total lbs lost since last in-office visit: 1  Interim History: [name] [name] has done very well with avoiding holiday weight gain.  He is doing better with following his eating plan for now.  Subjective:   1. Flank pain [name] [name] has had increased flank pain for 3 days.  He was seen in urgent care 3 days ago and he had large blood in his urine.  No further testing has been done, and his pain has improved slightly with pain medications but has increased again today.  He has a history of previous kidney stones with similar pain.  2. Epigastric pain [name] [name] notes a knot in his epigastric area and increased GERD.  He is belching food from last night still.  His Ozempic was increased recently.  He has no history of gastroparesis but his diabetes mellitus has been uncontrolled recently.  3. Type 2 diabetes mellitus with other circulatory complication, without long-term current use of insulin ([name] [name]) [name] [name] has been on Ozempic 1 mg every other week and recently increased to weekly dose (he was on a lower dose due to finances).  His glucose readings have improved significantly and are averaging in the 150's, down from 190-200s.  He is having some epigastric pain of uncertain origin.  4. Vitamin D deficiency [name] [name] is on vitamin D, and he is stable with no side effects noted.  Assessment/Plan:   1. Flank pain [name] [name] was sent to the med Center at [place] for further evaluation and urgent CT scan.  We will follow-up with results in  [place]pic.  2. Epigastric pain [name] [name] is on his way to the emergency department and he will likely get imaging.  He has been on a GLP-one for a long time, but he is at high risk of this developing.  He is to hold his Ozempic until his workup for the above is finished.  We will follow-up closely.  3. Type 2 diabetes mellitus with other circulatory complication, without long-term current use of insulin ([name] [name]) [name] [name] is to hold Ozempic while being evaluated and we will follow-up afterwards (we will send a refill in case we restart).   - Semaglutide, 1 MG/DOSE, 4 MG/3ML SOPN; Inject 1 mg as directed once a week.  Dispense: 3 mL; Refill: 0  4. Vitamin D deficiency [name] [name] will continue prescription vitamin D, and we will refill for 1 month.  - Vitamin D, Ergocalciferol, (DRISDOL) 1.25 MG (50000 UNIT) CAPS capsule; Take 1 capsule (50,000 Units total) by mouth every 14 (fourteen) days.  Dispense: 2 capsule; Refill: 0  5. Obesity, Current BMI 41.7 [name] [name] is currently in the action stage of change. As such, his goal is to continue with weight loss efforts. He has agreed to the Category 3 Plan.   Exercise goals: As is.   Behavioral modification strategies: increasing water intake.  [name] [name] has agreed to follow-up with our clinic in 4 weeks. He was informed of the importance of frequent follow-up visits to maximize his success with intensive lifestyle modifications for his multiple  health conditions.   Objective:   Blood pressure (!) 140/75, pulse 70, temperature 97.7 F (36.5 C), height '6\' 1"'$  (1.854 m), weight (!) 316 lb (143.3 kg), SpO2 96 %. Body mass index is 41.69 kg/m.  General: Cooperative, alert, well developed, in no acute distress. HEENT: Conjunctivae and lids unremarkable. Cardiovascular: Regular rhythm.  Lungs: Normal work of breathing. Neurologic: No focal deficits.   Lab Results  Component Value Date   CREATININE 1.69 (H) 08/18/2022   BUN 27 (H) 08/18/2022   NA 139 08/18/2022   K  3.7 08/18/2022   CL 103 08/18/2022   CO2 23 08/18/2022   Lab Results  Component Value Date   ALT 56 (H) 08/18/2022   AST 24 08/18/2022   ALKPHOS 49 08/18/2022   BILITOT 0.8 08/18/2022   Lab Results  Component Value Date   HGBA1C 10.1 (H) 03/07/2022   HGBA1C 10.9 (H) 02/07/2022   HGBA1C 6.4 09/14/2021   HGBA1C 6.3 (H) 02/22/2021   HGBA1C 6.6 (H) 09/16/2020   Lab Results  Component Value Date   INSULIN 22.7 03/07/2022   INSULIN 12.8 02/07/2022   INSULIN 11.7 02/22/2021   INSULIN 9.1 09/16/2020   INSULIN 10.0 08/14/2019   Lab Results  Component Value Date   TSH 1.260 03/07/2022   Lab Results  Component Value Date   CHOL 113 04/18/2022   HDL 38 (L) 04/18/2022   LDLCALC 58 04/18/2022   LDLDIRECT 148 (H) 11/15/2007   TRIG 83 04/18/2022   CHOLHDL 3.0 04/18/2022   Lab Results  Component Value Date   VD25OH 60.9 03/07/2022   VD25OH 60.3 02/07/2022   VD25OH 70.4 02/22/2021   Lab Results  Component Value Date   WBC 13.1 (H) 08/18/2022   HGB 16.2 08/18/2022   HCT 49.1 08/18/2022   MCV 85.2 08/18/2022   PLT 333 08/18/2022   No results found for: "IRON", "TIBC", "FERRITIN"  Attestation Statements:   Reviewed by clinician on day of visit: allergies, medications, problem list, medical history, surgical history, family history, social history, and previous encounter notes.  I have personally spent 50 minutes total time today in preparation, patient care, and documentation for this visit, including the following: review of clinical lab tests; review of medical tests/procedures/services.   I, Trixie Dredge, am acting as transcriptionist for [name], MD.  I have reviewed the above documentation for accuracy and completeness, and I agree with the above. -  [name], MD

## 2022-08-24 ENCOUNTER — Other Ambulatory Visit: Payer: Self-pay | Admitting: Student

## 2022-08-24 ENCOUNTER — Ambulatory Visit (INDEPENDENT_AMBULATORY_CARE_PROVIDER_SITE_OTHER): Payer: Medicare PPO | Admitting: Family Medicine

## 2022-08-24 ENCOUNTER — Other Ambulatory Visit: Payer: Self-pay

## 2022-08-24 DIAGNOSIS — E1159 Type 2 diabetes mellitus with other circulatory complications: Secondary | ICD-10-CM
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
## 2022-08-31 ENCOUNTER — Ambulatory Visit: Payer: Medicare PPO | Attending: Interventional Cardiology | Admitting: Interventional Cardiology

## 2022-08-31 ENCOUNTER — Encounter: Payer: Self-pay | Admitting: Interventional Cardiology

## 2022-08-31 VITALS — BP 126/72 | HR 67 | Ht 73.0 in | Wt 314.6 lb

## 2022-08-31 DIAGNOSIS — I25119 Atherosclerotic heart disease of native coronary artery with unspecified angina pectoris: Secondary | ICD-10-CM | POA: Diagnosis not present

## 2022-08-31 DIAGNOSIS — I77810 Thoracic aortic ectasia: Secondary | ICD-10-CM

## 2022-08-31 DIAGNOSIS — I10 Essential (primary) hypertension: Secondary | ICD-10-CM

## 2022-08-31 DIAGNOSIS — E1159 Type 2 diabetes mellitus with other circulatory complications: Secondary | ICD-10-CM

## 2022-08-31 DIAGNOSIS — E785 Hyperlipidemia, unspecified: Secondary | ICD-10-CM | POA: Diagnosis not present

## 2022-08-31 NOTE — Patient Instructions (Signed)

## 2022-09-05 DIAGNOSIS — H0102A Squamous blepharitis right eye, upper and lower eyelids: Secondary | ICD-10-CM | POA: Diagnosis not present

## 2022-09-05 DIAGNOSIS — H0102B Squamous blepharitis left eye, upper and lower eyelids: Secondary | ICD-10-CM | POA: Diagnosis not present

## 2022-09-05 DIAGNOSIS — E119 Type 2 diabetes mellitus without complications: Secondary | ICD-10-CM | POA: Diagnosis not present

## 2022-09-05 DIAGNOSIS — H34212 Partial retinal artery occlusion, left eye: Secondary | ICD-10-CM | POA: Diagnosis not present

## 2022-09-05 DIAGNOSIS — H43811 Vitreous degeneration, right eye: Secondary | ICD-10-CM | POA: Diagnosis not present

## 2022-09-05 DIAGNOSIS — Z961 Presence of intraocular lens: Secondary | ICD-10-CM | POA: Diagnosis not present

## 2022-09-05 LAB — HM DIABETES EYE EXAM

## 2022-09-09 DIAGNOSIS — I10 Essential (primary) hypertension: Secondary | ICD-10-CM | POA: Diagnosis not present

## 2022-09-09 DIAGNOSIS — E119 Type 2 diabetes mellitus without complications: Secondary | ICD-10-CM | POA: Diagnosis not present

## 2022-09-16 ENCOUNTER — Other Ambulatory Visit: Payer: Self-pay | Admitting: Student

## 2022-09-16 DIAGNOSIS — E7849 Other hyperlipidemia: Secondary | ICD-10-CM

## 2022-09-16 DIAGNOSIS — E1159 Type 2 diabetes mellitus with other circulatory complications: Secondary | ICD-10-CM

## 2022-09-19 DIAGNOSIS — N202 Calculus of kidney with calculus of ureter: Secondary | ICD-10-CM | POA: Diagnosis not present

## 2022-09-19 DIAGNOSIS — E119 Type 2 diabetes mellitus without complications: Secondary | ICD-10-CM | POA: Diagnosis not present

## 2022-09-19 DIAGNOSIS — I10 Essential (primary) hypertension: Secondary | ICD-10-CM | POA: Diagnosis not present

## 2022-09-19 NOTE — Telephone Encounter (Signed)
Patient returns call to nurse line regarding rx refill.   Please advise.   Talbot Grumbling, RN

## 2022-09-21 ENCOUNTER — Ambulatory Visit (INDEPENDENT_AMBULATORY_CARE_PROVIDER_SITE_OTHER): Payer: Medicare PPO | Admitting: Family Medicine

## 2022-09-21 ENCOUNTER — Encounter (INDEPENDENT_AMBULATORY_CARE_PROVIDER_SITE_OTHER): Payer: Self-pay | Admitting: Family Medicine

## 2022-09-21 VITALS — BP 128/73 | HR 70 | Temp 97.5°F | Ht 73.0 in | Wt 315.0 lb

## 2022-09-21 DIAGNOSIS — E669 Obesity, unspecified: Secondary | ICD-10-CM | POA: Diagnosis not present

## 2022-09-21 DIAGNOSIS — E1159 Type 2 diabetes mellitus with other circulatory complications: Secondary | ICD-10-CM

## 2022-09-21 DIAGNOSIS — Z7985 Long-term (current) use of injectable non-insulin antidiabetic drugs: Secondary | ICD-10-CM | POA: Diagnosis not present

## 2022-09-21 DIAGNOSIS — E559 Vitamin D deficiency, unspecified: Secondary | ICD-10-CM | POA: Diagnosis not present

## 2022-09-21 DIAGNOSIS — Z6841 Body Mass Index (BMI) 40.0 and over, adult: Secondary | ICD-10-CM | POA: Diagnosis not present

## 2022-09-22 ENCOUNTER — Encounter (INDEPENDENT_AMBULATORY_CARE_PROVIDER_SITE_OTHER): Payer: Self-pay | Admitting: Family Medicine

## 2022-09-22 LAB — CMP14+EGFR
ALT: 44 IU/L (ref 0–44)
AST: 23 IU/L (ref 0–40)
Albumin/Globulin Ratio: 2.5 — ABNORMAL HIGH (ref 1.2–2.2)
Albumin: 4.5 g/dL (ref 3.8–4.8)
Alkaline Phosphatase: 62 IU/L (ref 44–121)
BUN/Creatinine Ratio: 16 (ref 10–24)
BUN: 18 mg/dL (ref 8–27)
Bilirubin Total: 0.6 mg/dL (ref 0.0–1.2)
CO2: 24 mmol/L (ref 20–29)
Calcium: 9.3 mg/dL (ref 8.6–10.2)
Chloride: 100 mmol/L (ref 96–106)
Creatinine, Ser: 1.12 mg/dL (ref 0.76–1.27)
Globulin, Total: 1.8 g/dL (ref 1.5–4.5)
Glucose: 163 mg/dL — ABNORMAL HIGH (ref 70–99)
Potassium: 4.4 mmol/L (ref 3.5–5.2)
Sodium: 138 mmol/L (ref 134–144)
Total Protein: 6.3 g/dL (ref 6.0–8.5)
eGFR: 69 mL/min/{1.73_m2} (ref >59)

## 2022-09-22 LAB — HEMOGLOBIN A1C
Est. average glucose Bld gHb Est-mCnc: 166 mg/dL
Hgb A1c MFr Bld: 7.4 % — ABNORMAL HIGH (ref 4.8–5.6)

## 2022-09-22 LAB — VITAMIN D 25 HYDROXY (VIT D DEFICIENCY, FRACTURES): Vit D, 25-Hydroxy: 65 ng/mL (ref 30.0–100.0)
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
## 2022-09-29 NOTE — Patient Instructions (Addendum)
Please continue using your CPAP regularly. While your insurance requires that you use CPAP at least 4 hours each night on 70% of the nights, I recommend, that you not skip any nights and use it throughout the night if you can. Getting used to CPAP and staying with the treatment long term does take time and patience and discipline. Untreated obstructive sleep apnea when it is moderate to severe can have an adverse impact on cardiovascular health and raise her risk for heart disease, arrhythmias, hypertension, congestive heart failure, stroke and diabetes. Untreated obstructive sleep apnea causes sleep disruption, nonrestorative sleep, and sleep deprivation. This can have an impact on your day to day functioning and cause daytime sleepiness and impairment of cognitive function, memory loss, mood disturbance, and problems focussing. Using CPAP regularly can improve these symptoms.  We can start the process of getting a new CPAP machine. I will order a home sleep study to confirm diagnosis of sleep apnea and we can order a new machine when you are ready.   Follow up in 1 year

## 2022-10-03 ENCOUNTER — Ambulatory Visit: Payer: Medicare PPO | Admitting: Family Medicine

## 2022-10-03 ENCOUNTER — Encounter: Payer: Self-pay | Admitting: Family Medicine

## 2022-10-03 VITALS — BP 154/84 | HR 72 | Ht 73.0 in | Wt 319.5 lb

## 2022-10-03 DIAGNOSIS — G4733 Obstructive sleep apnea (adult) (pediatric): Secondary | ICD-10-CM | POA: Diagnosis not present

## 2022-10-04 NOTE — Progress Notes (Signed)
Chief Complaint:   OBESITY [name] [name] is here to discuss his progress with his obesity treatment plan along with follow-up of his obesity related diagnoses. [name] [name] is on the Category 3 Plan and states he is following his eating plan approximately 75% of the time. [name] [name] states he is walking for 15-20 minutes 4 times per week.  Today's visit was #: 93 Starting weight: 340 lbs Starting date: 12/28/2016 Today's weight: 315 lbs Today's date: 09/21/2022 Total lbs lost to date: 25 Total lbs lost since last in-office visit: 1  Interim History: [name] [name] continues to lose weight but he has had an especially challenging time due to frequent hospital and doctor visits for multiple kidney stones.  Subjective:   1. Vitamin D deficiency [name] [name] is on vitamin D and his last level was at goal.  He has had more kidney stones lately and may need his vitamin D decreased.  2. Type 2 diabetes mellitus with other circulatory complication, without long-term current use of insulin ([name] [name]) [name] [name] notes increased GERD symptoms when back on Ozempic at 1 mg.  His fasting blood sugars this morning was 146 which is an improvement.  Assessment/Plan:   1. Vitamin D deficiency Shamus is to hold vitamin D, and we will check labs today and follow-up at his next visit.  - VITAMIN D 25 Hydroxy (Vit-D Deficiency, Fractures)  2. Type 2 diabetes mellitus with other circulatory complication, without long-term current use of insulin (HCC) We will check labs today.  [name] [name] will continue metformin and Ozempic.  - Hemoglobin A1c - CMP14+EGFR  3. BMI 40.0-44.9, adult ([name] [name])  4. Obesity, Beginning BMI 44.86 [name] [name] is currently in the action stage of change. As such, his goal is to continue with weight loss efforts. He has agreed to the Category 3 Plan.   [name] [name] was strongly encouraged to increase his water intake with his kidney stones. He agreed to try.   Exercise goals: As is.   Behavioral modification strategies:  increasing water intake.  [name] [name] has agreed to follow-up with our clinic in 3 to 4 weeks. He was informed of the importance of frequent follow-up visits to maximize his success with intensive lifestyle modifications for his multiple health conditions.   [name] [name] was informed we would discuss his lab results at his next visit unless there is a critical issue that needs to be addressed sooner. [name] [name] agreed to keep his next visit at the agreed upon time to discuss these results.  Objective:   Blood pressure 128/73, pulse 70, temperature (!) 97.5 F (36.4 C), height '6\' 1"'$  (1.854 m), weight (!) 315 lb (142.9 kg), SpO2 94 %. Body mass index is 41.56 kg/m.  Lab Results  Component Value Date   CREATININE 1.12 09/21/2022   BUN 18 09/21/2022   NA 138 09/21/2022   K 4.4 09/21/2022   CL 100 09/21/2022   CO2 24 09/21/2022   Lab Results  Component Value Date   ALT 44 09/21/2022   AST 23 09/21/2022   ALKPHOS 62 09/21/2022   BILITOT 0.6 09/21/2022   Lab Results  Component Value Date   HGBA1C 7.4 (H) 09/21/2022   HGBA1C 10.1 (H) 03/07/2022   HGBA1C 10.9 (H) 02/07/2022   HGBA1C 6.4 09/14/2021   HGBA1C 6.3 (H) 02/22/2021   Lab Results  Component Value Date   INSULIN 22.7 03/07/2022   INSULIN 12.8 02/07/2022   INSULIN 11.7 02/22/2021   INSULIN 9.1 09/16/2020   INSULIN 10.0 08/14/2019   Lab Results  Component Value Date  TSH 1.260 03/07/2022   Lab Results  Component Value Date   CHOL 113 04/18/2022   HDL 38 (L) 04/18/2022   LDLCALC 58 04/18/2022   LDLDIRECT 148 (H) 11/15/2007   TRIG 83 04/18/2022   CHOLHDL 3.0 04/18/2022   Lab Results  Component Value Date   VD25OH 65.0 09/21/2022   VD25OH 60.9 03/07/2022   VD25OH 60.3 02/07/2022   Lab Results  Component Value Date   WBC 13.1 (H) 08/18/2022   HGB 16.2 08/18/2022   HCT 49.1 08/18/2022   MCV 85.2 08/18/2022   PLT 333 08/18/2022   No results found for: "IRON", "TIBC", "FERRITIN"  Attestation Statements:   Reviewed  by clinician on day of visit: allergies, medications, problem list, medical history, surgical history, family history, social history, and previous encounter notes.   I, Trixie Dredge, am acting as transcriptionist for [name], MD.  I have reviewed the above documentation for accuracy and completeness, and I agree with the above. -  [name], MD

## 2022-10-07 ENCOUNTER — Encounter (INDEPENDENT_AMBULATORY_CARE_PROVIDER_SITE_OTHER): Payer: Self-pay | Admitting: Family Medicine

## 2022-10-11 ENCOUNTER — Other Ambulatory Visit: Payer: Self-pay | Admitting: Family Medicine

## 2022-10-11 ENCOUNTER — Other Ambulatory Visit: Payer: Self-pay | Admitting: Physician Assistant

## 2022-10-11 DIAGNOSIS — N3941 Urge incontinence: Secondary | ICD-10-CM

## 2022-10-11 NOTE — Telephone Encounter (Signed)
Patient of Dr. Varanasi. Please review for refill. Thank you!  

## 2022-10-18 DIAGNOSIS — I10 Essential (primary) hypertension: Secondary | ICD-10-CM | POA: Diagnosis not present

## 2022-10-18 DIAGNOSIS — E119 Type 2 diabetes mellitus without complications: Secondary | ICD-10-CM | POA: Diagnosis not present

## 2022-10-19 ENCOUNTER — Encounter (INDEPENDENT_AMBULATORY_CARE_PROVIDER_SITE_OTHER): Payer: Self-pay | Admitting: Family Medicine

## 2022-10-19 ENCOUNTER — Ambulatory Visit (INDEPENDENT_AMBULATORY_CARE_PROVIDER_SITE_OTHER): Payer: Medicare PPO | Admitting: Family Medicine

## 2022-10-19 VITALS — BP 147/72 | HR 69 | Temp 97.4°F | Ht 73.0 in | Wt 313.0 lb

## 2022-10-19 DIAGNOSIS — E559 Vitamin D deficiency, unspecified: Secondary | ICD-10-CM | POA: Diagnosis not present

## 2022-10-19 DIAGNOSIS — E1159 Type 2 diabetes mellitus with other circulatory complications: Secondary | ICD-10-CM | POA: Diagnosis not present

## 2022-10-19 DIAGNOSIS — E669 Obesity, unspecified: Secondary | ICD-10-CM

## 2022-10-19 DIAGNOSIS — Z6841 Body Mass Index (BMI) 40.0 and over, adult: Secondary | ICD-10-CM

## 2022-10-19 DIAGNOSIS — Z7985 Long-term (current) use of injectable non-insulin antidiabetic drugs: Secondary | ICD-10-CM | POA: Diagnosis not present

## 2022-10-24 ENCOUNTER — Other Ambulatory Visit: Payer: Self-pay

## 2022-10-24 ENCOUNTER — Other Ambulatory Visit: Payer: Self-pay | Admitting: Student

## 2022-10-24 DIAGNOSIS — E1159 Type 2 diabetes mellitus with other circulatory complications: Secondary | ICD-10-CM

## 2022-10-24 DIAGNOSIS — I10 Essential (primary) hypertension: Secondary | ICD-10-CM

## 2022-10-24 DIAGNOSIS — F3289 Other specified depressive episodes: Secondary | ICD-10-CM

## 2022-10-24 DIAGNOSIS — E7849 Other hyperlipidemia: Secondary | ICD-10-CM

## 2022-10-24 NOTE — Progress Notes (Signed)
Chief Complaint:   OBESITY [name] [name] is here to discuss his progress with his obesity treatment plan along with follow-up of his obesity related diagnoses. [name] [name] is on the Category 3 Plan and states he is following his eating plan approximately 90% of the time. [name] [name] states he is walking 4 times per week.   Today's visit was #: 95 Starting weight: 340 lbs Starting date: 12/28/2016 Today's weight: 313 lbs Today's date: 10/19/2022 Total lbs lost to date: 27 Total lbs lost since last in-office visit: 2  Interim History: [name] [name] has done more traveling more recently. His hunger is better controlled, and he is doing better with portion control. He is getting more active this Spring.   Subjective:   1. Type 2 diabetes mellitus with other circulatory complication, without long-term current use of insulin (HCC) [name] [name] is working on diet and weight loss. He is on Ozempic 1 mg samples.   2. Vitamin D deficiency [name] [name] is on Vitamin D every other week. His last level was at goal.   Assessment/Plan:   1. Type 2 diabetes mellitus with other circulatory complication, without long-term current use of insulin ([place]) [name] [name] will continue Ozempic 1 mg. He is to fill out the patient assistance paperwork.   2. Vitamin D deficiency [name] [name] agreed to change Vitamin D to OTC Vitamin D 5,000 IU daily.   3. BMI 40.0-44.9, adult ([place])  4. Obesity, Beginning BMI 44.86 [name] [name] is currently in the action stage of change. As such, his goal is to continue with weight loss efforts. He has agreed to the Category 3 Plan.   Exercise goals: As is.   Behavioral modification strategies: increasing lean protein intake.  [name] [name] has agreed to follow-up with our clinic in 3 to 4 weeks. He was informed of the importance of frequent follow-up visits to maximize his success with intensive lifestyle modifications for his multiple health conditions.   Objective:   Blood pressure (!) 147/72, pulse 69, temperature (!)  97.4 F (36.3 C), height 6\' 1"  (1.854 m), weight (!) 313 lb (142 kg), SpO2 96 %. Body mass index is 41.3 kg/m.  Lab Results  Component Value Date   CREATININE 1.12 09/21/2022   BUN 18 09/21/2022   NA 138 09/21/2022   K 4.4 09/21/2022   CL 100 09/21/2022   CO2 24 09/21/2022   Lab Results  Component Value Date   ALT 44 09/21/2022   AST 23 09/21/2022   ALKPHOS 62 09/21/2022   BILITOT 0.6 09/21/2022   Lab Results  Component Value Date   HGBA1C 7.4 (H) 09/21/2022   HGBA1C 10.1 (H) 03/07/2022   HGBA1C 10.9 (H) 02/07/2022   HGBA1C 6.4 09/14/2021   HGBA1C 6.3 (H) 02/22/2021   Lab Results  Component Value Date   INSULIN 22.7 03/07/2022   INSULIN 12.8 02/07/2022   INSULIN 11.7 02/22/2021   INSULIN 9.1 09/16/2020   INSULIN 10.0 08/14/2019   Lab Results  Component Value Date   TSH 1.260 03/07/2022   Lab Results  Component Value Date   CHOL 113 04/18/2022   HDL 38 (L) 04/18/2022   LDLCALC 58 04/18/2022   LDLDIRECT 148 (H) 11/15/2007   TRIG 83 04/18/2022   CHOLHDL 3.0 04/18/2022   Lab Results  Component Value Date   VD25OH 65.0 09/21/2022   VD25OH 60.9 03/07/2022   VD25OH 60.3 02/07/2022   Lab Results  Component Value Date   WBC 13.1 (H) 08/18/2022   HGB 16.2 08/18/2022   HCT 49.1 08/18/2022  MCV 85.2 08/18/2022   PLT 333 08/18/2022   No results found for: "IRON", "TIBC", "FERRITIN"  Attestation Statements:   Reviewed by clinician on day of visit: allergies, medications, problem list, medical history, surgical history, family history, social history, and previous encounter notes.   I, Trixie Dredge, am acting as transcriptionist for [name], MD.  I have reviewed the above documentation for accuracy and completeness, and I agree with the above. -  [name], MD

## 2022-10-25 MED ORDER — ESCITALOPRAM OXALATE 20 MG PO TABS: 20.0000 mg | ORAL_TABLET | Freq: Every day | ORAL | 5 refills | Status: DC

## 2022-10-25 MED ORDER — BUPROPION HCL ER (SR) 200 MG PO TB12: 200.0000 mg | ORAL_TABLET | Freq: Every morning | ORAL | 3 refills | Status: DC

## 2022-10-25 MED ORDER — ROSUVASTATIN CALCIUM 40 MG PO TABS: ORAL_TABLET | ORAL | 0 refills | Status: DC

## 2022-10-25 MED ORDER — GLIMEPIRIDE 2 MG PO TABS: 2.0000 mg | ORAL_TABLET | Freq: Every day | ORAL | 0 refills | Status: DC

## 2022-10-25 MED ORDER — METOPROLOL SUCCINATE ER 25 MG PO TB24: 12.5000 mg | ORAL_TABLET | Freq: Every day | ORAL | 0 refills | Status: DC

## 2022-10-30 DIAGNOSIS — E119 Type 2 diabetes mellitus without complications: Secondary | ICD-10-CM | POA: Diagnosis not present

## 2022-10-30 DIAGNOSIS — I10 Essential (primary) hypertension: Secondary | ICD-10-CM | POA: Diagnosis not present

## 2022-11-08 ENCOUNTER — Other Ambulatory Visit (INDEPENDENT_AMBULATORY_CARE_PROVIDER_SITE_OTHER): Payer: Self-pay | Admitting: Family Medicine

## 2022-11-08 DIAGNOSIS — E559 Vitamin D deficiency, unspecified: Secondary | ICD-10-CM

## 2022-11-10 ENCOUNTER — Encounter (INDEPENDENT_AMBULATORY_CARE_PROVIDER_SITE_OTHER): Payer: Self-pay | Admitting: Family Medicine

## 2022-11-15 ENCOUNTER — Other Ambulatory Visit: Payer: Self-pay | Admitting: *Deleted

## 2022-11-15 DIAGNOSIS — I6523 Occlusion and stenosis of bilateral carotid arteries: Secondary | ICD-10-CM

## 2022-11-15 DIAGNOSIS — I6522 Occlusion and stenosis of left carotid artery: Secondary | ICD-10-CM

## 2022-11-16 ENCOUNTER — Encounter (INDEPENDENT_AMBULATORY_CARE_PROVIDER_SITE_OTHER): Payer: Self-pay | Admitting: Family Medicine

## 2022-11-16 ENCOUNTER — Ambulatory Visit (INDEPENDENT_AMBULATORY_CARE_PROVIDER_SITE_OTHER): Payer: Medicare HMO | Admitting: Family Medicine

## 2022-11-16 VITALS — BP 177/70 | HR 77 | Temp 97.9°F | Ht 73.0 in | Wt 311.0 lb

## 2022-11-16 DIAGNOSIS — Z6841 Body Mass Index (BMI) 40.0 and over, adult: Secondary | ICD-10-CM | POA: Diagnosis not present

## 2022-11-16 DIAGNOSIS — Z7985 Long-term (current) use of injectable non-insulin antidiabetic drugs: Secondary | ICD-10-CM

## 2022-11-16 DIAGNOSIS — E559 Vitamin D deficiency, unspecified: Secondary | ICD-10-CM | POA: Diagnosis not present

## 2022-11-16 DIAGNOSIS — E1159 Type 2 diabetes mellitus with other circulatory complications: Secondary | ICD-10-CM | POA: Diagnosis not present

## 2022-11-16 DIAGNOSIS — K5909 Other constipation: Secondary | ICD-10-CM | POA: Insufficient documentation

## 2022-11-16 DIAGNOSIS — E669 Obesity, unspecified: Secondary | ICD-10-CM

## 2022-11-16 MED ORDER — POLYETHYLENE GLYCOL 3350 17 GM/SCOOP PO POWD: 17.0000 g | Freq: Two times a day (BID) | ORAL | 0 refills | Status: DC | PRN

## 2022-11-16 MED ORDER — SEMAGLUTIDE (2 MG/DOSE) 8 MG/3ML ~~LOC~~ SOPN: 2.0000 mg | PEN_INJECTOR | SUBCUTANEOUS | 0 refills | Status: DC

## 2022-11-16 MED ORDER — VITAMIN D (ERGOCALCIFEROL) 1.25 MG (50000 UNIT) PO CAPS: 50000.0000 [IU] | ORAL_CAPSULE | ORAL | 0 refills | Status: DC

## 2022-11-17 DIAGNOSIS — I10 Essential (primary) hypertension: Secondary | ICD-10-CM | POA: Diagnosis not present

## 2022-11-17 DIAGNOSIS — E119 Type 2 diabetes mellitus without complications: Secondary | ICD-10-CM | POA: Diagnosis not present

## 2022-11-21 NOTE — Progress Notes (Unsigned)
Chief Complaint:   OBESITY [name] [name] is here to discuss his progress with his obesity treatment plan along with follow-up of his obesity related diagnoses. [name] [name] is on the Category 3 Plan and states he is following his eating plan approximately 90% of the time. [name] [name] states he is walking 3-4 times per week.    Today's visit was #: 96 Starting weight: 340 lbs Starting date: 12/28/2016 Today's weight: 311 lbs Today's date: 11/16/2022 Total lbs lost to date: 29 Total lbs lost since last in-office visit: 2  Interim History: [name] [name] continues to work on his diet and weight loss. His hunger is better controlled. He is working on increasing his protein and being more active.   Subjective:   1. Other constipation Calan notes increased constipation with his GLP-1. He notes abdominal cramping.   2. Type 2 diabetes mellitus with other circulatory complication, without long-term current use of insulin [name] [name]'s glucose readings and ranges between 140-170 pre and post prandial. This is improved with Ozempic.   3. Vitamin D deficiency [name] [name] is on Vitamin D, and his last level was at goal.   Assessment/Plan:   1. Other constipation [name] [name] agreed to start MiraLAX 17 grams per day with no refills, and he is to increase his water intake.   - polyethylene glycol powder (GLYCOLAX/MIRALAX) 17 GM/SCOOP powder; Take 17 g by mouth 2 (two) times daily as needed.  Dispense: 3350 g; Refill: 0  2. Type 2 diabetes mellitus with other circulatory complication, without long-term current use of insulin We will refill Ozempic 2 mg for 1 month. [name] [name] is to take 1 mg once weekly). We will recheck labs in 1-2 months.   - Semaglutide, 2 MG/DOSE, 8 MG/3ML SOPN; Inject 2 mg as directed once a week.  Dispense: 3 mL; Refill: 0  3. Vitamin D deficiency We will refill prescription Vitamin D for 1 month, and we will recheck labs in 1-2 months.   - Vitamin D, Ergocalciferol, (DRISDOL) 1.25 MG (50000 UNIT) CAPS  capsule; Take 1 capsule (50,000 Units total) by mouth every 14 (fourteen) days.  Dispense: 2 capsule; Refill: 0  4. BMI 40.0-44.9, adult  5. Obesity, Beginning BMI 44.86 Dub is currently in the action stage of change. As such, his goal is to continue with weight loss efforts. He has agreed to the Category 3 Plan.   Exercise goals: As is.   Behavioral modification strategies: increasing lean protein intake and increasing water intake.  [name] [name] has agreed to follow-up with our clinic in 4 weeks. He was informed of the importance of frequent follow-up visits to maximize his success with intensive lifestyle modifications for his multiple health conditions.   Objective:   Blood pressure (!) 177/70, pulse 77, temperature 97.9 F (36.6 C), height  (1.854 m), weight (!) 311 lb (141.1 kg), SpO2 95 %. Body mass index is 41.03 kg/m.  Lab Results  Component Value Date   CREATININE 1.12 09/21/2022   BUN 18 09/21/2022   NA 138 09/21/2022   K 4.4 09/21/2022   CL 100 09/21/2022   CO2 24 09/21/2022   Lab Results  Component Value Date   ALT 44 09/21/2022   AST 23 09/21/2022   ALKPHOS 62 09/21/2022   BILITOT 0.6 09/21/2022   Lab Results  Component Value Date   HGBA1C 7.4 (H) 09/21/2022   HGBA1C 10.1 (H) 03/07/2022   HGBA1C 10.9 (H) 02/07/2022   HGBA1C 6.4 09/14/2021   HGBA1C 6.3 (H) 02/22/2021   Lab Results  Component Value Date   INSULIN 22.7 03/07/2022   INSULIN 12.8 02/07/2022   INSULIN 11.7 02/22/2021   INSULIN 9.1 09/16/2020   INSULIN 10.0 08/14/2019   Lab Results  Component Value Date   TSH 1.260 03/07/2022   Lab Results  Component Value Date   CHOL 113 04/18/2022   HDL 38 (L) 04/18/2022   LDLCALC 58 04/18/2022   LDLDIRECT 148 (H) 11/15/2007   TRIG 83 04/18/2022   CHOLHDL 3.0 04/18/2022   Lab Results  Component Value Date   VD25OH 65.0 09/21/2022   VD25OH 60.9 03/07/2022   VD25OH 60.3 02/07/2022   Lab Results  Component Value Date   WBC 13.1 (H)  08/18/2022   HGB 16.2 08/18/2022   HCT 49.1 08/18/2022   MCV 85.2 08/18/2022   PLT 333 08/18/2022   No results found for: "IRON", "TIBC", "FERRITIN"  Attestation Statements:   Reviewed by clinician on day of visit: allergies, medications, problem list, medical history, surgical history, family history, social history, and previous encounter notes.   I, Burt Knack, am acting as transcriptionist for Quillian Quince, MD.  I have reviewed the above documentation for accuracy and completeness, and I agree with the above. -  Quillian Quince, MD
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
## 2022-11-25 ENCOUNTER — Encounter: Payer: Self-pay | Admitting: Vascular Surgery

## 2022-11-25 ENCOUNTER — Ambulatory Visit: Payer: Medicare HMO | Admitting: Vascular Surgery

## 2022-11-25 ENCOUNTER — Ambulatory Visit (HOSPITAL_COMMUNITY)
Admission: RE | Admit: 2022-11-25 | Discharge: 2022-11-25 | Disposition: A | Discharge status: Home / Self Care | Payer: Medicare HMO | Source: Ambulatory Visit | Attending: Vascular Surgery

## 2022-11-25 VITALS — BP 118/67 | HR 63 | Temp 98.1°F | Resp 16 | Ht 73.0 in | Wt 315.0 lb

## 2022-11-25 DIAGNOSIS — I6523 Occlusion and stenosis of bilateral carotid arteries: Secondary | ICD-10-CM | POA: Diagnosis not present

## 2022-11-25 DIAGNOSIS — I6522 Occlusion and stenosis of left carotid artery: Secondary | ICD-10-CM | POA: Insufficient documentation

## 2022-11-29 DIAGNOSIS — E119 Type 2 diabetes mellitus without complications: Secondary | ICD-10-CM | POA: Diagnosis not present

## 2022-11-29 DIAGNOSIS — I10 Essential (primary) hypertension: Secondary | ICD-10-CM | POA: Diagnosis not present

## 2022-12-05 ENCOUNTER — Other Ambulatory Visit: Payer: Self-pay

## 2022-12-05 DIAGNOSIS — I6523 Occlusion and stenosis of bilateral carotid arteries: Secondary | ICD-10-CM

## 2022-12-08 ENCOUNTER — Other Ambulatory Visit: Payer: Self-pay | Admitting: Student

## 2022-12-08 DIAGNOSIS — E1159 Type 2 diabetes mellitus with other circulatory complications: Secondary | ICD-10-CM

## 2022-12-14 ENCOUNTER — Ambulatory Visit (INDEPENDENT_AMBULATORY_CARE_PROVIDER_SITE_OTHER): Payer: Medicare HMO | Admitting: Family Medicine

## 2022-12-14 ENCOUNTER — Encounter (INDEPENDENT_AMBULATORY_CARE_PROVIDER_SITE_OTHER): Payer: Self-pay | Admitting: Family Medicine

## 2022-12-14 VITALS — BP 147/83 | HR 69 | Temp 98.0°F | Ht 73.0 in | Wt 308.0 lb

## 2022-12-14 DIAGNOSIS — E669 Obesity, unspecified: Secondary | ICD-10-CM | POA: Diagnosis not present

## 2022-12-14 DIAGNOSIS — Z7985 Long-term (current) use of injectable non-insulin antidiabetic drugs: Secondary | ICD-10-CM | POA: Diagnosis not present

## 2022-12-14 DIAGNOSIS — I10 Essential (primary) hypertension: Secondary | ICD-10-CM | POA: Diagnosis not present

## 2022-12-14 DIAGNOSIS — Z6841 Body Mass Index (BMI) 40.0 and over, adult: Secondary | ICD-10-CM

## 2022-12-14 DIAGNOSIS — E1159 Type 2 diabetes mellitus with other circulatory complications: Secondary | ICD-10-CM

## 2022-12-17 DIAGNOSIS — I10 Essential (primary) hypertension: Secondary | ICD-10-CM | POA: Diagnosis not present

## 2022-12-17 DIAGNOSIS — E119 Type 2 diabetes mellitus without complications: Secondary | ICD-10-CM | POA: Diagnosis not present

## 2022-12-19 NOTE — Progress Notes (Unsigned)
Chief Complaint:   OBESITY [name] [name] [name] here to discuss his progress with his obesity treatment plan along with [name] of his obesity related diagnoses. [name] [name] [name] on the Category 3 Plan and states he [name] following his eating plan approximately 90% of the time. [name] [name] states he [name] walking and doing yard work for 10-15 minutes 7 times per week.  Today's visit was #: 97 Starting weight: 340 lbs Starting date: 12/28/2016 Today's weight: 308 lbs Today's date: 12/14/2022 Total lbs lost to date: 32 Total lbs lost since last in-office visit: 3  Interim History: [name] [name] has done well with his weight loss this last month.  He [name] more active doing yard work and gardening, and he has increased his walking.  His hunger [name] mostly controlled and although he eats out when he travels he [name] working on portion control.  Subjective:   1. Primary hypertension Myer's blood pressure [name] elevated today.  He checked it at home and it usually runs at goal.  His elevation in blood pressure today may be due to whitecoat hypertension.  2. Type 2 diabetes mellitus with other circulatory complication, without long-term current use of insulin (HCC) [name] [name] fasting blood sugars range between 119-200, mostly 140-150's.  He [name] working on decreasing simple carbohydrates in his diet.  Assessment/Plan:   1. Primary hypertension [name] [name] will continue losartan and keep checking his blood pressure at home.  We will continue to monitor.  2. Type 2 diabetes mellitus with other circulatory complication, without long-term current use of insulin (HCC) [name] [name] will continue Ozempic at 1 mg once weekly, and continue metformin and glimepiride.  He was educated on glucose goals and how to avoid renal damage and heart disease.  3. Obesity, Beginning BMI 44.86  4. Obesity, current BMI of 40.7 [name] [name] currently in the action stage of change. As such, his goal [name] to continue with weight loss efforts. He has agreed to the Category  3 Plan.   Exercise goals: As [name].   Behavioral modification strategies: Eating Out strategies were discussed.  [name] [name] with our clinic in 4 weeks. He was informed of the importance of frequent [name] visits to maximize his success with intensive lifestyle modifications for his multiple health conditions.   Objective:   Blood pressure (!) 147/83, pulse 69, temperature 98 F (36.7 C), height 6\' 1"  (1.854 m), weight (!) 308 lb (139.7 kg), SpO2 98 %. Body mass index [name] 40.64 kg/m.  Lab Results  Component Value Date   CREATININE 1.12 09/21/2022   BUN 18 09/21/2022   NA 138 09/21/2022   K 4.4 09/21/2022   CL 100 09/21/2022   CO2 24 09/21/2022   Lab Results  Component Value Date   ALT 44 09/21/2022   AST 23 09/21/2022   ALKPHOS 62 09/21/2022   BILITOT 0.6 09/21/2022   Lab Results  Component Value Date   HGBA1C 7.4 (H) 09/21/2022   HGBA1C 10.1 (H) 03/07/2022   HGBA1C 10.9 (H) 02/07/2022   HGBA1C 6.4 09/14/2021   HGBA1C 6.3 (H) 02/22/2021   Lab Results  Component Value Date   INSULIN 22.7 03/07/2022   INSULIN 12.8 02/07/2022   INSULIN 11.7 02/22/2021   INSULIN 9.1 09/16/2020   INSULIN 10.0 08/14/2019   Lab Results  Component Value Date   TSH 1.260 03/07/2022   Lab Results  Component Value Date   CHOL 113 04/18/2022   HDL 38 (L) 04/18/2022   LDLCALC 58 04/18/2022   LDLDIRECT 148 (  H) 11/15/2007   TRIG 83 04/18/2022   CHOLHDL 3.0 04/18/2022   Lab Results  Component Value Date   VD25OH 65.0 09/21/2022   VD25OH 60.9 03/07/2022   VD25OH 60.3 02/07/2022   Lab Results  Component Value Date   WBC 13.1 (H) 08/18/2022   HGB 16.2 08/18/2022   HCT 49.1 08/18/2022   MCV 85.2 08/18/2022   PLT 333 08/18/2022   No results found for: "IRON", "TIBC", "FERRITIN"  Attestation Statements:   Reviewed by clinician on day of visit: allergies, medications, problem list, medical history, surgical history, family history, social history, and previous  encounter notes.   I, Burt Knack, am acting as transcriptionist for Quillian Quince, MD.  I have reviewed the above documentation for accuracy and completeness, and I agree with the above. -  Quillian Quince, MD

## 2023-01-06 ENCOUNTER — Other Ambulatory Visit: Payer: Self-pay | Admitting: Student

## 2023-01-06 DIAGNOSIS — E1159 Type 2 diabetes mellitus with other circulatory complications: Secondary | ICD-10-CM

## 2023-01-06 DIAGNOSIS — E1169 Type 2 diabetes mellitus with other specified complication: Secondary | ICD-10-CM

## 2023-01-09 ENCOUNTER — Other Ambulatory Visit: Payer: Self-pay | Admitting: Physician Assistant

## 2023-01-09 ENCOUNTER — Other Ambulatory Visit (INDEPENDENT_AMBULATORY_CARE_PROVIDER_SITE_OTHER): Payer: Self-pay | Admitting: Family Medicine

## 2023-01-09 ENCOUNTER — Other Ambulatory Visit: Payer: Self-pay | Admitting: Student

## 2023-01-09 DIAGNOSIS — N3941 Urge incontinence: Secondary | ICD-10-CM

## 2023-01-09 DIAGNOSIS — E559 Vitamin D deficiency, unspecified: Secondary | ICD-10-CM

## 2023-01-11 ENCOUNTER — Encounter: Payer: Self-pay | Admitting: Interventional Cardiology

## 2023-01-11 ENCOUNTER — Encounter (INDEPENDENT_AMBULATORY_CARE_PROVIDER_SITE_OTHER): Payer: Self-pay | Admitting: Family Medicine

## 2023-01-11 ENCOUNTER — Ambulatory Visit (INDEPENDENT_AMBULATORY_CARE_PROVIDER_SITE_OTHER): Payer: Medicare HMO | Admitting: Family Medicine

## 2023-01-11 ENCOUNTER — Ambulatory Visit (HOSPITAL_COMMUNITY): Payer: Medicare HMO | Attending: Physician Assistant

## 2023-01-11 VITALS — BP 134/76 | HR 75 | Temp 98.0°F | Ht 73.0 in | Wt 311.0 lb

## 2023-01-11 DIAGNOSIS — E119 Type 2 diabetes mellitus without complications: Secondary | ICD-10-CM | POA: Diagnosis not present

## 2023-01-11 DIAGNOSIS — Z7985 Long-term (current) use of injectable non-insulin antidiabetic drugs: Secondary | ICD-10-CM

## 2023-01-11 DIAGNOSIS — E1159 Type 2 diabetes mellitus with other circulatory complications: Secondary | ICD-10-CM | POA: Diagnosis not present

## 2023-01-11 DIAGNOSIS — I77810 Thoracic aortic ectasia: Secondary | ICD-10-CM | POA: Diagnosis not present

## 2023-01-11 DIAGNOSIS — E559 Vitamin D deficiency, unspecified: Secondary | ICD-10-CM

## 2023-01-11 DIAGNOSIS — E785 Hyperlipidemia, unspecified: Secondary | ICD-10-CM | POA: Diagnosis not present

## 2023-01-11 DIAGNOSIS — Z7902 Long term (current) use of antithrombotics/antiplatelets: Secondary | ICD-10-CM

## 2023-01-11 DIAGNOSIS — I110 Hypertensive heart disease with heart failure: Secondary | ICD-10-CM | POA: Diagnosis not present

## 2023-01-11 DIAGNOSIS — I509 Heart failure, unspecified: Secondary | ICD-10-CM | POA: Insufficient documentation

## 2023-01-11 DIAGNOSIS — Z6841 Body Mass Index (BMI) 40.0 and over, adult: Secondary | ICD-10-CM

## 2023-01-11 DIAGNOSIS — E669 Obesity, unspecified: Secondary | ICD-10-CM | POA: Diagnosis not present

## 2023-01-11 DIAGNOSIS — R58 Hemorrhage, not elsewhere classified: Secondary | ICD-10-CM

## 2023-01-11 DIAGNOSIS — G4733 Obstructive sleep apnea (adult) (pediatric): Secondary | ICD-10-CM | POA: Insufficient documentation

## 2023-01-11 LAB — ECHOCARDIOGRAM COMPLETE
Area-P 1/2: 3.22 cm2
Height: 73 in
S' Lateral: 3.3 cm
Weight: 4976 oz

## 2023-01-11 MED ORDER — SEMAGLUTIDE (2 MG/DOSE) 8 MG/3ML ~~LOC~~ SOPN: 2.0000 mg | PEN_INJECTOR | SUBCUTANEOUS | 0 refills | Status: DC

## 2023-01-11 MED ORDER — VITAMIN D (ERGOCALCIFEROL) 1.25 MG (50000 UNIT) PO CAPS: 50000.0000 [IU] | ORAL_CAPSULE | ORAL | 0 refills | Status: DC

## 2023-01-11 NOTE — Progress Notes (Signed)
Chief Complaint:   OBESITY [name] [name] is here to discuss his progress with his obesity treatment plan along with follow-up of his obesity related diagnoses. [name] [name] is on the Category 3 Plan and states he is following his eating plan approximately 95% of the time. [name] [name] states he is doing yard work for 3-4 hours 5 times per week.    Today's visit was #: 98 Starting weight: 340 lbs Starting date: 12/28/2016 Today's weight: 311 lbs Today's date: 01/11/2023 Total lbs lost to date: 29 Total lbs lost since last in-office visit: 0  Interim History: Patient has increased his water intake while working outdoors recently.  His 3 pound weight gain is due to water retention per our bioimpedance scale.  He is doing well with his diet and exercise overall.  Subjective:   1. Vitamin D deficiency Patient is stable on vitamin D, and he denies nausea, vomiting, or muscle weakness.  2. Type 2 diabetes mellitus with other circulatory complication, without long-term current use of insulin (HCC) Patient is doing well on Ozempic, and he denies nausea or vomiting.  He is doing well with his diet and decreasing simple carbohydrates.  3. Ecchymosis Patient has been working on his sister's house and property, and he has increased bruising on his arms, legs, and chest, some of which he does not remember doing anything to cause this.  He is on Plavix, and he denies extensive bleeding.  Assessment/Plan:   1. Vitamin D deficiency Patient will continue prescription vitamin D every 14 days, and we will refill for 1 month.  - Vitamin D, Ergocalciferol, (DRISDOL) 1.25 MG (50000 UNIT) CAPS capsule; Take 1 capsule (50,000 Units total) by mouth every 14 (fourteen) days.  Dispense: 2 capsule; Refill: 0  2. Type 2 diabetes mellitus with other circulatory complication, without long-term current use of insulin (HCC) Patient will continue Ozempic 2 mg once weekly, and we will refill for 1 month.  - Semaglutide, 2  MG/DOSE, 8 MG/3ML SOPN; Inject 2 mg as directed once a week.  Dispense: 3 mL; Refill: 0  3. Ecchymosis Patient agreed to start vitamin C OTC and continue Plavix, and we will follow-up with cardiology today to see if anything else needs to be adjusted.  4. BMI 40.0-44.9, adult (HCC)  5. Obesity, Beginning BMI 44.86 [name] [name] is currently in the action stage of change. As such, his goal is to continue with weight loss efforts. He has agreed to the Category 3 Plan.   Patient is to add electrolytes when he works outdoors and sweats heavy in addition to increased water.  Exercise goals: As is.   Behavioral modification strategies: increasing vegetables and increasing water intake.  [name] has agreed to follow-up with our clinic in 4 weeks. He was informed of the importance of frequent follow-up visits to maximize his success with intensive lifestyle modifications for his multiple health conditions.   Objective:   Blood pressure 134/76, pulse 75, temperature 98 F (36.7 C), height 6\' 1"  (1.854 m), weight (!) 311 lb (141.1 kg), SpO2 96 %. Body mass index is 41.03 kg/m.  Lab Results  Component Value Date   CREATININE 1.12 09/21/2022   BUN 18 09/21/2022   NA 138 09/21/2022   K 4.4 09/21/2022   CL 100 09/21/2022   CO2 24 09/21/2022   Lab Results  Component Value Date   ALT 44 09/21/2022   AST 23 09/21/2022   ALKPHOS 62 09/21/2022   BILITOT 0.6 09/21/2022   Lab Results  Component  Value Date   HGBA1C 7.4 (H) 09/21/2022   HGBA1C 10.1 (H) 03/07/2022   HGBA1C 10.9 (H) 02/07/2022   HGBA1C 6.4 09/14/2021   HGBA1C 6.3 (H) 02/22/2021   Lab Results  Component Value Date   INSULIN 22.7 03/07/2022   INSULIN 12.8 02/07/2022   INSULIN 11.7 02/22/2021   INSULIN 9.1 09/16/2020   INSULIN 10.0 08/14/2019   Lab Results  Component Value Date   TSH 1.260 03/07/2022   Lab Results  Component Value Date   CHOL 113 04/18/2022   HDL 38 (L) 04/18/2022   LDLCALC 58 04/18/2022   LDLDIRECT 148  (H) 11/15/2007   TRIG 83 04/18/2022   CHOLHDL 3.0 04/18/2022   Lab Results  Component Value Date   VD25OH 65.0 09/21/2022   VD25OH 60.9 03/07/2022   VD25OH 60.3 02/07/2022   Lab Results  Component Value Date   WBC 13.1 (H) 08/18/2022   HGB 16.2 08/18/2022   HCT 49.1 08/18/2022   MCV 85.2 08/18/2022   PLT 333 08/18/2022   No results found for: "IRON", "TIBC", "FERRITIN"  Attestation Statements:   Reviewed by clinician on day of visit: allergies, medications, problem list, medical history, surgical history, family history, social history, and previous encounter notes.  I, Burt Knack, am acting as transcriptionist for Quillian Quince, MD.  I have reviewed the above documentation for accuracy and completeness, and I agree with the above. -  Quillian Quince, MD

## 2023-01-11 NOTE — Telephone Encounter (Signed)
See other my chart message from today for documentation

## 2023-01-11 NOTE — Telephone Encounter (Signed)
I spoke with patient.  He reports he was asked at echo appointment today how long his heart had been out of rhythm.  Patient reports he was not aware of any irregular rhythm. He has been feeling very tired for last 6-8 weeks.  Some shortness of breath at times.  Appointment made for patient to see Dr Eldridge Dace on June 17 at 8:00.  ED precautions reviewed with patient. Echo reviewed with Dr Elease Hashimoto and patient is in SR.

## 2023-01-12 ENCOUNTER — Telehealth: Payer: Self-pay | Admitting: *Deleted

## 2023-01-12 DIAGNOSIS — I77810 Thoracic aortic ectasia: Secondary | ICD-10-CM

## 2023-01-12 NOTE — Telephone Encounter (Signed)
-----   Message from Beatrice Lecher, [place] sent at 01/12/2023  6:52 AM EDT ----- Results sent to [name] via MyChart. See MyChart comments below. I will send a copy to PCP as FYI. PLAN:  -Repeat echocardiogram in 1 year (Dx: ascending aorta dilation)  [name] [name]  Your your echocardiogram shows normal ejection fraction (heart function). There is trivial leakage of the aortic and mitral valves. This is not significant and is not causing a problem. Your aorta size is stable at 40 mm. We will repeat this again in 1 year to continue to monitor the size. Continue current medications/treatment plan and follow up as scheduled.  [name], PA-C

## 2023-01-13 ENCOUNTER — Ambulatory Visit
Admission: RE | Admit: 2023-01-13 | Discharge: 2023-01-13 | Disposition: A | Discharge status: Home / Self Care | Payer: Medicare HMO | Source: Ambulatory Visit | Attending: Urgent Care | Admitting: Urgent Care

## 2023-01-13 VITALS — BP 134/75 | HR 76 | Temp 97.8°F | Resp 18

## 2023-01-13 DIAGNOSIS — H6123 Impacted cerumen, bilateral: Secondary | ICD-10-CM | POA: Diagnosis not present

## 2023-01-13 HISTORY — DX: Unspecified atrial fibrillation: I48.91

## 2023-01-13 MED ORDER — CARBAMIDE PEROXIDE 6.5 % OT SOLN: 5.0000 [drp] | Freq: Every day | OTIC | 0 refills | Status: DC | PRN
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
## 2023-01-13 NOTE — ED Triage Notes (Signed)
Pt reports he tried to clean his ears out and thinks he pushed the wax further down in his ear x 3 days.
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
## 2023-01-16 ENCOUNTER — Ambulatory Visit: Payer: Medicare HMO | Attending: Interventional Cardiology | Admitting: Interventional Cardiology

## 2023-01-16 ENCOUNTER — Encounter: Payer: Self-pay | Admitting: Interventional Cardiology

## 2023-01-16 VITALS — BP 152/88 | HR 79 | Ht 73.0 in | Wt 318.0 lb

## 2023-01-16 DIAGNOSIS — I77810 Thoracic aortic ectasia: Secondary | ICD-10-CM | POA: Diagnosis not present

## 2023-01-16 DIAGNOSIS — Z7984 Long term (current) use of oral hypoglycemic drugs: Secondary | ICD-10-CM

## 2023-01-16 DIAGNOSIS — I10 Essential (primary) hypertension: Secondary | ICD-10-CM | POA: Diagnosis not present

## 2023-01-16 DIAGNOSIS — E785 Hyperlipidemia, unspecified: Secondary | ICD-10-CM

## 2023-01-16 DIAGNOSIS — I6523 Occlusion and stenosis of bilateral carotid arteries: Secondary | ICD-10-CM | POA: Diagnosis not present

## 2023-01-16 DIAGNOSIS — E1159 Type 2 diabetes mellitus with other circulatory complications: Secondary | ICD-10-CM | POA: Diagnosis not present

## 2023-01-16 DIAGNOSIS — I25119 Atherosclerotic heart disease of native coronary artery with unspecified angina pectoris: Secondary | ICD-10-CM

## 2023-01-16 DIAGNOSIS — I493 Ventricular premature depolarization: Secondary | ICD-10-CM | POA: Diagnosis not present

## 2023-01-16 DIAGNOSIS — E119 Type 2 diabetes mellitus without complications: Secondary | ICD-10-CM | POA: Diagnosis not present

## 2023-01-16 MED ORDER — LOSARTAN POTASSIUM 50 MG PO TABS: 50.0000 mg | ORAL_TABLET | Freq: Every day | ORAL | 3 refills | Status: DC

## 2023-01-16 NOTE — Patient Instructions (Signed)
Medication Instructions:  Your physician has recommended you make the following change in your medication: Increase Losartan to 50 mg by mouth daily   *If you need a refill on your cardiac medications before your next appointment, please call your pharmacy*   Lab Work: Your physician recommends that you return for lab work on June 26--BMET and TSH  If you have labs (blood work) drawn today and your tests are completely normal, you will receive your results only by: [employer] (if you have MyChart) OR A paper copy in the mail If you have any lab test that is abnormal or we need to change your treatment, we will call you to review the results.   Testing/Procedures: none   Follow-Up: At [facility], you and your health needs are our priority.  As part of our continuing mission to provide you with exceptional heart care, we have created designated Provider Care Teams.  These Care Teams include your primary Cardiologist (physician) and Advanced Practice Providers (APPs -  Physician Assistants and Nurse Practitioners) who all work together to provide you with the care you need, when you need it.  We recommend signing up for the patient portal called "MyChart".  Sign up information is provided on this After Visit Summary.  MyChart is used to connect with patients for Virtual Visits (Telemedicine).  Patients are able to view lab/test results, encounter notes, upcoming appointments, etc.  Non-urgent messages can be sent to your provider as well.   To learn more about what you can do with MyChart, go to [email or web address].    Your next appointment:   Feb 2025  Provider:   Lance Muss, MD     Other Instructions

## 2023-01-24 ENCOUNTER — Ambulatory Visit (INDEPENDENT_AMBULATORY_CARE_PROVIDER_SITE_OTHER): Payer: Medicare HMO | Admitting: *Deleted

## 2023-01-24 DIAGNOSIS — Z0000 Encounter for general adult medical examination without abnormal findings: Secondary | ICD-10-CM | POA: Diagnosis not present
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
## 2023-01-25 ENCOUNTER — Ambulatory Visit: Payer: Medicare HMO

## 2023-01-25 ENCOUNTER — Encounter: Payer: Self-pay | Admitting: *Deleted

## 2023-01-26 ENCOUNTER — Ambulatory Visit: Payer: Medicare HMO | Attending: Interventional Cardiology

## 2023-01-26 DIAGNOSIS — I493 Ventricular premature depolarization: Secondary | ICD-10-CM

## 2023-01-26 DIAGNOSIS — I10 Essential (primary) hypertension: Secondary | ICD-10-CM | POA: Diagnosis not present

## 2023-01-26 DIAGNOSIS — I25119 Atherosclerotic heart disease of native coronary artery with unspecified angina pectoris: Secondary | ICD-10-CM

## 2023-01-27 LAB — BASIC METABOLIC PANEL WITH GFR
BUN/Creatinine Ratio: 16 (ref 10–24)
BUN: 18 mg/dL (ref 8–27)
CO2: 24 mmol/L (ref 20–29)
Calcium: 9.6 mg/dL (ref 8.6–10.2)
Chloride: 99 mmol/L (ref 96–106)
Creatinine, Ser: 1.14 mg/dL (ref 0.76–1.27)
Glucose: 151 mg/dL — ABNORMAL HIGH (ref 70–99)
Potassium: 4.3 mmol/L (ref 3.5–5.2)
Sodium: 138 mmol/L (ref 134–144)
eGFR: 67 mL/min/1.73

## 2023-01-27 LAB — TSH: TSH: 1.05 u[IU]/mL (ref 0.450–4.500)

## 2023-01-29 DIAGNOSIS — I10 Essential (primary) hypertension: Secondary | ICD-10-CM | POA: Diagnosis not present

## 2023-01-29 DIAGNOSIS — E119 Type 2 diabetes mellitus without complications: Secondary | ICD-10-CM | POA: Diagnosis not present

## 2023-02-07 ENCOUNTER — Other Ambulatory Visit (INDEPENDENT_AMBULATORY_CARE_PROVIDER_SITE_OTHER): Payer: Self-pay | Admitting: Family Medicine

## 2023-02-07 DIAGNOSIS — E559 Vitamin D deficiency, unspecified: Secondary | ICD-10-CM

## 2023-02-08 ENCOUNTER — Ambulatory Visit (INDEPENDENT_AMBULATORY_CARE_PROVIDER_SITE_OTHER): Payer: Medicare HMO | Admitting: Family Medicine

## 2023-02-08 ENCOUNTER — Encounter (INDEPENDENT_AMBULATORY_CARE_PROVIDER_SITE_OTHER): Payer: Self-pay | Admitting: Family Medicine

## 2023-02-08 VITALS — BP 103/57 | HR 78 | Temp 98.0°F | Ht 73.0 in | Wt 308.0 lb

## 2023-02-08 DIAGNOSIS — E669 Obesity, unspecified: Secondary | ICD-10-CM

## 2023-02-08 DIAGNOSIS — Z7985 Long-term (current) use of injectable non-insulin antidiabetic drugs: Secondary | ICD-10-CM

## 2023-02-08 DIAGNOSIS — E1159 Type 2 diabetes mellitus with other circulatory complications: Secondary | ICD-10-CM

## 2023-02-08 DIAGNOSIS — E559 Vitamin D deficiency, unspecified: Secondary | ICD-10-CM

## 2023-02-08 DIAGNOSIS — Z6841 Body Mass Index (BMI) 40.0 and over, adult: Secondary | ICD-10-CM | POA: Diagnosis not present

## 2023-02-08 DIAGNOSIS — F3289 Other specified depressive episodes: Secondary | ICD-10-CM | POA: Diagnosis not present

## 2023-02-08 NOTE — Progress Notes (Unsigned)
Chief Complaint:   OBESITY [name] [name] is here to discuss his progress with his obesity treatment plan along with follow-up of his obesity related diagnoses. [name] [name] is on the Category 3 Plan and states he is following his eating plan approximately 85% of the time. [name] [name] states he is walking and doing yard work for 15-20 minutes 3-4 times per week.  Today's visit was #: 99 Starting weight: 340 lbs Starting date: 12/28/2016 Today's weight: 308 lbs Today's date: 02/08/2023 Total lbs lost to date: 32 Total lbs lost since last in-office visit: 3  Interim History: Patient continues to do well with his weight loss.  He is following his category 3 plan well and he has been more active recently.  His hunger is controlled overall.  Subjective:   1. Vitamin D deficiency Patient is not on vitamin D, and he is due to have his labs rechecked as he is at high risk of over replacement.  2. Type 2 diabetes mellitus with other circulatory complication, without long-term current use of insulin (HCC) Patient's glucose averages at 165, and his last A1c was 7.4 which is improving but not yet at goal.  He is due to have labs rechecked.  3. Emotinal Eating Behavior Patient notes feeling a bit more scattered and less organized over the last 2 months.  He is newly retired and he is not in a normal routine which may contribute to the problem.  Assessment/Plan:   1. Vitamin D deficiency We will check labs today, and we will follow-up at patient's next visit.  - VITAMIN D 25 Hydroxy (Vit-D Deficiency, Fractures)  2. Type 2 diabetes mellitus with other circulatory complication, without long-term current use of insulin (HCC) We will check labs today, and we will follow-up at his next visit.  Patient will continue Ozempic as is.  - Hemoglobin A1c  3. Emotinal Eating Behavior Patient agreed to discontinue Wellbutrin SR, and start Wellbutrin XL 300 mg every morning #30 with no refills.  He will work on making  a new daily routine  4. BMI 40.0-44.9, adult (HCC)  5. Obesity, Beginning BMI 44.86 [name] [name] is currently in the action stage of change. As such, his goal is to continue with weight loss efforts. He has agreed to the Category 3 Plan.   Exercise goals: For substantial health benefits, adults should do at least 150 minutes (2 hours and 30 minutes) a week of moderate-intensity, or 75 minutes (1 hour and 15 minutes) a week of vigorous-intensity aerobic physical activity, or an equivalent combination of moderate- and vigorous-intensity aerobic activity. Aerobic activity should be performed in episodes of at least 10 minutes, and preferably, it should be spread throughout the week.  Behavioral modification strategies: increasing water intake.  [name] [name] has agreed to follow-up with our clinic in 4 weeks. He was informed of the importance of frequent follow-up visits to maximize his success with intensive lifestyle modifications for his multiple health conditions.   [name] [name] was informed we would discuss his lab results at his next visit unless there is a critical issue that needs to be addressed sooner. [name] [name] agreed to keep his next visit at the agreed upon time to discuss these results.  Objective:   Blood pressure (!) 103/57, pulse 78, temperature 98 F (36.7 C), height 6\' 1"  (1.854 m), weight (!) 308 lb (139.7 kg), SpO2 94 %. Body mass index is 40.64 kg/m.  Lab Results  Component Value Date   CREATININE 1.14 01/26/2023   BUN 18 01/26/2023  NA 138 01/26/2023   K 4.3 01/26/2023   CL 99 01/26/2023   CO2 24 01/26/2023   Lab Results  Component Value Date   ALT 44 09/21/2022   AST 23 09/21/2022   ALKPHOS 62 09/21/2022   BILITOT 0.6 09/21/2022   Lab Results  Component Value Date   HGBA1C 7.4 (H) 09/21/2022   HGBA1C 10.1 (H) 03/07/2022   HGBA1C 10.9 (H) 02/07/2022   HGBA1C 6.4 09/14/2021   HGBA1C 6.3 (H) 02/22/2021   Lab Results  Component Value Date   INSULIN 22.7 03/07/2022    INSULIN 12.8 02/07/2022   INSULIN 11.7 02/22/2021   INSULIN 9.1 09/16/2020   INSULIN 10.0 08/14/2019   Lab Results  Component Value Date   TSH 1.050 01/26/2023   Lab Results  Component Value Date   CHOL 113 04/18/2022   HDL 38 (L) 04/18/2022   LDLCALC 58 04/18/2022   LDLDIRECT 148 (H) 11/15/2007   TRIG 83 04/18/2022   CHOLHDL 3.0 04/18/2022   Lab Results  Component Value Date   VD25OH 65.0 09/21/2022   VD25OH 60.9 03/07/2022   VD25OH 60.3 02/07/2022   Lab Results  Component Value Date   WBC 13.1 (H) 08/18/2022   HGB 16.2 08/18/2022   HCT 49.1 08/18/2022   MCV 85.2 08/18/2022   PLT 333 08/18/2022   No results found for: "IRON", "TIBC", "FERRITIN"  Attestation Statements:   Reviewed by clinician on day of visit: allergies, medications, problem list, medical history, surgical history, family history, social history, and previous encounter notes.  Time spent on visit including pre-visit chart review and post-visit care and charting was 40 minutes.   I, Burt Knack, am acting as transcriptionist for Quillian Quince, MD.  I have reviewed the above documentation for accuracy and completeness, and I agree with the above. -  ***

## 2023-02-09 LAB — HEMOGLOBIN A1C
Est. average glucose Bld gHb Est-mCnc: 154 mg/dL
Hgb A1c MFr Bld: 7 % — ABNORMAL HIGH (ref 4.8–5.6)

## 2023-02-09 LAB — VITAMIN D 25 HYDROXY (VIT D DEFICIENCY, FRACTURES): Vit D, 25-Hydroxy: 63.9 ng/mL (ref 30.0–100.0)

## 2023-02-09 MED ORDER — BUPROPION HCL ER (XL) 300 MG PO TB24
300.0000 mg | ORAL_TABLET | Freq: Every day | ORAL | 0 refills | Status: DC
Start: 2023-02-09 — End: 2023-03-08

## 2023-02-15 DIAGNOSIS — I10 Essential (primary) hypertension: Secondary | ICD-10-CM | POA: Diagnosis not present

## 2023-02-15 DIAGNOSIS — E119 Type 2 diabetes mellitus without complications: Secondary | ICD-10-CM | POA: Diagnosis not present

## 2023-03-01 DIAGNOSIS — I10 Essential (primary) hypertension: Secondary | ICD-10-CM | POA: Diagnosis not present

## 2023-03-01 DIAGNOSIS — E119 Type 2 diabetes mellitus without complications: Secondary | ICD-10-CM | POA: Diagnosis not present

## 2023-03-08 ENCOUNTER — Encounter: Payer: Self-pay | Admitting: Interventional Cardiology

## 2023-03-08 ENCOUNTER — Ambulatory Visit (INDEPENDENT_AMBULATORY_CARE_PROVIDER_SITE_OTHER): Payer: Medicare HMO | Admitting: Family Medicine

## 2023-03-08 ENCOUNTER — Other Ambulatory Visit: Payer: Self-pay | Admitting: Student

## 2023-03-08 ENCOUNTER — Encounter (INDEPENDENT_AMBULATORY_CARE_PROVIDER_SITE_OTHER): Payer: Self-pay | Admitting: Family Medicine

## 2023-03-08 VITALS — BP 112/61 | HR 86 | Temp 97.6°F | Ht 73.0 in | Wt 308.0 lb

## 2023-03-08 DIAGNOSIS — E669 Obesity, unspecified: Secondary | ICD-10-CM | POA: Diagnosis not present

## 2023-03-08 DIAGNOSIS — R5383 Other fatigue: Secondary | ICD-10-CM

## 2023-03-08 DIAGNOSIS — R42 Dizziness and giddiness: Secondary | ICD-10-CM | POA: Diagnosis not present

## 2023-03-08 DIAGNOSIS — Z6841 Body Mass Index (BMI) 40.0 and over, adult: Secondary | ICD-10-CM | POA: Diagnosis not present

## 2023-03-08 DIAGNOSIS — E1159 Type 2 diabetes mellitus with other circulatory complications: Secondary | ICD-10-CM | POA: Diagnosis not present

## 2023-03-08 DIAGNOSIS — E119 Type 2 diabetes mellitus without complications: Secondary | ICD-10-CM

## 2023-03-08 DIAGNOSIS — F3289 Other specified depressive episodes: Secondary | ICD-10-CM | POA: Diagnosis not present

## 2023-03-08 DIAGNOSIS — E559 Vitamin D deficiency, unspecified: Secondary | ICD-10-CM | POA: Diagnosis not present

## 2023-03-08 DIAGNOSIS — E7849 Other hyperlipidemia: Secondary | ICD-10-CM

## 2023-03-08 DIAGNOSIS — I10 Essential (primary) hypertension: Secondary | ICD-10-CM

## 2023-03-08 DIAGNOSIS — Z7985 Long-term (current) use of injectable non-insulin antidiabetic drugs: Secondary | ICD-10-CM

## 2023-03-08 MED ORDER — BUPROPION HCL ER (XL) 300 MG PO TB24: 300.0000 mg | ORAL_TABLET | Freq: Every day | ORAL | 0 refills | Status: DC

## 2023-03-08 MED ORDER — VITAMIN D (ERGOCALCIFEROL) 1.25 MG (50000 UNIT) PO CAPS: 50000.0000 [IU] | ORAL_CAPSULE | ORAL | 0 refills | Status: DC

## 2023-03-08 NOTE — Telephone Encounter (Signed)
Error

## 2023-03-08 NOTE — Progress Notes (Signed)
.smr  Office: [phone]  /  Fax: [phone]  WEIGHT SUMMARY AND BIOMETRICS  Anthropometric Measurements Height: 6\' 1"  (1.854 m) Weight: (!) 308 lb (139.7 kg) BMI (Calculated): 40.64 Weight at Last Visit: 308 lb Weight Lost Since Last Visit: 0 Weight Gained Since Last Visit: 0 Starting Weight: 340 lb Total Weight Loss (lbs): 32 lb (14.5 kg)   Body Composition  Body Fat %: 41.5 % Fat Mass (lbs): 128.2 lbs Muscle Mass (lbs): 171.8 lbs Total Body Water (lbs): 142.2 lbs Visceral Fat Rating : 29   Other Clinical Data Fasting: Yes Labs: No Today's Visit #: 100 Starting Date: 12/28/16    Chief Complaint: OBESITY    History of Present Illness   The patient is a 77 year old individual with a history of obesity, vitamin D deficiency, diabetes, and depression with emotional eating. He has been maintaining his weight and adhering to his diet plan 95% of the time. He has been physically active, engaging in yard work and walking 4-5 times per week.  However, the patient reports a recent episode of extreme fatigue and generalized body pain following overexertion and heat exposure while working outdoors. This episode, which occurred on July 20th, left him immobile for several days. He also reports significant episodes of vertigo, with one incident causing him to almost fall backward. These episodes of vertigo have been accompanied by a spinning sensation and a feeling of disorientation in space.  The patient also reports a history of congestive heart failure, for which he was previously hospitalized. He has been on metoprolol for a couple of years and takes Lasix daily. He also reports a recent increase in his losartan dosage from 25 to 50 mg, which occurred approximately one to two months ago.  In addition to these symptoms, the patient has been experiencing shortness of breath. However, he reports being able to lie flat without discomfort. He also reports a history of ringing in his  ears, which has been ongoing for several years.  The patient's recent lab results from the end of June showed good kidney function, electrolyte balance, vitamin D levels, and an improving A1c level. He also reports a recent ear issue, which required medical intervention to remove a blockage. This occurred before the onset of his current symptoms.  The patient has a busy schedule planned, with trips to [place], [place], and [place] in the coming months. He expresses a desire to improve his health to fully enjoy these activities.          PHYSICAL EXAM:  Blood pressure 112/61, pulse 86, temperature 97.6 F (36.4 C), height 6\' 1"  (1.854 m), weight (!) 308 lb (139.7 kg), SpO2 94%. Body mass index is 40.64 kg/m.  DIAGNOSTIC DATA REVIEWED:  BMET    Component Value Date/Time   NA 138 01/26/2023 1131   K 4.3 01/26/2023 1131   CL 99 01/26/2023 1131   CO2 24 01/26/2023 1131   GLUCOSE 151 (H) 01/26/2023 1131   GLUCOSE 211 (H) 08/18/2022 0001   BUN 18 01/26/2023 1131   CREATININE 1.14 01/26/2023 1131   CREATININE 1.16 04/29/2016 0853   CALCIUM 9.6 01/26/2023 1131   GFRNONAA 42 (L) 08/18/2022 0001   GFRNONAA 64 04/29/2016 0853   GFRAA 64 09/16/2020 1206   GFRAA 74 04/29/2016 0853   Lab Results  Component Value Date   HGBA1C 7.0 (H) 02/08/2023   HGBA1C 5.6 07/15/2014   Lab Results  Component Value Date   INSULIN 22.7 03/07/2022   INSULIN 22.9 12/28/2016  Lab Results  Component Value Date   TSH 1.050 01/26/2023   CBC    Component Value Date/Time   WBC 13.1 (H) 08/18/2022 0001   RBC 5.76 08/18/2022 0001   HGB 16.2 08/18/2022 0001   HGB 15.8 03/07/2022 0809   HCT 49.1 08/18/2022 0001   HCT 48.7 03/07/2022 0809   PLT 333 08/18/2022 0001   PLT 254 03/07/2022 0809   MCV 85.2 08/18/2022 0001   MCV 87 03/07/2022 0809   MCH 28.1 08/18/2022 0001   MCHC 33.0 08/18/2022 0001   RDW 15.9 (H) 08/18/2022 0001   RDW 14.3 03/07/2022 0809   Iron Studies No results found for:  "IRON", "TIBC", "FERRITIN", "IRONPCTSAT" Lipid Panel     Component Value Date/Time   CHOL 113 04/18/2022 1020   TRIG 83 04/18/2022 1020   HDL 38 (L) 04/18/2022 1020   CHOLHDL 3.0 04/18/2022 1020   CHOLHDL 5.2 (H) 01/26/2016 1111   VLDL 46 (H) 01/26/2016 1111   LDLCALC 58 04/18/2022 1020   LDLDIRECT 148 (H) 11/15/2007 1709   Hepatic Function Panel     Component Value Date/Time   PROT 6.3 09/21/2022 0752   ALBUMIN 4.5 09/21/2022 0752   AST 23 09/21/2022 0752   ALT 44 09/21/2022 0752   ALKPHOS 62 09/21/2022 0752   BILITOT 0.6 09/21/2022 0752      Component Value Date/Time   TSH 1.050 01/26/2023 1131   Nutritional Lab Results  Component Value Date   VD25OH 63.9 02/08/2023   VD25OH 65.0 09/21/2022   VD25OH 60.9 03/07/2022     Assessment and Plan    Fatigue and Vertigo Recent onset of fatigue and vertigo following overexertion and heat exposure. Possible cardiac etiology given history of diastolic dysfunction and coronary artery disease. Also, possible inner ear issue given history of ear wax impaction and tinnitus. -Order labs including CBC, electrolytes, and BNP. -Refer to Cardiology for urgent evaluation, appointment scheduled for tomorrow. Pt to go to the Emergency room if chest pain occurs or shortness of breath worsens. -Consider ENT referral if vertigo persists after cardiac evaluation.  Diabetes A1c of 7 last month, on Ozempic 1mg  weekly. No signs of hypoglycemia -Continue current management.  Depression On Bupropion XL 300mg  daily. -Continue current management.  Vitamin D deficiency Recent labs showed normal Vitamin D levels. -Continue current supplementation.  Obesity Maintained weight over the last four weeks, following dietary plan 95% of the time. -Continue current management and encourage regular exercise.         I have personally spent 45 minutes total time today in preparation, patient care, and documentation for this visit, including the  following: review of clinical lab tests; review of medical tests/procedures/services.    He was informed of the importance of frequent follow up visits to maximize his success with intensive lifestyle modifications for his multiple health conditions.    Quillian Quince, MD
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
## 2023-03-09 ENCOUNTER — Ambulatory Visit: Payer: Medicare HMO | Attending: Physician Assistant | Admitting: Physician Assistant

## 2023-03-09 ENCOUNTER — Encounter: Payer: Self-pay | Admitting: Physician Assistant

## 2023-03-09 VITALS — BP 120/72 | HR 76 | Ht 73.0 in | Wt 311.0 lb

## 2023-03-09 DIAGNOSIS — R0609 Other forms of dyspnea: Secondary | ICD-10-CM | POA: Diagnosis not present

## 2023-03-09 DIAGNOSIS — Z9861 Coronary angioplasty status: Secondary | ICD-10-CM

## 2023-03-09 DIAGNOSIS — I6522 Occlusion and stenosis of left carotid artery: Secondary | ICD-10-CM | POA: Diagnosis not present

## 2023-03-09 DIAGNOSIS — I2510 Atherosclerotic heart disease of native coronary artery without angina pectoris: Secondary | ICD-10-CM | POA: Diagnosis not present

## 2023-03-09 DIAGNOSIS — E785 Hyperlipidemia, unspecified: Secondary | ICD-10-CM | POA: Diagnosis not present

## 2023-03-09 DIAGNOSIS — I10 Essential (primary) hypertension: Secondary | ICD-10-CM | POA: Diagnosis not present

## 2023-03-09 DIAGNOSIS — I493 Ventricular premature depolarization: Secondary | ICD-10-CM | POA: Diagnosis not present

## 2023-03-09 DIAGNOSIS — I5032 Chronic diastolic (congestive) heart failure: Secondary | ICD-10-CM

## 2023-03-09 NOTE — Patient Instructions (Signed)
Medication Instructions:  Increase Imdur to 45 mg ( Take 1.5 Tablet Daily). *If you need a refill on your cardiac medications before your next appointment, please call your pharmacy*   Lab Work: No Labs If you have labs (blood work) drawn today and your tests are completely normal, you will receive your results only by: MyChart Message (if you have MyChart) OR A paper copy in the mail If you have any lab test that is abnormal or we need to change your treatment, we will call you to review the results.   Testing/Procedures: [facility], [address]. Your physician has requested that you have an echocardiogram. Echocardiography is a painless test that uses sound waves to create images of your heart. It provides your doctor with information about the size and shape of your heart and how well your heart's chambers and valves are working. This procedure takes approximately one hour. There are no restrictions for this procedure. Please do NOT wear cologne, perfume, aftershave, or lotions (deodorant is allowed). Please arrive 15 minutes prior to your appointment time.    Follow-Up: At [facility], you and your health needs are our priority.  As part of our continuing mission to provide you with exceptional heart care, we have created designated Provider Care Teams.  These Care Teams include your primary Cardiologist (physician) and Advanced Practice Providers (APPs -  Physician Assistants and Nurse Practitioners) who all work together to provide you with the care you need, when you need it.  We recommend signing up for the patient portal called "MyChart".  Sign up information is provided on this After Visit Summary.  MyChart is used to connect with patients for Virtual Visits (Telemedicine).  Patients are able to view lab/test results, encounter notes, upcoming appointments, etc.  Non-urgent messages can be sent to your provider as well.   To learn more about what you  can do with MyChart, go to [email or web address].    Your next appointment:   2 week(s)  Provider:   First Available APP

## 2023-03-17 DIAGNOSIS — I10 Essential (primary) hypertension: Secondary | ICD-10-CM | POA: Diagnosis not present

## 2023-03-17 DIAGNOSIS — E119 Type 2 diabetes mellitus without complications: Secondary | ICD-10-CM | POA: Diagnosis not present
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
## 2023-03-23 ENCOUNTER — Ambulatory Visit: Payer: Medicare HMO | Attending: Physician Assistant

## 2023-03-23 DIAGNOSIS — E785 Hyperlipidemia, unspecified: Secondary | ICD-10-CM

## 2023-03-23 DIAGNOSIS — R0609 Other forms of dyspnea: Secondary | ICD-10-CM | POA: Diagnosis not present

## 2023-03-23 DIAGNOSIS — Z9861 Coronary angioplasty status: Secondary | ICD-10-CM | POA: Diagnosis not present

## 2023-03-23 DIAGNOSIS — I5032 Chronic diastolic (congestive) heart failure: Secondary | ICD-10-CM | POA: Diagnosis not present

## 2023-03-23 DIAGNOSIS — I2510 Atherosclerotic heart disease of native coronary artery without angina pectoris: Secondary | ICD-10-CM

## 2023-03-23 DIAGNOSIS — I6522 Occlusion and stenosis of left carotid artery: Secondary | ICD-10-CM | POA: Diagnosis not present

## 2023-03-23 DIAGNOSIS — I493 Ventricular premature depolarization: Secondary | ICD-10-CM | POA: Diagnosis not present

## 2023-03-23 DIAGNOSIS — I10 Essential (primary) hypertension: Secondary | ICD-10-CM | POA: Diagnosis not present

## 2023-03-24 ENCOUNTER — Ambulatory Visit: Payer: Medicare HMO | Admitting: Adult Health

## 2023-03-24 ENCOUNTER — Telehealth: Payer: Self-pay | Admitting: *Deleted

## 2023-03-24 NOTE — Telephone Encounter (Signed)
Spoke with pt regarding appointment cancellation for today, 03/24/23, due to provider illness. Rescheduled for 04/04/23 at 8:50am. Pt verbalized appreciation of call and denies questions at this time.

## 2023-03-27 LAB — ECHOCARDIOGRAM COMPLETE
AR max vel: 2.63 cm2
AV Area VTI: 2.41 cm2
AV Area mean vel: 2.37 cm2
AV Mean grad: 6 mmHg
AV Peak grad: 9.7 mmHg
Ao pk vel: 1.56 m/s
Area-P 1/2: 5.02 cm2
Calc EF: 66.8 %
MV VTI: 3.08 cm2
S' Lateral: 3.7 cm
Single Plane A2C EF: 69.7 %
Single Plane A4C EF: 63.9 %

## 2023-03-29 ENCOUNTER — Ambulatory Visit (INDEPENDENT_AMBULATORY_CARE_PROVIDER_SITE_OTHER): Payer: Medicare HMO | Admitting: Family Medicine
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
## 2023-04-01 DIAGNOSIS — I10 Essential (primary) hypertension: Secondary | ICD-10-CM | POA: Diagnosis not present

## 2023-04-01 DIAGNOSIS — E119 Type 2 diabetes mellitus without complications: Secondary | ICD-10-CM | POA: Diagnosis not present

## 2023-04-04 ENCOUNTER — Ambulatory Visit: Payer: Medicare HMO | Attending: Adult Health | Admitting: Adult Health

## 2023-04-04 ENCOUNTER — Encounter: Payer: Self-pay | Admitting: Adult Health

## 2023-04-04 VITALS — BP 136/88 | HR 77 | Ht 73.0 in | Wt 314.0 lb

## 2023-04-04 DIAGNOSIS — R0609 Other forms of dyspnea: Secondary | ICD-10-CM

## 2023-04-04 DIAGNOSIS — R42 Dizziness and giddiness: Secondary | ICD-10-CM | POA: Diagnosis not present

## 2023-04-04 DIAGNOSIS — E1169 Type 2 diabetes mellitus with other specified complication: Secondary | ICD-10-CM

## 2023-04-04 DIAGNOSIS — I25119 Atherosclerotic heart disease of native coronary artery with unspecified angina pectoris: Secondary | ICD-10-CM

## 2023-04-04 DIAGNOSIS — E7800 Pure hypercholesterolemia, unspecified: Secondary | ICD-10-CM

## 2023-04-04 DIAGNOSIS — Z7984 Long term (current) use of oral hypoglycemic drugs: Secondary | ICD-10-CM

## 2023-04-04 DIAGNOSIS — I6522 Occlusion and stenosis of left carotid artery: Secondary | ICD-10-CM | POA: Diagnosis not present

## 2023-04-04 DIAGNOSIS — I10 Essential (primary) hypertension: Secondary | ICD-10-CM | POA: Diagnosis not present

## 2023-04-04 NOTE — Patient Instructions (Signed)
Medication Instructions:  No changes *If you need a refill on your cardiac medications before your next appointment, please call your pharmacy*   Lab Work: BMET and CBC today. If you have labs (blood work) drawn today and your tests are completely normal, you will receive your results only by: MyChart Message (if you have MyChart) OR A paper copy in the mail If you have any lab test that is abnormal or we need to change your treatment, we will call you to review the results.   Testing/Procedures:       Cardiac/Peripheral Catheterization   You are scheduled for a Cardiac Catheterization on Friday, September 6 with Dr. Verne Carrow.  1. Please arrive at the [facility] (Main Entrance A) at [facility]: [address] [place], [place] [address] at 5:30 AM (This time is 2 hour(s) before your procedure to ensure your preparation). Free valet parking service is available. You will check in at ADMITTING. The support person will be asked to wait in the waiting room.  It is OK to have someone drop you off and come back when you are ready to be discharged.        Special note: Every effort is made to have your procedure done on time. Please understand that emergencies sometimes delay scheduled procedures.  2. Diet: Do not eat solid foods after midnight.  You may have clear liquids until 5 AM the day of the procedure.  3. Labs: You will need to have blood drawn on Tuesday, September 3 at [facility] [address], [place]  Open: 8am - 5pm (Lunch 12:30 - 1:30)   Phone: [phone]. You do not need to be fasting.  4. Medication instructions in preparation for your procedure:   Contrast Allergy: No    Hold Furosemide day of procedure.  Hold Glimepiride day of procedure.  Do not take Diabetes Med Glucophage (Metformin) on the day of the procedure and HOLD 48 HOURS AFTER THE PROCEDURE.  On the morning of your procedure, take Aspirin 81 mg and  Plavix/Clopidogrel and any morning medicines NOT listed above.  You may use sips of water.  5. Plan to go home the same day, you will only stay overnight if medically necessary. 6. You MUST have a responsible adult to drive you home. 7. An adult MUST be with you the first 24 hours after you arrive home. 8. Bring a current list of your medications, and the last time and date medication taken. 9. Bring ID and current insurance cards. 10.Please wear clothes that are easy to get on and off and wear slip-on shoes.  Thank you for allowing [name] to care for you!   -- [facility]    Follow-Up: At [facility], you and your health needs are our priority.  As part of our continuing mission to provide you with exceptional heart care, we have created designated Provider Care Teams.  These Care Teams include your primary Cardiologist (physician) and Advanced Practice Providers (APPs -  Physician Assistants and Nurse Practitioners) who all work together to provide you with the care you need, when you need it.  We recommend signing up for the patient portal called "MyChart".  Sign up information is provided on this After Visit Summary.  MyChart is used to connect with patients for Virtual Visits (Telemedicine).  Patients are able to view lab/test results, encounter notes, upcoming appointments, etc.  Non-urgent messages can be sent to your provider as well.  To learn more about what you can do with MyChart, go to [email or web address].    Your next appointment:    2 week(s)( Post-cath)  Provider:   Joni Reining, DNP, ANP

## 2023-04-05 LAB — BASIC METABOLIC PANEL
BUN/Creatinine Ratio: 16 (ref 10–24)
BUN: 18 mg/dL (ref 8–27)
CO2: 27 mmol/L (ref 20–29)
Calcium: 10 mg/dL (ref 8.6–10.2)
Chloride: 104 mmol/L (ref 96–106)
Creatinine, Ser: 1.15 mg/dL (ref 0.76–1.27)
Glucose: 134 mg/dL — ABNORMAL HIGH (ref 70–99)
Potassium: 4.3 mmol/L (ref 3.5–5.2)
Sodium: 143 mmol/L (ref 134–144)
eGFR: 66 mL/min/{1.73_m2} (ref >59)

## 2023-04-05 LAB — CBC
Hematocrit: 44.8 % (ref 37.5–51.0)
Hemoglobin: 14.9 g/dL (ref 13.0–17.7)
MCH: 29.3 pg (ref 26.6–33.0)
MCHC: 33.3 g/dL (ref 31.5–35.7)
MCV: 88 fL (ref 79–97)
Platelets: 254 10*3/uL (ref 150–450)
RBC: 5.08 x10E6/uL (ref 4.14–5.80)
RDW: 14 % (ref 11.6–15.4)
WBC: 7.8 10*3/uL (ref 3.4–10.8)

## 2023-04-06 ENCOUNTER — Telehealth: Payer: Self-pay | Admitting: *Deleted

## 2023-04-06 NOTE — Telephone Encounter (Signed)
Cardiac Catheterization scheduled at [facility] for: Friday April 07, 2023 7:30 AM Arrival time [facility] Main Entrance A at: 5:30 AM  Nothing to eat after midnight prior to procedure, clear liquids until 5 AM day of procedure.  Medication instructions: -Hold:  Metformin-day of procedure and 48 hours post procedure  Glimepiride-AM of procedure  Lasix-AM of procedure  Semaglutide-weekly on Sundays-pt reports last dose 04/02/23 -Other usual morning medications can be taken with sips of water including aspirin 81 mg and Plavix 75 mg..  Plan to go home the same day, you will only stay overnight if medically necessary.  You must have responsible adult to drive you home.  Someone must be with you the first 24 hours after you arrive home.  Reviewed procedure instructions with patient.

## 2023-04-07 ENCOUNTER — Ambulatory Visit (HOSPITAL_COMMUNITY)
Admission: RE | Disposition: A | Discharge status: Home / Self Care | Payer: Self-pay | Source: Home / Self Care | Attending: Cardiovascular Disease

## 2023-04-07 ENCOUNTER — Other Ambulatory Visit: Payer: Self-pay

## 2023-04-07 ENCOUNTER — Ambulatory Visit (HOSPITAL_COMMUNITY)
Admission: RE | Admit: 2023-04-07 | Discharge: 2023-04-07 | Disposition: A | Discharge status: Home / Self Care | Payer: Medicare HMO | Attending: Cardiovascular Disease | Admitting: Cardiovascular Disease

## 2023-04-07 DIAGNOSIS — R5383 Other fatigue: Secondary | ICD-10-CM | POA: Diagnosis not present

## 2023-04-07 DIAGNOSIS — E1151 Type 2 diabetes mellitus with diabetic peripheral angiopathy without gangrene: Secondary | ICD-10-CM | POA: Insufficient documentation

## 2023-04-07 DIAGNOSIS — I110 Hypertensive heart disease with heart failure: Secondary | ICD-10-CM | POA: Diagnosis not present

## 2023-04-07 DIAGNOSIS — G4733 Obstructive sleep apnea (adult) (pediatric): Secondary | ICD-10-CM | POA: Diagnosis not present

## 2023-04-07 DIAGNOSIS — I2582 Chronic total occlusion of coronary artery: Secondary | ICD-10-CM | POA: Diagnosis not present

## 2023-04-07 DIAGNOSIS — Z6841 Body Mass Index (BMI) 40.0 and over, adult: Secondary | ICD-10-CM | POA: Insufficient documentation

## 2023-04-07 DIAGNOSIS — K219 Gastro-esophageal reflux disease without esophagitis: Secondary | ICD-10-CM | POA: Insufficient documentation

## 2023-04-07 DIAGNOSIS — Z8546 Personal history of malignant neoplasm of prostate: Secondary | ICD-10-CM | POA: Insufficient documentation

## 2023-04-07 DIAGNOSIS — I25119 Atherosclerotic heart disease of native coronary artery with unspecified angina pectoris: Secondary | ICD-10-CM

## 2023-04-07 DIAGNOSIS — Z955 Presence of coronary angioplasty implant and graft: Secondary | ICD-10-CM | POA: Diagnosis not present

## 2023-04-07 DIAGNOSIS — I509 Heart failure, unspecified: Secondary | ICD-10-CM | POA: Insufficient documentation

## 2023-04-07 DIAGNOSIS — I2510 Atherosclerotic heart disease of native coronary artery without angina pectoris: Secondary | ICD-10-CM | POA: Insufficient documentation

## 2023-04-07 DIAGNOSIS — Z7984 Long term (current) use of oral hypoglycemic drugs: Secondary | ICD-10-CM | POA: Insufficient documentation

## 2023-04-07 DIAGNOSIS — Z8673 Personal history of transient ischemic attack (TIA), and cerebral infarction without residual deficits: Secondary | ICD-10-CM | POA: Insufficient documentation

## 2023-04-07 DIAGNOSIS — Z7985 Long-term (current) use of injectable non-insulin antidiabetic drugs: Secondary | ICD-10-CM | POA: Insufficient documentation

## 2023-04-07 DIAGNOSIS — E785 Hyperlipidemia, unspecified: Secondary | ICD-10-CM | POA: Insufficient documentation

## 2023-04-07 HISTORY — PX: LEFT HEART CATH AND CORONARY ANGIOGRAPHY: CATH118249

## 2023-04-07 LAB — GLUCOSE, CAPILLARY
Glucose-Capillary: 146 mg/dL — ABNORMAL HIGH (ref 70–99)
Glucose-Capillary: 142 mg/dL — ABNORMAL HIGH (ref 70–99)

## 2023-04-07 SURGERY — LEFT HEART CATH AND CORONARY ANGIOGRAPHY
Anesthesia: LOCAL

## 2023-04-07 MED ORDER — SODIUM CHLORIDE 0.9 % IV SOLN: 250.0000 mL | INTRAVENOUS | Status: DC | PRN

## 2023-04-07 MED ORDER — IOHEXOL 350 MG/ML SOLN
INTRAVENOUS | Status: DC | PRN
  Administered 2023-04-07: 22 mL via INTRA_ARTERIAL

## 2023-04-07 MED ORDER — LIDOCAINE HCL (PF) 1 % IJ SOLN
INTRAMUSCULAR | Status: DC | PRN
  Administered 2023-04-07: 2 mL via INTRADERMAL

## 2023-04-07 MED ORDER — FENTANYL CITRATE (PF) 100 MCG/2ML IJ SOLN
INTRAMUSCULAR | Status: AC
  Filled 2023-04-07: qty 2

## 2023-04-07 MED ORDER — SODIUM CHLORIDE 0.9% FLUSH: 3.0000 mL | INTRAVENOUS | Status: DC | PRN

## 2023-04-07 MED ORDER — SODIUM CHLORIDE 0.9 % WEIGHT BASED INFUSION
3.0000 mL/kg/h | INTRAVENOUS | Status: AC
  Administered 2023-04-07: 3 mL/kg/h via INTRAVENOUS

## 2023-04-07 MED ORDER — FENTANYL CITRATE (PF) 100 MCG/2ML IJ SOLN
INTRAMUSCULAR | Status: DC | PRN
  Administered 2023-04-07: 25 ug via INTRAVENOUS

## 2023-04-07 MED ORDER — HEPARIN (PORCINE) IN NACL 1000-0.9 UT/500ML-% IV SOLN
INTRAVENOUS | Status: DC | PRN
  Administered 2023-04-07 (×2): 500 mL

## 2023-04-07 MED ORDER — SODIUM CHLORIDE 0.9 % WEIGHT BASED INFUSION: 1.0000 mL/kg/h | INTRAVENOUS | Status: DC

## 2023-04-07 MED ORDER — HYDRALAZINE HCL 20 MG/ML IJ SOLN: 10.0000 mg | INTRAMUSCULAR | Status: DC | PRN

## 2023-04-07 MED ORDER — MIDAZOLAM HCL 2 MG/2ML IJ SOLN
INTRAMUSCULAR | Status: AC
  Filled 2023-04-07: qty 2

## 2023-04-07 MED ORDER — LABETALOL HCL 5 MG/ML IV SOLN: 10.0000 mg | INTRAVENOUS | Status: DC | PRN

## 2023-04-07 MED ORDER — VERAPAMIL HCL 2.5 MG/ML IV SOLN
INTRAVENOUS | Status: AC
  Filled 2023-04-07: qty 2

## 2023-04-07 MED ORDER — MIDAZOLAM HCL 2 MG/2ML IJ SOLN
INTRAMUSCULAR | Status: DC | PRN
  Administered 2023-04-07: 1 mg via INTRAVENOUS

## 2023-04-07 MED ORDER — SODIUM CHLORIDE 0.9% FLUSH: 3.0000 mL | Freq: Two times a day (BID) | INTRAVENOUS | Status: DC

## 2023-04-07 MED ORDER — ACETAMINOPHEN 325 MG PO TABS: 650.0000 mg | ORAL_TABLET | ORAL | Status: DC | PRN

## 2023-04-07 MED ORDER — ONDANSETRON HCL 4 MG/2ML IJ SOLN: 4.0000 mg | Freq: Four times a day (QID) | INTRAMUSCULAR | Status: DC | PRN

## 2023-04-07 MED ORDER — HEPARIN SODIUM (PORCINE) 1000 UNIT/ML IJ SOLN
INTRAMUSCULAR | Status: AC
  Filled 2023-04-07: qty 10

## 2023-04-07 MED ORDER — VERAPAMIL HCL 2.5 MG/ML IV SOLN
INTRAVENOUS | Status: DC | PRN
  Administered 2023-04-07: 10 mL via INTRA_ARTERIAL

## 2023-04-07 MED ORDER — LIDOCAINE HCL (PF) 1 % IJ SOLN
INTRAMUSCULAR | Status: AC
  Filled 2023-04-07: qty 30

## 2023-04-07 SURGICAL SUPPLY — 7 items
CATH 5FR JL3.5 JR4 ANG PIG MP (CATHETERS) IMPLANT
GLIDESHEATH SLEND SS 6F .021 (SHEATH) IMPLANT
GUIDEWIRE INQWIRE 1.5J.035X260 (WIRE) IMPLANT
INQWIRE 1.5J .035X260CM (WIRE) ×1
KIT SYRINGE INJ CVI SPIKEX1 (MISCELLANEOUS) IMPLANT
PACK CARDIAC CATHETERIZATION (CUSTOM PROCEDURE TRAY) ×1 IMPLANT
SET ATX-X65L (MISCELLANEOUS) IMPLANT

## 2023-04-07 NOTE — Interval H&P Note (Signed)
History and Physical Interval Note:  04/07/2023 7:27 AM  [name] [name]  has presented today for surgery, with the diagnosis of cad.  The various methods of treatment have been discussed with the patient and family. After consideration of risks, benefits and other options for treatment, the patient has consented to  Procedure(s): LEFT HEART CATH AND CORONARY ANGIOGRAPHY (N/A) as a surgical intervention.  The patient's history has been reviewed, patient examined, no change in status, stable for surgery.  I have reviewed the patient's chart and labs.  Questions were answered to the patient's satisfaction.    Cath Lab Visit (complete for each Cath Lab visit)  Clinical Evaluation Leading to the Procedure:   ACS: No.  Non-ACS:    Anginal Classification: CCS II  Anti-ischemic medical therapy: Maximal Therapy (2 or more classes of medications)  Non-Invasive Test Results: No non-invasive testing performed  Prior CABG: No previous CABG        Verne Carrow

## 2023-04-07 NOTE — Progress Notes (Signed)
TR BAND REMOVAL  LOCATION:    right radial  DEFLATED PER PROTOCOL:    Yes.    TIME BAND OFF / DRESSING APPLIED:    0950 gauze dressing applied   SITE UPON ARRIVAL:    Level 0  SITE AFTER BAND REMOVAL:    Level 0  CIRCULATION SENSATION AND MOVEMENT:    Within Normal Limits   Yes.    COMMENTS:   no issues noted

## 2023-04-07 NOTE — Discharge Instructions (Addendum)
Hold Metformin for 48 hours post cath Take Lasix 40 mg twice daily for the next three days then go back to taking Lasix 40 mg once daily Radial Site Care Drink plenty of fluid for the next 3 days  Keep arm elevated for the next 24 hours The following information offers guidance on how to care for yourself after your procedure. Your health care provider may also give you more specific instructions. If you have problems or questions, contact your health care provider. What can I expect after the procedure? After the procedure, it is common to have bruising and tenderness in the incision area. Follow these instructions at home: Incision site care  Follow instructions from your health care provider about how to take care of your incision site. Make sure you:  Remove your dressing 24 hours after discharge.  Do not take baths, swim, or use a hot tub for 1 week. You may shower 24 hours after the procedure or as told by your health care provider. Remove the dressing and gently wash the incision area with plain soap and water. Pat the area dry with a clean towel. Do not rub the site. That could cause bleeding. Do not apply powder or lotion to the site. Check your incision site every day for signs of infection. Check for: Redness, swelling, or pain. Fluid or blood. Warmth. Pus or a bad smell. Activity For 24 hours after the procedure, or as directed by your health care provider: Do not flex or bend the affected arm. Do not push or pull heavy objects with the affected arm. Do not operate machinery or power tools. Do not drive. You should not drive yourself home from the hospital or clinic if you go home during that time period. You may drive 24 hours after the procedure unless your health care provider tells you not to. Do not lift anything that is heavier than 10 lb (4.5 kg), or the limit that you are told, until your health care provider says that it is safe. Return to your normal activities  as told by your health care provider. Ask your health care provider what activities are safe for you and when you can return to work. If you were given a sedative during the procedure, it can affect you for several hours. Do not drive or operate machinery until your health care provider says that it is safe. General instructions Take over-the-counter and prescription medicines only as told by your health care provider. If you will be going home right after the procedure, plan to have a responsible adult care for you for the time you are told. This is important. Keep all follow-up visits. This is important. Contact a health care provider if: You have a fever or chills. You have any of these signs of infection at your incision site: Redness, swelling, or pain. Fluid or blood. Warmth. Pus or a bad smell. Get help right away if: The incision area swells very fast. The incision area is bleeding, and the bleeding does not stop when you hold steady pressure on the area. Your arm or hand becomes pale, cool, tingly, or numb. These symptoms may represent a serious problem that is an emergency. Do not wait to see if the symptoms will go away. Get medical help right away. Call your local emergency services (911 in the [place] Do not drive yourself to the hospital. Summary After the procedure, it is common to have bruising and tenderness at the incision site. Follow instructions from your  health care provider about how to take care of your radial site incision. Check the incision every day for signs of infection. Do not lift anything that is heavier than 10 lb (4.5 kg), or the limit that you are told, until your health care provider says that it is safe. Get help right away if the incision area swells very fast, you have bleeding at the incision site that will not stop, or your arm or hand becomes pale, cool, or numb. This information is not intended to replace advice given to you by your health care  provider. Make sure you discuss any questions you have with your health care provider. Document Revised: 09/06/2020 Document Reviewed: 09/06/2020 Elsevier Patient Education  2023 [employer].

## 2023-04-10 ENCOUNTER — Encounter (HOSPITAL_COMMUNITY): Payer: Self-pay | Admitting: Cardiovascular Disease

## 2023-04-11 MED FILL — Heparin Sodium (Porcine) Inj 1000 Unit/ML: INTRAMUSCULAR | Qty: 10 | Status: AC

## 2023-04-12 ENCOUNTER — Telehealth: Payer: Self-pay

## 2023-04-12 NOTE — Telephone Encounter (Addendum)
Called patient regarding results. Left message for patient.----- Message from Joni Reining sent at 04/04/2023  4:53 PM EDT ----- I have reviewed his labs. Okay to proceed with cardiac cath on Friday.

## 2023-04-14 ENCOUNTER — Other Ambulatory Visit: Payer: Self-pay

## 2023-04-14 DIAGNOSIS — F3289 Other specified depressive episodes: Secondary | ICD-10-CM

## 2023-04-14 DIAGNOSIS — E1159 Type 2 diabetes mellitus with other circulatory complications: Secondary | ICD-10-CM

## 2023-04-16 DIAGNOSIS — I10 Essential (primary) hypertension: Secondary | ICD-10-CM | POA: Diagnosis not present

## 2023-04-16 DIAGNOSIS — E119 Type 2 diabetes mellitus without complications: Secondary | ICD-10-CM | POA: Diagnosis not present

## 2023-04-16 MED ORDER — GLIMEPIRIDE 2 MG PO TABS
ORAL_TABLET | ORAL | 0 refills | Status: DC
Start: 2023-04-16 — End: 2023-05-10

## 2023-04-16 MED ORDER — BUPROPION HCL ER (XL) 300 MG PO TB24
300.0000 mg | ORAL_TABLET | Freq: Every day | ORAL | 0 refills | Status: DC
Start: 2023-04-16 — End: 2023-06-09

## 2023-04-20 DIAGNOSIS — L570 Actinic keratosis: Secondary | ICD-10-CM | POA: Diagnosis not present

## 2023-04-20 DIAGNOSIS — D2362 Other benign neoplasm of skin of left upper limb, including shoulder: Secondary | ICD-10-CM | POA: Diagnosis not present

## 2023-04-20 DIAGNOSIS — L821 Other seborrheic keratosis: Secondary | ICD-10-CM | POA: Diagnosis not present

## 2023-04-20 DIAGNOSIS — D692 Other nonthrombocytopenic purpura: Secondary | ICD-10-CM | POA: Diagnosis not present

## 2023-04-20 DIAGNOSIS — D1801 Hemangioma of skin and subcutaneous tissue: Secondary | ICD-10-CM | POA: Diagnosis not present

## 2023-04-20 DIAGNOSIS — L814 Other melanin hyperpigmentation: Secondary | ICD-10-CM | POA: Diagnosis not present

## 2023-04-20 DIAGNOSIS — Z85828 Personal history of other malignant neoplasm of skin: Secondary | ICD-10-CM | POA: Diagnosis not present

## 2023-04-20 DIAGNOSIS — L438 Other lichen planus: Secondary | ICD-10-CM | POA: Diagnosis not present
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
## 2023-04-21 ENCOUNTER — Ambulatory Visit: Payer: Medicare HMO | Attending: Adult Health | Admitting: Adult Health

## 2023-04-21 ENCOUNTER — Encounter: Payer: Self-pay | Admitting: Adult Health

## 2023-04-21 VITALS — BP 120/72 | HR 85 | Ht 73.0 in | Wt 308.2 lb

## 2023-04-21 DIAGNOSIS — R9431 Abnormal electrocardiogram [ECG] [EKG]: Secondary | ICD-10-CM

## 2023-04-21 DIAGNOSIS — E7800 Pure hypercholesterolemia, unspecified: Secondary | ICD-10-CM | POA: Diagnosis not present

## 2023-04-21 DIAGNOSIS — I2510 Atherosclerotic heart disease of native coronary artery without angina pectoris: Secondary | ICD-10-CM

## 2023-04-21 DIAGNOSIS — I10 Essential (primary) hypertension: Secondary | ICD-10-CM

## 2023-04-21 DIAGNOSIS — I5042 Chronic combined systolic (congestive) and diastolic (congestive) heart failure: Secondary | ICD-10-CM

## 2023-04-21 NOTE — Patient Instructions (Signed)
Medication Instructions:  No Changes *If you need a refill on your cardiac medications before your next appointment, please call your pharmacy*   Lab Work: No Labs If you have labs (blood work) drawn today and your tests are completely normal, you will receive your results only by: MyChart Message (if you have MyChart) OR A paper copy in the mail If you have any lab test that is abnormal or we need to change your treatment, we will call you to review the results.   Testing/Procedures: No Testing   Follow-Up: At [facility], you and your health needs are our priority.  As part of our continuing mission to provide you with exceptional heart care, we have created designated Provider Care Teams.  These Care Teams include your primary Cardiologist (physician) and Advanced Practice Providers (APPs -  Physician Assistants and Nurse Practitioners) who all work together to provide you with the care you need, when you need it.  We recommend signing up for the patient portal called "MyChart".  Sign up information is provided on this After Visit Summary.  MyChart is used to connect with patients for Virtual Visits (Telemedicine).  Patients are able to view lab/test results, encounter notes, upcoming appointments, etc.  Non-urgent messages can be sent to your provider as well.   To learn more about what you can do with MyChart, go to [email or web address].    Your next appointment:   6 month(s)  Provider:   Isabel Caprice, MD

## 2023-04-24 ENCOUNTER — Telehealth: Payer: Self-pay | Admitting: *Deleted

## 2023-04-24 ENCOUNTER — Telehealth: Payer: Self-pay

## 2023-04-24 NOTE — Telephone Encounter (Signed)
I spoke with patient and scheduled him to see Dr Royann Shivers on October 16, 2023

## 2023-04-24 NOTE — Telephone Encounter (Signed)
Received message that patient will need 6 month follow up appointment after recent visit with Harriet Pho, NP.  Reviewed with Dr Eldridge Dace and he recommends patient follow up with Dr Royann Shivers.  Call placed to patient to schedule appointment.  Left message to call office

## 2023-04-24 NOTE — Telephone Encounter (Addendum)
Results discussed with patient during office visit. Patient had understanding of results.----- Message from Joni Reining sent at 04/04/2023  4:53 PM EDT ----- I have reviewed his labs. Okay to proceed with cardiac cath on Friday.

## 2023-04-26 ENCOUNTER — Ambulatory Visit (INDEPENDENT_AMBULATORY_CARE_PROVIDER_SITE_OTHER): Payer: Medicare HMO | Admitting: Family Medicine

## 2023-04-26 ENCOUNTER — Encounter (INDEPENDENT_AMBULATORY_CARE_PROVIDER_SITE_OTHER): Payer: Self-pay | Admitting: Family Medicine

## 2023-04-26 VITALS — BP 125/70 | HR 72 | Temp 97.8°F | Ht 73.0 in | Wt 306.0 lb

## 2023-04-26 DIAGNOSIS — Z639 Problem related to primary support group, unspecified: Secondary | ICD-10-CM | POA: Diagnosis not present

## 2023-04-26 DIAGNOSIS — E559 Vitamin D deficiency, unspecified: Secondary | ICD-10-CM

## 2023-04-26 DIAGNOSIS — E669 Obesity, unspecified: Secondary | ICD-10-CM

## 2023-04-26 DIAGNOSIS — I5030 Unspecified diastolic (congestive) heart failure: Secondary | ICD-10-CM

## 2023-04-26 DIAGNOSIS — Z6841 Body Mass Index (BMI) 40.0 and over, adult: Secondary | ICD-10-CM

## 2023-04-26 MED ORDER — VITAMIN D (ERGOCALCIFEROL) 1.25 MG (50000 UNIT) PO CAPS
50000.0000 [IU] | ORAL_CAPSULE | ORAL | 0 refills | Status: DC
Start: 2023-04-26 — End: 2023-05-24

## 2023-04-26 NOTE — Progress Notes (Unsigned)
Chief Complaint:   OBESITY [name] [name] is here to discuss his progress with his obesity treatment plan along with follow-up of his obesity related diagnoses. [name] [name] is on the Category 3 Plan and states he is following his eating plan approximately 90-95% of the time. [name] [name] states he is walking and doing chair exercise for 25-30 minutes 2-3 times per week.  Today's visit was #: 101 Starting weight: 340 lbs Starting date: 12/28/2016 Today's weight: 306 lbs Today's date: 04/26/2023 Total lbs lost to date: 34 Total lbs lost since last in-office visit: 2  Interim History: Patient has done well with his weight loss on his category 3 plan.  He is working on following his plan and trying to decrease emotional eating behavior.  Subjective:   1. CHF (congestive heart failure), NYHA class II, unspecified failure chronicity, diastolic (HCC) Patient had his Lasix increased after seeing Cardiology.  He has had a cath which showed some blockage, but not enough to stent.  2. Vitamin D deficiency Patient is on vitamin D with no side effects noted.  He requests a refill today.  3. Family dysfunction Patient's niece has been in trouble in jail.  He is stressed about the situation as expected and he is working on offering support without hurting himself or his finances.  He appears to be managing this stress as best as could be expected.  Assessment/Plan:   1. CHF (congestive heart failure), NYHA class II, unspecified failure chronicity, diastolic (HCC) Patient will continue to work on his diet and decrease sodium to help control his congestive heart failure.  We will continue to monitor.  2. Vitamin D deficiency Patient will continue vitamin D prescription every 14 days, and we will refill for 1 month.  - Vitamin D, Ergocalciferol, (DRISDOL) 1.25 MG (50000 UNIT) CAPS capsule; Take 1 capsule (50,000 Units total) by mouth every 14 (fourteen) days.  Dispense: 2 capsule; Refill: 0  3. Family  dysfunction Patient is to continue Wellbutrin as he has been on it for a while.  We will continue to monitor his situation and offer support as needed.  4. BMI 40.0-44.9, adult (HCC)  5. Obesity, Beginning BMI 44.86 [name] [name] is currently in the action stage of change. As such, his goal is to continue with weight loss efforts. He has agreed to the Category 3 Plan.   Exercise goals: As is.   Behavioral modification strategies: meal planning and cooking strategies.  [name] [name] has agreed to follow-up with our clinic in 3 to 4 weeks. He was informed of the importance of frequent follow-up visits to maximize his success with intensive lifestyle modifications for his multiple health conditions.   Objective:   Blood pressure 125/70, pulse 72, temperature 97.8 F (36.6 C), height 6\' 1"  (1.854 m), weight (!) 306 lb (138.8 kg), SpO2 95%. Body mass index is 40.37 kg/m.  Lab Results  Component Value Date   CREATININE 1.15 04/04/2023   BUN 18 04/04/2023   NA 143 04/04/2023   K 4.3 04/04/2023   CL 104 04/04/2023   CO2 27 04/04/2023   Lab Results  Component Value Date   ALT 97 (H) 03/08/2023   AST 46 (H) 03/08/2023   ALKPHOS 64 03/08/2023   BILITOT 0.5 03/08/2023   Lab Results  Component Value Date   HGBA1C 7.0 (H) 02/08/2023   HGBA1C 7.4 (H) 09/21/2022   HGBA1C 10.1 (H) 03/07/2022   HGBA1C 10.9 (H) 02/07/2022   HGBA1C 6.4 09/14/2021   Lab Results  Component Value  Date   INSULIN 22.7 03/07/2022   INSULIN 12.8 02/07/2022   INSULIN 11.7 02/22/2021   INSULIN 9.1 09/16/2020   INSULIN 10.0 08/14/2019   Lab Results  Component Value Date   TSH 1.050 01/26/2023   Lab Results  Component Value Date   CHOL 113 04/18/2022   HDL 38 (L) 04/18/2022   LDLCALC 58 04/18/2022   LDLDIRECT 148 (H) 11/15/2007   TRIG 83 04/18/2022   CHOLHDL 3.0 04/18/2022   Lab Results  Component Value Date   VD25OH 63.9 02/08/2023   VD25OH 65.0 09/21/2022   VD25OH 60.9 03/07/2022   Lab Results   Component Value Date   WBC 7.8 04/04/2023   HGB 14.9 04/04/2023   HCT 44.8 04/04/2023   MCV 88 04/04/2023   PLT 254 04/04/2023   No results found for: "IRON", "TIBC", "FERRITIN"  Attestation Statements:   Reviewed by clinician on day of visit: allergies, medications, problem list, medical history, surgical history, family history, social history, and previous encounter notes.   I, Burt Knack, am acting as transcriptionist for Quillian Quince, MD.  I have reviewed the above documentation for accuracy and completeness, and I agree with the above. -  Quillian Quince, MD

## 2023-05-04 ENCOUNTER — Other Ambulatory Visit: Payer: Self-pay

## 2023-05-04 DIAGNOSIS — N3941 Urge incontinence: Secondary | ICD-10-CM

## 2023-05-04 DIAGNOSIS — F3289 Other specified depressive episodes: Secondary | ICD-10-CM

## 2023-05-06 MED ORDER — ESCITALOPRAM OXALATE 20 MG PO TABS
20.0000 mg | ORAL_TABLET | Freq: Every day | ORAL | 5 refills | Status: DC
Start: 2023-05-06 — End: 2023-09-26

## 2023-05-06 MED ORDER — SOLIFENACIN SUCCINATE 5 MG PO TABS
5.0000 mg | ORAL_TABLET | Freq: Every day | ORAL | 0 refills | Status: DC
Start: 2023-05-06 — End: 2023-05-10

## 2023-05-09 ENCOUNTER — Other Ambulatory Visit: Payer: Self-pay | Admitting: Student

## 2023-05-09 ENCOUNTER — Other Ambulatory Visit (INDEPENDENT_AMBULATORY_CARE_PROVIDER_SITE_OTHER): Payer: Self-pay | Admitting: Family Medicine

## 2023-05-09 DIAGNOSIS — N3941 Urge incontinence: Secondary | ICD-10-CM

## 2023-05-09 DIAGNOSIS — I10 Essential (primary) hypertension: Secondary | ICD-10-CM

## 2023-05-09 DIAGNOSIS — E7849 Other hyperlipidemia: Secondary | ICD-10-CM

## 2023-05-09 DIAGNOSIS — E1159 Type 2 diabetes mellitus with other circulatory complications: Secondary | ICD-10-CM

## 2023-05-09 DIAGNOSIS — E559 Vitamin D deficiency, unspecified: Secondary | ICD-10-CM

## 2023-05-09 DIAGNOSIS — E1169 Type 2 diabetes mellitus with other specified complication: Secondary | ICD-10-CM

## 2023-05-16 DIAGNOSIS — I10 Essential (primary) hypertension: Secondary | ICD-10-CM | POA: Diagnosis not present

## 2023-05-16 DIAGNOSIS — E119 Type 2 diabetes mellitus without complications: Secondary | ICD-10-CM | POA: Diagnosis not present

## 2023-05-16 DIAGNOSIS — Z8546 Personal history of malignant neoplasm of prostate: Secondary | ICD-10-CM | POA: Diagnosis not present

## 2023-05-16 DIAGNOSIS — N3946 Mixed incontinence: Secondary | ICD-10-CM | POA: Diagnosis not present

## 2023-05-16 DIAGNOSIS — C61 Malignant neoplasm of prostate: Secondary | ICD-10-CM | POA: Diagnosis not present

## 2023-05-18 ENCOUNTER — Ambulatory Visit: Payer: Medicare HMO

## 2023-05-18 DIAGNOSIS — Z23 Encounter for immunization: Secondary | ICD-10-CM

## 2023-05-18 NOTE — Progress Notes (Signed)
Patient presents to nurse clinic for flu and COVID vaccination. See immunization flowsheet.   Patient observed 15 minutes post COVID vaccination. No signs of adverse reaction noted.   Veronda Prude, RN

## 2023-05-24 ENCOUNTER — Encounter (INDEPENDENT_AMBULATORY_CARE_PROVIDER_SITE_OTHER): Payer: Self-pay | Admitting: Family Medicine

## 2023-05-24 ENCOUNTER — Ambulatory Visit (INDEPENDENT_AMBULATORY_CARE_PROVIDER_SITE_OTHER): Payer: Medicare HMO | Admitting: Family Medicine

## 2023-05-24 VITALS — BP 117/61 | HR 82 | Temp 97.9°F | Ht 73.0 in | Wt 299.0 lb

## 2023-05-24 DIAGNOSIS — Z6839 Body mass index (BMI) 39.0-39.9, adult: Secondary | ICD-10-CM

## 2023-05-24 DIAGNOSIS — Z7985 Long-term (current) use of injectable non-insulin antidiabetic drugs: Secondary | ICD-10-CM

## 2023-05-24 DIAGNOSIS — Z7984 Long term (current) use of oral hypoglycemic drugs: Secondary | ICD-10-CM | POA: Diagnosis not present

## 2023-05-24 DIAGNOSIS — E669 Obesity, unspecified: Secondary | ICD-10-CM | POA: Diagnosis not present

## 2023-05-24 DIAGNOSIS — E1169 Type 2 diabetes mellitus with other specified complication: Secondary | ICD-10-CM

## 2023-05-24 DIAGNOSIS — E559 Vitamin D deficiency, unspecified: Secondary | ICD-10-CM | POA: Diagnosis not present

## 2023-05-24 DIAGNOSIS — Z6841 Body Mass Index (BMI) 40.0 and over, adult: Secondary | ICD-10-CM | POA: Diagnosis not present

## 2023-05-24 MED ORDER — VITAMIN D (ERGOCALCIFEROL) 1.25 MG (50000 UNIT) PO CAPS
50000.0000 [IU] | ORAL_CAPSULE | ORAL | 0 refills | Status: DC
Start: 2023-05-24 — End: 2023-06-21

## 2023-05-24 NOTE — Progress Notes (Signed)
.smr  Office: [phone]  /  Fax: [phone]  WEIGHT SUMMARY AND BIOMETRICS  Anthropometric Measurements Height: 6\' 1"  (1.854 m) Weight: 299 lb (135.6 kg) BMI (Calculated): 39.46 Weight at Last Visit: 306 lb Weight Lost Since Last Visit: 7 lb Weight Gained Since Last Visit: 0 Starting Weight: 340 lb Total Weight Loss (lbs): 41 lb (18.6 kg) Peak Weight: 353 lb   Body Composition  Body Fat %: 39.9 % Fat Mass (lbs): 119.4 lbs Muscle Mass (lbs): 171 lbs Total Body Water (lbs): 136.8 lbs Visceral Fat Rating : 28   Other Clinical Data Fasting: no Labs: no Today's Visit #: 101 Starting Date: 12/28/16    Chief Complaint: OBESITY   History of Present Illness   The patient, diagnosed with type two diabetes and obesity, has been actively managing his health. He reports a weight loss of seven pounds over the past month, largely attributed to adherence to a category three eating plan and regular walking exercise. The patient has been experiencing occasional dizziness, but reports that these episodes have been less frequent and less severe recently. He has been under the care of a cardiologist, who recently retired, and is in the process of transitioning to a new provider.  The patient also reports a history of tinnitus, which has significantly improved since a recent ear cleaning procedure. The ringing is now described as very faint. He has been referred to an ear, nose, and throat specialist, but the patient does not feel the need for this at present.  The patient's blood sugar levels have been relatively stable, with readings ranging from 120 to 200. He is due for an A1c check at his next appointment. The patient is also planning a cruise trip in the near future and is actively managing his health to ensure he is fit for the trip.          PHYSICAL EXAM:  Blood pressure 117/61, pulse 82, temperature 97.9 F (36.6 C), height 6\' 1"  (1.854 m), weight 299 lb (135.6 kg), SpO2  97%. Body mass index is 39.45 kg/m.  DIAGNOSTIC DATA REVIEWED:  BMET    Component Value Date/Time   NA 143 04/04/2023 1005   K 4.3 04/04/2023 1005   CL 104 04/04/2023 1005   CO2 27 04/04/2023 1005   GLUCOSE 134 (H) 04/04/2023 1005   GLUCOSE 211 (H) 08/18/2022 0001   BUN 18 04/04/2023 1005   CREATININE 1.15 04/04/2023 1005   CREATININE 1.16 04/29/2016 0853   CALCIUM 10.0 04/04/2023 1005   GFRNONAA 42 (L) 08/18/2022 0001   GFRNONAA 64 04/29/2016 0853   GFRAA 64 09/16/2020 1206   GFRAA 74 04/29/2016 0853   Lab Results  Component Value Date   HGBA1C 7.0 (H) 02/08/2023   HGBA1C 5.6 07/15/2014   Lab Results  Component Value Date   INSULIN 22.7 03/07/2022   INSULIN 22.9 12/28/2016   Lab Results  Component Value Date   TSH 1.050 01/26/2023   CBC    Component Value Date/Time   WBC 7.8 04/04/2023 1005   WBC 13.1 (H) 08/18/2022 0001   RBC 5.08 04/04/2023 1005   RBC 5.76 08/18/2022 0001   HGB 14.9 04/04/2023 1005   HCT 44.8 04/04/2023 1005   PLT 254 04/04/2023 1005   MCV 88 04/04/2023 1005   MCH 29.3 04/04/2023 1005   MCH 28.1 08/18/2022 0001   MCHC 33.3 04/04/2023 1005   MCHC 33.0 08/18/2022 0001   RDW 14.0 04/04/2023 1005   Iron Studies No results found for: "  IRON", "TIBC", "FERRITIN", "IRONPCTSAT" Lipid Panel     Component Value Date/Time   CHOL 113 04/18/2022 1020   TRIG 83 04/18/2022 1020   HDL 38 (L) 04/18/2022 1020   CHOLHDL 3.0 04/18/2022 1020   CHOLHDL 5.2 (H) 01/26/2016 1111   VLDL 46 (H) 01/26/2016 1111   LDLCALC 58 04/18/2022 1020   LDLDIRECT 148 (H) 11/15/2007 1709   Hepatic Function Panel     Component Value Date/Time   PROT 6.0 03/08/2023 0756   ALBUMIN 4.0 03/08/2023 0756   AST 46 (H) 03/08/2023 0756   ALT 97 (H) 03/08/2023 0756   ALKPHOS 64 03/08/2023 0756   BILITOT 0.5 03/08/2023 0756      Component Value Date/Time   TSH 1.050 01/26/2023 1131   Nutritional Lab Results  Component Value Date   VD25OH 63.9 02/08/2023   VD25OH  65.0 09/21/2022   VD25OH 60.9 03/07/2022     Assessment and Plan    Obesity Significant progress with weight loss, down 7 pounds in the last month. Adherence to category three eating plan and regular walking exercise. -Continue current category 3 eating plan and exercise regimen. -Avoid Halloween candy with upcoming holiday  Type 2 Diabetes Blood sugars ranging from 120-200. No reported hypoglycemia. -Continue current management. -Check A1c at next appointment on 06/21/2023.   Vit D deficiency -refill vitamin D and check labs at next visit.   General Health Maintenance -Plan to fast for labs at next appointment on 06/21/2023. -Schedule December appointment.         He was informed of the importance of frequent follow up visits to maximize his success with intensive lifestyle modifications for his multiple health conditions.    Quillian Quince, MD

## 2023-05-28 ENCOUNTER — Other Ambulatory Visit: Payer: Self-pay | Admitting: Student

## 2023-05-28 DIAGNOSIS — I10 Essential (primary) hypertension: Secondary | ICD-10-CM

## 2023-06-01 DIAGNOSIS — I10 Essential (primary) hypertension: Secondary | ICD-10-CM | POA: Diagnosis not present

## 2023-06-01 DIAGNOSIS — E119 Type 2 diabetes mellitus without complications: Secondary | ICD-10-CM | POA: Diagnosis not present

## 2023-06-07 ENCOUNTER — Other Ambulatory Visit: Payer: Self-pay | Admitting: Student

## 2023-06-07 ENCOUNTER — Other Ambulatory Visit (INDEPENDENT_AMBULATORY_CARE_PROVIDER_SITE_OTHER): Payer: Self-pay | Admitting: Family Medicine

## 2023-06-07 DIAGNOSIS — E559 Vitamin D deficiency, unspecified: Secondary | ICD-10-CM

## 2023-06-07 DIAGNOSIS — F3289 Other specified depressive episodes: Secondary | ICD-10-CM

## 2023-06-08 ENCOUNTER — Other Ambulatory Visit: Payer: Self-pay | Admitting: Physician Assistant

## 2023-06-15 DIAGNOSIS — I10 Essential (primary) hypertension: Secondary | ICD-10-CM | POA: Diagnosis not present

## 2023-06-15 DIAGNOSIS — E119 Type 2 diabetes mellitus without complications: Secondary | ICD-10-CM | POA: Diagnosis not present

## 2023-06-21 ENCOUNTER — Ambulatory Visit (INDEPENDENT_AMBULATORY_CARE_PROVIDER_SITE_OTHER): Payer: Medicare HMO | Admitting: Family Medicine

## 2023-06-21 ENCOUNTER — Encounter (INDEPENDENT_AMBULATORY_CARE_PROVIDER_SITE_OTHER): Payer: Self-pay | Admitting: Family Medicine

## 2023-06-21 VITALS — BP 113/69 | HR 74 | Temp 98.1°F | Ht 73.0 in | Wt 294.0 lb

## 2023-06-21 DIAGNOSIS — E559 Vitamin D deficiency, unspecified: Secondary | ICD-10-CM

## 2023-06-21 DIAGNOSIS — Z6838 Body mass index (BMI) 38.0-38.9, adult: Secondary | ICD-10-CM

## 2023-06-21 DIAGNOSIS — E1169 Type 2 diabetes mellitus with other specified complication: Secondary | ICD-10-CM

## 2023-06-21 DIAGNOSIS — Z7985 Long-term (current) use of injectable non-insulin antidiabetic drugs: Secondary | ICD-10-CM

## 2023-06-21 DIAGNOSIS — E669 Obesity, unspecified: Secondary | ICD-10-CM | POA: Diagnosis not present

## 2023-06-21 DIAGNOSIS — E119 Type 2 diabetes mellitus without complications: Secondary | ICD-10-CM | POA: Diagnosis not present

## 2023-06-21 MED ORDER — VITAMIN D (ERGOCALCIFEROL) 1.25 MG (50000 UNIT) PO CAPS
50000.0000 [IU] | ORAL_CAPSULE | ORAL | 0 refills | Status: DC
Start: 2023-06-21 — End: 2023-08-16

## 2023-06-21 NOTE — Progress Notes (Signed)
.smr  Office: [phone]  /  Fax: [phone]  WEIGHT SUMMARY AND BIOMETRICS  Anthropometric Measurements Height: 6\' 1"  (1.854 m) Weight: 294 lb (133.4 kg) BMI (Calculated): 38.8 Weight at Last Visit: 299 lb Weight Lost Since Last Visit: 5 Weight Gained Since Last Visit: 0 Starting Weight: 340 lb Total Weight Loss (lbs): 46 lb (20.9 kg) Peak Weight: 353 lb   Body Composition  Body Fat %: 39.8 % Fat Mass (lbs): 117 lbs Muscle Mass (lbs): 168.6 lbs Total Body Water (lbs): 131.8 lbs Visceral Fat Rating : 27   Other Clinical Data Fasting: no Labs: no Today's Visit #: 102 Starting Date: 12/28/16    Chief Complaint: OBESITY   History of Present Illness   The patient, diagnosed with type two diabetes, vitamin D deficiency, and obesity, presents for a routine follow-up. He reports adherence to a category three eating plan 90% of the time and has increased his exercise regimen to include walking most days of the week for 15-20 minutes. This lifestyle modification has resulted in a weight loss of five pounds in the last month.  The patient is currently on Ozempic 2 mg weekly and prescription vitamin D 50,000 international units per week. He has been monitoring his blood sugars, which have ranged from 60 to 135. He reports no issues with hunger control.  The patient recently traveled to [place] to assist his brother post-surgery. Despite the travel and change in routine, he managed to maintain his blood sugar levels and continue his weight loss efforts. He plans to spend the upcoming holidays alone and has a trip to [place] planned for January.          PHYSICAL EXAM:  Blood pressure 113/69, pulse 74, temperature 98.1 F (36.7 C), height 6\' 1"  (1.854 m), weight 294 lb (133.4 kg), SpO2 95%. Body mass index is 38.79 kg/m.  DIAGNOSTIC DATA REVIEWED:  BMET    Component Value Date/Time   NA 143 04/04/2023 1005   K 4.3 04/04/2023 1005   CL 104 04/04/2023 1005   CO2 27  04/04/2023 1005   GLUCOSE 134 (H) 04/04/2023 1005   GLUCOSE 211 (H) 08/18/2022 0001   BUN 18 04/04/2023 1005   CREATININE 1.15 04/04/2023 1005   CREATININE 1.16 04/29/2016 0853   CALCIUM 10.0 04/04/2023 1005   GFRNONAA 42 (L) 08/18/2022 0001   GFRNONAA 64 04/29/2016 0853   GFRAA 64 09/16/2020 1206   GFRAA 74 04/29/2016 0853   Lab Results  Component Value Date   HGBA1C 7.0 (H) 02/08/2023   HGBA1C 5.6 07/15/2014   Lab Results  Component Value Date   INSULIN 22.7 03/07/2022   INSULIN 22.9 12/28/2016   Lab Results  Component Value Date   TSH 1.050 01/26/2023   CBC    Component Value Date/Time   WBC 7.8 04/04/2023 1005   WBC 13.1 (H) 08/18/2022 0001   RBC 5.08 04/04/2023 1005   RBC 5.76 08/18/2022 0001   HGB 14.9 04/04/2023 1005   HCT 44.8 04/04/2023 1005   PLT 254 04/04/2023 1005   MCV 88 04/04/2023 1005   MCH 29.3 04/04/2023 1005   MCH 28.1 08/18/2022 0001   MCHC 33.3 04/04/2023 1005   MCHC 33.0 08/18/2022 0001   RDW 14.0 04/04/2023 1005   Iron Studies No results found for: "IRON", "TIBC", "FERRITIN", "IRONPCTSAT" Lipid Panel     Component Value Date/Time   CHOL 113 04/18/2022 1020   TRIG 83 04/18/2022 1020   HDL 38 (L) 04/18/2022 1020   CHOLHDL  3.0 04/18/2022 1020   CHOLHDL 5.2 (H) 01/26/2016 1111   VLDL 46 (H) 01/26/2016 1111   LDLCALC 58 04/18/2022 1020   LDLDIRECT 148 (H) 11/15/2007 1709   Hepatic Function Panel     Component Value Date/Time   PROT 6.0 03/08/2023 0756   ALBUMIN 4.0 03/08/2023 0756   AST 46 (H) 03/08/2023 0756   ALT 97 (H) 03/08/2023 0756   ALKPHOS 64 03/08/2023 0756   BILITOT 0.5 03/08/2023 0756      Component Value Date/Time   TSH 1.050 01/26/2023 1131   Nutritional Lab Results  Component Value Date   VD25OH 63.9 02/08/2023   VD25OH 65.0 09/21/2022   VD25OH 60.9 03/07/2022     Assessment and Plan    Type 2 Diabetes Mellitus Well-controlled with current medications. Blood glucose levels stable, occasional lows (60-80  mg/dL), most readings 440-102 mg/dL. Currently on Ozempic 2 mg weekly, glimepiride, and another unspecified medication. Discussed risk of hypoglycemia with continued weight loss and need to monitor closely. Potential need to adjust or discontinue glimepiride if hypoglycemia becomes more frequent. - Continue Ozempic 2 mg weekly - Monitor blood glucose levels regularly - Adjust glimepiride dose if hypoglycemia becomes more frequent - Follow-up on July 19, 2023  Obesity Recent weight loss of 5 pounds in the last month. Adhering to a category three eating plan 90% of the time and increased physical activity, walking 15-20 minutes most days. Discussed benefits of continued weight loss and maintaining lifestyle changes. - Continue current diet and exercise regimen - Encourage further weight loss and monitor progress  Vitamin D Deficiency Managed with prescription vitamin D 50,000 IU per week. Requests a refill. Discussed importance of maintaining adequate vitamin D levels. - Refill prescription for vitamin D 50,000 IU weekly  General Health Maintenance Discussed Thanksgiving plans and dietary habits. Prefers savory foods over desserts and has strategies to manage holiday eating. Provided a mini pumpkin pie recipe as a healthier dessert option. Discussed importance of maintaining healthy eating habits during the holidays. - Provide anticipatory guidance for holiday eating - Encourage continued healthy eating habits  Follow-up - Follow-up on July 19, 2023 at 10:40 AM - Schedule follow-up on August 16, 2023 at 11:20 AM.        He was informed of the importance of frequent follow up visits to maximize his success with intensive lifestyle modifications for his multiple health conditions.    Quillian Quince, MD

## 2023-07-01 DIAGNOSIS — E119 Type 2 diabetes mellitus without complications: Secondary | ICD-10-CM | POA: Diagnosis not present

## 2023-07-01 DIAGNOSIS — I10 Essential (primary) hypertension: Secondary | ICD-10-CM | POA: Diagnosis not present

## 2023-07-07 ENCOUNTER — Other Ambulatory Visit (INDEPENDENT_AMBULATORY_CARE_PROVIDER_SITE_OTHER): Payer: Self-pay | Admitting: Family Medicine

## 2023-07-07 ENCOUNTER — Other Ambulatory Visit: Payer: Self-pay | Admitting: Student

## 2023-07-07 DIAGNOSIS — E559 Vitamin D deficiency, unspecified: Secondary | ICD-10-CM

## 2023-07-07 DIAGNOSIS — F3289 Other specified depressive episodes: Secondary | ICD-10-CM

## 2023-07-10 ENCOUNTER — Telehealth (INDEPENDENT_AMBULATORY_CARE_PROVIDER_SITE_OTHER): Payer: Self-pay | Admitting: Family Medicine

## 2023-07-10 NOTE — Telephone Encounter (Signed)
12/9 Pt called stating that the pt needed to call the doctor about his Vitamin D prescription but gave him no reason why. Please call pt to follow up. AIR

## 2023-07-11 NOTE — Telephone Encounter (Signed)
Spoke w/ pt, verified that he is taking vitamin D, 1 capsule every 2 weeks per the prescription. He stated that the pharmacy called him and told him there was a problem with the prescription. He may call the pharmacy to verify that he spoke with our office. CS

## 2023-07-11 NOTE — Telephone Encounter (Signed)
#   7, every 14 days sent 06/21/23

## 2023-07-15 DIAGNOSIS — I10 Essential (primary) hypertension: Secondary | ICD-10-CM | POA: Diagnosis not present

## 2023-07-15 DIAGNOSIS — E119 Type 2 diabetes mellitus without complications: Secondary | ICD-10-CM | POA: Diagnosis not present

## 2023-07-19 ENCOUNTER — Ambulatory Visit (INDEPENDENT_AMBULATORY_CARE_PROVIDER_SITE_OTHER): Payer: Medicare HMO | Admitting: Family Medicine

## 2023-07-24 ENCOUNTER — Ambulatory Visit (INDEPENDENT_AMBULATORY_CARE_PROVIDER_SITE_OTHER): Payer: Medicare HMO | Admitting: Family Medicine

## 2023-08-01 DIAGNOSIS — I10 Essential (primary) hypertension: Secondary | ICD-10-CM | POA: Diagnosis not present

## 2023-08-01 DIAGNOSIS — E119 Type 2 diabetes mellitus without complications: Secondary | ICD-10-CM | POA: Diagnosis not present

## 2023-08-03 ENCOUNTER — Ambulatory Visit
Admission: EM | Admit: 2023-08-03 | Discharge: 2023-08-03 | Disposition: A | Discharge status: Home / Self Care | Payer: Medicare HMO | Attending: Family Medicine | Admitting: Family Medicine

## 2023-08-03 ENCOUNTER — Other Ambulatory Visit: Payer: Self-pay | Admitting: Student

## 2023-08-03 ENCOUNTER — Other Ambulatory Visit (INDEPENDENT_AMBULATORY_CARE_PROVIDER_SITE_OTHER): Payer: Self-pay | Admitting: Family Medicine

## 2023-08-03 DIAGNOSIS — I10 Essential (primary) hypertension: Secondary | ICD-10-CM

## 2023-08-03 DIAGNOSIS — F3289 Other specified depressive episodes: Secondary | ICD-10-CM

## 2023-08-03 DIAGNOSIS — E559 Vitamin D deficiency, unspecified: Secondary | ICD-10-CM

## 2023-08-03 DIAGNOSIS — J069 Acute upper respiratory infection, unspecified: Secondary | ICD-10-CM | POA: Diagnosis not present

## 2023-08-03 DIAGNOSIS — E7849 Other hyperlipidemia: Secondary | ICD-10-CM

## 2023-08-03 MED ORDER — PROMETHAZINE-DM 6.25-15 MG/5ML PO SYRP: 5.0000 mL | ORAL_SOLUTION | Freq: Four times a day (QID) | ORAL | 0 refills | Status: DC | PRN

## 2023-08-03 MED ORDER — AMOXICILLIN-POT CLAVULANATE 875-125 MG PO TABS: 1.0000 | ORAL_TABLET | Freq: Two times a day (BID) | ORAL | 0 refills | Status: DC

## 2023-08-03 MED ORDER — PREDNISONE 20 MG PO TABS: 40.0000 mg | ORAL_TABLET | Freq: Every day | ORAL | 0 refills | Status: DC

## 2023-08-03 MED ORDER — AZITHROMYCIN 250 MG PO TABS: ORAL_TABLET | ORAL | 0 refills | Status: DC

## 2023-08-03 NOTE — Discharge Instructions (Addendum)
 I have sent over prednisone and a cough syrup to treat your current symptoms.  If your symptoms are significantly worsening you may start the antibiotic.  Take Flonase, plain Mucinex, Coricidin HBP and other remedies to help your symptoms as well.
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
## 2023-08-03 NOTE — ED Triage Notes (Signed)
 Patient presents to the office for cough , congestion and sinus pressure. Patient reports an productive cough and sore throat.

## 2023-08-16 ENCOUNTER — Encounter (INDEPENDENT_AMBULATORY_CARE_PROVIDER_SITE_OTHER): Payer: Self-pay | Admitting: Family Medicine

## 2023-08-16 ENCOUNTER — Ambulatory Visit (INDEPENDENT_AMBULATORY_CARE_PROVIDER_SITE_OTHER): Payer: Medicare HMO | Admitting: Family Medicine

## 2023-08-16 VITALS — BP 124/70 | HR 72 | Temp 98.1°F | Ht 73.0 in | Wt 295.0 lb

## 2023-08-16 DIAGNOSIS — Z7985 Long-term (current) use of injectable non-insulin antidiabetic drugs: Secondary | ICD-10-CM

## 2023-08-16 DIAGNOSIS — E669 Obesity, unspecified: Secondary | ICD-10-CM | POA: Diagnosis not present

## 2023-08-16 DIAGNOSIS — E119 Type 2 diabetes mellitus without complications: Secondary | ICD-10-CM | POA: Diagnosis not present

## 2023-08-16 DIAGNOSIS — Z6838 Body mass index (BMI) 38.0-38.9, adult: Secondary | ICD-10-CM | POA: Diagnosis not present

## 2023-08-16 DIAGNOSIS — Z7984 Long term (current) use of oral hypoglycemic drugs: Secondary | ICD-10-CM

## 2023-08-16 DIAGNOSIS — E1169 Type 2 diabetes mellitus with other specified complication: Secondary | ICD-10-CM

## 2023-08-16 DIAGNOSIS — E559 Vitamin D deficiency, unspecified: Secondary | ICD-10-CM

## 2023-08-16 DIAGNOSIS — Z6839 Body mass index (BMI) 39.0-39.9, adult: Secondary | ICD-10-CM | POA: Insufficient documentation

## 2023-08-16 MED ORDER — VITAMIN D (ERGOCALCIFEROL) 1.25 MG (50000 UNIT) PO CAPS: 50000.0000 [IU] | ORAL_CAPSULE | ORAL | 0 refills | Status: DC

## 2023-08-16 NOTE — Progress Notes (Signed)
 .smr  Office: [phone]  /  Fax: [phone]  WEIGHT SUMMARY AND BIOMETRICS  Anthropometric Measurements Height: 6\' 1"  (1.854 m) Weight: 295 lb (133.8 kg) BMI (Calculated): 38.93 Weight at Last Visit: 295 lb Weight Lost Since Last Visit: 0 Weight Gained Since Last Visit: 1 lb Starting Weight: 340 lb Total Weight Loss (lbs): 45 lb (20.4 kg) Peak Weight: 353 lb   Body Composition  Body Fat %: 39.7 % Fat Mass (lbs): 117.2 lbs Muscle Mass (lbs): 169.4 lbs Total Body Water  (lbs): 133 lbs Visceral Fat Rating : 27   Other Clinical Data Fasting: No Labs: No Today's Visit #: 103 Starting Date: 12/28/16    Chief Complaint: OBESITY   History of Present Illness   The patient, with a history of vitamin D  deficiency, type two diabetes, and obesity, presents for a routine follow-up. He has been adhering to his prescribed medication regimen, which includes vitamin D  supplements, metformin , and Ozempic . Despite the holiday season, he has only gained one pound and reports adhering to his category three eating plan 90% of the time. He also engages in regular exercise, walking for about 25 minutes three to four times per week.  The patient recently experienced a respiratory illness, which he describes as "the bug" and pneumonia, starting the day after Christmas. He was prescribed an antibiotic, cough syrup, and a steroid, the specifics of which he does not recall. He reports that his symptoms have improved, with only a lingering cough remaining.  In addition to his physical health, the patient is dealing with significant family stress related to legal issues involving a niece. Despite this, he reports managing the stress well, recognizing his limited ability to influence the situation. He has not reported any changes in sleep patterns or any issues with his CPAP machine.  The patient's blood sugar levels have been stable on his current medication regimen. He has not reported any stomach  issues or other side effects from his medications. He has been working with his pharmacy to synchronize his medication refills for convenience.          PHYSICAL EXAM:  Blood pressure 124/70, pulse 72, temperature 98.1 F (36.7 C), height 6\' 1"  (1.854 m), weight 295 lb (133.8 kg), SpO2 96%. Body mass index is 38.92 kg/m.  DIAGNOSTIC DATA REVIEWED:  BMET    Component Value Date/Time   NA 143 04/04/2023 1005   K 4.3 04/04/2023 1005   CL 104 04/04/2023 1005   CO2 27 04/04/2023 1005   GLUCOSE 134 (H) 04/04/2023 1005   GLUCOSE 211 (H) 08/18/2022 0001   BUN 18 04/04/2023 1005   CREATININE 1.15 04/04/2023 1005   CREATININE 1.16 04/29/2016 0853   CALCIUM  10.0 04/04/2023 1005   GFRNONAA 42 (L) 08/18/2022 0001   GFRNONAA 64 04/29/2016 0853   GFRAA 64 09/16/2020 1206   GFRAA 74 04/29/2016 0853   Lab Results  Component Value Date   HGBA1C 7.0 (H) 02/08/2023   HGBA1C 5.6 07/15/2014   Lab Results  Component Value Date   INSULIN  22.7 03/07/2022   INSULIN  22.9 12/28/2016   Lab Results  Component Value Date   TSH 1.050 01/26/2023   CBC    Component Value Date/Time   WBC 7.8 04/04/2023 1005   WBC 13.1 (H) 08/18/2022 0001   RBC 5.08 04/04/2023 1005   RBC 5.76 08/18/2022 0001   HGB 14.9 04/04/2023 1005   HCT 44.8 04/04/2023 1005   PLT 254 04/04/2023 1005   MCV 88 04/04/2023 1005  MCH 29.3 04/04/2023 1005   MCH 28.1 08/18/2022 0001   MCHC 33.3 04/04/2023 1005   MCHC 33.0 08/18/2022 0001   RDW 14.0 04/04/2023 1005   Iron Studies No results found for: "IRON", "TIBC", "FERRITIN", "IRONPCTSAT" Lipid Panel     Component Value Date/Time   CHOL 113 04/18/2022 1020   TRIG 83 04/18/2022 1020   HDL 38 (L) 04/18/2022 1020   CHOLHDL 3.0 04/18/2022 1020   CHOLHDL 5.2 (H) 01/26/2016 1111   VLDL 46 (H) 01/26/2016 1111   LDLCALC 58 04/18/2022 1020   LDLDIRECT 148 (H) 11/15/2007 1709   Hepatic Function Panel     Component Value Date/Time   PROT 6.0 03/08/2023 0756    ALBUMIN 4.0 03/08/2023 0756   AST 46 (H) 03/08/2023 0756   ALT 97 (H) 03/08/2023 0756   ALKPHOS 64 03/08/2023 0756   BILITOT 0.5 03/08/2023 0756      Component Value Date/Time   TSH 1.050 01/26/2023 1131   Nutritional Lab Results  Component Value Date   VD25OH 63.9 02/08/2023   VD25OH 65.0 09/21/2022   VD25OH 60.9 03/07/2022     Assessment and Plan    Type 2 Diabetes Mellitus Type 2 diabetes managed with metformin  and Ozempic . Last hemoglobin A1c was 7.0 in July, improved from 10.9. No reported issues with glycemic control. Currently on 38 clicks of Ozempic  and doing well. - Continue metformin  - Continue Ozempic  at 38 clicks - Order fasting labs at next visit  Obesity Obesity with ongoing efforts in diet and exercise. Gained one pound over the holidays despite steroid use. Following category three eating plan 90% of the time and walking 25 minutes 3-4 times per week. Discussed importance of protein intake on low appetite days. - Continue current diet and exercise regimen - Recommend protein drinks (Premier Protein or Fairlife) on low appetite days  Vitamin D  Deficiency Chronic vitamin D  deficiency managed with prescription vitamin D . Requests refill. - Refill prescription vitamin D , one pill every two weeks  General Health Maintenance Compliant with CPAP usage, no sleep issues. Pharmacy synchronizing all medications for refill. - Ensure all medications are synchronized for refill - Schedule follow-up appointments in February, March, and April.        He was informed of the importance of frequent follow up visits to maximize his success with intensive lifestyle modifications for his multiple health conditions.    Jasmine Mesi, MD

## 2023-08-21 ENCOUNTER — Ambulatory Visit
Admission: EM | Admit: 2023-08-21 | Discharge: 2023-08-21 | Disposition: A | Discharge status: Home / Self Care | Payer: Medicare HMO | Attending: Family Medicine | Admitting: Family Medicine

## 2023-08-21 DIAGNOSIS — J22 Unspecified acute lower respiratory infection: Secondary | ICD-10-CM | POA: Diagnosis not present

## 2023-08-21 DIAGNOSIS — R0602 Shortness of breath: Secondary | ICD-10-CM | POA: Diagnosis not present

## 2023-08-21 DIAGNOSIS — R062 Wheezing: Secondary | ICD-10-CM | POA: Diagnosis not present

## 2023-08-21 MED ORDER — IPRATROPIUM-ALBUTEROL 0.5-2.5 (3) MG/3ML IN SOLN
3.0000 mL | Freq: Once | RESPIRATORY_TRACT | Status: AC
  Administered 2023-08-21: 3 mL via RESPIRATORY_TRACT

## 2023-08-21 MED ORDER — ALBUTEROL SULFATE HFA 108 (90 BASE) MCG/ACT IN AERS: 2.0000 | INHALATION_SPRAY | RESPIRATORY_TRACT | 0 refills | Status: DC | PRN

## 2023-08-21 MED ORDER — DOXYCYCLINE HYCLATE 100 MG PO CAPS: 100.0000 mg | ORAL_CAPSULE | Freq: Two times a day (BID) | ORAL | 0 refills | Status: DC

## 2023-08-21 MED ORDER — PREDNISONE 20 MG PO TABS: 40.0000 mg | ORAL_TABLET | Freq: Every day | ORAL | 0 refills | Status: DC

## 2023-08-21 MED ORDER — BENZONATATE 100 MG PO CAPS: 100.0000 mg | ORAL_CAPSULE | Freq: Three times a day (TID) | ORAL | 0 refills | Status: DC
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
## 2023-08-21 NOTE — ED Triage Notes (Signed)
Cough, congestion, SOB with exertion x 3.5 weeks. Pt came on 08/03/23 and was prescribed antibiotics and cough syrup and pt states the cough syrup helped but still having cough and congestion that wont go away.

## 2023-09-06 ENCOUNTER — Other Ambulatory Visit: Payer: Self-pay | Admitting: Student

## 2023-09-06 DIAGNOSIS — F3289 Other specified depressive episodes: Secondary | ICD-10-CM

## 2023-09-06 DIAGNOSIS — E7849 Other hyperlipidemia: Secondary | ICD-10-CM

## 2023-09-07 ENCOUNTER — Other Ambulatory Visit: Payer: Self-pay | Admitting: Student

## 2023-09-07 DIAGNOSIS — E7849 Other hyperlipidemia: Secondary | ICD-10-CM

## 2023-09-07 DIAGNOSIS — E1169 Type 2 diabetes mellitus with other specified complication: Secondary | ICD-10-CM

## 2023-09-08 ENCOUNTER — Other Ambulatory Visit: Payer: Self-pay | Admitting: Student

## 2023-09-08 DIAGNOSIS — E7849 Other hyperlipidemia: Secondary | ICD-10-CM

## 2023-09-08 MED ORDER — ROSUVASTATIN CALCIUM 40 MG PO TABS: ORAL_TABLET | ORAL | 3 refills | Status: DC

## 2023-09-08 NOTE — Progress Notes (Signed)
Refilled Med

## 2023-09-11 LAB — HM DIABETES EYE EXAM

## 2023-09-19 ENCOUNTER — Ambulatory Visit (INDEPENDENT_AMBULATORY_CARE_PROVIDER_SITE_OTHER): Payer: Medicare HMO | Admitting: Family Medicine

## 2023-09-26 ENCOUNTER — Other Ambulatory Visit: Payer: Self-pay | Admitting: Student

## 2023-09-26 ENCOUNTER — Other Ambulatory Visit: Payer: Self-pay | Admitting: Physician Assistant

## 2023-09-26 DIAGNOSIS — E1159 Type 2 diabetes mellitus with other circulatory complications: Secondary | ICD-10-CM

## 2023-09-26 DIAGNOSIS — F3289 Other specified depressive episodes: Secondary | ICD-10-CM

## 2023-09-28 NOTE — Patient Instructions (Signed)
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
## 2023-10-04 ENCOUNTER — Ambulatory Visit: Payer: Medicare PPO | Admitting: Family Medicine

## 2023-10-04 ENCOUNTER — Telehealth: Payer: Self-pay

## 2023-10-04 ENCOUNTER — Encounter: Payer: Self-pay | Admitting: Family Medicine

## 2023-10-04 VITALS — BP 128/80 | HR 71 | Ht 73.0 in | Wt 311.0 lb

## 2023-10-04 DIAGNOSIS — G4733 Obstructive sleep apnea (adult) (pediatric): Secondary | ICD-10-CM | POA: Diagnosis not present

## 2023-10-04 NOTE — Telephone Encounter (Signed)
 Order placed to [facility] to order cpap:

## 2023-10-09 ENCOUNTER — Other Ambulatory Visit: Payer: Self-pay | Admitting: Student

## 2023-10-09 DIAGNOSIS — F3289 Other specified depressive episodes: Secondary | ICD-10-CM

## 2023-10-16 ENCOUNTER — Ambulatory Visit: Payer: Medicare HMO | Admitting: Cardiovascular Disease

## 2023-10-17 ENCOUNTER — Ambulatory Visit (INDEPENDENT_AMBULATORY_CARE_PROVIDER_SITE_OTHER): Payer: Medicare HMO | Admitting: Family Medicine

## 2023-10-17 ENCOUNTER — Encounter (INDEPENDENT_AMBULATORY_CARE_PROVIDER_SITE_OTHER): Payer: Self-pay | Admitting: Family Medicine

## 2023-10-17 VITALS — BP 129/71 | HR 64 | Temp 97.6°F | Ht 73.0 in | Wt 302.0 lb

## 2023-10-17 DIAGNOSIS — E559 Vitamin D deficiency, unspecified: Secondary | ICD-10-CM | POA: Diagnosis not present

## 2023-10-17 DIAGNOSIS — Z6839 Body mass index (BMI) 39.0-39.9, adult: Secondary | ICD-10-CM | POA: Diagnosis not present

## 2023-10-17 DIAGNOSIS — Z7985 Long-term (current) use of injectable non-insulin antidiabetic drugs: Secondary | ICD-10-CM

## 2023-10-17 DIAGNOSIS — E1169 Type 2 diabetes mellitus with other specified complication: Secondary | ICD-10-CM

## 2023-10-17 DIAGNOSIS — E119 Type 2 diabetes mellitus without complications: Secondary | ICD-10-CM

## 2023-10-17 DIAGNOSIS — E669 Obesity, unspecified: Secondary | ICD-10-CM

## 2023-10-17 DIAGNOSIS — J189 Pneumonia, unspecified organism: Secondary | ICD-10-CM

## 2023-10-17 DIAGNOSIS — Z7984 Long term (current) use of oral hypoglycemic drugs: Secondary | ICD-10-CM

## 2023-10-17 MED ORDER — VITAMIN D (ERGOCALCIFEROL) 1.25 MG (50000 UNIT) PO CAPS: 50000.0000 [IU] | ORAL_CAPSULE | ORAL | 0 refills | Status: DC

## 2023-10-17 NOTE — Progress Notes (Signed)
 Office: [phone]  /  Fax: [phone]  WEIGHT SUMMARY AND BIOMETRICS  Anthropometric Measurements Height: 6\' 1"  (1.854 m) Weight: (!) 302 lb (137 kg) BMI (Calculated): 39.85 Weight at Last Visit: 195 lb Weight Lost Since Last Visit: 0 Weight Gained Since Last Visit: 7 lb Starting Weight: 340 lb Total Weight Loss (lbs): 38 lb (17.2 kg)   Body Composition  Body Fat %: 40.5 % Fat Mass (lbs): 122.6 lbs Muscle Mass (lbs): 171.2 lbs Total Body Water (lbs): 141.6 lbs Visceral Fat Rating : 28   Other Clinical Data Fasting: No Labs: No Today's Visit #: 104 Starting Date: 12/28/16    Chief Complaint: OBESITY  History of Present Illness   [name] [name] "[name]" is a 78 year old male with obesity and type two diabetes who presents with weight gain and recent illness.  He experienced a significant illness over the past couple of months, including pneumonia and influenza, leading to a period of inactivity lasting approximately thirty-five to forty days. During this time, he was treated with prednisone twice and other medications, which he believes contributed to fluid retention and weight gain. He was 'totally inactive' due to feeling unwell and experienced severe coughing, which was treated with cough syrup and 'little yellow pearls', providing some relief. The coughing was so intense that it was difficult to breathe and caused significant discomfort. He is currently about ninety percent back to normal and has resumed physical activities, including helping with outdoor work at his church.  He reports a weight gain of seven pounds since his last visit despite following his category three eating plan about seventy-five percent of the time. He has been engaging in physical activity by doing yard work four to five times a week. He attributes some of the weight gain to fluid retention from the medications used during his recent illness.  He is on metformin and Ozempic for his type  two diabetes, with no reported issues with these medications. He uses the Ozempic pen set to the 'one' dose. He did not monitor his blood sugars during his illness and reports no lab work was done during his pneumonia episodes. He completed his last dose of prednisone about a week and a half ago and is no longer on antibiotics.  He has a history of vitamin D deficiency and is currently on prescription vitamin D, requesting a refill during this visit.          PHYSICAL EXAM:  Blood pressure 129/71, pulse 64, temperature 97.6 F (36.4 C), height 6\' 1"  (1.854 m), weight (!) 302 lb (137 kg), SpO2 96%. Body mass index is 39.84 kg/m.  DIAGNOSTIC DATA REVIEWED:  BMET    Component Value Date/Time   NA 143 04/04/2023 1005   K 4.3 04/04/2023 1005   CL 104 04/04/2023 1005   CO2 27 04/04/2023 1005   GLUCOSE 134 (H) 04/04/2023 1005   GLUCOSE 211 (H) 08/18/2022 0001   BUN 18 04/04/2023 1005   CREATININE 1.15 04/04/2023 1005   CREATININE 1.16 04/29/2016 0853   CALCIUM 10.0 04/04/2023 1005   GFRNONAA 42 (L) 08/18/2022 0001   GFRNONAA 64 04/29/2016 0853   GFRAA 64 09/16/2020 1206   GFRAA 74 04/29/2016 0853   Lab Results  Component Value Date   HGBA1C 7.0 (H) 02/08/2023   HGBA1C 5.6 07/15/2014   Lab Results  Component Value Date   INSULIN 22.7 03/07/2022   INSULIN 22.9 12/28/2016   Lab Results  Component Value Date   TSH 1.050  01/26/2023   CBC    Component Value Date/Time   WBC 7.8 04/04/2023 1005   WBC 13.1 (H) 08/18/2022 0001   RBC 5.08 04/04/2023 1005   RBC 5.76 08/18/2022 0001   HGB 14.9 04/04/2023 1005   HCT 44.8 04/04/2023 1005   PLT 254 04/04/2023 1005   MCV 88 04/04/2023 1005   MCH 29.3 04/04/2023 1005   MCH 28.1 08/18/2022 0001   MCHC 33.3 04/04/2023 1005   MCHC 33.0 08/18/2022 0001   RDW 14.0 04/04/2023 1005   Iron Studies No results found for: "IRON", "TIBC", "FERRITIN", "IRONPCTSAT" Lipid Panel     Component Value Date/Time   CHOL 113 04/18/2022 1020    TRIG 83 04/18/2022 1020   HDL 38 (L) 04/18/2022 1020   CHOLHDL 3.0 04/18/2022 1020   CHOLHDL 5.2 (H) 01/26/2016 1111   VLDL 46 (H) 01/26/2016 1111   LDLCALC 58 04/18/2022 1020   LDLDIRECT 148 (H) 11/15/2007 1709   Hepatic Function Panel     Component Value Date/Time   PROT 6.0 03/08/2023 0756   ALBUMIN 4.0 03/08/2023 0756   AST 46 (H) 03/08/2023 0756   ALT 97 (H) 03/08/2023 0756   ALKPHOS 64 03/08/2023 0756   BILITOT 0.5 03/08/2023 0756      Component Value Date/Time   TSH 1.050 01/26/2023 1131   Nutritional Lab Results  Component Value Date   VD25OH 63.9 02/08/2023   VD25OH 65.0 09/21/2022   VD25OH 60.9 03/07/2022     Assessment and Plan    Pneumonia Recently experienced severe pneumonia and influenza, requiring two courses of prednisone and other medications. Inactive for 35-40 days due to illness. Currently approximately 90% recovered and he is working on getting back to his eating plan.  Type 2 Diabetes Mellitus Blood glucose levels potentially affected by recent steroid use. Did not monitor blood glucose during illness. Lab work deferred to next visit to allow prednisone clearance. - Continue metformin - Continue Ozempic at current dose - Delay lab work until next visit  Obesity Following category three eating plan 75% of the time but gained seven pounds since last visit. Engaged in yard work 4-5 times a week. Recent illness and steroid use contributed to weight gain. Emphasized exercise importance and suggested incorporating 10 minutes of walking on nice days. - Continue category three eating plan - Incorporate 10+ minutes of walking on nice days  Vitamin D Deficiency On prescription vitamin D. Requested refill during visit. Stable - Refill prescription vitamin D  General Health Maintenance Encouraged to continue physical activity and dietary adherence. - Encourage physical activity - Adhere to category three eating plan  Follow-up Follow-up appointment  scheduled. - Follow up in four weeks on November 14, 2023, at 7:40 AM         He was informed of the importance of frequent follow up visits to maximize his success with intensive lifestyle modifications for his multiple health conditions.    Quillian Quince, MD

## 2023-10-29 ENCOUNTER — Other Ambulatory Visit: Payer: Self-pay | Admitting: Student

## 2023-10-29 DIAGNOSIS — E1159 Type 2 diabetes mellitus with other circulatory complications: Secondary | ICD-10-CM

## 2023-10-29 DIAGNOSIS — I10 Essential (primary) hypertension: Secondary | ICD-10-CM

## 2023-10-29 DIAGNOSIS — E7849 Other hyperlipidemia: Secondary | ICD-10-CM

## 2023-10-31 DIAGNOSIS — H16211 Exposure keratoconjunctivitis, right eye: Secondary | ICD-10-CM | POA: Diagnosis not present

## 2023-10-31 DIAGNOSIS — H02112 Cicatricial ectropion of right lower eyelid: Secondary | ICD-10-CM | POA: Diagnosis not present

## 2023-10-31 DIAGNOSIS — H02115 Cicatricial ectropion of left lower eyelid: Secondary | ICD-10-CM | POA: Diagnosis not present

## 2023-10-31 DIAGNOSIS — H0279 Other degenerative disorders of eyelid and periocular area: Secondary | ICD-10-CM | POA: Diagnosis not present

## 2023-10-31 DIAGNOSIS — H02132 Senile ectropion of right lower eyelid: Secondary | ICD-10-CM | POA: Diagnosis not present

## 2023-10-31 DIAGNOSIS — H16212 Exposure keratoconjunctivitis, left eye: Secondary | ICD-10-CM | POA: Diagnosis not present

## 2023-10-31 DIAGNOSIS — H02142 Spastic ectropion of right lower eyelid: Secondary | ICD-10-CM | POA: Diagnosis not present

## 2023-10-31 DIAGNOSIS — H02145 Spastic ectropion of left lower eyelid: Secondary | ICD-10-CM | POA: Diagnosis not present

## 2023-10-31 DIAGNOSIS — H04123 Dry eye syndrome of bilateral lacrimal glands: Secondary | ICD-10-CM | POA: Diagnosis not present

## 2023-11-01 ENCOUNTER — Other Ambulatory Visit: Payer: Self-pay | Admitting: Student

## 2023-11-01 DIAGNOSIS — F3289 Other specified depressive episodes: Secondary | ICD-10-CM

## 2023-11-01 DIAGNOSIS — I10 Essential (primary) hypertension: Secondary | ICD-10-CM

## 2023-11-14 ENCOUNTER — Encounter (INDEPENDENT_AMBULATORY_CARE_PROVIDER_SITE_OTHER): Payer: Self-pay | Admitting: Family Medicine

## 2023-11-14 ENCOUNTER — Ambulatory Visit (INDEPENDENT_AMBULATORY_CARE_PROVIDER_SITE_OTHER): Payer: Medicare HMO | Admitting: Family Medicine

## 2023-11-14 VITALS — BP 112/56 | HR 68 | Temp 97.5°F | Ht 73.0 in | Wt 302.0 lb

## 2023-11-14 DIAGNOSIS — E7849 Other hyperlipidemia: Secondary | ICD-10-CM | POA: Diagnosis not present

## 2023-11-14 DIAGNOSIS — Z6839 Body mass index (BMI) 39.0-39.9, adult: Secondary | ICD-10-CM | POA: Diagnosis not present

## 2023-11-14 DIAGNOSIS — E559 Vitamin D deficiency, unspecified: Secondary | ICD-10-CM

## 2023-11-14 DIAGNOSIS — I10 Essential (primary) hypertension: Secondary | ICD-10-CM | POA: Diagnosis not present

## 2023-11-14 DIAGNOSIS — E785 Hyperlipidemia, unspecified: Secondary | ICD-10-CM

## 2023-11-14 DIAGNOSIS — I5030 Unspecified diastolic (congestive) heart failure: Secondary | ICD-10-CM | POA: Diagnosis not present

## 2023-11-14 DIAGNOSIS — E1169 Type 2 diabetes mellitus with other specified complication: Secondary | ICD-10-CM

## 2023-11-14 DIAGNOSIS — Z7985 Long-term (current) use of injectable non-insulin antidiabetic drugs: Secondary | ICD-10-CM | POA: Diagnosis not present

## 2023-11-14 DIAGNOSIS — E669 Obesity, unspecified: Secondary | ICD-10-CM

## 2023-11-14 DIAGNOSIS — I77810 Thoracic aortic ectasia: Secondary | ICD-10-CM | POA: Diagnosis not present

## 2023-11-14 DIAGNOSIS — H34219 Partial retinal artery occlusion, unspecified eye: Secondary | ICD-10-CM | POA: Diagnosis not present

## 2023-11-14 DIAGNOSIS — Z7984 Long term (current) use of oral hypoglycemic drugs: Secondary | ICD-10-CM

## 2023-11-14 DIAGNOSIS — I639 Cerebral infarction, unspecified: Secondary | ICD-10-CM | POA: Diagnosis not present

## 2023-11-14 DIAGNOSIS — E119 Type 2 diabetes mellitus without complications: Secondary | ICD-10-CM

## 2023-11-14 DIAGNOSIS — E538 Deficiency of other specified B group vitamins: Secondary | ICD-10-CM | POA: Diagnosis not present

## 2023-11-14 DIAGNOSIS — I6522 Occlusion and stenosis of left carotid artery: Secondary | ICD-10-CM | POA: Diagnosis not present

## 2023-11-14 DIAGNOSIS — I2510 Atherosclerotic heart disease of native coronary artery without angina pectoris: Secondary | ICD-10-CM | POA: Diagnosis not present

## 2023-11-14 NOTE — Progress Notes (Signed)
 Office: [phone]  /  Fax: [phone]  WEIGHT SUMMARY AND BIOMETRICS  Anthropometric Measurements Height: 6\' 1"  (1.854 m) Weight: (!) 302 lb (137 kg) BMI (Calculated): 39.85 Weight at Last Visit: 302lb Weight Lost Since Last Visit: 0lb Weight Gained Since Last Visit: 0lb Starting Weight: 340lb Total Weight Loss (lbs): 38 lb (17.2 kg)   Body Composition  Body Fat %: 40.3 % Fat Mass (lbs): 122 lbs Muscle Mass (lbs): 171.8 lbs Total Body Water (lbs): 138.6 lbs Visceral Fat Rating : 28   Other Clinical Data Fasting: No Labs: no Today's Visit #: 105 Starting Date: 12/28/16    Chief Complaint: OBESITY   History of Present Illness [name] [name] "[name]" is a 78 year old male with obesity and type 2 diabetes who presents for obesity treatment and progress assessment.  He adheres to a category three eating plan approximately 90% of the time and engages in physical activity by walking for 20 minutes, five times per week. He has maintained his weight over the past month.  He manages his type 2 diabetes with diet, exercise, and weight loss, and is currently taking Ozempic, metformin, and glimepiride. He has been on glimepiride for an unspecified duration. His hunger is manageable, although he experienced increased hunger while on steroids previously.  He is scheduled for eye surgery in early June to correct an issue with his lower eyelid, which has been problematic for nearly four years. The condition causes discomfort and vision obstruction due to debris in the eye, affecting activities such as riding a motorcycle and driving.  He uses a CPAP machine for sleep apnea and experiences marks on his face from the CPAP mask, which disappear after some time.  He takes vitamin D supplements every other week. He has encountered issues with medication costs, particularly with Gemtesa, which was quoted at $1600 for a 90-day supply, leading him to decline the prescription. He is also  managing the cost of Ozempic, which is affected by insurance coverage and the Medicare donut hole.      PHYSICAL EXAM:  Blood pressure (!) 112/56, pulse 68, temperature (!) 97.5 F (36.4 C), height 6\' 1"  (1.854 m), weight (!) 302 lb (137 kg), SpO2 96%. Body mass index is 39.84 kg/m.  DIAGNOSTIC DATA REVIEWED:  BMET    Component Value Date/Time   NA 143 04/04/2023 1005   K 4.3 04/04/2023 1005   CL 104 04/04/2023 1005   CO2 27 04/04/2023 1005   GLUCOSE 134 (H) 04/04/2023 1005   GLUCOSE 211 (H) 08/18/2022 0001   BUN 18 04/04/2023 1005   CREATININE 1.15 04/04/2023 1005   CREATININE 1.16 04/29/2016 0853   CALCIUM 10.0 04/04/2023 1005   GFRNONAA 42 (L) 08/18/2022 0001   GFRNONAA 64 04/29/2016 0853   GFRAA 64 09/16/2020 1206   GFRAA 74 04/29/2016 0853   Lab Results  Component Value Date   HGBA1C 7.0 (H) 02/08/2023   HGBA1C 5.6 07/15/2014   Lab Results  Component Value Date   INSULIN 22.7 03/07/2022   INSULIN 22.9 12/28/2016   Lab Results  Component Value Date   TSH 1.050 01/26/2023   CBC    Component Value Date/Time   WBC 7.8 04/04/2023 1005   WBC 13.1 (H) 08/18/2022 0001   RBC 5.08 04/04/2023 1005   RBC 5.76 08/18/2022 0001   HGB 14.9 04/04/2023 1005   HCT 44.8 04/04/2023 1005   PLT 254 04/04/2023 1005   MCV 88 04/04/2023 1005   MCH 29.3 04/04/2023 1005  MCH 28.1 08/18/2022 0001   MCHC 33.3 04/04/2023 1005   MCHC 33.0 08/18/2022 0001   RDW 14.0 04/04/2023 1005   Iron Studies No results found for: "IRON", "TIBC", "FERRITIN", "IRONPCTSAT" Lipid Panel     Component Value Date/Time   CHOL 113 04/18/2022 1020   TRIG 83 04/18/2022 1020   HDL 38 (L) 04/18/2022 1020   CHOLHDL 3.0 04/18/2022 1020   CHOLHDL 5.2 (H) 01/26/2016 1111   VLDL 46 (H) 01/26/2016 1111   LDLCALC 58 04/18/2022 1020   LDLDIRECT 148 (H) 11/15/2007 1709   Hepatic Function Panel     Component Value Date/Time   PROT 6.0 03/08/2023 0756   ALBUMIN 4.0 03/08/2023 0756   AST 46 (H)  03/08/2023 0756   ALT 97 (H) 03/08/2023 0756   ALKPHOS 64 03/08/2023 0756   BILITOT 0.5 03/08/2023 0756      Component Value Date/Time   TSH 1.050 01/26/2023 1131   Nutritional Lab Results  Component Value Date   VD25OH 63.9 02/08/2023   VD25OH 65.0 09/21/2022   VD25OH 60.9 03/07/2022     Assessment and Plan Assessment & Plan Type 2 Diabetes Mellitus He manages diabetes with diet, exercise, and medications including Ozempic, metformin, and glimepiride. Glimepiride is weight gaining and would be the first medication to reduce if diabetes is well controlled. A1c will be checked to assess control.  - Check A1c - Continue Ozempic, metformin, and glimepiride - Continue diet, exercise and weight loss efforts   Obesity He follows a category three eating plan about 90% of the time and exercises by walking 20 minutes five times per week. He has maintained his weight over the last month. The focus is on weight loss to aid in the management of type 2 diabetes. - Continue category three eating plan - Continue exercise regimen of walking 20 minutes five times per week  Hypertension Hypertension is monitored with regular blood work. BP controlled todayElectrolytes will be checked to assess control. - Check electrolytes - Continue diet, exercise and weight loss efforts   Hyperlipidemia Hyperlipidemia is monitored with regular blood work. Cholesterol levels will be checked to assess control. - Check cholesterol levels - Continue diet, exercise and weight loss efforts   Vitamin D Deficiency He takes vitamin D supplements every other week. Vitamin D levels will be checked to assess adequacy of supplementation. - Check vitamin D levels  Follow-up He has his next appointment scheduled and will set up an additional appointment for June. - Schedule follow-up appointment for June      He was informed of the importance of frequent follow up visits to maximize his success with intensive  lifestyle modifications for his multiple health conditions.    Jasmine Mesi, MD

## 2023-11-15 LAB — CMP14+EGFR
ALT: 45 IU/L — ABNORMAL HIGH (ref 0–44)
AST: 24 IU/L (ref 0–40)
Albumin: 4.3 g/dL (ref 3.8–4.8)
Alkaline Phosphatase: 63 IU/L (ref 44–121)
BUN/Creatinine Ratio: 16 (ref 10–24)
BUN: 21 mg/dL (ref 8–27)
Bilirubin Total: 0.6 mg/dL (ref 0.0–1.2)
CO2: 22 mmol/L (ref 20–29)
Calcium: 9.3 mg/dL (ref 8.6–10.2)
Chloride: 101 mmol/L (ref 96–106)
Creatinine, Ser: 1.34 mg/dL — ABNORMAL HIGH (ref 0.76–1.27)
Globulin, Total: 1.8 g/dL (ref 1.5–4.5)
Glucose: 151 mg/dL — ABNORMAL HIGH (ref 70–99)
Potassium: 4.1 mmol/L (ref 3.5–5.2)
Sodium: 140 mmol/L (ref 134–144)
Total Protein: 6.1 g/dL (ref 6.0–8.5)
eGFR: 55 mL/min/{1.73_m2} — ABNORMAL LOW (ref >59)

## 2023-11-15 LAB — CBC WITH DIFFERENTIAL/PLATELET
Basophils Absolute: 0.1 10*3/uL (ref 0.0–0.2)
Basos: 1 %
EOS (ABSOLUTE): 0.8 10*3/uL — ABNORMAL HIGH (ref 0.0–0.4)
Eos: 11 %
Hematocrit: 44.2 % (ref 37.5–51.0)
Hemoglobin: 14.4 g/dL (ref 13.0–17.7)
Immature Grans (Abs): 0 10*3/uL (ref 0.0–0.1)
Immature Granulocytes: 0 %
Lymphocytes Absolute: 1.1 10*3/uL (ref 0.7–3.1)
Lymphs: 15 %
MCH: 29.3 pg (ref 26.6–33.0)
MCHC: 32.6 g/dL (ref 31.5–35.7)
MCV: 90 fL (ref 79–97)
Monocytes Absolute: 0.5 10*3/uL (ref 0.1–0.9)
Monocytes: 7 %
Neutrophils Absolute: 4.9 10*3/uL (ref 1.4–7.0)
Neutrophils: 66 %
Platelets: 268 10*3/uL (ref 150–450)
RBC: 4.92 x10E6/uL (ref 4.14–5.80)
RDW: 13.9 % (ref 11.6–15.4)
WBC: 7.4 10*3/uL (ref 3.4–10.8)

## 2023-11-15 LAB — LIPID PANEL WITH LDL/HDL RATIO
Cholesterol, Total: 95 mg/dL — ABNORMAL LOW (ref 100–199)
HDL: 37 mg/dL — ABNORMAL LOW (ref >39)
LDL Chol Calc (NIH): 40 mg/dL (ref 0–99)
LDL/HDL Ratio: 1.1 ratio (ref 0.0–3.6)
Triglycerides: 92 mg/dL (ref 0–149)
VLDL Cholesterol Cal: 18 mg/dL (ref 5–40)

## 2023-11-15 LAB — FOLATE: Folate: 18.6 ng/mL (ref >3.0)

## 2023-11-15 LAB — HEMOGLOBIN A1C
Est. average glucose Bld gHb Est-mCnc: 174 mg/dL
Hgb A1c MFr Bld: 7.7 % — ABNORMAL HIGH (ref 4.8–5.6)

## 2023-11-15 LAB — TSH: TSH: 1.43 u[IU]/mL (ref 0.450–4.500)

## 2023-11-15 LAB — INSULIN, RANDOM: INSULIN: 13.9 u[IU]/mL (ref 2.6–24.9)

## 2023-11-15 LAB — VITAMIN B12: Vitamin B-12: 611 pg/mL (ref 232–1245)

## 2023-11-15 LAB — VITAMIN D 25 HYDROXY (VIT D DEFICIENCY, FRACTURES): Vit D, 25-Hydroxy: 68.2 ng/mL (ref 30.0–100.0)

## 2023-11-22 ENCOUNTER — Ambulatory Visit: Payer: Medicare HMO | Attending: Cardiovascular Disease | Admitting: Cardiovascular Disease

## 2023-11-22 ENCOUNTER — Encounter: Payer: Self-pay | Admitting: Cardiovascular Disease

## 2023-11-22 VITALS — BP 128/70 | HR 105 | Ht 73.0 in | Wt 305.0 lb

## 2023-11-22 DIAGNOSIS — N1831 Chronic kidney disease, stage 3a: Secondary | ICD-10-CM | POA: Diagnosis not present

## 2023-11-22 DIAGNOSIS — I5032 Chronic diastolic (congestive) heart failure: Secondary | ICD-10-CM | POA: Diagnosis not present

## 2023-11-22 DIAGNOSIS — G4733 Obstructive sleep apnea (adult) (pediatric): Secondary | ICD-10-CM

## 2023-11-22 DIAGNOSIS — E785 Hyperlipidemia, unspecified: Secondary | ICD-10-CM

## 2023-11-22 DIAGNOSIS — E1159 Type 2 diabetes mellitus with other circulatory complications: Secondary | ICD-10-CM | POA: Diagnosis not present

## 2023-11-22 DIAGNOSIS — I25118 Atherosclerotic heart disease of native coronary artery with other forms of angina pectoris: Secondary | ICD-10-CM

## 2023-11-22 DIAGNOSIS — Z9889 Other specified postprocedural states: Secondary | ICD-10-CM

## 2023-11-22 MED ORDER — FUROSEMIDE 40 MG PO TABS: 40.0000 mg | ORAL_TABLET | Freq: Every day | ORAL | 3 refills | Status: DC | PRN

## 2023-11-22 MED ORDER — EMPAGLIFLOZIN 10 MG PO TABS: 10.0000 mg | ORAL_TABLET | Freq: Every day | ORAL | 3 refills | Status: AC

## 2023-11-22 NOTE — Patient Instructions (Addendum)
 Medication Instructions:  Your physician has recommended you make the following change in your medication:  STOP: Aspirin   CHANGE: Lasix  40 mg daily as needed START: Jardiance  10 mg once daily   *If you need a refill on your cardiac medications before your next appointment, please call your pharmacy*  Follow-Up: At [facility], you and your health needs are our priority.  As part of our continuing mission to provide you with exceptional heart care, our providers are all part of one team.  This team includes your primary Cardiologist (physician) and Advanced Practice Providers or APPs (Physician Assistants and Nurse Practitioners) who all work together to provide you with the care you need, when you need it.  Your next appointment:   1 year(s)  Provider:   Luana Rumple, MD  We recommend signing up for the patient portal called "MyChart".  Sign up information is provided on this After Visit Summary.  MyChart is used to connect with patients for Virtual Visits (Telemedicine).  Patients are able to view lab/test results, encounter notes, upcoming appointments, etc.  Non-urgent messages can be sent to your provider as well.   To learn more about what you can do with MyChart, go to [email or web address].   Other Instructions       1st Floor: - Lobby - Registration  - Pharmacy  - Lab - Cafe  2nd Floor: - PV Lab - Diagnostic Testing (echo, CT, nuclear med)  3rd Floor: - Vacant  4th Floor: - TCTS (cardiothoracic surgery) - AFib Clinic - Structural Heart Clinic - Vascular Surgery  - Vascular Ultrasound  5th Floor: - [facility] Cardiology (general and EP) - Clinical Pharmacy for coumadin, hypertension, lipid, weight-loss medications, and med management appointments    Valet parking services will be available as well.

## 2023-11-22 NOTE — Progress Notes (Signed)
 Cardiology Office Note:  .   Date:  11/22/2023  ID:  [name] [name], DOB [birth year], MRN [id number] PCP: Goble Last, MD  [facility] Providers Cardiologist:  Avery Bodo, MD    History of Present Illness: .   [name] [name] is a 78 y.o. male with HFPEF (EF 55-60% echo Aug 2024), CAD (CTO of RCA, DES-LAD 2023, patent at cath Sept 2024), s/p L CEA (2018), HTN, HLP, DM2, RBBB, OSA, GERD, prostate CA, borderline dilation of the ascending aorta (40 mm). Transition of care from Dr. Jacquelynn Matter.  He is generally done well from a cardiovascular standpoint since his last appointment.  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  Does not have problems with shortness of breath with usual activity.  Denies lower extremity edema except sometimes at the end of the day.  Has not had chest pain at rest or with activity.  Denies palpitations, dizziness, syncope.  Did have a fall in his house when he slipped on a rug.  Even independent of the fall he has had very easy bruising on his abdomen, chest, extremities.  Had a normal platelet count recently.  Lipid parameters are generally good with a LDL cholesterol of 40 and normal triglycerides, but with a chronically low HDL at 37.  Mediocre control of diabetes with hemoglobin A1c 7.7%.  Mildly abnormal renal function with a creatinine of 1.34 (GFR 55), waxing and waning around this average range over the last 5 years.  Normal left ventricular systolic function, but had evidence of elevated LVEDP at his cardiac catheterization last September.  Studies Reviewed: Aaron Aas      Personally reviewed ECG from 04/21/2023 which shows sinus rhythm with a single PVC, RBBB plus LAFB.  CARDIAC CATH 04/07/2023   Dist LM lesion is 25% stenosed.   Ost LAD to Prox LAD lesion is 25% stenosed.   Prox RCA lesion is 100% stenosed.   Mid LAD lesion is 30% stenosed.   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 The RCA is known to be occluded and fills from collaterals from the LAD. The RCA has never been engaged on prior caths (2001 and 2023). I did not attempt to engage the RCA today LVEDP=20-22 mmHg  ECHO 03/27/2023  1. Left ventricular ejection fraction, by estimation, is 55 to 60%. The  left ventricle has normal function. The left ventricle has no regional  wall motion abnormalities. There is moderate left ventricular hypertrophy.  Left ventricular diastolic  parameters are consistent with Grade I diastolic dysfunction (impaired  relaxation).   2. Right ventricular systolic function is normal. The right ventricular  size is normal.   3. The mitral valve is normal in structure. Trivial mitral valve  regurgitation. No evidence of mitral stenosis.   4. The aortic valve is tricuspid. There is mild calcification of the  aortic valve. There is mild thickening of the aortic valve. Aortic valve  regurgitation is not visualized. No aortic stenosis is present.   5. Aortic dilatation noted. There is mild dilatation of the ascending  aorta, measuring 40 mm.   6. The inferior vena cava is normal in size with greater than 50%  respiratory variability, suggesting right atrial pressure of 3 mmHg.   Comparison(s): EF 55-60%. Normal wall motion. Mild aortic dilatation  noted. Ascending Aorta 40 mm. Trivial aortic regurgitation.    Risk Assessment/Calculations:  Physical Exam:   VS:  BP 128/70   Pulse (!) 105   Ht 6\' 1"  (1.854 m)   Wt (!) 305 lb (138.3 kg)   SpO2 95%   BMI 40.24 kg/m    Wt Readings from Last 3 Encounters:  11/22/23 (!) 305 lb (138.3 kg)  11/14/23 (!) 302 lb (137 kg)  10/17/23 (!) 302 lb (137 kg)    GEN: Well nourished, well developed in no acute distress, morbidly obese NECK: No JVD; No carotid bruits CARDIAC: RRR, no murmurs, rubs, gallops RESPIRATORY:  Clear to auscultation without rales, wheezing or rhonchi   ABDOMEN: Soft, non-tender, non-distended EXTREMITIES:  No edema; No deformity.  Extensive scarring from multiple surgeries on the right shin.  ASSESSMENT AND PLAN: .   CAD: Asymptomatic at rest and with activity.  Has a known chronic total occlusion of the right coronary artery but all other major conduits patent at recent cardiac catheterization September 2024.  The focus is on risk factor mitigation. CHF: NYHA functional class I and clinically euvolemic, but had high EDP at the time of his cardiac catheterization a few months ago.  Normal left ventricular systolic function.  Will benefit from treatment with an SGLT2 inhibitor rather than furosemide  as a daily diuretic.  Discussed the risks of intertrigo and Fournier's gangrene.  Start Jardiance  10 mg once daily. DM: Not well-controlled.  Target A1c less than 7%.  Adding Jardiance  will help this as well.  Already on metformin , glimepiride  and semaglutide  at maximum dose. HLP: Low HDL cholesterol may improve with weight loss.  LDL well within target, under 55.  Normal triglycerides.  Continue rosuvastatin  and ezetimibe . S/P L CEA: No bruit heard.  Recent duplex ultrasound shows both carotids are widely patent with less than 40% plaque.  No recent neurological complaints.  Will stop the aspirin  due to severe bruising, but continue clopidogrel  75 mg once daily. OSA: Reports 100% compliance with CPAP and denies daytime hypersomnolence. Morbid obesity: Unfortunately he has only lost about 10 pounds on the semaglutide .  Adding SGLT2 inhibitor may provide a little extra weight loss. CKD3A: Creatinine has varied quite a bit in the last 5 years but on average has been similar to the most recent value around 1.3 (range 1.1-1.5). Planned blepharoplasty: Okay to hold clopidogrel  for 5 to 7 days before the procedure. Asc Ao dilation: Considering his age and body habitus, diameter of 4 cm is probably at the upper limit of normal.         Dispo: Start Jardiance   10 mg daily, discontinue daily furosemide , stop aspirin , continue clopidogrel  75 mg daily.  Stop clopidogrel  5 days before your eyelid surgery and resume it after the surgery is completed.  Signed, Luana Rumple, MD

## 2023-12-01 ENCOUNTER — Ambulatory Visit: Payer: Medicare HMO | Admitting: Cardiovascular Disease

## 2023-12-06 ENCOUNTER — Telehealth: Payer: Self-pay

## 2023-12-06 ENCOUNTER — Other Ambulatory Visit: Payer: Self-pay

## 2023-12-06 DIAGNOSIS — I6523 Occlusion and stenosis of bilateral carotid arteries: Secondary | ICD-10-CM

## 2023-12-06 NOTE — Telephone Encounter (Signed)
   Patient Name: KAMRY STIKA  DOB: August 06, 1945 MRN: [id number]  Primary Cardiologist: Luana Rumple, MD  Chart reviewed as part of pre-operative protocol coverage. Patient was seen in clinic on 11/22/23 by Dr. Alvis Ba, patient was doing well from a cardiovascular standpoint. Given past medical history and time since last visit, based on ACC/AHA guidelines, JVON TRUSSEL is at acceptable risk for the planned procedure without further cardiovascular testing.   Per Dr. Leola Raisin note patient can stop Plavix  for 5 days prior to procedure, please resume once surgery is completed. Per Dr. Leola Raisin notes patient has been instructed to stop aspirin .   I will route this recommendation to the requesting party via Epic fax function and remove from pre-op pool.  Please call with questions.  Owen Pagnotta D Jolin Benavides, NP 12/06/2023, 4:30 PM

## 2023-12-06 NOTE — Telephone Encounter (Signed)
   Pre-operative Risk Assessment    Patient Name: [name] [name]  DOB: Jul 29, 1946 MRN: [id number]   Date of last office visit: 11/22/23 Luana Rumple, MD Date of next office visit: NONE   Request for Surgical Clearance    Procedure:   RIGHT LOWER EYELID ECTROPION REPAIR  Date of Surgery:  Clearance 01/01/24                                Surgeon:  DR Peggie Bowen Surgeon's Group or Practice Name:  [facility] Phone number:  [phone] Fax number:  [phone]   Type of Clearance Requested:   - Medical  - Pharmacy:  Hold Aspirin  and Clopidogrel  (Plavix )     Type of Anesthesia:  MAC   Additional requests/questions:    Signed, Collin Deal   12/06/2023, 3:46 PM

## 2023-12-07 ENCOUNTER — Other Ambulatory Visit: Payer: Self-pay | Admitting: Student

## 2023-12-07 DIAGNOSIS — E1159 Type 2 diabetes mellitus with other circulatory complications: Secondary | ICD-10-CM

## 2023-12-07 DIAGNOSIS — F3289 Other specified depressive episodes: Secondary | ICD-10-CM

## 2023-12-12 ENCOUNTER — Other Ambulatory Visit (HOSPITAL_BASED_OUTPATIENT_CLINIC_OR_DEPARTMENT_OTHER): Payer: Self-pay | Admitting: *Deleted

## 2023-12-12 ENCOUNTER — Ambulatory Visit (INDEPENDENT_AMBULATORY_CARE_PROVIDER_SITE_OTHER): Admitting: Family Medicine

## 2023-12-12 DIAGNOSIS — I77810 Thoracic aortic ectasia: Secondary | ICD-10-CM

## 2023-12-14 ENCOUNTER — Encounter (HOSPITAL_COMMUNITY)

## 2023-12-14 ENCOUNTER — Ambulatory Visit: Admitting: Vascular Surgery

## 2024-01-01 DIAGNOSIS — H02115 Cicatricial ectropion of left lower eyelid: Secondary | ICD-10-CM | POA: Diagnosis not present

## 2024-01-01 DIAGNOSIS — H02132 Senile ectropion of right lower eyelid: Secondary | ICD-10-CM | POA: Diagnosis not present

## 2024-01-01 DIAGNOSIS — H02122 Mechanical ectropion of right lower eyelid: Secondary | ICD-10-CM | POA: Diagnosis not present

## 2024-01-01 DIAGNOSIS — H16211 Exposure keratoconjunctivitis, right eye: Secondary | ICD-10-CM | POA: Diagnosis not present

## 2024-01-01 DIAGNOSIS — H16212 Exposure keratoconjunctivitis, left eye: Secondary | ICD-10-CM | POA: Diagnosis not present

## 2024-01-01 DIAGNOSIS — H04521 Eversion of right lacrimal punctum: Secondary | ICD-10-CM | POA: Diagnosis not present

## 2024-01-01 DIAGNOSIS — H02112 Cicatricial ectropion of right lower eyelid: Secondary | ICD-10-CM | POA: Diagnosis not present

## 2024-01-01 DIAGNOSIS — H02142 Spastic ectropion of right lower eyelid: Secondary | ICD-10-CM | POA: Diagnosis not present

## 2024-01-01 DIAGNOSIS — H02145 Spastic ectropion of left lower eyelid: Secondary | ICD-10-CM | POA: Diagnosis not present

## 2024-01-01 DIAGNOSIS — H16213 Exposure keratoconjunctivitis, bilateral: Secondary | ICD-10-CM | POA: Diagnosis not present

## 2024-01-01 DIAGNOSIS — H02532 Eyelid retraction right lower eyelid: Secondary | ICD-10-CM | POA: Diagnosis not present

## 2024-01-05 ENCOUNTER — Other Ambulatory Visit: Payer: Self-pay | Admitting: Interventional Cardiology

## 2024-01-05 ENCOUNTER — Other Ambulatory Visit: Payer: Self-pay | Admitting: Student

## 2024-01-05 DIAGNOSIS — F3289 Other specified depressive episodes: Secondary | ICD-10-CM

## 2024-01-09 ENCOUNTER — Other Ambulatory Visit: Payer: Self-pay | Admitting: Student

## 2024-01-09 DIAGNOSIS — E1169 Type 2 diabetes mellitus with other specified complication: Secondary | ICD-10-CM

## 2024-01-09 DIAGNOSIS — E1159 Type 2 diabetes mellitus with other circulatory complications: Secondary | ICD-10-CM

## 2024-01-09 DIAGNOSIS — E7849 Other hyperlipidemia: Secondary | ICD-10-CM

## 2024-01-16 ENCOUNTER — Encounter (INDEPENDENT_AMBULATORY_CARE_PROVIDER_SITE_OTHER): Payer: Self-pay | Admitting: Family Medicine

## 2024-01-16 ENCOUNTER — Ambulatory Visit (INDEPENDENT_AMBULATORY_CARE_PROVIDER_SITE_OTHER): Admitting: Family Medicine

## 2024-01-16 VITALS — BP 134/64 | HR 65 | Temp 98.0°F | Ht 73.0 in | Wt 303.0 lb

## 2024-01-16 DIAGNOSIS — Z7985 Long-term (current) use of injectable non-insulin antidiabetic drugs: Secondary | ICD-10-CM

## 2024-01-16 DIAGNOSIS — E559 Vitamin D deficiency, unspecified: Secondary | ICD-10-CM | POA: Diagnosis not present

## 2024-01-16 DIAGNOSIS — E119 Type 2 diabetes mellitus without complications: Secondary | ICD-10-CM | POA: Diagnosis not present

## 2024-01-16 DIAGNOSIS — E669 Obesity, unspecified: Secondary | ICD-10-CM

## 2024-01-16 DIAGNOSIS — E1159 Type 2 diabetes mellitus with other circulatory complications: Secondary | ICD-10-CM

## 2024-01-16 DIAGNOSIS — E66813 Obesity, class 3: Secondary | ICD-10-CM

## 2024-01-16 DIAGNOSIS — Z6839 Body mass index (BMI) 39.0-39.9, adult: Secondary | ICD-10-CM | POA: Diagnosis not present

## 2024-01-16 MED ORDER — SEMAGLUTIDE (2 MG/DOSE) 8 MG/3ML ~~LOC~~ SOPN: 2.0000 mg | PEN_INJECTOR | SUBCUTANEOUS | 0 refills | Status: DC

## 2024-01-16 MED ORDER — VITAMIN D (ERGOCALCIFEROL) 1.25 MG (50000 UNIT) PO CAPS
50000.0000 [IU] | ORAL_CAPSULE | ORAL | 0 refills | Status: DC
Start: 2024-01-16 — End: 2024-04-02

## 2024-01-16 NOTE — Progress Notes (Signed)
 Office: [phone]  /  Fax: [phone]  WEIGHT SUMMARY AND BIOMETRICS  Anthropometric Measurements Height: 6' 1 (1.854 m) Weight: (!) 303 lb (137.4 kg) BMI (Calculated): 39.98 Weight at Last Visit: 302 lb Weight Lost Since Last Visit: 0 lb Weight Gained Since Last Visit: 1 lb Starting Weight: 340 lb Total Weight Loss (lbs): 37 lb (16.8 kg) Peak Weight: 353 lb   Body Composition  Body Fat %: 40.1 % Fat Mass (lbs): 121.6 lbs Muscle Mass (lbs): 172.8 lbs Total Body Water  (lbs): 142.2 lbs Visceral Fat Rating : 28   Other Clinical Data RMR: 2420 Fasting: no Labs: no Today's Visit #: 106 Starting Date: 12/28/16    Chief Complaint: OBESITY   History of Present Illness [name] [name] is a 78 year old male with obesity and type 2 diabetes who presents for obesity treatment plan discussion.  He has been following the prescribed category three obesity treatment plan about 50-60% of the time. Despite gaining one pound in the last month, he has lost a total of 37 pounds since starting the program. He maintains physical activity by walking 15-20 minutes most days of the week. A recent trip to [place] , where he cared for his brother post-surgery, disrupted his dietary routine. His brother's poor dietary habits influenced his eating during the stay.  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 He acknowledges enabling his family for many years, which adds to his stress.  He underwent surgery for a droopy eyelid that was causing fluid retention. Post-surgery, he uses a gel in the morning and at night, and eye drops three times a day, although he finds the drops difficult to manage.  His blood sugar levels have been fairly stable, with fasting glucose readings typically between 140-160 mg/dL. His last recorded A1c was 7.0% in April, down from 7.7%. He is currently using  Ozempic  for diabetes management but has been off it for six weeks due to being out of town. He awaits further instructions from his cardiologist regarding potential medication changes.      PHYSICAL EXAM:  Blood pressure 134/64, pulse 65, temperature 98 F (36.7 C), height 6' 1 (1.854 m), weight (!) 303 lb (137.4 kg), SpO2 98%. Body mass index is 39.98 kg/m.  DIAGNOSTIC DATA REVIEWED:  BMET    Component Value Date/Time   NA 140 11/14/2023 0800   K 4.1 11/14/2023 0800   CL 101 11/14/2023 0800   CO2 22 11/14/2023 0800   GLUCOSE 151 (H) 11/14/2023 0800   GLUCOSE 211 (H) 08/18/2022 0001   BUN 21 11/14/2023 0800   CREATININE 1.34 (H) 11/14/2023 0800   CREATININE 1.16 04/29/2016 0853   CALCIUM  9.3 11/14/2023 0800   GFRNONAA 42 (L) 08/18/2022 0001   GFRNONAA 64 04/29/2016 0853   GFRAA 64 09/16/2020 1206   GFRAA 74 04/29/2016 0853   Lab Results  Component Value Date   HGBA1C 7.7 (H) 11/14/2023   HGBA1C 5.6 07/15/2014   Lab Results  Component Value Date   INSULIN  13.9 11/14/2023   INSULIN  22.9 12/28/2016   Lab Results  Component Value Date   TSH 1.430 11/14/2023   CBC    Component Value Date/Time   WBC 7.4 11/14/2023 0800   WBC 13.1 (H) 08/18/2022 0001   RBC 4.92 11/14/2023 0800   RBC 5.76 08/18/2022 0001   HGB 14.4 11/14/2023 0800   HCT 44.2 11/14/2023 0800  PLT 268 11/14/2023 0800   MCV 90 11/14/2023 0800   MCH 29.3 11/14/2023 0800   MCH 28.1 08/18/2022 0001   MCHC 32.6 11/14/2023 0800   MCHC 33.0 08/18/2022 0001   RDW 13.9 11/14/2023 0800   Iron Studies No results found for: IRON, TIBC, FERRITIN, IRONPCTSAT Lipid Panel     Component Value Date/Time   CHOL 95 (L) 11/14/2023 0800   TRIG 92 11/14/2023 0800   HDL 37 (L) 11/14/2023 0800   CHOLHDL 3.0 04/18/2022 1020   CHOLHDL 5.2 (H) 01/26/2016 1111   VLDL 46 (H) 01/26/2016 1111   LDLCALC 40 11/14/2023 0800   LDLDIRECT 148 (H) 11/15/2007 1709   Hepatic Function Panel     Component Value  Date/Time   PROT 6.1 11/14/2023 0800   ALBUMIN 4.3 11/14/2023 0800   AST 24 11/14/2023 0800   ALT 45 (H) 11/14/2023 0800   ALKPHOS 63 11/14/2023 0800   BILITOT 0.6 11/14/2023 0800      Component Value Date/Time   TSH 1.430 11/14/2023 0800   Nutritional Lab Results  Component Value Date   VD25OH 68.2 11/14/2023   VD25OH 63.9 02/08/2023   VD25OH 65.0 09/21/2022     Assessment and Plan Assessment & Plan Type 2 Diabetes Mellitus Blood glucose levels are well-managed despite recent stressors and dietary challenges. Last A1c in April was 7.0, with fasting glucose of 151 mg/dL. Current fasting glucose levels range from 140s to 160s. Cardiologist plans to switch from Ozempic  to Mounjaro  for improved glycemic control and weight loss, supported by studies indicating Mounjaro 's superior efficacy. He states the cardiology team will help him with patient assistance to afford his medications, which is wonderful and should help his DM control. - Send Ozempic  prescription to pharmacy as a backup - Discuss Mounjaro  switch with cardiologist and start ASAP - Monitor blood glucose levels regularly -Continue working on DM diet nad weight loss efforts  Obesity Adheres to category three obesity treatment plan 50-60% of the time. Gained one pound last month but lost 37 pounds total since program initiation. Weight gain attributed to dietary challenges while caring for brother in [place] . Engages in 15-20 minutes of walking most days. - Continue category three obesity treatment plan - Encourage consistent physical activity - Advise on preparing healthier meals during travel  General Health Maintenance Vitamin D  level is 68 ng/mL, and B12 level is 611 pg/mL, both within normal ranges. - Continue current vitamin D  and will rf today   He was informed of the importance of frequent follow up visits to maximize his success with intensive lifestyle modifications for his multiple health conditions.     Jasmine Mesi, MD

## 2024-01-17 ENCOUNTER — Ambulatory Visit (HOSPITAL_COMMUNITY)
Admission: RE | Admit: 2024-01-17 | Discharge: 2024-01-17 | Disposition: A | Discharge status: Home / Self Care | Source: Ambulatory Visit | Attending: Cardiology | Admitting: Cardiology

## 2024-01-17 DIAGNOSIS — I77810 Thoracic aortic ectasia: Secondary | ICD-10-CM

## 2024-01-17 LAB — ECHOCARDIOGRAM COMPLETE
Area-P 1/2: 2.93 cm2
P 1/2 time: 547 ms
S' Lateral: 3.75 cm

## 2024-01-18 ENCOUNTER — Ambulatory Visit: Payer: Self-pay | Admitting: Physician Assistant

## 2024-01-18 DIAGNOSIS — I77810 Thoracic aortic ectasia: Secondary | ICD-10-CM

## 2024-02-05 ENCOUNTER — Other Ambulatory Visit: Payer: Self-pay | Admitting: Student

## 2024-02-05 DIAGNOSIS — F3289 Other specified depressive episodes: Secondary | ICD-10-CM

## 2024-02-05 DIAGNOSIS — I10 Essential (primary) hypertension: Secondary | ICD-10-CM

## 2024-02-07 DIAGNOSIS — Z09 Encounter for follow-up examination after completed treatment for conditions other than malignant neoplasm: Secondary | ICD-10-CM | POA: Diagnosis not present

## 2024-02-07 DIAGNOSIS — H04551 Acquired stenosis of right nasolacrimal duct: Secondary | ICD-10-CM | POA: Diagnosis not present

## 2024-02-07 DIAGNOSIS — H04221 Epiphora due to insufficient drainage, right lacrimal gland: Secondary | ICD-10-CM | POA: Diagnosis not present

## 2024-02-07 DIAGNOSIS — H04211 Epiphora due to excess lacrimation, right lacrimal gland: Secondary | ICD-10-CM | POA: Diagnosis not present

## 2024-02-07 DIAGNOSIS — H0279 Other degenerative disorders of eyelid and periocular area: Secondary | ICD-10-CM | POA: Diagnosis not present

## 2024-02-07 DIAGNOSIS — H02535 Eyelid retraction left lower eyelid: Secondary | ICD-10-CM | POA: Diagnosis not present

## 2024-02-07 DIAGNOSIS — H02532 Eyelid retraction right lower eyelid: Secondary | ICD-10-CM | POA: Diagnosis not present

## 2024-02-13 ENCOUNTER — Ambulatory Visit (INDEPENDENT_AMBULATORY_CARE_PROVIDER_SITE_OTHER): Admitting: Family Medicine

## 2024-02-15 ENCOUNTER — Ambulatory Visit: Attending: Vascular Surgery | Admitting: Physician Assistant

## 2024-02-15 ENCOUNTER — Ambulatory Visit (HOSPITAL_COMMUNITY)
Admission: RE | Admit: 2024-02-15 | Discharge: 2024-02-15 | Disposition: A | Discharge status: Home / Self Care | Source: Ambulatory Visit | Attending: Vascular Surgery | Admitting: Vascular Surgery

## 2024-02-15 VITALS — BP 116/67 | HR 65 | Temp 98.0°F | Wt 306.2 lb

## 2024-02-15 DIAGNOSIS — I6523 Occlusion and stenosis of bilateral carotid arteries: Secondary | ICD-10-CM
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
## 2024-02-16 ENCOUNTER — Ambulatory Visit (INDEPENDENT_AMBULATORY_CARE_PROVIDER_SITE_OTHER): Admitting: Family Medicine

## 2024-02-16 ENCOUNTER — Encounter: Payer: Self-pay | Admitting: Family Medicine

## 2024-02-16 VITALS — BP 120/60 | HR 70 | Ht 73.0 in | Wt 306.1 lb

## 2024-02-16 DIAGNOSIS — L739 Follicular disorder, unspecified: Secondary | ICD-10-CM

## 2024-02-16 DIAGNOSIS — E1169 Type 2 diabetes mellitus with other specified complication: Secondary | ICD-10-CM

## 2024-02-16 LAB — POCT GLYCOSYLATED HEMOGLOBIN (HGB A1C): HbA1c, POC (controlled diabetic range): 7.9 % — AB (ref 0.0–7.0)

## 2024-02-16 MED ORDER — MUPIROCIN 2 % EX OINT: 1.0000 | TOPICAL_OINTMENT | Freq: Two times a day (BID) | CUTANEOUS | 0 refills | Status: DC

## 2024-02-16 MED ORDER — TRIPLE ANTIBIOTIC 3.5-400-5000 EX OINT
1.0000 | TOPICAL_OINTMENT | Freq: Two times a day (BID) | CUTANEOUS | 0 refills | Status: AC
Start: 2024-02-16 — End: 2024-02-23

## 2024-02-16 NOTE — Assessment & Plan Note (Signed)
 A1C 7.9 today, slightly increased from 7/7 in April 2025 He is currently trying to get financial assistance for jardiance   Otherwise taking glimepiride  and metformin  Could be glp1 candidate Will recheck in 3 months Pending urine microalbumin/creatinine

## 2024-02-16 NOTE — Progress Notes (Signed)
 Discussed with the resident and initially prescribed Mupirocin . However, it seems Neosporin will provide better coverage for skin folliculitis. I called the patient to inform him that we will cancel his Mupirocin  and he can pick up Neosporin which will provide better skin coverage.  I called and spoke with the pharmacist, Meridith, to cancel his Mupirocin . Unfortunately, he'd have to pick up Neosporin over the counter. Meridith agreed to provide counseling to him when he comes in to pick up meds. PCP aware of all changes made.

## 2024-02-16 NOTE — Progress Notes (Signed)
     SUBJECTIVE:   Chief compliant/HPI: annual examination  [name] [name] is a 78 y.o. who presents today for an annual exam.   ? Rash and itching for x2 weeks Arms and abd No outside exposures. Cortaid with no improvement.   Attempting to get financial assistance for jardiance   Problem list reviewed and updated   Annual Examination  See AVS for age appropriate recommendations.  PHQ score 0, reviewed and discussed.  Blood pressure value is at goal, discussed.   Considered the following screening exams based upon USPSTF recommendations: Diabetes screening: discussed Screening for elevated cholesterol: not due Colorectal cancer screening: up to date on screening for CRC. Lung cancer screening: N/A See documentation below regarding discussion and indication.    OBJECTIVE:   BP 120/60   Pulse 70   Ht 6' 1 (1.854 m)   Wt (!) 306 lb 2 oz (138.9 kg)   SpO2 98%   BMI 40.39 kg/m   General: well appearing, NAD Cardiovascular: RRR, no m/r/g Respiratory: normal work of breathing on RA, CTAB Skin: Scattered pustules with erythematous base to abdomen, chest, and bilateral upper arms  Extremities: No swelling BLE  ASSESSMENT/PLAN:   Assessment & Plan Type 2 diabetes mellitus with other specified complication, unspecified whether long term insulin  use (HCC) A1C 7.9 today, slightly increased from 7/7 in April 2025 He is currently trying to get financial assistance for jardiance   Otherwise taking glimepiride  and metformin  Could be glp1 candidate Will recheck in 3 months Pending urine microalbumin/creatinine  Folliculitis Exam consistent with folliculitis Will send neomycin-bacitracin-polymyxin [phone] BID x7 days Return if sx do not improve in 1-2 weeks     Elyce Prescott, DO [facility]

## 2024-02-16 NOTE — Patient Instructions (Signed)
 Good to see you today - Thank you for coming in  Things we discussed today: I have sent a topical cream for your rash If it does not improve in 1-2 weeks please come back   Please always bring your medication bottles  Come back to see me in 3 months

## 2024-02-17 LAB — MICROALBUMIN / CREATININE URINE RATIO
Creatinine, Urine: 93.3 mg/dL
Microalb/Creat Ratio: 8 mg/g{creat} (ref 0–29)
Microalbumin, Urine: 7.4 ug/mL

## 2024-02-19 ENCOUNTER — Ambulatory Visit: Payer: Self-pay | Admitting: Family Medicine

## 2024-02-26 ENCOUNTER — Ambulatory Visit (INDEPENDENT_AMBULATORY_CARE_PROVIDER_SITE_OTHER)

## 2024-02-26 ENCOUNTER — Telehealth: Payer: Self-pay

## 2024-02-26 VITALS — BP 139/59 | HR 72 | Ht 73.0 in | Wt 310.2 lb

## 2024-02-26 DIAGNOSIS — L309 Dermatitis, unspecified: Secondary | ICD-10-CM | POA: Diagnosis not present

## 2024-02-26 DIAGNOSIS — M67449 Ganglion, unspecified hand: Secondary | ICD-10-CM | POA: Diagnosis not present

## 2024-02-26 MED ORDER — TRIAMCINOLONE ACETONIDE 0.1 % EX CREA: 1.0000 | TOPICAL_CREAM | Freq: Two times a day (BID) | CUTANEOUS | 0 refills | Status: DC

## 2024-02-26 NOTE — Patient Instructions (Signed)
 It was wonderful to see you today.  Please bring ALL of your medications with you to every visit.   Today we talked about:  Try this steroid cream twice a day for your rash.  Follow up with the dermatology clinic on Thursday for further assessment if your symptoms have not improved.  Thank you for choosing [facility].   Please call [phone] with any questions about today's appointment.  Please arrive at least 15 minutes prior to your scheduled appointments.   If you need additional refills before your next appointment, please call your pharmacy first.   Leslieanne Cobarrubias Alena Morrison, MD  [facility]

## 2024-02-26 NOTE — Telephone Encounter (Signed)
 Patient calls nurse line regarding prescription for Triamcinolone . He reports that prescription was supposed to be sent to [facility], however, was sent to [facility].   Called [facility]. Cancelled prescription and resent to [facility].   Chiquita JAYSON English, RN

## 2024-02-26 NOTE — Progress Notes (Signed)
    SUBJECTIVE:   CHIEF COMPLAINT / HPI: Itching rash  Itching rash on medial upper arms and around waistband for three weeks. Used the neomycin-bacitracin-polymyxin [phone] BID for a week without benefit.  No changes in soaps, detergent, or medications. No one else lives in the home.  He usually spends a lot of time outside, but this is nothing new.  At the end of the visit, he mentioned a bump on his right fourth finger PIP that is occasionally painful. He tried to hit it with a bible, but it did not go away. It has slowly been getting bigger.   PERTINENT  PMH / PSH: diabetes, HTN, HLD, CAD  OBJECTIVE:   BP (!) 139/59   Pulse 72   Ht 6' 1 (1.854 m)   Wt (!) 310 lb 3.2 oz (140.7 kg)   SpO2 100%   BMI 40.93 kg/m    General: Well appearing and alert patient. No acute distress.  Skin: Skin is warm, dry and intact. Erythematous regions of the medial upper arms with raised plaques. Excoriations overlying. Posterior left hip with excoriations and several areas of small scabbing. Anterior beltline with areas of excoriations but no additional lesions.  Right fourth finger PIP there is a mobile, nontender, well circumscribed mass that transilluminates.   Left medial arm:  Left hip:    ASSESSMENT/PLAN:   Assessment & Plan Dermatitis, unspecified Neomycin-bacitracin-polymixin did not benefit his symptoms. Suspect this is likely a contact dermatitis. Prescribed topical triamcinolone  cream to be applied BID. Ganglion cyst of finger No s/s of infection. Intermittently bothersome.   Follow up with derm clinic on Thursday for reassessment of rash and evaluation/treatment of likely ganglion cyst.    Marcquis Ridlon Alena Morrison, MD [facility]

## 2024-02-29 ENCOUNTER — Ambulatory Visit (INDEPENDENT_AMBULATORY_CARE_PROVIDER_SITE_OTHER): Admitting: Family Medicine

## 2024-02-29 VITALS — BP 128/66 | HR 65 | Ht 73.0 in | Wt 306.8 lb

## 2024-02-29 DIAGNOSIS — M67449 Ganglion, unspecified hand: Secondary | ICD-10-CM

## 2024-02-29 DIAGNOSIS — R21 Rash and other nonspecific skin eruption: Secondary | ICD-10-CM

## 2024-02-29 MED ORDER — PREDNISONE 20 MG PO TABS: 20.0000 mg | ORAL_TABLET | Freq: Every day | ORAL | 0 refills | Status: DC

## 2024-02-29 MED ORDER — CLINDAMYCIN PHOS-BENZOYL PEROX 1-5 % EX GEL: Freq: Two times a day (BID) | CUTANEOUS | 0 refills | Status: AC

## 2024-02-29 NOTE — Progress Notes (Signed)
    SUBJECTIVE:   CHIEF COMPLAINT / HPI:   [name] [name] presents to the dermatology clinic for follow up of maculo papular rash to the L hip and L arm. He cannot think of any inciting event or contact. He has been using triamcinolone  cream with immediate relief but not permanent and it remains with pruritus. He has not noticed any relief with allergy medications such as Allegra or the cream. No new detergents, soaps, foods, or any other event or contact.    OBJECTIVE:   BP 128/66   Pulse 65   Ht 6' 1 (1.854 m)   Wt (!) 306 lb 12.8 oz (139.2 kg)   SpO2 98%   BMI 40.48 kg/m    General: A&O, NAD, pleasant HEENT: No sign of trauma, EOM grossly intact Respiratory: normal WOB GI: non-distended  Extremities: no peripheral edema. Neuro: Normal gait, moves all four extremities appropriately Skin: maculopapular rash to anterior trunk, L hip, and L medial upper arm without surrounding erythema, 1cm ganglion cyst to the dorsal R 4th digit over the MCP joint  Psych: Appropriate mood and affect   ASSESSMENT/PLAN:   Assessment & Plan Rash Rash on anterior trunk appears to be more like folliculitis. Triamcinolone  cream has not worked.  -Begin 20mg  oral prednisone  daily for 5 days.  -Begin clinda-benzoyl cream over trunk for possible folliculitis.  -Consider referral to allergist if these treatments fail. -Return to dermatology clinic in 1 week. Ganglion cyst of finger -Hand xray ordered. -Consider referral to hand surgeon after review of hand xray.     [name] Dixons, DO [facility]

## 2024-02-29 NOTE — Patient Instructions (Signed)
 It was wonderful to see you today.  Please bring ALL of your medications with you to every visit.   Today we talked about:  Your rash. Take 20mg  of prednisone  1 tablet daily for 5 days. Use the benzoyl-peroxide gel until your next visit on the lower abdominal rash.  Return to dermatology clinic in 1 week.   Thank you for choosing [facility].   Please call [phone] with any questions about today's appointment.  Please arrive at least 15 minutes prior to your scheduled appointments.   If you had blood work today, I will send you a MyChart message or a letter if results are normal. Otherwise, I will give you a call.   If you had a referral placed, they will call you to set up an appointment. Please give us  a call if you don't hear back in the next 2 weeks.   If you need additional refills before your next appointment, please call your pharmacy first.   You should follow up in our clinic in 1 week.   Camie Dixons, DO Family Medicine

## 2024-03-01 ENCOUNTER — Ambulatory Visit (HOSPITAL_COMMUNITY)
Admission: RE | Admit: 2024-03-01 | Discharge: 2024-03-01 | Disposition: A | Discharge status: Home / Self Care | Source: Ambulatory Visit | Attending: Family Medicine | Admitting: Family Medicine

## 2024-03-01 DIAGNOSIS — M6740 Ganglion, unspecified site: Secondary | ICD-10-CM | POA: Diagnosis not present

## 2024-03-01 DIAGNOSIS — M67449 Ganglion, unspecified hand: Secondary | ICD-10-CM | POA: Diagnosis not present

## 2024-03-04 ENCOUNTER — Encounter (INDEPENDENT_AMBULATORY_CARE_PROVIDER_SITE_OTHER): Payer: Self-pay | Admitting: Family Medicine

## 2024-03-04 ENCOUNTER — Ambulatory Visit (INDEPENDENT_AMBULATORY_CARE_PROVIDER_SITE_OTHER): Admitting: Family Medicine

## 2024-03-04 VITALS — BP 123/75 | HR 74 | Temp 98.7°F | Ht 73.0 in | Wt 304.0 lb

## 2024-03-04 DIAGNOSIS — R600 Localized edema: Secondary | ICD-10-CM | POA: Diagnosis not present

## 2024-03-04 DIAGNOSIS — Z7984 Long term (current) use of oral hypoglycemic drugs: Secondary | ICD-10-CM

## 2024-03-04 DIAGNOSIS — G4733 Obstructive sleep apnea (adult) (pediatric): Secondary | ICD-10-CM

## 2024-03-04 DIAGNOSIS — R21 Rash and other nonspecific skin eruption: Secondary | ICD-10-CM

## 2024-03-04 DIAGNOSIS — E669 Obesity, unspecified: Secondary | ICD-10-CM

## 2024-03-04 DIAGNOSIS — E119 Type 2 diabetes mellitus without complications: Secondary | ICD-10-CM | POA: Diagnosis not present

## 2024-03-04 DIAGNOSIS — Z7985 Long-term (current) use of injectable non-insulin antidiabetic drugs: Secondary | ICD-10-CM

## 2024-03-04 DIAGNOSIS — E559 Vitamin D deficiency, unspecified: Secondary | ICD-10-CM

## 2024-03-04 DIAGNOSIS — E1169 Type 2 diabetes mellitus with other specified complication: Secondary | ICD-10-CM

## 2024-03-04 DIAGNOSIS — I5030 Unspecified diastolic (congestive) heart failure: Secondary | ICD-10-CM

## 2024-03-04 DIAGNOSIS — Z6841 Body Mass Index (BMI) 40.0 and over, adult: Secondary | ICD-10-CM

## 2024-03-04 DIAGNOSIS — I509 Heart failure, unspecified: Secondary | ICD-10-CM | POA: Diagnosis not present

## 2024-03-04 MED ORDER — SEMAGLUTIDE (1 MG/DOSE) 4 MG/3ML ~~LOC~~ SOPN
1.0000 mg | PEN_INJECTOR | SUBCUTANEOUS | 0 refills | Status: DC
Start: 2024-03-04 — End: 2024-04-02

## 2024-03-04 NOTE — Progress Notes (Signed)
 Office: [phone]  /  Fax: [phone]  WEIGHT SUMMARY AND BIOMETRICS  Anthropometric Measurements Height: 6' 1 (1.854 m) Weight: (!) 304 lb (137.9 kg) BMI (Calculated): 40.12 Weight at Last Visit: 303lb Weight Lost Since Last Visit: 0lb Weight Gained Since Last Visit: 1lb Starting Weight: 340 Total Weight Loss (lbs): 37 lb (16.8 kg)   Body Composition  Body Fat %: 40.9 % Fat Mass (lbs): 124.4 lbs Muscle Mass (lbs): 170.8 lbs Total Body Water  (lbs): 145.4 lbs Visceral Fat Rating : 29   Other Clinical Data Fasting: No Labs: No Today's Visit #: 107 Starting Date: 12/28/16    Chief Complaint: OBESITY  History of Present Illness [name] [name] is a 78 year old male who presents for obesity treatment and progress assessment.  He follows a low-carb eating plan about 75% of the time, focusing on increasing protein intake and water  consumption, but skips meals and does not consume enough vegetables. Despite exercising daily for sixty minutes and walking about ten thousand steps per day, he has gained one pound in the last six weeks.  He is being treated for vitamin D  deficiency with prescription vitamin D  at a dose of fifty thousand international units every two weeks. For edema, he takes Lasix  40 mg daily as needed. He had a past incident of dehydration requiring hospitalization and is now focused on maintaining hydration. No significant issues with lower extremity edema or shortness of breath were reported.  He uses a CPAP machine for obstructive sleep apnea and reports good compliance with a significant reduction in apnea episodes.  He has an itchy rash that has not responded well to two prescribed creams and a Medrol  Dosepak. He is scheduled to see a dermatologist for further evaluation.  He manages type 2 diabetes with Jardiance  and is addressing medication affordability through patient assistance programs. He has not taken Ozempic  for two months due to  personal circumstances and is considering resuming it.      PHYSICAL EXAM:  Blood pressure 123/75, pulse 74, temperature 98.7 F (37.1 C), height 6' 1 (1.854 m), weight (!) 304 lb (137.9 kg), SpO2 98%. Body mass index is 40.11 kg/m.  DIAGNOSTIC DATA REVIEWED:  BMET    Component Value Date/Time   NA 140 11/14/2023 0800   K 4.1 11/14/2023 0800   CL 101 11/14/2023 0800   CO2 22 11/14/2023 0800   GLUCOSE 151 (H) 11/14/2023 0800   GLUCOSE 211 (H) 08/18/2022 0001   BUN 21 11/14/2023 0800   CREATININE 1.34 (H) 11/14/2023 0800   CREATININE 1.16 04/29/2016 0853   CALCIUM  9.3 11/14/2023 0800   GFRNONAA 42 (L) 08/18/2022 0001   GFRNONAA 64 04/29/2016 0853   GFRAA 64 09/16/2020 1206   GFRAA 74 04/29/2016 0853   Lab Results  Component Value Date   HGBA1C 7.9 (A) 02/16/2024   HGBA1C 5.6 07/15/2014   Lab Results  Component Value Date   INSULIN  13.9 11/14/2023   INSULIN  22.9 12/28/2016   Lab Results  Component Value Date   TSH 1.430 11/14/2023   CBC    Component Value Date/Time   WBC 7.4 11/14/2023 0800   WBC 13.1 (H) 08/18/2022 0001   RBC 4.92 11/14/2023 0800   RBC 5.76 08/18/2022 0001   HGB 14.4 11/14/2023 0800   HCT 44.2 11/14/2023 0800   PLT 268 11/14/2023 0800   MCV 90 11/14/2023 0800   MCH 29.3 11/14/2023 0800   MCH 28.1 08/18/2022 0001   MCHC 32.6 11/14/2023 0800  MCHC 33.0 08/18/2022 0001   RDW 13.9 11/14/2023 0800   Iron Studies No results found for: IRON, TIBC, FERRITIN, IRONPCTSAT Lipid Panel     Component Value Date/Time   CHOL 95 (L) 11/14/2023 0800   TRIG 92 11/14/2023 0800   HDL 37 (L) 11/14/2023 0800   CHOLHDL 3.0 04/18/2022 1020   CHOLHDL 5.2 (H) 01/26/2016 1111   VLDL 46 (H) 01/26/2016 1111   LDLCALC 40 11/14/2023 0800   LDLDIRECT 148 (H) 11/15/2007 1709   Hepatic Function Panel     Component Value Date/Time   PROT 6.1 11/14/2023 0800   ALBUMIN 4.3 11/14/2023 0800   AST 24 11/14/2023 0800   ALT 45 (H) 11/14/2023 0800    ALKPHOS 63 11/14/2023 0800   BILITOT 0.6 11/14/2023 0800      Component Value Date/Time   TSH 1.430 11/14/2023 0800   Nutritional Lab Results  Component Value Date   VD25OH 68.2 11/14/2023   VD25OH 63.9 02/08/2023   VD25OH 65.0 09/21/2022     Assessment and Plan Assessment & Plan Obesity Obesity management includes a low-carb eating plan followed 75% of the time, with an exercise regimen of 60 minutes daily and 10,000 steps per day. Weight gain of one pound in the last six weeks, with noted skipping of meals and insufficient vegetable intake. - Continue low-carb eating plan - Encourage consistent meal intake and increased vegetable consumption - Continue current exercise regimen  Type 2 diabetes mellitus Type 2 diabetes management includes Jardiance . Discussion about affordability and assistance programs for medications, including Ozempic , which has not been taken for two months. Consideration of switching to Mounjaro  if assistance for Ozempic  is not feasible. - Assist with patient assistance program for Jardiance  - Explore patient assistance for Ozempic    Edema B LE Edema managed with Lasix  40 mg as needed. No significant lower extremity edema or shortness of breath reported. Hydration is emphasized due to Lasix  use. Potassium levels have been stable, but monitoring is necessary due to Lasix  use. - Refill Lasix  prescription - Monitor potassium levels every six months - Encourage adequate hydration  - Work on minimizing high sodium and processed foods  Vitamin D  deficiency Vitamin D  deficiency treated with 50,000 IU every two weeks.  Obstructive sleep apnea Obstructive sleep apnea managed with CPAP, with good tolerance and significant improvement in apnea events. - Continue diet, exercise and weight loss as discussed today as an important part of her treatment plan  Congestive heart failure No worsening of congestive heart failure symptoms. No shortness of breath or  increased edema reported. - Continue monitoring for symptoms of fluid overload - Continue diet, exercise and weight loss as discussed today as an important part of her treatment plan  Pruritic rash Persistent pruritic rash not responding to topical steroids or prednisone . Dermatology appointment scheduled for further evaluation. - Attend dermatology appointment for rash evaluation     I have personally spent 43 minutes total time today in preparation, patient care, and documentation for this visit, including the following: review of clinical lab tests; review of medical history, nutritional education and discussing patient assistance forms and how to fill them out properly.   He was informed of the importance of frequent follow up visits to maximize his success with intensive lifestyle modifications for his multiple health conditions.    Louann Penton, MD

## 2024-03-05 DIAGNOSIS — D1801 Hemangioma of skin and subcutaneous tissue: Secondary | ICD-10-CM | POA: Diagnosis not present

## 2024-03-05 DIAGNOSIS — B078 Other viral warts: Secondary | ICD-10-CM | POA: Diagnosis not present

## 2024-03-05 DIAGNOSIS — L821 Other seborrheic keratosis: Secondary | ICD-10-CM | POA: Diagnosis not present

## 2024-03-05 DIAGNOSIS — L570 Actinic keratosis: Secondary | ICD-10-CM | POA: Diagnosis not present

## 2024-03-05 DIAGNOSIS — Z85828 Personal history of other malignant neoplasm of skin: Secondary | ICD-10-CM | POA: Diagnosis not present

## 2024-03-05 DIAGNOSIS — L814 Other melanin hyperpigmentation: Secondary | ICD-10-CM | POA: Diagnosis not present

## 2024-03-05 DIAGNOSIS — L2989 Other pruritus: Secondary | ICD-10-CM | POA: Diagnosis not present

## 2024-03-06 ENCOUNTER — Ambulatory Visit (INDEPENDENT_AMBULATORY_CARE_PROVIDER_SITE_OTHER): Admitting: Family Medicine

## 2024-03-07 ENCOUNTER — Ambulatory Visit (INDEPENDENT_AMBULATORY_CARE_PROVIDER_SITE_OTHER): Admitting: Family Medicine

## 2024-03-07 VITALS — BP 129/49 | Ht 73.0 in | Wt 308.6 lb

## 2024-03-07 DIAGNOSIS — L309 Dermatitis, unspecified: Secondary | ICD-10-CM | POA: Diagnosis not present

## 2024-03-07 DIAGNOSIS — M67449 Ganglion, unspecified hand: Secondary | ICD-10-CM

## 2024-03-07 MED ORDER — TRIAMCINOLONE ACETONIDE 0.1 % EX OINT: 1.0000 | TOPICAL_OINTMENT | Freq: Two times a day (BID) | CUTANEOUS | 2 refills | Status: AC

## 2024-03-07 NOTE — Patient Instructions (Addendum)
 It was nice seeing you again!  We've sent 2 refills of the triamcinolone  ointment to your pharmacy. Please apply this twice a day to the chest region for ~1-2 weeks and then apply it once a day once it starts to improve. Please continue to use the benzoyl-peroxide gel for your folliculitis.   We've also put in a referral to the hand surgeon. Please keep an eye out for that phone call so that you can get scheduled.   Please follow up on the rash at your next family medicine appointment, or make an appointment with the derm clinic if the rash is not improving.  Thanks!

## 2024-03-07 NOTE — Progress Notes (Signed)
    SUBJECTIVE:   CHIEF COMPLAINT / HPI:   [name] [name] returns to dermatology clinic for follow-up of previously noted maculopapular rash involving the anterior trunk, left hip, and left upper arm. He reports improvement in symptoms with oral prednisone  but still has some itchiness and erythema across the chest. He states that the only time the rash does not itch is when he is sweating and his shirt is wet. He also reports improvement in lower abdominal folliculitis with benzoyl-peroxide gel with occasional itching.   The ganglion cyst on the dorsal right 4th digit is stable in size. Hand xray has now been completed and the patient was advised to await scheduling.   OBJECTIVE:   BP (!) 129/49   Ht 6' 1 (1.854 m)   Wt (!) 308 lb 9.6 oz (140 kg)   SpO2 97%   BMI 40.71 kg/m   General: A&O, NAD, pleasant HEENT: No sign of trauma, EOM grossly intact Respiratory: Normal WOB GI: Non-distended Extremities: No peripheral edema Neuro: Normal gait, moves all four extremities appropriately Skin: Persistent maculopapular rash to anterior trunk, improved but still pruritic; 1cm ganglion cyst to dorsal R 4th digit over MCP joint Psych: Appropriate mood and affect  ASSESSMENT/PLAN:   Assessment & Plan Dermatitis, unspecified - Chest rash is likely an allergic contact dermatitis. The lower abdominal rash is likely folliculitis.  - Start triamcinolone  ointment on chest BID for 1-2 weeks, then taper to once daily as symptoms improve - Continue benzoyl-peroxide gel on lower abdomen - Patient instructed to follow up at next family medicine visit or schedule with dermatology if symptoms persist or worsen Ganglion cyst of finger - Hand xray completed, awaiting official read - Referral to hand surgeon has been placed    Nonda [name], Medical Student [facility]    I was present during key history taking and physical exam and any procedures.  I agree with the note above LITTIE Bee MD

## 2024-03-08 ENCOUNTER — Other Ambulatory Visit: Payer: Self-pay | Admitting: Student

## 2024-03-08 ENCOUNTER — Other Ambulatory Visit: Payer: Self-pay | Admitting: Family Medicine

## 2024-03-08 DIAGNOSIS — E7849 Other hyperlipidemia: Secondary | ICD-10-CM

## 2024-03-08 DIAGNOSIS — F3289 Other specified depressive episodes: Secondary | ICD-10-CM

## 2024-03-12 ENCOUNTER — Ambulatory Visit: Payer: Self-pay

## 2024-04-02 ENCOUNTER — Ambulatory Visit (INDEPENDENT_AMBULATORY_CARE_PROVIDER_SITE_OTHER): Admitting: Family Medicine

## 2024-04-02 ENCOUNTER — Encounter (INDEPENDENT_AMBULATORY_CARE_PROVIDER_SITE_OTHER): Payer: Self-pay | Admitting: Family Medicine

## 2024-04-02 ENCOUNTER — Other Ambulatory Visit: Payer: Self-pay | Admitting: Adult Health

## 2024-04-02 VITALS — BP 139/76 | HR 71 | Temp 97.9°F | Ht 73.0 in | Wt 294.0 lb

## 2024-04-02 DIAGNOSIS — E1169 Type 2 diabetes mellitus with other specified complication: Secondary | ICD-10-CM

## 2024-04-02 DIAGNOSIS — E785 Hyperlipidemia, unspecified: Secondary | ICD-10-CM | POA: Diagnosis not present

## 2024-04-02 DIAGNOSIS — E7849 Other hyperlipidemia: Secondary | ICD-10-CM

## 2024-04-02 DIAGNOSIS — Z7985 Long-term (current) use of injectable non-insulin antidiabetic drugs: Secondary | ICD-10-CM

## 2024-04-02 DIAGNOSIS — E559 Vitamin D deficiency, unspecified: Secondary | ICD-10-CM | POA: Diagnosis not present

## 2024-04-02 DIAGNOSIS — I10 Essential (primary) hypertension: Secondary | ICD-10-CM

## 2024-04-02 DIAGNOSIS — E119 Type 2 diabetes mellitus without complications: Secondary | ICD-10-CM

## 2024-04-02 DIAGNOSIS — E669 Obesity, unspecified: Secondary | ICD-10-CM

## 2024-04-02 DIAGNOSIS — Z6838 Body mass index (BMI) 38.0-38.9, adult: Secondary | ICD-10-CM | POA: Diagnosis not present

## 2024-04-02 MED ORDER — VITAMIN D (ERGOCALCIFEROL) 1.25 MG (50000 UNIT) PO CAPS: 50000.0000 [IU] | ORAL_CAPSULE | ORAL | 0 refills | Status: DC

## 2024-04-02 MED ORDER — SEMAGLUTIDE (2 MG/DOSE) 8 MG/3ML ~~LOC~~ SOPN: 2.0000 mg | PEN_INJECTOR | SUBCUTANEOUS | 0 refills | Status: DC

## 2024-04-02 NOTE — Progress Notes (Signed)
 Office: [phone]  /  Fax: [phone]  WEIGHT SUMMARY AND BIOMETRICS  Anthropometric Measurements Height: 6' 1 (1.854 m) Weight: 294 lb (133.4 kg) BMI (Calculated): 38.8 Weight at Last Visit: 304lb Weight Lost Since Last Visit: 10lb Weight Gained Since Last Visit: 0 Starting Weight: 340lb Total Weight Loss (lbs): 47 lb (21.3 kg)   Body Composition  Body Fat %: 39 % Fat Mass (lbs): 114.8 lbs Muscle Mass (lbs): 171 lbs Total Body Water  (lbs): 138.4 lbs Visceral Fat Rating : 27   Other Clinical Data Fasting: no Labs: no Today's Visit #: 108 Starting Date: 12/28/16    Chief Complaint: OBESITY   History of Present Illness [name] [name] is a 78 year old male with obesity who presents for a follow-up on his obesity treatment plan.  He has been following a low-carb eating plan approximately 85% of the time and engages in walking for 15 minutes six days a week, resulting in a 10-pound weight loss over the past month. He is currently using Ozempic , with a two-milligram pen at a dose of 38 clicks. No recent issues with blood sugar levels and no problems with his current low-carb diet. He is mindful of meeting his protein goals and occasionally skips meals, though this is not a regular occurrence.  He experienced a recent episode of near-collapse due to dehydration and overheating while assisting his sister with moving. He acknowledges inadequate hydration, attributing it to a lapse in his usual routine.  He has a history of vitamin D  deficiency and takes vitamin D  50,000 IU every 14 days. He mentions easy bruising, which has improved since discontinuing baby aspirin .  He shares a family history of weight problems, back problems, and blood clot issues among his siblings. He is involved in family activities, such as helping his sister move, and has a supportive role in his family dynamics.      PHYSICAL EXAM:  Blood pressure 139/76, pulse 71, temperature 97.9  F (36.6 C), height 6' 1 (1.854 m), weight 294 lb (133.4 kg), SpO2 96%. Body mass index is 38.79 kg/m.  DIAGNOSTIC DATA REVIEWED:  BMET    Component Value Date/Time   NA 140 11/14/2023 0800   K 4.1 11/14/2023 0800   CL 101 11/14/2023 0800   CO2 22 11/14/2023 0800   GLUCOSE 151 (H) 11/14/2023 0800   GLUCOSE 211 (H) 08/18/2022 0001   BUN 21 11/14/2023 0800   CREATININE 1.34 (H) 11/14/2023 0800   CREATININE 1.16 04/29/2016 0853   CALCIUM  9.3 11/14/2023 0800   GFRNONAA 42 (L) 08/18/2022 0001   GFRNONAA 64 04/29/2016 0853   GFRAA 64 09/16/2020 1206   GFRAA 74 04/29/2016 0853   Lab Results  Component Value Date   HGBA1C 7.9 (A) 02/16/2024   HGBA1C 5.6 07/15/2014   Lab Results  Component Value Date   INSULIN  13.9 11/14/2023   INSULIN  22.9 12/28/2016   Lab Results  Component Value Date   TSH 1.430 11/14/2023   CBC    Component Value Date/Time   WBC 7.4 11/14/2023 0800   WBC 13.1 (H) 08/18/2022 0001   RBC 4.92 11/14/2023 0800   RBC 5.76 08/18/2022 0001   HGB 14.4 11/14/2023 0800   HCT 44.2 11/14/2023 0800   PLT 268 11/14/2023 0800   MCV 90 11/14/2023 0800   MCH 29.3 11/14/2023 0800   MCH 28.1 08/18/2022 0001   MCHC 32.6 11/14/2023 0800   MCHC 33.0 08/18/2022 0001   RDW 13.9 11/14/2023 0800   Iron  Studies No results found for: IRON, TIBC, FERRITIN, IRONPCTSAT Lipid Panel     Component Value Date/Time   CHOL 95 (L) 11/14/2023 0800   TRIG 92 11/14/2023 0800   HDL 37 (L) 11/14/2023 0800   CHOLHDL 3.0 04/18/2022 1020   CHOLHDL 5.2 (H) 01/26/2016 1111   VLDL 46 (H) 01/26/2016 1111   LDLCALC 40 11/14/2023 0800   LDLDIRECT 148 (H) 11/15/2007 1709   Hepatic Function Panel     Component Value Date/Time   PROT 6.1 11/14/2023 0800   ALBUMIN 4.3 11/14/2023 0800   AST 24 11/14/2023 0800   ALT 45 (H) 11/14/2023 0800   ALKPHOS 63 11/14/2023 0800   BILITOT 0.6 11/14/2023 0800      Component Value Date/Time   TSH 1.430 11/14/2023 0800    Nutritional Lab Results  Component Value Date   VD25OH 68.2 11/14/2023   VD25OH 63.9 02/08/2023   VD25OH 65.0 09/21/2022     Assessment and Plan Assessment & Plan Obesity Obesity management is ongoing with a low carb eating plan, adhered to 85% of the time, and physical activity of walking 15 minutes six days a week. He has lost 10 pounds in the last month, indicating progress. - Continue low carb eating plan - Continue walking 15 minutes six days a week - Encourage hydration due to low carb diet  Type 2 diabetes mellitus Type 2 diabetes is managed with Ozempic  at 38 clicks on a 2 mg pen. No recent blood sugar issues reported. Insurance coverage has reduced the cost of Ozempic . - Ensure Ozempic  prescription is for 2 mg pen - Encourage hydration to prevent dehydration - Order labs for diabetes monitoring - Bring in patient assistance paperwork for Ozempic   Vitamin D  deficiency Vitamin D  deficiency is managed with supplementation of 50,000 IU every 14 days. - Refill vitamin D  50,000 IU every 14 days - Order labs to monitor vitamin D  levels  HTN  Due for labs, controlled today - Check labs - Continue diet, exercise and weight loss as discussed today as an important part of the treatment plan  HLD Due for labs - Check labs - Continue diet, exercise and weight loss as discussed today as an important part of the treatment plan  I personally spent a total of 52 minutes in the care of the patient today including preparing to see the patient, getting/reviewing separately obtained history, performing a medically appropriate exam/evaluation, counseling and educating, placing orders, documenting clinical information in the EHR, and nutritional counseling.    He was informed of the importance of frequent follow up visits to maximize his success with intensive lifestyle modifications for his multiple health conditions.    Louann Penton, MD

## 2024-04-07 ENCOUNTER — Other Ambulatory Visit: Payer: Self-pay | Admitting: Family Medicine

## 2024-04-07 DIAGNOSIS — F3289 Other specified depressive episodes: Secondary | ICD-10-CM

## 2024-04-16 DIAGNOSIS — D485 Neoplasm of uncertain behavior of skin: Secondary | ICD-10-CM | POA: Diagnosis not present

## 2024-04-16 DIAGNOSIS — L738 Other specified follicular disorders: Secondary | ICD-10-CM | POA: Diagnosis not present

## 2024-04-16 DIAGNOSIS — Z85828 Personal history of other malignant neoplasm of skin: Secondary | ICD-10-CM | POA: Diagnosis not present

## 2024-04-16 DIAGNOSIS — D1809 Hemangioma of other sites: Secondary | ICD-10-CM | POA: Diagnosis not present

## 2024-04-17 ENCOUNTER — Encounter: Payer: Self-pay | Admitting: Orthopedic Surgery

## 2024-04-17 ENCOUNTER — Ambulatory Visit: Admitting: Orthopedic Surgery

## 2024-04-17 VITALS — BP 133/94 | HR 69 | Ht 73.0 in | Wt 299.0 lb

## 2024-04-17 DIAGNOSIS — M67441 Ganglion, right hand: Secondary | ICD-10-CM | POA: Diagnosis not present
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
## 2024-04-17 NOTE — Patient Instructions (Signed)
 Please stop Plavix  3 days before surgery - last dose 9/25  Please stop Ozempic  7 days prior to surgery - last dose 9/21    Preoperative Instructions  Your surgery will be at [facility], scheduled with Dr Oneil Horde   The hospital will contact you with a preoperative appointment to discuss Anesthesia. The phone number is [phone]   Please bring your medications with you for the appointment.  They will tell you the arrival time and medication instructions when you have your preoperative evaluation.  Do not wear nail polish the day of your surgery and if you take Phentermine you need to stop this medication ONE WEEK prior to your surgery.    If you take an blood thinning medication, we will need to stop this prior to surgery.  Typically, we stop this medicine at least 5 days prior to surgery.  We will need to confirm this with the doctor who prescribes this medication.  If you are taking medications or an injection for diabetes, or for weight management, this medicine will need to be stopped at least 7 days prior to surgery.     Surgery will be scheduled for 04/29/24 pending authorization by your insurance company.   Pain medicine policy:  Per [facility] clinic policy, our goal is ensure optimal postoperative pain control with a multimodal pain management strategy.   For all [facility] patients, our goal is to wean post-operative narcotic medications by 6 weeks post-operatively.   If this is not possible due to utilization of pain medication prior to surgery, your [facility] doctor will support your acute post-operative pain control for the first 6 weeks postoperatively, with a plan to transition you back to your primary pain team following that.   [name] will work to ensure a Therapist, occupational.

## 2024-04-18 ENCOUNTER — Other Ambulatory Visit: Payer: Self-pay

## 2024-04-18 DIAGNOSIS — M67441 Ganglion, right hand: Secondary | ICD-10-CM

## 2024-04-24 NOTE — Patient Instructions (Signed)
 [name] [name]  04/24/2024     @PREFPERIOPPHARMACY @   Your procedure is scheduled on  04/29/2024.   Report to [name] at  0700 A.M.    Call this number if you have problems the morning of surgery:  [phone]  If you experience any cold or flu symptoms such as cough, fever, chills, shortness of breath, etc. between now and your scheduled surgery, please notify us  at the above number.   Remember:               Your last dose of semaglutide  should have been on 04/21/2024.         Your last dose of plavix  should be on 04/23/2024.        Your last dose of jardiance  should be on 04/25/2024.    Do not eat after midnight.        DO NOT take any medications for diabetes the morning of your procedure.    You may drink clear liquids until 0500 am on 04/29/2024.       Clear liquids allowed are:                    Water , Juice (No red color; non-citric and without pulp; diabetics please choose diet or no sugar options), Carbonated beverages (diabetics please choose diet or no sugar options), Clear Tea (No creamer, milk, or cream, including half & half and powdered creamer), Black Coffee Only (No creamer, milk or cream, including half & half and powdered creamer), and Clear Sports drink (No red color; diabetics please choose diet or no sugar options)           At 0500 am on 04/29/2024 Drink 1-12 oz g zero or 12 oz of water .       You can have nothing else to drink after this.    Take these medicines the morning of surgery with A SIP OF WATER         bupropion , escitalopram , isosorbide , metoprolol , prednisone .     Do not wear jewelry, make-up or nail polish, including gel polish,  artificial nails, or any other type of covering on natural nails (fingers and  toes).  Do not wear lotions, powders, or perfumes, or deodorant.  Do not shave 48 hours prior to surgery.  Men may shave face and neck.  Do not bring valuables to the hospital.  [facility] is not responsible  for any belongings or valuables.  Contacts, dentures or bridgework may not be worn into surgery.  Leave your suitcase in the car.  After surgery it may be brought to your room.  For patients admitted to the hospital, discharge time will be determined by your treatment team.  Patients discharged the day of surgery will not be allowed to drive home and must have someone with them for 24 hours.    Special instructions:   DO NOT smoke tobacco or vape or smoke for 24 hours before your procedure.  Please read over the following fact sheets that you were given. Coughing and Deep Breathing, Surgical Site Infection Prevention, Anesthesia Post-op Instructions, and Care and Recovery After Surgery      Incision Care, Adult An incision is a cut that a doctor makes in your skin for surgery. Most times, these cuts are closed after surgery. Your cut from surgery may be closed with: Stitches (sutures). Staples. Skin glue. Skin tape (adhesive) strips. You may need to go back to your  doctor to have stitches or staples taken out. This may happen many days or many weeks after your surgery. You need to take good care of your cut so it does not get infected. Follow instructions from your doctor about how to care for your cut. Supplies needed: Soap and water . A clean hand towel. Wound cleanser. A clean bandage (dressing), if needed. Cream or ointment, if told by your doctor. Clean gauze. How to care for your cut from surgery Cleaning your cut Ask your doctor how to clean your cut. You may need to: Wear medical gloves. Use mild soap and water , or a wound cleanser. Use a clean gauze to pat your cut dry after you clean it. Changing your bandage Wash your hands with soap and water  for at least 20 seconds before and after you change your bandage. If you cannot use soap and water , use hand sanitizer. Do not usedisinfectants or antiseptics, such as rubbing alcohol, to clean your wound unless told by your  doctor. Change your bandage as told by your doctor. Leavestitches or skin glue in place for at least 2 weeks. Leave tape strips alone unless you are told to take them off. You may trim the edges of the tape strips if they curl up. Put a cream or ointment on your cut. Do this only as told. Cover your cut with a clean bandage. Ask your doctor when you can leave your cut uncovered. Checking for infection Check your cut area every day for signs of infection. Check for: More redness, swelling, or pain. More fluid or blood. New warmth. Hardness or a new rash around the incision. Pus or a bad smell.  Follow these instructions at home Medicines Take over-the-counter and prescription medicines only as told by your doctor. If you were prescribed an antibiotic medicine, cream, or ointment, use it as told by your doctor. Do not stop using the antibiotic even if you start to feel better. Eating and drinking Eat foods that have a lot of certain nutrients, such as protein, vitamin A, and vitamin C. These foods help your cut heal. Foods rich in protein include meat, fish, eggs, dairy, beans, nuts, and protein drinks. Foods rich in vitamin A include carrots and dark green, leafy vegetables. Foods rich in vitamin C include citrus fruits, tomatoes, broccoli, and peppers. Drink enough fluid to keep your pee (urine) pale yellow. General instructions  Do not take baths, swim, or use a hot tub. Ask your doctor about taking showers or sponge baths. Limit movement around your cut. This helps with healing. Try not to strain, lift, or exercise for the first 2 weeks, or for as long as told by your doctor. Return to your normal activities as told by your doctor. Ask your doctor what activities are safe for you. Do not scratch, scrub, or pick at your cut. Keep it covered as told by your doctor. Protect your cut from the sun when you are outside for the first 6 months, or for as long as told by your doctor. Cover  up the scar area or put on sunscreen that has an SPF of at least 30. Do not use any products that contain nicotine or tobacco, such as cigarettes, e-cigarettes, and chewing tobacco. These can delay cut healing. If you need help quitting, ask your doctor. Keep all follow-up visits. Contact a doctor if: You have any of these signs of infection around your cut: More redness, swelling, or pain. More fluid or blood. New warmth or hardness. Pus  or a bad smell. A new rash. You have a fever. You feel like you may vomit (nauseous). You vomit. You are dizzy. Your stitches, staples, skin glue, or tape strips come undone. Your cut gets bigger. You have a fever. Get help right away if: Your cut bleeds through your bandage, and bleeding does not stop with gentle pressure. Your cut opens up and comes apart. These symptoms may be an emergency. Do not wait to see if the symptoms will go away. Get medical help right away. Call your local emergency services (911 in the [place] Do not drive yourself to the hospital. Summary Follow instructions from your doctor about how to care for your cut. Wash your hands with soap and water  for at least 20 seconds before and after you change your bandage. If you cannot use soap and water , use hand sanitizer. Check your cut area every day for signs of infection. Keep all follow-up visits. This information is not intended to replace advice given to you by your health care provider. Make sure you discuss any questions you have with your health care provider. Document Revised: 10/19/2020 Document Reviewed: 10/19/2020 Elsevier Patient Education  2024 Else[employer].General Anesthesia, Adult, Care After The following information offers guidance on how to care for yourself after your procedure. Your health care provider may also give you more specific instructions. If you have problems or questions, contact your health care provider. What can I expect after the procedure? After  the procedure, it is common for people to: Have pain or discomfort at the IV site. Have nausea or vomiting. Have a sore throat or hoarseness. Have trouble concentrating. Feel cold or chills. Feel weak, sleepy, or tired (fatigue). Have soreness and body aches. These can affect parts of the body that were not involved in surgery. Follow these instructions at home: For the time period you were told by your health care provider:  Rest. Do not participate in activities where you could fall or become injured. Do not drive or use machinery. Do not drink alcohol. Do not take sleeping pills or medicines that cause drowsiness. Do not make important decisions or sign legal documents. Do not take care of children on your own. General instructions Drink enough fluid to keep your urine pale yellow. If you have sleep apnea, surgery and certain medicines can increase your risk for breathing problems. Follow instructions from your health care provider about wearing your sleep device: Anytime you are sleeping, including during daytime naps. While taking prescription pain medicines, sleeping medicines, or medicines that make you drowsy. Return to your normal activities as told by your health care provider. Ask your health care provider what activities are safe for you. Take over-the-counter and prescription medicines only as told by your health care provider. Do not use any products that contain nicotine or tobacco. These products include cigarettes, chewing tobacco, and vaping devices, such as e-cigarettes. These can delay incision healing after surgery. If you need help quitting, ask your health care provider. Contact a health care provider if: You have nausea or vomiting that does not get better with medicine. You vomit every time you eat or drink. You have pain that does not get better with medicine. You cannot urinate or have bloody urine. You develop a skin rash. You have a fever. Get help right  away if: You have trouble breathing. You have chest pain. You vomit blood. These symptoms may be an emergency. Get help right away. Call 911. Do not wait to see if  the symptoms will go away. Do not drive yourself to the hospital. Summary After the procedure, it is common to have a sore throat, hoarseness, nausea, vomiting, or to feel weak, sleepy, or fatigue. For the time period you were told by your health care provider, do not drive or use machinery. Get help right away if you have difficulty breathing, have chest pain, or vomit blood. These symptoms may be an emergency. This information is not intended to replace advice given to you by your health care provider. Make sure you discuss any questions you have with your health care provider. Document Revised: 10/15/2021 Document Reviewed: 10/15/2021 Elsevier Patient Education  2024 Else[employer] Inc.How to Use Chlorhexidine  at Home in the Shower Chlorhexidine  gluconate (CHG) is a germ-killing (antiseptic) wash that's used to clean the skin. It can get rid of the germs that normally live on the skin and can keep them away for about 24 hours. If you're having surgery, you may be told to shower with CHG at home the night before surgery. This can help lower your risk for infection. To use CHG wash in the shower, follow the steps below. Supplies needed: CHG body wash. Clean washcloth. Clean towel. How to use CHG in the shower Follow these steps unless you're told to use CHG in a different way: Start the shower. Use your normal soap and shampoo to wash your face and hair. Turn off the shower or move out of the shower stream. Pour CHG onto a clean washcloth. Do not use any type of brush or rough sponge. Start at your neck, washing your body down to your toes. Make sure you: Wash the part of your body where the surgery will be done for at least 1 minute. Do not scrub. Do not use CHG on your head or face unless your health care provider tells you to.  If it gets into your ears or eyes, rinse them well with water . Do not wash your genitals with CHG. Wash your back and under your arms. Make sure to wash skin folds. Let the CHG sit on your skin for 1-2 minutes or as long as told. Rinse your entire body in the shower, including all body creases and folds. Turn off the shower. Dry off with a clean towel. Do not put anything on your skin afterward, such as powder, lotion, or perfume. Put on clean clothes or pajamas. If it's the night before surgery, sleep in clean sheets. General tips Use CHG only as told, and follow the instructions on the label. Use the full amount of CHG as told. This is often one bottle. Do not smoke and stay away from flames after using CHG. Your skin may feel sticky after using CHG. This is normal. The sticky feeling will go away as the CHG dries. Do not use CHG: If you have a chlorhexidine  allergy or have reacted to chlorhexidine  in the past. On open wounds or areas of skin that have broken skin, cuts, or scrapes. On babies younger than 28 months of age. Contact a health care provider if: You have questions about using CHG. Your skin gets irritated or itchy. You have a rash after using CHG. You swallow any CHG. Call your local poison control center [phone] in the [place] Your eyes itch badly, or they become very red or swollen. Your hearing changes. You have trouble seeing. If you can't reach your provider, go to an urgent care or emergency room. Do not drive yourself. Get help right away if: You  have swelling or tingling in your mouth or throat. You make high-pitched whistling sounds when you breathe, most often when you breathe out (wheeze). You have trouble breathing. These symptoms may be an emergency. Call 911 right away. Do not wait to see if the symptoms will go away. Do not drive yourself to the hospital. This information is not intended to replace advice given to you by your health care provider.  Make sure you discuss any questions you have with your health care provider. Document Revised: 01/31/2023 Document Reviewed: 01/27/2022 Elsevier Patient Education  2024 Else[employer] Inc.How to Use an Incentive Spirometer An incentive spirometer is a tool that measures how well you are filling your lungs with each breath. Learning to take long, deep breaths using this tool can help you keep your lungs clear and active. This may help to reverse or lessen your chance of developing breathing (pulmonary) problems, especially infection. You may be asked to use a spirometer: After a surgery. If you have a lung problem or a history of smoking. After a long period of time when you have been unable to move or be active. If the spirometer includes an indicator to show the highest number that you have reached, your health care provider or respiratory therapist will help you set a goal. Keep a log of your progress as told by your health care provider. What are the risks? Breathing too quickly may cause dizziness or cause you to pass out. Take your time so you do not get dizzy or light-headed. If you are in pain, you may need to take pain medicine before doing incentive spirometry. It is harder to take a deep breath if you are having pain. How to use your incentive spirometer  Sit up on the edge of your bed or on a chair. Hold the incentive spirometer so that it is in an upright position. Before you use the spirometer, breathe out normally. Place the mouthpiece in your mouth. Make sure your lips are closed tightly around it. Breathe in slowly and as deeply as you can through your mouth, causing the piston or the ball to rise toward the top of the chamber. Hold your breath for 3-5 seconds, or for as long as possible. If the spirometer includes a coach indicator, use this to guide you in breathing. Slow down your breathing if the indicator goes above the marked areas. Remove the mouthpiece from your mouth and  breathe out normally. The piston or ball will return to the bottom of the chamber. Rest for a few seconds, then repeat the steps 10 or more times. Take your time and take a few normal breaths between deep breaths so that you do not get dizzy or light-headed. Do this every 1-2 hours when you are awake. If the spirometer includes a goal marker to show the highest number you have reached (best effort), use this as a goal to work toward during each repetition. After each set of 10 deep breaths, cough a few times. This will help to make sure that your lungs are clear. If you have an incision on your chest or abdomen from surgery, place a pillow or a rolled-up towel firmly against the incision when you cough. This can help to reduce pain while taking deep breaths and coughing. General tips When you are able to get out of bed: Walk around often. Continue to take deep breaths and cough in order to clear your lungs. Keep using the incentive spirometer until your health care provider  says it is okay to stop using it. If you have been in the hospital, you may be told to keep using the spirometer at home. Contact a health care provider if: You are having difficulty using the spirometer. You have trouble using the spirometer as often as instructed. Your pain medicine is not giving enough relief for you to use the spirometer as told. You have a fever. Get help right away if: You develop shortness of breath. You develop a cough with bloody mucus from the lungs. You have fluid or blood coming from an incision site after you cough. Summary An incentive spirometer is a tool that can help you learn to take long, deep breaths to keep your lungs clear and active. You may be asked to use a spirometer after a surgery, if you have a lung problem or a history of smoking, or if you have been inactive for a long period of time. Use your incentive spirometer as instructed every 1-2 hours while you are awake. If you  have an incision on your chest or abdomen, place a pillow or a rolled-up towel firmly against your incision when you cough. This will help to reduce pain. Get help right away if you have shortness of breath, you cough up bloody mucus, or blood comes from your incision when you cough. This information is not intended to replace advice given to you by your health care provider. Make sure you discuss any questions you have with your health care provider. Document Revised: 05/26/2023 Document Reviewed: 05/26/2023 Elsevier Patient Education  2024 [employer].

## 2024-04-25 ENCOUNTER — Encounter (HOSPITAL_COMMUNITY)
Admission: RE | Admit: 2024-04-25 | Discharge: 2024-04-25 | Disposition: A | Discharge status: Home / Self Care | Source: Ambulatory Visit | Attending: Orthopedic Surgery | Admitting: Orthopedic Surgery

## 2024-04-25 VITALS — HR 69 | Resp 18 | Ht 73.0 in | Wt 299.0 lb

## 2024-04-25 DIAGNOSIS — I10 Essential (primary) hypertension: Secondary | ICD-10-CM | POA: Insufficient documentation

## 2024-04-25 DIAGNOSIS — Z01818 Encounter for other preprocedural examination: Secondary | ICD-10-CM | POA: Insufficient documentation

## 2024-04-25 DIAGNOSIS — R001 Bradycardia, unspecified: Secondary | ICD-10-CM | POA: Insufficient documentation

## 2024-04-25 DIAGNOSIS — Z6839 Body mass index (BMI) 39.0-39.9, adult: Secondary | ICD-10-CM

## 2024-04-25 DIAGNOSIS — E1159 Type 2 diabetes mellitus with other circulatory complications: Secondary | ICD-10-CM | POA: Insufficient documentation

## 2024-04-25 DIAGNOSIS — Z6841 Body Mass Index (BMI) 40.0 and over, adult: Secondary | ICD-10-CM | POA: Diagnosis not present

## 2024-04-25 LAB — CBC WITH DIFFERENTIAL/PLATELET
Abs Immature Granulocytes: 0.01 K/uL (ref 0.00–0.07)
Basophils Absolute: 0 K/uL (ref 0.0–0.1)
Basophils Relative: 1 %
Eosinophils Absolute: 0.3 K/uL (ref 0.0–0.5)
Eosinophils Relative: 5 %
HCT: 44.3 % (ref 39.0–52.0)
Hemoglobin: 14.3 g/dL (ref 13.0–17.0)
Immature Granulocytes: 0 %
Lymphocytes Relative: 21 %
Lymphs Abs: 1.2 K/uL (ref 0.7–4.0)
MCH: 29 pg (ref 26.0–34.0)
MCHC: 32.3 g/dL (ref 30.0–36.0)
MCV: 89.9 fL (ref 80.0–100.0)
Monocytes Absolute: 0.4 K/uL (ref 0.1–1.0)
Monocytes Relative: 7 %
Neutro Abs: 3.6 K/uL (ref 1.7–7.7)
Neutrophils Relative %: 66 %
Platelets: 246 K/uL (ref 150–400)
RBC: 4.93 MIL/uL (ref 4.22–5.81)
RDW: 15.4 % (ref 11.5–15.5)
WBC: 5.5 K/uL (ref 4.0–10.5)
nRBC: 0 % (ref 0.0–0.2)

## 2024-04-25 LAB — BASIC METABOLIC PANEL WITH GFR
Anion gap: 9 (ref 5–15)
BUN: 20 mg/dL (ref 8–23)
CO2: 23 mmol/L (ref 22–32)
Calcium: 8.9 mg/dL (ref 8.9–10.3)
Chloride: 106 mmol/L (ref 98–111)
Creatinine, Ser: 1.21 mg/dL (ref 0.61–1.24)
GFR, Estimated: >60 mL/min (ref >60)
Glucose, Bld: 159 mg/dL — ABNORMAL HIGH (ref 70–99)
Potassium: 3.9 mmol/L (ref 3.5–5.1)
Sodium: 138 mmol/L (ref 135–145)

## 2024-04-25 LAB — HEMOGLOBIN A1C
Hgb A1c MFr Bld: 7.3 % — ABNORMAL HIGH (ref 4.8–5.6)
Mean Plasma Glucose: 162.81 mg/dL

## 2024-04-27 ENCOUNTER — Other Ambulatory Visit: Payer: Self-pay | Admitting: Student

## 2024-04-27 DIAGNOSIS — F3289 Other specified depressive episodes: Secondary | ICD-10-CM

## 2024-04-28 ENCOUNTER — Encounter (INDEPENDENT_AMBULATORY_CARE_PROVIDER_SITE_OTHER): Payer: Self-pay | Admitting: Family Medicine

## 2024-04-29 ENCOUNTER — Other Ambulatory Visit: Payer: Self-pay

## 2024-04-29 ENCOUNTER — Encounter (HOSPITAL_COMMUNITY)
Admission: RE | Disposition: A | Discharge status: Home / Self Care | Payer: Self-pay | Source: Home / Self Care | Attending: Orthopedic Surgery

## 2024-04-29 ENCOUNTER — Ambulatory Visit (HOSPITAL_COMMUNITY): Payer: Self-pay | Admitting: Certified Registered Nurse Anesthetist

## 2024-04-29 ENCOUNTER — Encounter (HOSPITAL_COMMUNITY): Payer: Self-pay | Admitting: Orthopedic Surgery

## 2024-04-29 ENCOUNTER — Ambulatory Visit (HOSPITAL_COMMUNITY)
Admission: RE | Admit: 2024-04-29 | Discharge: 2024-04-29 | Disposition: A | Discharge status: Home / Self Care | Attending: Orthopedic Surgery | Admitting: Orthopedic Surgery

## 2024-04-29 ENCOUNTER — Ambulatory Visit (INDEPENDENT_AMBULATORY_CARE_PROVIDER_SITE_OTHER): Admitting: Family Medicine

## 2024-04-29 DIAGNOSIS — M1990 Unspecified osteoarthritis, unspecified site: Secondary | ICD-10-CM | POA: Insufficient documentation

## 2024-04-29 DIAGNOSIS — K219 Gastro-esophageal reflux disease without esophagitis: Secondary | ICD-10-CM | POA: Insufficient documentation

## 2024-04-29 DIAGNOSIS — Z7985 Long-term (current) use of injectable non-insulin antidiabetic drugs: Secondary | ICD-10-CM | POA: Diagnosis not present

## 2024-04-29 DIAGNOSIS — Z79899 Other long term (current) drug therapy: Secondary | ICD-10-CM | POA: Diagnosis not present

## 2024-04-29 DIAGNOSIS — I2510 Atherosclerotic heart disease of native coronary artery without angina pectoris: Secondary | ICD-10-CM | POA: Diagnosis not present

## 2024-04-29 DIAGNOSIS — Z6839 Body mass index (BMI) 39.0-39.9, adult: Secondary | ICD-10-CM | POA: Diagnosis not present

## 2024-04-29 DIAGNOSIS — J45909 Unspecified asthma, uncomplicated: Secondary | ICD-10-CM | POA: Insufficient documentation

## 2024-04-29 DIAGNOSIS — I4891 Unspecified atrial fibrillation: Secondary | ICD-10-CM | POA: Insufficient documentation

## 2024-04-29 DIAGNOSIS — I5022 Chronic systolic (congestive) heart failure: Secondary | ICD-10-CM | POA: Diagnosis not present

## 2024-04-29 DIAGNOSIS — Z7984 Long term (current) use of oral hypoglycemic drugs: Secondary | ICD-10-CM | POA: Insufficient documentation

## 2024-04-29 DIAGNOSIS — Z7952 Long term (current) use of systemic steroids: Secondary | ICD-10-CM | POA: Insufficient documentation

## 2024-04-29 DIAGNOSIS — G4733 Obstructive sleep apnea (adult) (pediatric): Secondary | ICD-10-CM | POA: Diagnosis not present

## 2024-04-29 DIAGNOSIS — Z955 Presence of coronary angioplasty implant and graft: Secondary | ICD-10-CM | POA: Insufficient documentation

## 2024-04-29 DIAGNOSIS — M67441 Ganglion, right hand: Secondary | ICD-10-CM | POA: Insufficient documentation

## 2024-04-29 DIAGNOSIS — E1151 Type 2 diabetes mellitus with diabetic peripheral angiopathy without gangrene: Secondary | ICD-10-CM | POA: Diagnosis not present

## 2024-04-29 DIAGNOSIS — E785 Hyperlipidemia, unspecified: Secondary | ICD-10-CM | POA: Insufficient documentation

## 2024-04-29 DIAGNOSIS — E66813 Obesity, class 3: Secondary | ICD-10-CM | POA: Insufficient documentation

## 2024-04-29 DIAGNOSIS — D759 Disease of blood and blood-forming organs, unspecified: Secondary | ICD-10-CM | POA: Insufficient documentation

## 2024-04-29 DIAGNOSIS — R0602 Shortness of breath: Secondary | ICD-10-CM | POA: Insufficient documentation

## 2024-04-29 DIAGNOSIS — R519 Headache, unspecified: Secondary | ICD-10-CM | POA: Insufficient documentation

## 2024-04-29 DIAGNOSIS — Z8546 Personal history of malignant neoplasm of prostate: Secondary | ICD-10-CM | POA: Insufficient documentation

## 2024-04-29 DIAGNOSIS — I110 Hypertensive heart disease with heart failure: Secondary | ICD-10-CM | POA: Diagnosis not present

## 2024-04-29 DIAGNOSIS — F419 Anxiety disorder, unspecified: Secondary | ICD-10-CM | POA: Diagnosis not present

## 2024-04-29 DIAGNOSIS — Z8673 Personal history of transient ischemic attack (TIA), and cerebral infarction without residual deficits: Secondary | ICD-10-CM | POA: Insufficient documentation

## 2024-04-29 DIAGNOSIS — Z7902 Long term (current) use of antithrombotics/antiplatelets: Secondary | ICD-10-CM | POA: Diagnosis not present

## 2024-04-29 DIAGNOSIS — F32A Depression, unspecified: Secondary | ICD-10-CM | POA: Insufficient documentation

## 2024-04-29 HISTORY — PX: CYST REMOVAL HAND: SHX6279

## 2024-04-29 LAB — GLUCOSE, CAPILLARY: Glucose-Capillary: 165 mg/dL — ABNORMAL HIGH (ref 70–99)

## 2024-04-29 SURGERY — REMOVAL, CYST, HAND
Anesthesia: General | Site: Ring Finger | Laterality: Right

## 2024-04-29 MED ORDER — BUPIVACAINE HCL (PF) 0.5 % IJ SOLN
INTRAMUSCULAR | Status: AC
  Filled 2024-04-29: qty 30

## 2024-04-29 MED ORDER — OXYCODONE HCL 5 MG/5ML PO SOLN: 5.0000 mg | Freq: Once | ORAL | Status: DC | PRN

## 2024-04-29 MED ORDER — ONDANSETRON HCL 4 MG PO TABS: 4.0000 mg | ORAL_TABLET | Freq: Three times a day (TID) | ORAL | 0 refills | Status: AC | PRN

## 2024-04-29 MED ORDER — SODIUM CHLORIDE 0.9 % IV SOLN: 12.5000 mg | INTRAVENOUS | Status: DC | PRN

## 2024-04-29 MED ORDER — 0.9 % SODIUM CHLORIDE (POUR BTL) OPTIME
TOPICAL | Status: DC | PRN
  Administered 2024-04-29: 1000 mL

## 2024-04-29 MED ORDER — MIDAZOLAM HCL 2 MG/2ML IJ SOLN
INTRAMUSCULAR | Status: AC
  Filled 2024-04-29: qty 2

## 2024-04-29 MED ORDER — CEFAZOLIN SODIUM-DEXTROSE 3-4 GM/150ML-% IV SOLN
3.0000 g | INTRAVENOUS | Status: AC
  Administered 2024-04-29: 3 g via INTRAVENOUS

## 2024-04-29 MED ORDER — LACTATED RINGERS IV SOLN: INTRAVENOUS | Status: DC

## 2024-04-29 MED ORDER — CHLORHEXIDINE GLUCONATE 0.12 % MT SOLN: 15.0000 mL | Freq: Once | OROMUCOSAL | Status: AC

## 2024-04-29 MED ORDER — OXYCODONE HCL 5 MG PO TABS: 5.0000 mg | ORAL_TABLET | Freq: Once | ORAL | Status: DC | PRN

## 2024-04-29 MED ORDER — ACETAMINOPHEN 160 MG/5ML PO SOLN
960.0000 mg | Freq: Once | ORAL | Status: DC
  Filled 2024-04-29: qty 30

## 2024-04-29 MED ORDER — HYDROCODONE-ACETAMINOPHEN 5-325 MG PO TABS: 1.0000 | ORAL_TABLET | Freq: Four times a day (QID) | ORAL | 0 refills | Status: AC | PRN

## 2024-04-29 MED ORDER — MIDAZOLAM HCL 5 MG/5ML IJ SOLN
INTRAMUSCULAR | Status: DC | PRN
  Administered 2024-04-29: 2 mg via INTRAVENOUS

## 2024-04-29 MED ORDER — PROPOFOL 10 MG/ML IV BOLUS
INTRAVENOUS | Status: AC
  Filled 2024-04-29: qty 20

## 2024-04-29 MED ORDER — CHLORHEXIDINE GLUCONATE 0.12 % MT SOLN
OROMUCOSAL | Status: AC
  Administered 2024-04-29: 15 mL via OROMUCOSAL
  Filled 2024-04-29: qty 15

## 2024-04-29 MED ORDER — FENTANYL CITRATE (PF) 100 MCG/2ML IJ SOLN
INTRAMUSCULAR | Status: AC
  Filled 2024-04-29: qty 2

## 2024-04-29 MED ORDER — LIDOCAINE HCL 1 % IJ SOLN
INTRAMUSCULAR | Status: DC | PRN
  Administered 2024-04-29 (×2): 10 mL via INTRADERMAL

## 2024-04-29 MED ORDER — PROPOFOL 10 MG/ML IV BOLUS
INTRAVENOUS | Status: DC | PRN
  Administered 2024-04-29: 50 mg via INTRAVENOUS

## 2024-04-29 MED ORDER — LIDOCAINE 2% (20 MG/ML) 5 ML SYRINGE
INTRAMUSCULAR | Status: AC
  Filled 2024-04-29: qty 5

## 2024-04-29 MED ORDER — FENTANYL CITRATE PF 50 MCG/ML IJ SOSY: 25.0000 ug | PREFILLED_SYRINGE | INTRAMUSCULAR | Status: DC | PRN

## 2024-04-29 MED ORDER — LIDOCAINE HCL (CARDIAC) PF 100 MG/5ML IV SOSY
PREFILLED_SYRINGE | INTRAVENOUS | Status: DC | PRN
Start: 2024-04-29 — End: 2024-04-29
  Administered 2024-04-29: 50 mg via INTRAVENOUS

## 2024-04-29 MED ORDER — LIDOCAINE HCL (PF) 1 % IJ SOLN
INTRAMUSCULAR | Status: AC
  Filled 2024-04-29: qty 30

## 2024-04-29 MED ORDER — CEFAZOLIN SODIUM-DEXTROSE 3-4 GM/150ML-% IV SOLN
INTRAVENOUS | Status: AC
  Filled 2024-04-29: qty 150

## 2024-04-29 MED ORDER — FENTANYL CITRATE (PF) 100 MCG/2ML IJ SOLN
INTRAMUSCULAR | Status: DC | PRN
  Administered 2024-04-29 (×2): 50 ug via INTRAVENOUS

## 2024-04-29 MED ORDER — ACETAMINOPHEN 500 MG PO TABS: 1000.0000 mg | ORAL_TABLET | Freq: Once | ORAL | Status: DC

## 2024-04-29 SURGICAL SUPPLY — 32 items
BANDAGE ESMARK 4X12 BL STRL LF (DISPOSABLE) ×1 IMPLANT
BLADE SURG 15 STRL LF DISP TIS (BLADE) ×1 IMPLANT
BNDG GAUZE DERMACEA FLUFF 4 (GAUZE/BANDAGES/DRESSINGS) IMPLANT
BNDG GAUZE ROLL STR 2.25X3YD (GAUZE/BANDAGES/DRESSINGS) IMPLANT
CHLORAPREP W/TINT 26 (MISCELLANEOUS) ×1 IMPLANT
CLOTH BEACON ORANGE TIMEOUT ST (SAFETY) ×1 IMPLANT
CORD BIPOLAR FORCEPS 12FT (ELECTRODE) ×1 IMPLANT
COVER LIGHT HANDLE STERIS (MISCELLANEOUS) ×2 IMPLANT
CUFF TOURN SGL QUICK 18X4 (TOURNIQUET CUFF) ×1 IMPLANT
DRAPE HALF SHEET 40X57 (DRAPES) ×1 IMPLANT
DRSG TEGADERM 2-3/8X2-3/4 SM (GAUZE/BANDAGES/DRESSINGS) IMPLANT
GAUZE SPONGE 4X4 12PLY STRL (GAUZE/BANDAGES/DRESSINGS) ×1 IMPLANT
GAUZE XEROFORM 1X8 LF (GAUZE/BANDAGES/DRESSINGS) IMPLANT
GLOVE BIO SURGEON STRL SZ7 (GLOVE) IMPLANT
GLOVE BIO SURGEON STRL SZ8 (GLOVE) ×2 IMPLANT
GLOVE BIOGEL PI IND STRL 7.0 (GLOVE) ×2 IMPLANT
GLOVE BIOGEL PI IND STRL 8 (GLOVE) ×1 IMPLANT
GOWN STRL REUS W/TWL LRG LVL3 (GOWN DISPOSABLE) ×1 IMPLANT
GOWN STRL REUS W/TWL XL LVL3 (GOWN DISPOSABLE) ×1 IMPLANT
KIT TURNOVER KIT A (KITS) ×1 IMPLANT
MANIFOLD NEPTUNE II (INSTRUMENTS) ×1 IMPLANT
NDL HYPO 21X1.5 SAFETY (NEEDLE) ×1 IMPLANT
NEEDLE HYPO 21X1.5 SAFETY (NEEDLE) ×1 IMPLANT
NS IRRIG 1000ML POUR BTL (IV SOLUTION) ×1 IMPLANT
PACK BASIC LIMB (CUSTOM PROCEDURE TRAY) ×1 IMPLANT
PAD ARMBOARD POSITIONER FOAM (MISCELLANEOUS) ×1 IMPLANT
SET BASIN LINEN APH (SET/KITS/TRAYS/PACK) ×1 IMPLANT
STRIP CLOSURE SKIN 1/2X4 (GAUZE/BANDAGES/DRESSINGS) IMPLANT
SUT CHROMIC 4 0 PS 2 18 (SUTURE) IMPLANT
SUT MNCRL AB 4-0 PS2 18 (SUTURE) IMPLANT
SYR CONTROL 10ML LL (SYRINGE) ×1 IMPLANT
UNDERPAD 30X36 HEAVY ABSORB (UNDERPADS AND DIAPERS) ×1 IMPLANT

## 2024-04-29 NOTE — Transfer of Care (Signed)
 Immediate Anesthesia Transfer of Care Note  Patient: [name] [name]  Procedure(s) Performed: REMOVAL, CYST, HAND (Right: Ring Finger)  Patient Location: Short Stay  Anesthesia Type:MAC  Level of Consciousness: awake, alert , oriented, and patient cooperative  Airway & Oxygen Therapy: Patient Spontanous Breathing  Post-op Assessment: Report given to RN and Post -op Vital signs reviewed and stable  Post vital signs: Reviewed and stable  Last Vitals:  Vitals Value Taken Time  BP 137/75 04/29/24 09:39  Temp 36.7 C 04/29/24 09:39  Pulse 67 04/29/24 09:39  Resp 12 04/29/24 09:39  SpO2 100 % 04/29/24 09:39    Last Pain:  Vitals:   04/29/24 0939  TempSrc: Oral  PainSc: 0-No pain      Patients Stated Pain Goal: 3 (04/29/24 0756)  Complications: No notable events documented.

## 2024-04-29 NOTE — Discharge Instructions (Signed)
  [name] Clauson A. Onesimo, MD [name] [address] [name],  [place]  [address] Phone: [phone] Fax: [phone]    POST-OPERATIVE INSTRUCTIONS   WOUND CARE You may remove your bandage on postop day 3 and get the hand wet.  No ointments or lotions to be applied to the incision.  Do not submerge the incision for 1 month.  FOLLOW-UP If you develop a Fever (>101.5), Redness or Drainage from the surgical incision site, please call our office to arrange for an evaluation. Please call the office to schedule a follow-up appointment for your incision check if you do not already have one, 7-10 days post-operatively.  IF YOU HAVE ANY QUESTIONS, PLEASE FEEL FREE TO CALL OUR OFFICE.  HELPFUL INFORMATION  You should wean off your narcotic medicines as soon as you are able.  Most patients will be off or using minimal narcotics before their first postop appointment.   You may be more comfortable sleeping in a semi-seated position the first few nights following surgery.  Keep a pillow propped under the elbow and forearm for comfort.  If you have a recliner type of chair it might be beneficial.    We suggest you use the pain medication the first night prior to going to bed, in order to ease any pain when the anesthesia wears off. You should avoid taking pain medications on an empty stomach as it will make you nauseous.  Do not drink alcoholic beverages or take illicit drugs when taking pain medications.  You may return to work/school in the next couple of days when you feel up to it. Desk work and typing is fine.  Pain medication may make you constipated.  Below are a few solutions to try in this order: Decrease the amount of pain medication if you aren't having pain. Drink lots of decaffeinated fluids. Drink prune juice and/or each dried prunes  If the first 3 don't work start with additional solutions Take Colace - an over-the-counter stool softener Take  Senokot - an over-the-counter laxative Take Miralax  - a stronger over-the-counter laxative   NARCOTIC MANAGEMENT  Per [facility] clinic policy, our goal is ensure optimal postoperative pain control with a multimodal pain management strategy.   For all [facility] patients, our goal is to wean post-operative narcotic medications by 6 weeks post-operatively.   If this is not possible due to utilization of pain medication prior to surgery, your [name] doctor will support your acute post-operative pain control for the first 6 weeks postoperatively, with a plan to transition you back to your primary pain team following that. [name] will work to ensure a Therapist, occupational.

## 2024-04-29 NOTE — Op Note (Signed)
 Orthopaedic Surgery Operative Note (CSN: [id number])  [name] [name]  [birth year] Date of Surgery: 04/29/2024   Diagnoses:  Right ring finger ganglion cyst  Procedure: Excision of cyst from right ring finger   Operative Finding Successful completion of the planned procedure.    Post-Op Diagnosis: Same Surgeons:Primary: Onesimo Oneil LABOR, MD Assistants: N/A Location: [facility] OR ROOM 4 Anesthesia: Sedation plus regional anesthesia Antibiotics: Ancef  3 g Tourniquet time:  Total Tourniquet Time Documented: Upper Arm (Right) - 16 minutes Total: Upper Arm (Right) - 16 minutes  Estimated Blood Loss: 5 cc Complications: None Specimens: None  Implants: None  Indications for Surgery:   [name] [name] is a 78 y.o. male who has a progressively worsening cyst on the dorsal aspect of the right index finger.  Cyst was overlying the PIP joint.  It was causing some discomfort.  He also did not like the way it looked.  Benefits and risks of operative and nonoperative management were discussed prior to surgery with the patient and informed consent form was completed.  Specific risks including infection, need for additional surgery, recurrence of the cyst, stiffness, bleeding, pain and more severe complications associated with anesthesia.  Elected to proceed.   Procedure:   The patient was identified properly. Informed consent was obtained and the surgical site was marked. The patient was taken to the OR where general anesthesia was induced.  The patient was positioned supine, with his hand on hand table.  The right arm was prepped and draped in the usual sterile fashion.  Timeout was performed before the beginning of the case.  Once patient was comfortable, a digital block was completed with a combination of lidocaine  and Marcaine .  A pinch test was completed prior to making incision.  Tourniquet was used for the above duration.  He received 3 g of Ancef  prior to making incision.  Cyst was  identified.  We made an incision directly over the cyst.  This was in line with the natural lines on the dorsal aspect of the PIP joint.  We incised sharply through skin.  We then dissected around the cyst.  The cyst was decompressed.  Contents were consistent with a ganglion cyst, as gelatinous material was expressed.  We then carefully controlled the cyst.  Sequentially, the cyst was released from surrounding adhesions.  The stalk was identified.  The cyst was truncated the cyst was truncated at the stalk, and then we used bipolar cautery to coagulate the cyst pocket, in order to reduce the risk of recurrence.  The finger was carefully evaluated.  There was obvious improvement in the overall cosmesis.  No remaining cyst pockets were identified.  We irrigated the wound copiously.  Incision was closed with 4-0 Chromic Gut.  Additional local anesthetic was injected to complete a digital block.  Sterile dressing was placed.  Patient was awoken taken to PACU in stable condition.   Post-operative plan:  The patient will be WBAT on the operative extremity Discharge home from the PACU once they have recovered DVT prophylaxis: Resume anticoagulation upon discharge. Pain control with PRN pain medication preferring oral medicines.   Follow up plan will be scheduled in approximately 7-10 days for incision check.

## 2024-04-29 NOTE — Anesthesia Preprocedure Evaluation (Addendum)
 Anesthesia Evaluation  Patient identified by MRN, date of birth, ID band Patient awake    Reviewed: Allergy & Precautions, NPO status , Patient's Chart, lab work & pertinent test results, reviewed documented beta blocker date and time   Airway Mallampati: II  TM Distance: >3 FB Neck ROM: Full    Dental  (+) Dental Advisory Given, Partial Upper, Missing,    Pulmonary shortness of breath, asthma , sleep apnea and Continuous Positive Airway Pressure Ventilation    Pulmonary exam normal breath sounds clear to auscultation       Cardiovascular hypertension, Pt. on medications and Pt. on home beta blockers + CAD, + Peripheral Vascular Disease and +CHF  Normal cardiovascular exam+ dysrhythmias Atrial Fibrillation  Rhythm:Regular Rate:Normal  EF 60%    Neuro/Psych  Headaches PSYCHIATRIC DISORDERS Anxiety Depression     Neuromuscular disease CVA    GI/Hepatic Neg liver ROS,GERD  ,,  Endo/Other  diabetes, Type 2, Oral Hypoglycemic Agents  Class 3 obesity  Renal/GU Renal disease (AKI)RIGHT URETERAL STONE   Prostate cancer     Musculoskeletal  (+) Arthritis ,    Abdominal   Peds  Hematology  (+) Blood dyscrasia (Plavix )   Anesthesia Other Findings Day of surgery medications reviewed with the patient.  Reproductive/Obstetrics                              Anesthesia Physical Anesthesia Plan  ASA: 3  Anesthesia Plan: General   Post-op Pain Management: Minimal or no pain anticipated   Induction: Intravenous  PONV Risk Score and Plan: Propofol  infusion  Airway Management Planned: Natural Airway and Nasal Cannula  Additional Equipment: None  Intra-op Plan:   Post-operative Plan:   Informed Consent: I have reviewed the patients History and Physical, chart, labs and discussed the procedure including the risks, benefits and alternatives for the proposed anesthesia with the patient or authorized  representative who has indicated his/her understanding and acceptance.     Dental advisory given  Plan Discussed with: CRNA  Anesthesia Plan Comments:          Anesthesia Quick Evaluation

## 2024-04-29 NOTE — Interval H&P Note (Signed)
 History and Physical Interval Note:  04/29/2024 8:52 AM  [name] [name]  has presented today for surgery, with the diagnosis of Right ring finger ganglion cyst.  The various methods of treatment have been discussed with the patient and family. After consideration of risks, benefits and other options for treatment, the patient has consented to  Procedure(s) with comments: REMOVAL, CYST, HAND (Right) - Sedation and digital block; right ring finger as a surgical intervention.  The patient's history has been reviewed, patient examined, no change in status, stable for surgery.  I have reviewed the patient's chart and labs.  Questions were answered to the patient's satisfaction.     [name]

## 2024-04-29 NOTE — Anesthesia Postprocedure Evaluation (Signed)
 Anesthesia Post Note  Patient: [name] [name]  Procedure(s) Performed: REMOVAL, CYST, HAND (Right: Ring Finger)  Patient location during evaluation: PACU Anesthesia Type: General Level of consciousness: awake and alert Pain management: pain level controlled Vital Signs Assessment: post-procedure vital signs reviewed and stable Respiratory status: spontaneous breathing, nonlabored ventilation and respiratory function stable Cardiovascular status: blood pressure returned to baseline and stable Postop Assessment: no apparent nausea or vomiting Anesthetic complications: no   There were no known notable events for this encounter.   Last Vitals:  Vitals:   04/29/24 0756 04/29/24 0939  BP:  137/75  Pulse: 67 67  Resp: 18 12  Temp: 36.6 C 36.7 C  SpO2: 94% 100%    Last Pain:  Vitals:   04/29/24 0939  TempSrc: Oral  PainSc: 0-No pain                 Jceon Alverio L Sharon Rubis

## 2024-04-30 ENCOUNTER — Encounter (HOSPITAL_COMMUNITY): Payer: Self-pay | Admitting: Orthopedic Surgery

## 2024-05-01 DIAGNOSIS — B86 Scabies: Secondary | ICD-10-CM | POA: Diagnosis not present

## 2024-05-01 DIAGNOSIS — Z85828 Personal history of other malignant neoplasm of skin: Secondary | ICD-10-CM | POA: Diagnosis not present

## 2024-05-07 ENCOUNTER — Telehealth: Payer: Self-pay

## 2024-05-07 NOTE — Telephone Encounter (Signed)
 Received call from [facility] regarding patient's metformin  prescription.   They report that patient told them he is taking Metformin  BID, however, rx on file is for once daily dosing.   They are requesting a new prescription regardless, with clarification for once daily vs BID dosing.   Will forward to PCP.    Chiquita JAYSON English, RN

## 2024-05-08 ENCOUNTER — Ambulatory Visit (INDEPENDENT_AMBULATORY_CARE_PROVIDER_SITE_OTHER): Admitting: Orthopedic Surgery

## 2024-05-08 ENCOUNTER — Encounter: Payer: Self-pay | Admitting: Orthopedic Surgery

## 2024-05-08 DIAGNOSIS — M67441 Ganglion, right hand: Secondary | ICD-10-CM

## 2024-05-08 NOTE — Progress Notes (Signed)
 Orthopaedic Postop Note  Assessment: [name] [name] is a 78 y.o. male s/p excision of cyst from dorsal right ring finger  DOS: 04/29/2024  Plan:  Incision is healing appropriately.  Chromic gut sutures remain in place.  Advised him to keep this clean.  He can use some triple antibiotic ointment, and Band-Aid as needed.  Sutures can be trimmed, otherwise they will dissolve.  He will follow-up if he has any further issues.  Follow-up: Return if symptoms worsen or fail to improve. XR at next visit:   Subjective:  Chief Complaint  Patient presents with   Routine Post Op    R ring finger cyst removal, doing well DOS 04/29/24    History of Present Illness: [name] [name] is a 78 y.o. male who presents following the above stated procedure.  Surgery was last week.  He has done well.  He did not take any pain medicines.  He is not having any pain.  He notices tightness around the finger when he tries to make a fist.  Otherwise, he is not having any issues.  Review of Systems: No fevers or chills No numbness or tingling No Chest Pain No shortness of breath   Objective: There were no vitals taken for this visit.  Physical Exam:  Alert and oriented.  No acute distress.  Incision on the dorsal aspect of the right ring finger is healing.  No surrounding erythema or drainage.  He is able to make a fist.  Chromic Gut sutures are still intact.  Fingers are warm and well-perfused.  IMAGING: I personally ordered and reviewed the following images:  No new imaging obtained today.  [name], MD 05/08/2024 11:49 AM

## 2024-05-10 ENCOUNTER — Other Ambulatory Visit: Payer: Self-pay | Admitting: Family Medicine

## 2024-05-10 DIAGNOSIS — E1169 Type 2 diabetes mellitus with other specified complication: Secondary | ICD-10-CM

## 2024-05-10 MED ORDER — METFORMIN HCL 1000 MG PO TABS: 1000.0000 mg | ORAL_TABLET | Freq: Every day | ORAL | 3 refills | Status: DC

## 2024-05-13 ENCOUNTER — Other Ambulatory Visit (INDEPENDENT_AMBULATORY_CARE_PROVIDER_SITE_OTHER): Payer: Self-pay | Admitting: Family Medicine

## 2024-05-13 ENCOUNTER — Other Ambulatory Visit: Payer: Self-pay | Admitting: Family Medicine

## 2024-05-13 DIAGNOSIS — E1169 Type 2 diabetes mellitus with other specified complication: Secondary | ICD-10-CM

## 2024-05-13 DIAGNOSIS — I10 Essential (primary) hypertension: Secondary | ICD-10-CM

## 2024-05-14 ENCOUNTER — Other Ambulatory Visit (INDEPENDENT_AMBULATORY_CARE_PROVIDER_SITE_OTHER): Payer: Self-pay | Admitting: Family Medicine

## 2024-05-14 DIAGNOSIS — E1169 Type 2 diabetes mellitus with other specified complication: Secondary | ICD-10-CM

## 2024-05-16 ENCOUNTER — Ambulatory Visit

## 2024-05-16 VITALS — Ht 73.0 in | Wt 296.0 lb

## 2024-05-16 DIAGNOSIS — Z0000 Encounter for general adult medical examination without abnormal findings: Secondary | ICD-10-CM
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
## 2024-05-16 NOTE — Patient Instructions (Signed)
 [name] [name],  Thank you for taking the time for your Medicare Wellness Visit. I appreciate your continued commitment to your health goals. Please review the care plan we discussed, and feel free to reach out if I can assist you further.  Medicare recommends these wellness visits once per year to help you and your care team stay ahead of potential health issues. These visits are designed to focus on prevention, allowing your provider to concentrate on managing your acute and chronic conditions during your regular appointments.  Please note that Annual Wellness Visits do not include a physical exam. Some assessments may be limited, especially if the visit was conducted virtually. If needed, we may recommend a separate in-person follow-up with your provider.  Ongoing Care Seeing your primary care provider every 3 to 6 months helps us  monitor your health and provide consistent, personalized care.   Referrals If a referral was made during today's visit and you haven't received any updates within two weeks, please contact the referred provider directly to check on the status.  Recommended Screenings:  Health Maintenance  Topic Date Due   Zoster (Shingles) Vaccine (1 of 2) 10/30/1965   DTaP/Tdap/Td vaccine (3 - Tdap) 05/29/2018   Complete foot exam   09/14/2022   Flu Shot  03/01/2024   COVID-19 Vaccine (4 - 2025-26 season) 04/01/2024   Eye exam for diabetics  09/10/2024   Hemoglobin A1C  10/23/2024   Yearly kidney health urinalysis for diabetes  02/15/2025   Yearly kidney function blood test for diabetes  04/25/2025   Medicare Annual Wellness Visit  05/16/2025   Pneumococcal Vaccine for age over 75  Completed   Hepatitis C Screening  Completed   Meningitis B Vaccine  Aged Out   Colon Cancer Screening  Discontinued       05/16/2024    3:36 PM  Advanced Directives  Does Patient Have a Medical Advance Directive? No  Would patient like information on creating a medical advance  directive? No - Patient declined   Advance Care Planning is important because it: Ensures you receive medical care that aligns with your values, goals, and preferences. Provides guidance to your family and loved ones, reducing the emotional burden of decision-making during critical moments.  Vision: Annual vision screenings are recommended for early detection of glaucoma, cataracts, and diabetic retinopathy. These exams can also reveal signs of chronic conditions such as diabetes and high blood pressure.  Dental: Annual dental screenings help detect early signs of oral cancer, gum disease, and other conditions linked to overall health, including heart disease and diabetes.  Please see the attached documents for additional preventive care recommendations.

## 2024-05-22 DIAGNOSIS — R399 Unspecified symptoms and signs involving the genitourinary system: Secondary | ICD-10-CM | POA: Diagnosis not present

## 2024-05-22 DIAGNOSIS — N3946 Mixed incontinence: Secondary | ICD-10-CM | POA: Diagnosis not present

## 2024-05-22 DIAGNOSIS — C61 Malignant neoplasm of prostate: Secondary | ICD-10-CM | POA: Diagnosis not present

## 2024-05-30 ENCOUNTER — Telehealth (INDEPENDENT_AMBULATORY_CARE_PROVIDER_SITE_OTHER): Admitting: Family Medicine

## 2024-05-30 ENCOUNTER — Other Ambulatory Visit (INDEPENDENT_AMBULATORY_CARE_PROVIDER_SITE_OTHER): Payer: Self-pay | Admitting: Family Medicine

## 2024-05-30 ENCOUNTER — Encounter (INDEPENDENT_AMBULATORY_CARE_PROVIDER_SITE_OTHER): Payer: Self-pay

## 2024-05-30 DIAGNOSIS — E559 Vitamin D deficiency, unspecified: Secondary | ICD-10-CM

## 2024-05-30 DIAGNOSIS — E1169 Type 2 diabetes mellitus with other specified complication: Secondary | ICD-10-CM

## 2024-05-30 MED ORDER — VITAMIN D (ERGOCALCIFEROL) 1.25 MG (50000 UNIT) PO CAPS: 50000.0000 [IU] | ORAL_CAPSULE | ORAL | 0 refills | Status: DC

## 2024-05-30 MED ORDER — SEMAGLUTIDE (2 MG/DOSE) 8 MG/3ML ~~LOC~~ SOPN: 2.0000 mg | PEN_INJECTOR | SUBCUTANEOUS | 0 refills | Status: DC

## 2024-05-30 NOTE — Telephone Encounter (Signed)
 10/30 Cxd pt's [facility] Video appt due to Medicare is not paying for [facility] Video appts now. Pt needs a refill of Ozempic  as he has one dose remaining.

## 2024-05-30 NOTE — Telephone Encounter (Signed)
 Spoke with patient, he just needs refills today. He is not feeling well, clinic was going to do a my chart video visit, but because of the changing of the rules of Medicare we are unable to.  We do not want patient to get a bill.    LAST APPOINTMENT DATE: 04/02/2024 NEXT APPOINTMENT DATE: not scheduled yet   [facility] - [place], [place] - [address] [place] [address] Phone: [phone] Fax: [phone]  Patient is requesting a refill of the following medications: Requested Prescriptions   Pending Prescriptions Disp Refills   Semaglutide , 2 MG/DOSE, 8 MG/3ML SOPN 3 mL 0    Sig: Inject 2 mg as directed once a week.   Vitamin D , Ergocalciferol , (DRISDOL ) 1.25 MG (50000 UNIT) CAPS capsule 7 capsule 0    Sig: Take 1 capsule (50,000 Units total) by mouth every 14 (fourteen) days.    Date last filled: 04/02/2024 Previously prescribed by Dr Verdon  Lab Results  Component Value Date   HGBA1C 7.3 (H) 04/25/2024   HGBA1C 7.9 (A) 02/16/2024   HGBA1C 7.7 (H) 11/14/2023   Lab Results  Component Value Date   LDLCALC 40 11/14/2023   CREATININE 1.21 04/25/2024   Lab Results  Component Value Date   VD25OH 68.2 11/14/2023   VD25OH 63.9 02/08/2023   VD25OH 65.0 09/21/2022    BP Readings from Last 3 Encounters:  04/29/24 137/75  04/17/24 (!) 133/94  04/02/24 139/76

## 2024-06-02 ENCOUNTER — Other Ambulatory Visit: Payer: Self-pay | Admitting: Physician Assistant

## 2024-06-03 DIAGNOSIS — Z961 Presence of intraocular lens: Secondary | ICD-10-CM | POA: Diagnosis not present

## 2024-06-03 DIAGNOSIS — H11441 Conjunctival cysts, right eye: Secondary | ICD-10-CM | POA: Diagnosis not present

## 2024-06-03 DIAGNOSIS — H0102A Squamous blepharitis right eye, upper and lower eyelids: Secondary | ICD-10-CM | POA: Diagnosis not present

## 2024-06-03 DIAGNOSIS — H0102B Squamous blepharitis left eye, upper and lower eyelids: Secondary | ICD-10-CM | POA: Diagnosis not present

## 2024-06-07 ENCOUNTER — Ambulatory Visit: Admitting: Family Medicine

## 2024-06-07 ENCOUNTER — Encounter: Payer: Self-pay | Admitting: Family Medicine

## 2024-06-07 VITALS — BP 135/79 | HR 63 | Ht 73.0 in | Wt 302.6 lb

## 2024-06-07 DIAGNOSIS — B86 Scabies: Secondary | ICD-10-CM | POA: Diagnosis not present

## 2024-06-07 DIAGNOSIS — Z0000 Encounter for general adult medical examination without abnormal findings: Secondary | ICD-10-CM

## 2024-06-07 NOTE — Assessment & Plan Note (Signed)
 He received his flu and COVID shot at the pharmacy.  I advised him to get his Tdap shot.  We updated his diabetic foot exam today.

## 2024-06-07 NOTE — Progress Notes (Signed)
    SUBJECTIVE:   CHIEF COMPLAINT / HPI:   Presents to address care gaps Still suffering from generalized skin rash.  He has been dermatology who diagnosed him with scabies and he has gone through 2 rounds of treatment with no improvement.  On his trunk, back, legs and very itchy.   PERTINENT  PMH / PSH: HTN, HFimpEF, CAD, CHF, CVA, T2DM, prostate cancer, HLD  OBJECTIVE:   BP 135/79   Pulse 63   Ht 6' 1 (1.854 m)   Wt (!) 302 lb 9.6 oz (137.3 kg)   SpO2 97%   BMI 39.92 kg/m    Gen: well appearing, NAD Skin: Flaky, erythematous rash scattered to abdomen, back, legs as below    Diabetic Foot Exam - Simple   Simple Foot Form Diabetic Foot exam was performed with the following findings: Yes 06/07/2024 10:06 AM  Visual Inspection No deformities, no ulcerations, no other skin breakdown bilaterally: Yes Sensation Testing Intact to touch and monofilament testing bilaterally: Yes Pulse Check Posterior Tibialis and Dorsalis pulse intact bilaterally: Yes Comments     ASSESSMENT/PLAN:   Assessment & Plan Scabies I advised patient to call his dermatologist back for potential additional treatment.  I suspect the scabies is not being completely treated because he has difficulty reaching his back to apply the medication.  I have placed an order for home health to see if there is any need or the nurse who could come and help him apply the medication to hopefully eradicate this rash. Healthcare maintenance He received his flu and COVID shot at the pharmacy.  I advised him to get his Tdap shot.  We updated his diabetic foot exam today.     Elyce Prescott, DO [facility]

## 2024-06-07 NOTE — Patient Instructions (Addendum)
 Good to see you today - Thank you for coming in  Things we discussed today:  Please call the dermatologist back about your rash. See you at the new year to recheck your A1c

## 2024-06-10 DIAGNOSIS — Z85828 Personal history of other malignant neoplasm of skin: Secondary | ICD-10-CM | POA: Diagnosis not present

## 2024-06-10 DIAGNOSIS — L308 Other specified dermatitis: Secondary | ICD-10-CM | POA: Diagnosis not present

## 2024-06-10 DIAGNOSIS — L0889 Other specified local infections of the skin and subcutaneous tissue: Secondary | ICD-10-CM | POA: Diagnosis not present

## 2024-06-15 DIAGNOSIS — I5032 Chronic diastolic (congestive) heart failure: Secondary | ICD-10-CM | POA: Diagnosis not present

## 2024-06-15 DIAGNOSIS — Z7902 Long term (current) use of antithrombotics/antiplatelets: Secondary | ICD-10-CM | POA: Diagnosis not present

## 2024-06-15 DIAGNOSIS — B86 Scabies: Secondary | ICD-10-CM | POA: Diagnosis not present

## 2024-06-15 DIAGNOSIS — E785 Hyperlipidemia, unspecified: Secondary | ICD-10-CM | POA: Diagnosis not present

## 2024-06-15 DIAGNOSIS — Z7984 Long term (current) use of oral hypoglycemic drugs: Secondary | ICD-10-CM | POA: Diagnosis not present

## 2024-06-15 DIAGNOSIS — I2510 Atherosclerotic heart disease of native coronary artery without angina pectoris: Secondary | ICD-10-CM | POA: Diagnosis not present

## 2024-06-15 DIAGNOSIS — Z7985 Long-term (current) use of injectable non-insulin antidiabetic drugs: Secondary | ICD-10-CM | POA: Diagnosis not present

## 2024-06-15 DIAGNOSIS — I110 Hypertensive heart disease with heart failure: Secondary | ICD-10-CM | POA: Diagnosis not present

## 2024-06-15 DIAGNOSIS — E119 Type 2 diabetes mellitus without complications: Secondary | ICD-10-CM | POA: Diagnosis not present

## 2024-06-18 DIAGNOSIS — B86 Scabies: Secondary | ICD-10-CM | POA: Diagnosis not present

## 2024-06-18 DIAGNOSIS — Z7902 Long term (current) use of antithrombotics/antiplatelets: Secondary | ICD-10-CM | POA: Diagnosis not present

## 2024-06-18 DIAGNOSIS — E785 Hyperlipidemia, unspecified: Secondary | ICD-10-CM | POA: Diagnosis not present

## 2024-06-18 DIAGNOSIS — E119 Type 2 diabetes mellitus without complications: Secondary | ICD-10-CM | POA: Diagnosis not present

## 2024-06-18 DIAGNOSIS — I110 Hypertensive heart disease with heart failure: Secondary | ICD-10-CM | POA: Diagnosis not present

## 2024-06-18 DIAGNOSIS — Z7984 Long term (current) use of oral hypoglycemic drugs: Secondary | ICD-10-CM | POA: Diagnosis not present

## 2024-06-18 DIAGNOSIS — I5032 Chronic diastolic (congestive) heart failure: Secondary | ICD-10-CM | POA: Diagnosis not present

## 2024-06-18 DIAGNOSIS — I2510 Atherosclerotic heart disease of native coronary artery without angina pectoris: Secondary | ICD-10-CM | POA: Diagnosis not present

## 2024-06-18 DIAGNOSIS — Z7985 Long-term (current) use of injectable non-insulin antidiabetic drugs: Secondary | ICD-10-CM | POA: Diagnosis not present

## 2024-06-21 ENCOUNTER — Encounter: Payer: Self-pay | Admitting: Pharmacist

## 2024-06-21 DIAGNOSIS — E119 Type 2 diabetes mellitus without complications: Secondary | ICD-10-CM | POA: Diagnosis not present

## 2024-06-21 DIAGNOSIS — B86 Scabies: Secondary | ICD-10-CM | POA: Diagnosis not present

## 2024-06-21 DIAGNOSIS — I110 Hypertensive heart disease with heart failure: Secondary | ICD-10-CM | POA: Diagnosis not present

## 2024-06-21 DIAGNOSIS — Z7984 Long term (current) use of oral hypoglycemic drugs: Secondary | ICD-10-CM | POA: Diagnosis not present

## 2024-06-21 DIAGNOSIS — Z7902 Long term (current) use of antithrombotics/antiplatelets: Secondary | ICD-10-CM | POA: Diagnosis not present

## 2024-06-21 DIAGNOSIS — Z7985 Long-term (current) use of injectable non-insulin antidiabetic drugs: Secondary | ICD-10-CM | POA: Diagnosis not present

## 2024-06-21 DIAGNOSIS — E785 Hyperlipidemia, unspecified: Secondary | ICD-10-CM | POA: Diagnosis not present

## 2024-06-21 DIAGNOSIS — I5032 Chronic diastolic (congestive) heart failure: Secondary | ICD-10-CM | POA: Diagnosis not present

## 2024-06-21 DIAGNOSIS — I2510 Atherosclerotic heart disease of native coronary artery without angina pectoris: Secondary | ICD-10-CM | POA: Diagnosis not present

## 2024-06-21 NOTE — Progress Notes (Signed)
 This patient is appearing on a report for being at risk of failing the adherence measure for cholesterol (statin), diabetes, and hypertension (ACEi/ARB) medications this calendar year.   Medication: Losartan  50 mg, Glimiperide 2 mg, Rosuvastatin  40 Last fill date: 11/11 for 30 day supply

## 2024-06-24 ENCOUNTER — Encounter (INDEPENDENT_AMBULATORY_CARE_PROVIDER_SITE_OTHER): Payer: Self-pay | Admitting: Family Medicine

## 2024-06-24 ENCOUNTER — Ambulatory Visit (INDEPENDENT_AMBULATORY_CARE_PROVIDER_SITE_OTHER): Payer: Self-pay | Admitting: Family Medicine

## 2024-06-24 VITALS — BP 117/73 | HR 64 | Temp 97.9°F | Ht 73.0 in | Wt 294.0 lb

## 2024-06-24 DIAGNOSIS — Z7985 Long-term (current) use of injectable non-insulin antidiabetic drugs: Secondary | ICD-10-CM

## 2024-06-24 DIAGNOSIS — L409 Psoriasis, unspecified: Secondary | ICD-10-CM | POA: Insufficient documentation

## 2024-06-24 DIAGNOSIS — E785 Hyperlipidemia, unspecified: Secondary | ICD-10-CM | POA: Diagnosis not present

## 2024-06-24 DIAGNOSIS — Z6838 Body mass index (BMI) 38.0-38.9, adult: Secondary | ICD-10-CM

## 2024-06-24 DIAGNOSIS — E119 Type 2 diabetes mellitus without complications: Secondary | ICD-10-CM | POA: Diagnosis not present

## 2024-06-24 DIAGNOSIS — I5032 Chronic diastolic (congestive) heart failure: Secondary | ICD-10-CM | POA: Diagnosis not present

## 2024-06-24 DIAGNOSIS — Z7902 Long term (current) use of antithrombotics/antiplatelets: Secondary | ICD-10-CM | POA: Diagnosis not present

## 2024-06-24 DIAGNOSIS — E669 Obesity, unspecified: Secondary | ICD-10-CM

## 2024-06-24 DIAGNOSIS — E1169 Type 2 diabetes mellitus with other specified complication: Secondary | ICD-10-CM | POA: Diagnosis not present

## 2024-06-24 DIAGNOSIS — E559 Vitamin D deficiency, unspecified: Secondary | ICD-10-CM | POA: Diagnosis not present

## 2024-06-24 DIAGNOSIS — I110 Hypertensive heart disease with heart failure: Secondary | ICD-10-CM | POA: Diagnosis not present

## 2024-06-24 DIAGNOSIS — B86 Scabies: Secondary | ICD-10-CM | POA: Diagnosis not present

## 2024-06-24 DIAGNOSIS — I2510 Atherosclerotic heart disease of native coronary artery without angina pectoris: Secondary | ICD-10-CM | POA: Diagnosis not present

## 2024-06-24 DIAGNOSIS — Z7984 Long term (current) use of oral hypoglycemic drugs: Secondary | ICD-10-CM | POA: Diagnosis not present

## 2024-06-24 MED ORDER — SEMAGLUTIDE (2 MG/DOSE) 8 MG/3ML ~~LOC~~ SOPN: 2.0000 mg | PEN_INJECTOR | SUBCUTANEOUS | 0 refills | Status: DC

## 2024-06-24 MED ORDER — VITAMIN D (ERGOCALCIFEROL) 1.25 MG (50000 UNIT) PO CAPS: 50000.0000 [IU] | ORAL_CAPSULE | ORAL | 0 refills | Status: DC
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
## 2024-06-25 LAB — CMP14+EGFR
ALT: 85 IU/L — ABNORMAL HIGH (ref 0–44)
AST: 42 IU/L — ABNORMAL HIGH (ref 0–40)
Albumin: 4.4 g/dL (ref 3.8–4.8)
Alkaline Phosphatase: 71 IU/L (ref 47–123)
BUN/Creatinine Ratio: 17 (ref 10–24)
BUN: 21 mg/dL (ref 8–27)
Bilirubin Total: 0.6 mg/dL (ref 0.0–1.2)
CO2: 21 mmol/L (ref 20–29)
Calcium: 9.5 mg/dL (ref 8.6–10.2)
Chloride: 97 mmol/L (ref 96–106)
Creatinine, Ser: 1.21 mg/dL (ref 0.76–1.27)
Globulin, Total: 2.1 g/dL (ref 1.5–4.5)
Glucose: 219 mg/dL — ABNORMAL HIGH (ref 70–99)
Potassium: 5 mmol/L (ref 3.5–5.2)
Sodium: 138 mmol/L (ref 134–144)
Total Protein: 6.5 g/dL (ref 6.0–8.5)
eGFR: 62 mL/min/1.73 (ref >59)

## 2024-06-25 LAB — VITAMIN D 25 HYDROXY (VIT D DEFICIENCY, FRACTURES): Vit D, 25-Hydroxy: 47.8 ng/mL (ref 30.0–100.0)

## 2024-07-03 DIAGNOSIS — Z7985 Long-term (current) use of injectable non-insulin antidiabetic drugs: Secondary | ICD-10-CM | POA: Diagnosis not present

## 2024-07-03 DIAGNOSIS — E119 Type 2 diabetes mellitus without complications: Secondary | ICD-10-CM | POA: Diagnosis not present

## 2024-07-03 DIAGNOSIS — Z7902 Long term (current) use of antithrombotics/antiplatelets: Secondary | ICD-10-CM | POA: Diagnosis not present

## 2024-07-03 DIAGNOSIS — B86 Scabies: Secondary | ICD-10-CM | POA: Diagnosis not present

## 2024-07-03 DIAGNOSIS — I110 Hypertensive heart disease with heart failure: Secondary | ICD-10-CM | POA: Diagnosis not present

## 2024-07-03 DIAGNOSIS — I5032 Chronic diastolic (congestive) heart failure: Secondary | ICD-10-CM | POA: Diagnosis not present

## 2024-07-03 DIAGNOSIS — I2510 Atherosclerotic heart disease of native coronary artery without angina pectoris: Secondary | ICD-10-CM | POA: Diagnosis not present

## 2024-07-03 DIAGNOSIS — E785 Hyperlipidemia, unspecified: Secondary | ICD-10-CM | POA: Diagnosis not present

## 2024-07-03 DIAGNOSIS — Z7984 Long term (current) use of oral hypoglycemic drugs: Secondary | ICD-10-CM | POA: Diagnosis not present

## 2024-07-05 DIAGNOSIS — Z7902 Long term (current) use of antithrombotics/antiplatelets: Secondary | ICD-10-CM | POA: Diagnosis not present

## 2024-07-05 DIAGNOSIS — E119 Type 2 diabetes mellitus without complications: Secondary | ICD-10-CM | POA: Diagnosis not present

## 2024-07-05 DIAGNOSIS — Z7985 Long-term (current) use of injectable non-insulin antidiabetic drugs: Secondary | ICD-10-CM | POA: Diagnosis not present

## 2024-07-05 DIAGNOSIS — I5032 Chronic diastolic (congestive) heart failure: Secondary | ICD-10-CM | POA: Diagnosis not present

## 2024-07-05 DIAGNOSIS — E785 Hyperlipidemia, unspecified: Secondary | ICD-10-CM | POA: Diagnosis not present

## 2024-07-05 DIAGNOSIS — B86 Scabies: Secondary | ICD-10-CM | POA: Diagnosis not present

## 2024-07-05 DIAGNOSIS — I110 Hypertensive heart disease with heart failure: Secondary | ICD-10-CM | POA: Diagnosis not present

## 2024-07-05 DIAGNOSIS — Z7984 Long term (current) use of oral hypoglycemic drugs: Secondary | ICD-10-CM | POA: Diagnosis not present

## 2024-07-05 DIAGNOSIS — I2510 Atherosclerotic heart disease of native coronary artery without angina pectoris: Secondary | ICD-10-CM | POA: Diagnosis not present

## 2024-07-08 ENCOUNTER — Other Ambulatory Visit: Payer: Self-pay | Admitting: Family Medicine

## 2024-07-08 DIAGNOSIS — E7849 Other hyperlipidemia: Secondary | ICD-10-CM

## 2024-07-12 ENCOUNTER — Encounter: Payer: Self-pay | Admitting: Pharmacist

## 2024-07-12 NOTE — Progress Notes (Signed)
 This patient is appearing on a report for being at risk of failing the adherence measure for hypertension (ACEi/ARB) medications this calendar year.   Medication: losartan  50mg  Last fill date: 12/10 for 30 day supply  Reviewed medication indication, dosing, and goals of therapy.

## 2024-07-30 ENCOUNTER — Other Ambulatory Visit: Payer: Self-pay | Admitting: Family Medicine

## 2024-07-30 DIAGNOSIS — F3289 Other specified depressive episodes: Secondary | ICD-10-CM

## 2024-08-09 ENCOUNTER — Other Ambulatory Visit: Payer: Self-pay | Admitting: Family Medicine

## 2024-08-09 DIAGNOSIS — I10 Essential (primary) hypertension: Secondary | ICD-10-CM

## 2024-08-20 ENCOUNTER — Ambulatory Visit (INDEPENDENT_AMBULATORY_CARE_PROVIDER_SITE_OTHER): Admitting: Family Medicine

## 2024-08-20 ENCOUNTER — Encounter (INDEPENDENT_AMBULATORY_CARE_PROVIDER_SITE_OTHER): Payer: Self-pay | Admitting: Family Medicine

## 2024-08-20 VITALS — BP 134/78 | HR 70 | Temp 98.3°F | Ht 73.0 in | Wt 295.0 lb

## 2024-08-20 DIAGNOSIS — E559 Vitamin D deficiency, unspecified: Secondary | ICD-10-CM | POA: Diagnosis not present

## 2024-08-20 DIAGNOSIS — E119 Type 2 diabetes mellitus without complications: Secondary | ICD-10-CM | POA: Diagnosis not present

## 2024-08-20 DIAGNOSIS — R7989 Other specified abnormal findings of blood chemistry: Secondary | ICD-10-CM

## 2024-08-20 DIAGNOSIS — Z7985 Long-term (current) use of injectable non-insulin antidiabetic drugs: Secondary | ICD-10-CM | POA: Diagnosis not present

## 2024-08-20 DIAGNOSIS — Z6838 Body mass index (BMI) 38.0-38.9, adult: Secondary | ICD-10-CM | POA: Diagnosis not present

## 2024-08-20 DIAGNOSIS — Z7984 Long term (current) use of oral hypoglycemic drugs: Secondary | ICD-10-CM | POA: Diagnosis not present

## 2024-08-20 DIAGNOSIS — E1169 Type 2 diabetes mellitus with other specified complication: Secondary | ICD-10-CM

## 2024-08-20 MED ORDER — SEMAGLUTIDE (2 MG/DOSE) 8 MG/3ML ~~LOC~~ SOPN: 2.0000 mg | PEN_INJECTOR | SUBCUTANEOUS | 0 refills | Status: AC

## 2024-08-20 MED ORDER — METFORMIN HCL 1000 MG PO TABS: 1000.0000 mg | ORAL_TABLET | Freq: Two times a day (BID) | ORAL | 0 refills | Status: AC

## 2024-08-20 MED ORDER — VITAMIN D (ERGOCALCIFEROL) 1.25 MG (50000 UNIT) PO CAPS: 50000.0000 [IU] | ORAL_CAPSULE | ORAL | 0 refills | Status: AC

## 2024-08-20 NOTE — Progress Notes (Signed)
 "  Office: [phone]  /  Fax: [phone]  WEIGHT SUMMARY AND BIOMETRICS  Anthropometric Measurements Height: 6' 1 (1.854 m) Weight: 295 lb (133.8 kg) BMI (Calculated): 38.93 Weight at Last Visit: 294 lb Weight Lost Since Last Visit: 0 Weight Gained Since Last Visit: 1 lb Starting Weight: 340 lb Total Weight Loss (lbs): 45 lb (20.4 kg) Peak Weight: 353 lb   Body Composition  Body Fat %: 40.4 % Fat Mass (lbs): 119.4 lbs Muscle Mass (lbs): 167.4 lbs Total Body Water  (lbs): 132 lbs Visceral Fat Rating : 28   Other Clinical Data Fasting: no Labs: no Today's Visit #: 110 Starting Date: 12/28/16    Chief Complaint: OBESITY    History of Present Illness [name] [name] is a 79 year old male with obesity and type 2 diabetes who presents for obesity treatment.  He has been adhering to a category three eating plan approximately 85% of the time and engages in physical activity by walking 25 to 30 minutes four days a week. Despite these efforts, he has gained one pound over the last two months, which he attributes to the holiday season. He is currently on Ozempic  2 mg weekly for his type 2 diabetes, which has also aided in weight management.  His type 2 diabetes management includes diet, exercise, and weight loss, supplemented by medication. His most recent A1c in November was 7.3, an improvement from 7.7 in April of the previous year, but still above the target. He has experienced financial difficulties affording his medications, particularly the GLP-1 receptor agonist, which has been most beneficial for his glucose control and weight loss. He is also on glimepiride  2 mg in the morning and metformin  once daily. He has not been able to start his Jardiance  from cardiology due to delays in his patient assistance application  His vitamin D  level was 47.8 in November, close to the target, and he is on ergocalciferol  50,000 IU weekly. His liver enzymes were elevated in  November, with an AST of 42 and ALT of 85, which have increased since April. His glucose level was 219 at that time.  He has a history of severe itching, initially thought to be scabies, but later diagnosed as psoriasis in addition to scabies. Treatment with a topical medication provided significant relief, allowing him to sleep well for the first time in nights. He received home care for application of the medication, which he found very helpful.  He recently took a two-week vacation to [place] , which he enjoyed with family. He has a supportive brother who encourages him to seek help when needed. He has adjusted his diet to include high-protein meals and is cautious with his food choices, especially avoiding foods that could exacerbate his diverticulitis. He is trying to eat adequate protein but his levels are still lower than ideal, but better than previously.      PHYSICAL EXAM:  Blood pressure 134/78, pulse 70, temperature 98.3 F (36.8 C), height 6' 1 (1.854 m), weight 295 lb (133.8 kg), SpO2 96%. Body mass index is 38.92 kg/m.  DIAGNOSTIC DATA REVIEWED BY MYSELF TODAY:  BMET    Component Value Date/Time   NA 138 06/24/2024 0810   K 5.0 06/24/2024 0810   CL 97 06/24/2024 0810   CO2 21 06/24/2024 0810   GLUCOSE 219 (H) 06/24/2024 0810   GLUCOSE 159 (H) 04/25/2024 0825   BUN 21 06/24/2024 0810   CREATININE 1.21 06/24/2024 0810   CREATININE 1.16 04/29/2016 0853   CALCIUM   9.5 06/24/2024 0810   GFRNONAA >60 04/25/2024 0825   GFRNONAA 64 04/29/2016 0853   GFRAA 64 09/16/2020 1206   GFRAA 74 04/29/2016 0853   Lab Results  Component Value Date   HGBA1C 7.3 (H) 04/25/2024   HGBA1C 5.6 07/15/2014   Lab Results  Component Value Date   INSULIN  13.9 11/14/2023   INSULIN  22.9 12/28/2016   Lab Results  Component Value Date   TSH 1.430 11/14/2023   CBC    Component Value Date/Time   WBC 5.5 04/25/2024 0825   RBC 4.93 04/25/2024 0825   HGB 14.3 04/25/2024 0825   HGB 14.4  11/14/2023 0800   HCT 44.3 04/25/2024 0825   HCT 44.2 11/14/2023 0800   PLT 246 04/25/2024 0825   PLT 268 11/14/2023 0800   MCV 89.9 04/25/2024 0825   MCV 90 11/14/2023 0800   MCH 29.0 04/25/2024 0825   MCHC 32.3 04/25/2024 0825   RDW 15.4 04/25/2024 0825   RDW 13.9 11/14/2023 0800   Iron Studies No results found for: IRON, TIBC, FERRITIN, IRONPCTSAT Lipid Panel     Component Value Date/Time   CHOL 95 (L) 11/14/2023 0800   TRIG 92 11/14/2023 0800   HDL 37 (L) 11/14/2023 0800   CHOLHDL 3.0 04/18/2022 1020   CHOLHDL 5.2 (H) 01/26/2016 1111   VLDL 46 (H) 01/26/2016 1111   LDLCALC 40 11/14/2023 0800   LDLDIRECT 148 (H) 11/15/2007 1709   Hepatic Function Panel     Component Value Date/Time   PROT 6.5 06/24/2024 0810   ALBUMIN 4.4 06/24/2024 0810   AST 42 (H) 06/24/2024 0810   ALT 85 (H) 06/24/2024 0810   ALKPHOS 71 06/24/2024 0810   BILITOT 0.6 06/24/2024 0810      Component Value Date/Time   TSH 1.430 11/14/2023 0800   Nutritional Lab Results  Component Value Date   VD25OH 47.8 06/24/2024   VD25OH 68.2 11/14/2023   VD25OH 63.9 02/08/2023     Assessment and Plan Assessment & Plan Morbid obesity Managed with a category three eating plan, followed 85% of the time, and regular exercise (25-30 minutes, 4 days a week). Weight gain of 1 pound over the last two months, less than average for the holidays. Current weight management strategies are effective. - Continue category three eating plan - Continue regular exercise regimen - Will continue to follow closely  Type 2 diabetes mellitus Managed with diet, exercise, weight loss, and medications including Ozempic  1 mg weekly, glimepiride  2 mg daily, and metformin  once daily. Recent A1c of 7.3, improved from 7.7 in April, but still above goal of <7.0. Glucose level of 219 mg/dL indicates uncontrolled glucose. Financial constraints affect medication adherence, particularly Ozempic . Increasing metformin  to twice daily  is expected to improve glucose control. Jardiance  not yet started due to insurance and paperwork issues. Although he is prescribed ozempic  2 mg weekly, he has been taking 1/2 dose so that he can stretch out the cost of his medications, especially with the start of the new year and resetting of his deductibles. - Increased metformin  to 1000 mg twice daily - Continue Ozempic  1 mg weekly for now, will increase this next if possible - Continue glimepiride  2 mg daily - Will work on obtaining Jardiance  through patient assistance program - Will recheck A1c in February  Vitamin D  deficiency Managed with ergocalciferol  50,000 IU weekly. Recent vitamin D  level of 47.8, close to goal. - Continue ergocalciferol  50,000 IU weekly  Elevated liver enzymes Recent labs show elevated liver enzymes (  AST 42, ALT 85), increased since April. 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 No jaundice or abdominal pain. - Will recheck liver enzymes in February - Continue working on diet and exercise to help improve fatty liver      Patients who are on anti-obesity medications are counseled on the importance of maintaining healthy lifestyle habits, including balanced nutrition, regular physical activity, and behavioral modifications,  Medication is an adjunct to, not a replacement for, lifestyle changes and that the long-term success and weight maintenance depend on continued adherence to these strategies.   [name] [name] was informed of the importance of frequent follow up visits to maximize his success with intensive lifestyle modifications for his obesity and obesity related health conditions as recommended by USPSTF and CMS guidelines  Louann Penton, MD   "

## 2024-08-29 ENCOUNTER — Other Ambulatory Visit: Payer: Self-pay | Admitting: Cardiovascular Disease

## 2024-09-17 ENCOUNTER — Ambulatory Visit (INDEPENDENT_AMBULATORY_CARE_PROVIDER_SITE_OTHER): Admitting: Family Medicine

## 2024-10-07 ENCOUNTER — Ambulatory Visit: Admitting: Family Medicine

## 2024-10-09 ENCOUNTER — Ambulatory Visit: Admitting: Family Medicine

## 2024-10-14 ENCOUNTER — Ambulatory Visit (INDEPENDENT_AMBULATORY_CARE_PROVIDER_SITE_OTHER): Admitting: Family Medicine
